# Patient Record
Sex: Female | Born: 1940
Health system: Southern US, Community
[De-identification: ages and names within clinical notes are randomized; demographics above are authoritative.]

## PROBLEM LIST (undated history)

## (undated) ENCOUNTER — Emergency Department (HOSPITAL_COMMUNITY): Disposition: A | Discharge status: Other Acute Inpatient Hospital | Payer: Medicare Other

## (undated) ENCOUNTER — Emergency Department (HOSPITAL_COMMUNITY): Discharge status: Against Medical Advice | Payer: Medicare Other

## (undated) DIAGNOSIS — E119 Type 2 diabetes mellitus without complications: Secondary | ICD-10-CM

## (undated) DIAGNOSIS — E871 Hypo-osmolality and hyponatremia: Secondary | ICD-10-CM

## (undated) DIAGNOSIS — I350 Nonrheumatic aortic (valve) stenosis: Secondary | ICD-10-CM

## (undated) DIAGNOSIS — E785 Hyperlipidemia, unspecified: Secondary | ICD-10-CM

## (undated) DIAGNOSIS — H269 Unspecified cataract: Secondary | ICD-10-CM

## (undated) DIAGNOSIS — D649 Anemia, unspecified: Secondary | ICD-10-CM

## (undated) DIAGNOSIS — I10 Essential (primary) hypertension: Secondary | ICD-10-CM

## (undated) DIAGNOSIS — I2510 Atherosclerotic heart disease of native coronary artery without angina pectoris: Secondary | ICD-10-CM

## (undated) DIAGNOSIS — R531 Weakness: Secondary | ICD-10-CM

## (undated) DIAGNOSIS — Z8679 Personal history of other diseases of the circulatory system: Secondary | ICD-10-CM

## (undated) DIAGNOSIS — H9190 Unspecified hearing loss, unspecified ear: Secondary | ICD-10-CM

## (undated) DIAGNOSIS — M1990 Unspecified osteoarthritis, unspecified site: Secondary | ICD-10-CM

## (undated) HISTORY — DX: Atherosclerotic heart disease of native coronary artery without angina pectoris: I25.10

## (undated) HISTORY — PX: KYPHOPLASTY: SHX5884

## (undated) HISTORY — DX: Personal history of other diseases of the circulatory system: Z86.79

---

## 1998-11-25 ENCOUNTER — Encounter: Payer: Self-pay | Admitting: Emergency Medicine

## 1998-11-25 ENCOUNTER — Emergency Department (HOSPITAL_COMMUNITY): Admission: EM | Admit: 1998-11-25 | Discharge: 1998-11-25 | Payer: Self-pay | Admitting: Emergency Medicine

## 1998-11-26 ENCOUNTER — Encounter: Payer: Self-pay | Admitting: Emergency Medicine

## 1998-11-30 ENCOUNTER — Ambulatory Visit (HOSPITAL_COMMUNITY): Admission: RE | Admit: 1998-11-30 | Discharge: 1998-11-30 | Payer: Self-pay | Admitting: *Deleted

## 1998-11-30 ENCOUNTER — Encounter: Payer: Self-pay | Admitting: *Deleted

## 2002-05-05 ENCOUNTER — Encounter: Admission: RE | Admit: 2002-05-05 | Discharge: 2002-05-05 | Payer: Self-pay | Admitting: Internal Medicine

## 2002-05-05 ENCOUNTER — Encounter: Payer: Self-pay | Admitting: Internal Medicine

## 2002-05-13 ENCOUNTER — Encounter: Payer: Self-pay | Admitting: *Deleted

## 2002-05-13 ENCOUNTER — Encounter: Admission: RE | Admit: 2002-05-13 | Discharge: 2002-05-13 | Payer: Self-pay | Admitting: *Deleted

## 2002-09-09 ENCOUNTER — Encounter: Payer: Self-pay | Admitting: Emergency Medicine

## 2002-09-09 ENCOUNTER — Emergency Department (HOSPITAL_COMMUNITY): Admission: EM | Admit: 2002-09-09 | Discharge: 2002-09-10 | Payer: Self-pay | Admitting: Emergency Medicine

## 2006-02-03 ENCOUNTER — Other Ambulatory Visit: Admission: RE | Admit: 2006-02-03 | Discharge: 2006-02-03 | Payer: Self-pay | Admitting: Internal Medicine

## 2006-02-11 ENCOUNTER — Encounter: Admission: RE | Admit: 2006-02-11 | Discharge: 2006-02-11 | Payer: Self-pay | Admitting: Internal Medicine

## 2007-04-30 ENCOUNTER — Encounter: Admission: RE | Admit: 2007-04-30 | Discharge: 2007-04-30 | Payer: Self-pay | Admitting: Internal Medicine

## 2008-05-17 ENCOUNTER — Encounter: Admission: RE | Admit: 2008-05-17 | Discharge: 2008-05-17 | Payer: Self-pay | Admitting: Internal Medicine

## 2009-05-31 ENCOUNTER — Encounter: Admission: RE | Admit: 2009-05-31 | Discharge: 2009-05-31 | Payer: Self-pay | Admitting: Internal Medicine

## 2012-10-19 ENCOUNTER — Other Ambulatory Visit: Payer: Self-pay | Admitting: Internal Medicine

## 2012-10-19 DIAGNOSIS — Z1231 Encounter for screening mammogram for malignant neoplasm of breast: Secondary | ICD-10-CM

## 2012-11-30 ENCOUNTER — Ambulatory Visit
Admission: RE | Admit: 2012-11-30 | Discharge: 2012-11-30 | Disposition: A | Discharge status: Home / Self Care | Payer: Medicare Other | Source: Ambulatory Visit | Attending: Internal Medicine | Admitting: Internal Medicine

## 2012-11-30 ENCOUNTER — Other Ambulatory Visit: Payer: Self-pay | Admitting: Internal Medicine

## 2012-11-30 DIAGNOSIS — Z1231 Encounter for screening mammogram for malignant neoplasm of breast: Secondary | ICD-10-CM

## 2013-05-14 ENCOUNTER — Other Ambulatory Visit: Payer: Self-pay | Admitting: Internal Medicine

## 2013-05-14 DIAGNOSIS — M545 Low back pain: Secondary | ICD-10-CM

## 2013-05-18 ENCOUNTER — Ambulatory Visit
Admission: RE | Admit: 2013-05-18 | Discharge: 2013-05-18 | Disposition: A | Discharge status: Home / Self Care | Payer: Medicare Other | Source: Ambulatory Visit | Attending: Internal Medicine | Admitting: Internal Medicine

## 2013-05-18 DIAGNOSIS — M545 Low back pain, unspecified: Secondary | ICD-10-CM

## 2013-06-04 ENCOUNTER — Other Ambulatory Visit (HOSPITAL_COMMUNITY): Payer: Self-pay | Admitting: Internal Medicine

## 2013-06-04 DIAGNOSIS — IMO0002 Reserved for concepts with insufficient information to code with codable children: Secondary | ICD-10-CM

## 2013-06-04 DIAGNOSIS — M549 Dorsalgia, unspecified: Secondary | ICD-10-CM

## 2013-06-09 ENCOUNTER — Ambulatory Visit (HOSPITAL_COMMUNITY)
Admission: RE | Admit: 2013-06-09 | Discharge: 2013-06-09 | Disposition: A | Discharge status: Home / Self Care | Payer: Medicare Other | Source: Ambulatory Visit | Attending: Internal Medicine | Admitting: Internal Medicine

## 2013-06-09 DIAGNOSIS — M549 Dorsalgia, unspecified: Secondary | ICD-10-CM

## 2013-06-09 DIAGNOSIS — IMO0002 Reserved for concepts with insufficient information to code with codable children: Secondary | ICD-10-CM

## 2013-07-07 ENCOUNTER — Other Ambulatory Visit (HOSPITAL_COMMUNITY): Payer: Self-pay | Admitting: Interventional Radiology

## 2013-07-07 DIAGNOSIS — M549 Dorsalgia, unspecified: Secondary | ICD-10-CM

## 2013-07-07 DIAGNOSIS — IMO0002 Reserved for concepts with insufficient information to code with codable children: Secondary | ICD-10-CM

## 2013-07-08 ENCOUNTER — Other Ambulatory Visit: Payer: Self-pay | Admitting: Radiology

## 2013-07-09 ENCOUNTER — Ambulatory Visit (HOSPITAL_COMMUNITY)
Admission: RE | Admit: 2013-07-09 | Discharge: 2013-07-09 | Disposition: A | Discharge status: Home / Self Care | Payer: Medicare Other | Source: Ambulatory Visit | Attending: Interventional Radiology | Admitting: Interventional Radiology

## 2013-07-09 ENCOUNTER — Encounter (HOSPITAL_COMMUNITY): Payer: Self-pay

## 2013-07-09 DIAGNOSIS — I10 Essential (primary) hypertension: Secondary | ICD-10-CM | POA: Insufficient documentation

## 2013-07-09 DIAGNOSIS — M810 Age-related osteoporosis without current pathological fracture: Secondary | ICD-10-CM | POA: Insufficient documentation

## 2013-07-09 DIAGNOSIS — H269 Unspecified cataract: Secondary | ICD-10-CM | POA: Insufficient documentation

## 2013-07-09 DIAGNOSIS — IMO0002 Reserved for concepts with insufficient information to code with codable children: Secondary | ICD-10-CM

## 2013-07-09 DIAGNOSIS — E119 Type 2 diabetes mellitus without complications: Secondary | ICD-10-CM | POA: Insufficient documentation

## 2013-07-09 DIAGNOSIS — M549 Dorsalgia, unspecified: Secondary | ICD-10-CM

## 2013-07-09 DIAGNOSIS — M129 Arthropathy, unspecified: Secondary | ICD-10-CM | POA: Insufficient documentation

## 2013-07-09 DIAGNOSIS — E785 Hyperlipidemia, unspecified: Secondary | ICD-10-CM | POA: Insufficient documentation

## 2013-07-09 DIAGNOSIS — M8448XA Pathological fracture, other site, initial encounter for fracture: Secondary | ICD-10-CM | POA: Insufficient documentation

## 2013-07-09 HISTORY — DX: Unspecified cataract: H26.9

## 2013-07-09 HISTORY — DX: Hyperlipidemia, unspecified: E78.5

## 2013-07-09 HISTORY — DX: Unspecified osteoarthritis, unspecified site: M19.90

## 2013-07-09 LAB — BASIC METABOLIC PANEL
BUN: 7 mg/dL (ref 6–23)
Calcium: 9.4 mg/dL (ref 8.4–10.5)
Creatinine, Ser: 0.51 mg/dL (ref 0.50–1.10)
GFR calc Af Amer: >90 mL/min (ref >90)
GFR calc non Af Amer: >90 mL/min (ref >90)

## 2013-07-09 LAB — PROTIME-INR
INR: 1.01 (ref 0.00–1.49)
Prothrombin Time: 13.1 seconds (ref 11.6–15.2)

## 2013-07-09 LAB — CBC
MCH: 27 pg (ref 26.0–34.0)
MCHC: 35.9 g/dL (ref 30.0–36.0)
RDW: 13.3 % (ref 11.5–15.5)

## 2013-07-09 MED ORDER — MIDAZOLAM HCL 2 MG/2ML IJ SOLN
INTRAMUSCULAR | Status: AC | PRN
  Administered 2013-07-09: 1 mg via INTRAVENOUS
  Administered 2013-07-09: 0.5 mg via INTRAVENOUS

## 2013-07-09 MED ORDER — SODIUM CHLORIDE 0.9 % IV SOLN: INTRAVENOUS | Status: AC

## 2013-07-09 MED ORDER — IOHEXOL 300 MG/ML  SOLN
50.0000 mL | Freq: Once | INTRAMUSCULAR | Status: AC | PRN
  Administered 2013-07-09: 1 mL

## 2013-07-09 MED ORDER — FENTANYL CITRATE 0.05 MG/ML IJ SOLN
INTRAMUSCULAR | Status: AC | PRN
  Administered 2013-07-09: 25 ug via INTRAVENOUS
  Administered 2013-07-09: 12.5 ug via INTRAVENOUS

## 2013-07-09 MED ORDER — SODIUM CHLORIDE 0.9 % IV SOLN
Freq: Once | INTRAVENOUS | Status: AC
  Administered 2013-07-09: 08:00:00 via INTRAVENOUS

## 2013-07-09 MED ORDER — DARBEPOETIN ALFA-POLYSORBATE 40 MCG/0.4ML IJ SOLN
INTRAMUSCULAR | Status: AC
  Filled 2013-07-09: qty 0.4

## 2013-07-09 MED ORDER — CEFAZOLIN SODIUM-DEXTROSE 2-3 GM-% IV SOLR
2.0000 g | Freq: Once | INTRAVENOUS | Status: AC
  Administered 2013-07-09: 2 g via INTRAVENOUS
  Filled 2013-07-09: qty 50

## 2013-07-09 NOTE — H&P (Signed)
Amy Hood is an 72 y.o. female.   Chief Complaint: back pain x 2 mos Denies injury MRI reveals new fracture Lumbar 1 Consulted with Dr Corliss Skains last week Scheduled now for L1 kyphoplasty HPI: DM; HTN; HLD; osteoporosis; cataracts  Past Medical History  Diagnosis Date  . Hypertension   . Diabetes mellitus without complication   . Arthritis   . Hyperlipidemia   . Cataracts, bilateral     No past surgical history on file.  No family history on file. Social History:  has no tobacco, alcohol, and drug history on file.  Allergies: No Known Allergies   (Not in a hospital admission)  Results for orders placed during the hospital encounter of 07/09/13 (from the past 48 hour(s))  GLUCOSE, CAPILLARY     Status: Abnormal   Collection Time    07/09/13  7:14 AM      Result Value Range   Glucose-Capillary 204 (*) 70 - 99 mg/dL   Comment 1 Notify RN     Comment 2 Documented in Chart    APTT     Status: None   Collection Time    07/09/13  7:38 AM      Result Value Range   aPTT 29  24 - 37 seconds  CBC     Status: Abnormal   Collection Time    07/09/13  7:38 AM      Result Value Range   WBC 9.0  4.0 - 10.5 K/uL   RBC 4.93  3.87 - 5.11 MIL/uL   Hemoglobin 13.3  12.0 - 15.0 g/dL   HCT 81.1  91.4 - 78.2 %   MCV 75.1 (*) 78.0 - 100.0 fL   MCH 27.0  26.0 - 34.0 pg   MCHC 35.9  30.0 - 36.0 g/dL   RDW 95.6  21.3 - 08.6 %   Platelets 280  150 - 400 K/uL  PROTIME-INR     Status: None   Collection Time    07/09/13  7:38 AM      Result Value Range   Prothrombin Time 13.1  11.6 - 15.2 seconds   INR 1.01  0.00 - 1.49   No results found.  Review of Systems  Constitutional: Negative for fever.  Respiratory: Negative for shortness of breath.   Cardiovascular: Negative for chest pain.  Gastrointestinal: Negative for nausea, vomiting and abdominal pain.  Musculoskeletal: Positive for back pain.  Neurological: Positive for weakness. Negative for headaches.    Blood pressure  177/84, pulse 82, temperature 97.7 F (36.5 C), temperature source Oral, resp. rate 20, SpO2 95.00%. Physical Exam  Constitutional: She is oriented to person, place, and time. She appears well-developed and well-nourished.  Cardiovascular: Normal rate, regular rhythm and normal heart sounds.   No murmur heard. Respiratory: Effort normal and breath sounds normal. She has no wheezes.  GI: Soft. Bowel sounds are normal. There is no tenderness.  Musculoskeletal: Normal range of motion.  Back pain  Neurological: She is alert and oriented to person, place, and time. Coordination normal.  Skin: Skin is warm and dry.  Psychiatric: She has a normal mood and affect. Her behavior is normal. Judgment and thought content normal.     Assessment/Plan Back pain x 2 mos MRI shows L1 fx Scheduled for L1 KP Pt and family aware of procedure benefits and risks and agreeable to proceed Consent signed and in chart  Holdan Stucke A 07/09/2013, 8:31 AM

## 2013-07-09 NOTE — Procedures (Signed)
S/P L1 KP 

## 2013-07-09 NOTE — ED Notes (Signed)
Patient denies pain and is resting comfortably, speaking with interpreter

## 2013-07-12 ENCOUNTER — Other Ambulatory Visit (HOSPITAL_COMMUNITY): Payer: Self-pay | Admitting: Interventional Radiology

## 2013-07-12 DIAGNOSIS — IMO0002 Reserved for concepts with insufficient information to code with codable children: Secondary | ICD-10-CM

## 2013-07-12 DIAGNOSIS — M549 Dorsalgia, unspecified: Secondary | ICD-10-CM

## 2013-07-23 ENCOUNTER — Ambulatory Visit (HOSPITAL_COMMUNITY)
Admission: RE | Admit: 2013-07-23 | Discharge: 2013-07-23 | Disposition: A | Discharge status: Home / Self Care | Payer: Medicare Other | Source: Ambulatory Visit | Attending: Interventional Radiology | Admitting: Interventional Radiology

## 2013-07-23 ENCOUNTER — Other Ambulatory Visit (HOSPITAL_COMMUNITY): Payer: Self-pay | Admitting: Interventional Radiology

## 2013-07-23 DIAGNOSIS — IMO0002 Reserved for concepts with insufficient information to code with codable children: Secondary | ICD-10-CM

## 2013-07-23 DIAGNOSIS — M549 Dorsalgia, unspecified: Secondary | ICD-10-CM

## 2013-07-23 DIAGNOSIS — M545 Low back pain, unspecified: Secondary | ICD-10-CM | POA: Insufficient documentation

## 2013-07-23 DIAGNOSIS — X58XXXA Exposure to other specified factors, initial encounter: Secondary | ICD-10-CM | POA: Insufficient documentation

## 2013-07-23 DIAGNOSIS — M5146 Schmorl's nodes, lumbar region: Secondary | ICD-10-CM | POA: Insufficient documentation

## 2013-07-23 DIAGNOSIS — M5126 Other intervertebral disc displacement, lumbar region: Secondary | ICD-10-CM | POA: Insufficient documentation

## 2013-07-23 DIAGNOSIS — S32009A Unspecified fracture of unspecified lumbar vertebra, initial encounter for closed fracture: Secondary | ICD-10-CM | POA: Insufficient documentation

## 2013-07-23 MED ORDER — OXYCODONE-ACETAMINOPHEN 5-325 MG PO TABS: 1.0000 | ORAL_TABLET | Freq: Once | ORAL | Status: DC

## 2013-07-23 MED ORDER — OXYCODONE-ACETAMINOPHEN 5-325 MG PO TABS
ORAL_TABLET | ORAL | Status: AC
  Administered 2013-07-23: 1
  Filled 2013-07-23: qty 1

## 2013-08-10 ENCOUNTER — Other Ambulatory Visit: Payer: Self-pay | Admitting: Radiology

## 2013-08-10 ENCOUNTER — Other Ambulatory Visit (HOSPITAL_COMMUNITY): Payer: Self-pay | Admitting: Interventional Radiology

## 2013-08-10 DIAGNOSIS — IMO0002 Reserved for concepts with insufficient information to code with codable children: Secondary | ICD-10-CM

## 2013-08-10 DIAGNOSIS — M549 Dorsalgia, unspecified: Secondary | ICD-10-CM

## 2013-08-13 ENCOUNTER — Ambulatory Visit (HOSPITAL_COMMUNITY)
Admission: RE | Admit: 2013-08-13 | Discharge: 2013-08-13 | Disposition: A | Discharge status: Home / Self Care | Payer: Medicare Other | Source: Ambulatory Visit | Attending: Interventional Radiology | Admitting: Interventional Radiology

## 2013-08-13 ENCOUNTER — Encounter (HOSPITAL_COMMUNITY): Payer: Self-pay

## 2013-08-13 DIAGNOSIS — I10 Essential (primary) hypertension: Secondary | ICD-10-CM | POA: Insufficient documentation

## 2013-08-13 DIAGNOSIS — E785 Hyperlipidemia, unspecified: Secondary | ICD-10-CM | POA: Insufficient documentation

## 2013-08-13 DIAGNOSIS — IMO0002 Reserved for concepts with insufficient information to code with codable children: Secondary | ICD-10-CM

## 2013-08-13 DIAGNOSIS — E119 Type 2 diabetes mellitus without complications: Secondary | ICD-10-CM | POA: Insufficient documentation

## 2013-08-13 DIAGNOSIS — M8448XA Pathological fracture, other site, initial encounter for fracture: Secondary | ICD-10-CM | POA: Insufficient documentation

## 2013-08-13 DIAGNOSIS — M549 Dorsalgia, unspecified: Secondary | ICD-10-CM

## 2013-08-13 DIAGNOSIS — M129 Arthropathy, unspecified: Secondary | ICD-10-CM | POA: Insufficient documentation

## 2013-08-13 LAB — CBC WITH DIFFERENTIAL/PLATELET
Basophils Absolute: 0 10*3/uL (ref 0.0–0.1)
Basophils Relative: 0 % (ref 0–1)
Hemoglobin: 11.8 g/dL — ABNORMAL LOW (ref 12.0–15.0)
MCHC: 34.5 g/dL (ref 30.0–36.0)
Neutro Abs: 3.7 10*3/uL (ref 1.7–7.7)
Neutrophils Relative %: 65 % (ref 43–77)
RDW: 14.2 % (ref 11.5–15.5)

## 2013-08-13 LAB — GLUCOSE, CAPILLARY: Glucose-Capillary: 93 mg/dL (ref 70–99)

## 2013-08-13 LAB — APTT: aPTT: 32 seconds (ref 24–37)

## 2013-08-13 LAB — PROTIME-INR: INR: 1 (ref 0.00–1.49)

## 2013-08-13 MED ORDER — FENTANYL CITRATE 0.05 MG/ML IJ SOLN
INTRAMUSCULAR | Status: AC
  Filled 2013-08-13: qty 2

## 2013-08-13 MED ORDER — HYDROMORPHONE HCL PF 1 MG/ML IJ SOLN
INTRAMUSCULAR | Status: DC | PRN
  Administered 2013-08-13: 0.5 mg

## 2013-08-13 MED ORDER — MIDAZOLAM HCL 2 MG/2ML IJ SOLN
INTRAMUSCULAR | Status: AC
  Filled 2013-08-13: qty 2

## 2013-08-13 MED ORDER — SODIUM CHLORIDE 0.9 % IV SOLN: INTRAVENOUS | Status: AC

## 2013-08-13 MED ORDER — CEFAZOLIN SODIUM-DEXTROSE 2-3 GM-% IV SOLR
2.0000 g | Freq: Once | INTRAVENOUS | Status: AC
  Administered 2013-08-13: 2 g via INTRAVENOUS

## 2013-08-13 MED ORDER — SODIUM CHLORIDE 0.9 % IV SOLN
Freq: Once | INTRAVENOUS | Status: AC
  Administered 2013-08-13: 11:00:00 via INTRAVENOUS

## 2013-08-13 MED ORDER — MIDAZOLAM HCL 2 MG/2ML IJ SOLN
INTRAMUSCULAR | Status: DC | PRN
  Administered 2013-08-13 (×3): 1 mg via INTRAVENOUS

## 2013-08-13 MED ORDER — CEFAZOLIN SODIUM-DEXTROSE 2-3 GM-% IV SOLR
INTRAVENOUS | Status: AC
  Filled 2013-08-13: qty 50

## 2013-08-13 MED ORDER — MIDAZOLAM HCL 2 MG/2ML IJ SOLN
INTRAMUSCULAR | Status: AC
  Filled 2013-08-13: qty 4

## 2013-08-13 MED ORDER — FENTANYL CITRATE 0.05 MG/ML IJ SOLN
INTRAMUSCULAR | Status: DC | PRN
  Administered 2013-08-13 (×2): 25 ug via INTRAVENOUS

## 2013-08-13 MED ORDER — TOBRAMYCIN SULFATE 1.2 G IJ SOLR
INTRAMUSCULAR | Status: AC
  Filled 2013-08-13: qty 1.2

## 2013-08-13 MED ORDER — HYDROMORPHONE HCL PF 1 MG/ML IJ SOLN
INTRAMUSCULAR | Status: AC
  Filled 2013-08-13: qty 2

## 2013-08-13 NOTE — Procedures (Signed)
S/P L2 KP for painful compression fracture.

## 2013-08-13 NOTE — H&P (Signed)
Amy Hood is an 72 y.o. female.   Chief Complaint: new back pain Pt has hx Lumbar 1 kyphoplasty 07/09/13 Pain was gone for over 1 week but then new onset. MRI performed 8/15 reveals new L2 fx Pt scheduled now for Lumbar #2 kyphoplasty  Also complains of dizziness She is taking Vicodin every 3 hrs for pain I explained she should be using only every 6 hrs- may be contributing to pain If dizziness continues she needs to see primary MD Husband has good understanding HPI: HTN; DM; HLD; arthritis  Past Medical History  Diagnosis Date  . Hypertension   . Diabetes mellitus without complication   . Arthritis   . Hyperlipidemia   . Cataracts, bilateral     History reviewed. No pertinent past surgical history.  No family history on file. Social History:  reports that she has never smoked. She does not have any smokeless tobacco history on file. Her alcohol and drug histories are not on file.  Allergies: No Known Allergies   (Not in a hospital admission)  No results found for this or any previous visit (from the past 48 hour(s)). No results found.  Review of Systems  Constitutional: Negative for fever and weight loss.  Respiratory: Negative for shortness of breath.   Cardiovascular: Negative for chest pain.  Gastrointestinal: Negative for nausea and vomiting.  Musculoskeletal: Positive for back pain.  Neurological: Positive for dizziness and weakness. Negative for headaches.    Blood pressure 175/64, pulse 81, temperature 98.2 F (36.8 C), temperature source Oral, resp. rate 18, weight 142 lb (64.411 kg), SpO2 100.00%. Physical Exam  Constitutional: She is oriented to person, place, and time. She appears well-nourished.  Cardiovascular: Normal rate, regular rhythm and normal heart sounds.   No murmur heard. Respiratory: Effort normal and breath sounds normal. She has no wheezes.  GI: Soft. Bowel sounds are normal. There is no tenderness.  Musculoskeletal: Normal range of  motion. She exhibits tenderness.  Low back pain  Neurological: She is alert and oriented to person, place, and time.  Speaks only [place] Used phone interpreter for consent  Skin: Skin is warm.  Psychiatric: She has a normal mood and affect. Her behavior is normal. Judgment and thought content normal.     Assessment/Plan New Low back pain x 3 weeks Hx L1 KP 07/09/13- successful- good pain relief New back pain 1 week later MRI on 8/15 revealed new L2 fx Scheduled now for L2 KP Pt and husband aware of procedure benefits and risks and agreeable to proceed Consent signed via interpreter over phone   Quoc Tome A 08/13/2013, 11:02 AM

## 2013-08-19 ENCOUNTER — Other Ambulatory Visit (HOSPITAL_COMMUNITY): Payer: Self-pay | Admitting: Interventional Radiology

## 2013-08-19 DIAGNOSIS — IMO0002 Reserved for concepts with insufficient information to code with codable children: Secondary | ICD-10-CM

## 2013-08-19 DIAGNOSIS — M549 Dorsalgia, unspecified: Secondary | ICD-10-CM

## 2013-08-27 ENCOUNTER — Ambulatory Visit (HOSPITAL_COMMUNITY)
Admission: RE | Admit: 2013-08-27 | Discharge: 2013-08-27 | Disposition: A | Discharge status: Home / Self Care | Payer: Medicare Other | Source: Ambulatory Visit | Attending: Interventional Radiology | Admitting: Interventional Radiology

## 2013-08-27 DIAGNOSIS — M549 Dorsalgia, unspecified: Secondary | ICD-10-CM

## 2013-08-27 DIAGNOSIS — IMO0002 Reserved for concepts with insufficient information to code with codable children: Secondary | ICD-10-CM

## 2013-09-18 ENCOUNTER — Encounter (HOSPITAL_COMMUNITY): Payer: Self-pay | Admitting: Emergency Medicine

## 2013-09-18 ENCOUNTER — Emergency Department (HOSPITAL_COMMUNITY): Payer: Medicare Other

## 2013-09-18 ENCOUNTER — Inpatient Hospital Stay (HOSPITAL_COMMUNITY)
Admission: EM | Admit: 2013-09-18 | Discharge: 2013-09-21 | DRG: 917 | Disposition: A | Discharge status: Home / Self Care | Payer: Medicare Other | Attending: Internal Medicine | Admitting: Internal Medicine

## 2013-09-18 DIAGNOSIS — IMO0002 Reserved for concepts with insufficient information to code with codable children: Secondary | ICD-10-CM | POA: Diagnosis present

## 2013-09-18 DIAGNOSIS — T68XXXA Hypothermia, initial encounter: Secondary | ICD-10-CM | POA: Diagnosis present

## 2013-09-18 DIAGNOSIS — S32050A Wedge compression fracture of fifth lumbar vertebra, initial encounter for closed fracture: Secondary | ICD-10-CM

## 2013-09-18 DIAGNOSIS — T50991A Poisoning by other drugs, medicaments and biological substances, accidental (unintentional), initial encounter: Principal | ICD-10-CM | POA: Diagnosis present

## 2013-09-18 DIAGNOSIS — G8929 Other chronic pain: Secondary | ICD-10-CM | POA: Diagnosis present

## 2013-09-18 DIAGNOSIS — G9341 Metabolic encephalopathy: Secondary | ICD-10-CM | POA: Diagnosis present

## 2013-09-18 DIAGNOSIS — E876 Hypokalemia: Secondary | ICD-10-CM

## 2013-09-18 DIAGNOSIS — R45851 Suicidal ideations: Secondary | ICD-10-CM

## 2013-09-18 DIAGNOSIS — X31XXXA Exposure to excessive natural cold, initial encounter: Secondary | ICD-10-CM | POA: Diagnosis present

## 2013-09-18 DIAGNOSIS — I959 Hypotension, unspecified: Secondary | ICD-10-CM | POA: Diagnosis present

## 2013-09-18 DIAGNOSIS — M8448XA Pathological fracture, other site, initial encounter for fracture: Secondary | ICD-10-CM | POA: Diagnosis present

## 2013-09-18 DIAGNOSIS — Z79899 Other long term (current) drug therapy: Secondary | ICD-10-CM

## 2013-09-18 DIAGNOSIS — M5416 Radiculopathy, lumbar region: Secondary | ICD-10-CM

## 2013-09-18 DIAGNOSIS — Z7982 Long term (current) use of aspirin: Secondary | ICD-10-CM

## 2013-09-18 DIAGNOSIS — E1169 Type 2 diabetes mellitus with other specified complication: Secondary | ICD-10-CM | POA: Diagnosis present

## 2013-09-18 DIAGNOSIS — I10 Essential (primary) hypertension: Secondary | ICD-10-CM | POA: Diagnosis present

## 2013-09-18 DIAGNOSIS — T39094A Poisoning by salicylates, undetermined, initial encounter: Secondary | ICD-10-CM | POA: Diagnosis present

## 2013-09-18 DIAGNOSIS — D649 Anemia, unspecified: Secondary | ICD-10-CM | POA: Diagnosis present

## 2013-09-18 DIAGNOSIS — M129 Arthropathy, unspecified: Secondary | ICD-10-CM | POA: Diagnosis present

## 2013-09-18 DIAGNOSIS — T394X2A Poisoning by antirheumatics, not elsewhere classified, intentional self-harm, initial encounter: Secondary | ICD-10-CM | POA: Diagnosis present

## 2013-09-18 DIAGNOSIS — E785 Hyperlipidemia, unspecified: Secondary | ICD-10-CM | POA: Diagnosis present

## 2013-09-18 DIAGNOSIS — G9340 Encephalopathy, unspecified: Secondary | ICD-10-CM | POA: Diagnosis present

## 2013-09-18 DIAGNOSIS — M810 Age-related osteoporosis without current pathological fracture: Secondary | ICD-10-CM | POA: Diagnosis present

## 2013-09-18 DIAGNOSIS — E162 Hypoglycemia, unspecified: Secondary | ICD-10-CM | POA: Diagnosis present

## 2013-09-18 DIAGNOSIS — T50992A Poisoning by other drugs, medicaments and biological substances, intentional self-harm, initial encounter: Secondary | ICD-10-CM | POA: Diagnosis present

## 2013-09-18 LAB — COMPREHENSIVE METABOLIC PANEL
ALT: 6 U/L (ref 0–35)
Albumin: 3.3 g/dL — ABNORMAL LOW (ref 3.5–5.2)
Alkaline Phosphatase: 76 U/L (ref 39–117)
BUN: 8 mg/dL (ref 6–23)
Calcium: 8.2 mg/dL — ABNORMAL LOW (ref 8.4–10.5)
Creatinine, Ser: 0.49 mg/dL — ABNORMAL LOW (ref 0.50–1.10)
GFR calc Af Amer: >90 mL/min (ref >90)
Glucose, Bld: 111 mg/dL — ABNORMAL HIGH (ref 70–99)
Potassium: 2.9 mEq/L — ABNORMAL LOW (ref 3.5–5.1)
Sodium: 137 mEq/L (ref 135–145)
Total Bilirubin: 0.2 mg/dL — ABNORMAL LOW (ref 0.3–1.2)
Total Protein: 7.8 g/dL (ref 6.0–8.3)

## 2013-09-18 LAB — URINALYSIS, ROUTINE W REFLEX MICROSCOPIC
Glucose, UA: 500 mg/dL — AB
Hgb urine dipstick: NEGATIVE
Ketones, ur: NEGATIVE mg/dL
Leukocytes, UA: NEGATIVE
Protein, ur: NEGATIVE mg/dL
pH: 6.5 (ref 5.0–8.0)

## 2013-09-18 LAB — POCT I-STAT 3, ART BLOOD GAS (G3+)
Acid-base deficit: 2 mmol/L (ref 0.0–2.0)
Bicarbonate: 22.6 mEq/L (ref 20.0–24.0)
O2 Saturation: 100 %
Patient temperature: 98.6
pH, Arterial: 7.397 (ref 7.350–7.450)

## 2013-09-18 LAB — CBC
Hemoglobin: 10.9 g/dL — ABNORMAL LOW (ref 12.0–15.0)
MCH: 26.6 pg (ref 26.0–34.0)
MCV: 76.3 fL — ABNORMAL LOW (ref 78.0–100.0)
RBC: 4.1 MIL/uL (ref 3.87–5.11)
WBC: 7.5 10*3/uL (ref 4.0–10.5)

## 2013-09-18 LAB — RAPID URINE DRUG SCREEN, HOSP PERFORMED
Barbiturates: NOT DETECTED
Cocaine: NOT DETECTED
Opiates: POSITIVE — AB
Tetrahydrocannabinol: NOT DETECTED

## 2013-09-18 LAB — URINE MICROSCOPIC-ADD ON

## 2013-09-18 LAB — GLUCOSE, CAPILLARY
Glucose-Capillary: 132 mg/dL — ABNORMAL HIGH (ref 70–99)
Glucose-Capillary: 27 mg/dL — CL (ref 70–99)
Glucose-Capillary: 82 mg/dL (ref 70–99)

## 2013-09-18 LAB — SALICYLATE LEVEL: Salicylate Lvl: 9 mg/dL (ref 2.8–20.0)

## 2013-09-18 MED ORDER — ETOMIDATE 2 MG/ML IV SOLN
INTRAVENOUS | Status: AC | PRN
  Administered 2013-09-18: 20 mg via INTRAVENOUS

## 2013-09-18 MED ORDER — FENTANYL CITRATE 0.05 MG/ML IJ SOLN
50.0000 ug | Freq: Once | INTRAMUSCULAR | Status: AC
  Administered 2013-09-18: 50 ug via INTRAVENOUS
  Filled 2013-09-18: qty 2

## 2013-09-18 MED ORDER — DEXTROSE 50 % IV SOLN
INTRAVENOUS | Status: AC
  Filled 2013-09-18: qty 50

## 2013-09-18 MED ORDER — MIDAZOLAM HCL 5 MG/ML IJ SOLN
2.0000 mg/h | INTRAMUSCULAR | Status: DC
  Administered 2013-09-18: 2 mg/h via INTRAVENOUS
  Filled 2013-09-18: qty 10

## 2013-09-18 MED ORDER — DEXTROSE 5 % IV SOLN
1.0000 g | Freq: Once | INTRAVENOUS | Status: AC
  Administered 2013-09-18: 1 g via INTRAVENOUS
  Filled 2013-09-18: qty 10

## 2013-09-18 MED ORDER — ROCURONIUM BROMIDE 50 MG/5ML IV SOLN
INTRAVENOUS | Status: AC | PRN
  Administered 2013-09-18: 100 mg via INTRAVENOUS

## 2013-09-18 MED ORDER — DEXTROSE 50 % IV SOLN
1.0000 | Freq: Once | INTRAVENOUS | Status: AC
  Administered 2013-09-18: 50 mL via INTRAVENOUS

## 2013-09-18 NOTE — ED Provider Notes (Signed)
CSN: [id number]     Arrival date & time 09/18/13  1915 History   First MD Initiated Contact with Patient 09/18/13 1919     Chief Complaint  Patient presents with  . Respiratory Distress   (Consider location/radiation/quality/duration/timing/severity/associated sxs/prior Treatment) HPI Comments: 72 y/o female with history of DM presenting with altered mental status. Per husband, she was in her normal state of health today. She recently had back surgery and had been in pain since her PCP decreased her narcotics. He had been checking on her in bed throughout the day. He checked on her and found her to be combative and stating that she wanted to die. He called EMS. She became increasingly combative and altered during transport. On arrival, she was found to be in respiratory distress and unable to protect her airway. She was intubated for airway protection by Dr. Radford Pax. Her husband is unsure if she took too much of her norco but states that the number of pills in the bottle is the same as it was Friday.   The history is provided by the spouse and the EMS personnel. No language interpreter was used.    Past Medical History  Diagnosis Date  . Hypertension   . Diabetes mellitus without complication   . Arthritis   . Hyperlipidemia   . Cataracts, bilateral    History reviewed. No pertinent past surgical history. No family history on file. History  Substance Use Topics  . Smoking status: Never Smoker   . Smokeless tobacco: Not on file  . Alcohol Use: Not on file   OB History   Grav Para Term Preterm Abortions TAB SAB Ect Mult Living                 Review of Systems  Unable to perform ROS   Allergies  Review of patient's allergies indicates no known allergies.  Home Medications   Current Outpatient Rx  Name  Route  Sig  Dispense  Refill  . ALENDRONATE SODIUM PO   Oral   Take by mouth.         Marland Kitchen aspirin EC 81 MG tablet   Oral   Take 81 mg by mouth daily.         .  Celecoxib (CELEBREX PO)   Oral   Take by mouth.         . Cholecalciferol (VITAMIN D PO)   Oral   Take by mouth.         . Cyclobenzaprine HCl (FLEXERIL PO)   Oral   Take by mouth.         Marland Kitchen GLIMEPIRIDE PO   Oral   Take by mouth.         Marland Kitchen ibuprofen (ADVIL) 200 MG tablet   Oral   Take 200 mg by mouth every 6 (six) hours as needed for pain.         Marland Kitchen lovastatin (MEVACOR) 40 MG tablet   Oral   Take 40 mg by mouth at bedtime.         Marland Kitchen OVER THE COUNTER MEDICATION      Calcium         . pioglitazone (ACTOS) 45 MG tablet   Oral   Take 45 mg by mouth daily.         . Tramadol-Acetaminophen (ULTRACET PO)   Oral   Take by mouth.          BP 173/75  Pulse 98  Temp(Src) 94.9 F (34.9  C) (Rectal)  Resp 20  SpO2 100% Physical Exam  Vitals reviewed. Constitutional: She appears well-developed and well-nourished.  HENT:  Head: Atraumatic.  Eyes:  Pupils 4 mm, sluggishly reactive, round  Cardiovascular: Normal rate, regular rhythm, normal heart sounds and intact distal pulses.   Pulmonary/Chest: Breath sounds normal. She has no wheezes. She has no rales.  Abdominal: Soft. Bowel sounds are normal. She exhibits no distension.  Neurological: She is unresponsive.  Skin: Skin is warm and dry.    ED Course  Procedures (including critical care time) Labs Review Labs Reviewed  CBC  COMPREHENSIVE METABOLIC PANEL  URINALYSIS, ROUTINE W REFLEX MICROSCOPIC  ACETAMINOPHEN LEVEL  SALICYLATE LEVEL  URINE RAPID DRUG SCREEN (HOSP PERFORMED)  TSH   Imaging Review Ct Head Wo Contrast (only If Suspected Head Trauma And/or Pt Is On Anticoagulant)  09/18/2013   CLINICAL DATA:  Respiratory distress.  EXAM: CT HEAD WITHOUT CONTRAST  TECHNIQUE: Contiguous axial images were obtained from the base of the skull through the vertex without intravenous contrast.  COMPARISON:  None.  FINDINGS: Bony calvarium appears intact. Mild bilateral ethmoid sinusitis is noted. Minimal  diffuse cortical atrophy is noted. Ventricular size is within normal limits. No mass effect or midline shift is noted. There is no evidence of mass lesion, hemorrhage or acute infarction.  IMPRESSION: Minimal diffuse cortical atrophy. Mild bilateral ethmoid sinusitis. No acute intracranial abnormality seen.   Electronically Signed   By: Roque Lias M.D.   On: 09/18/2013 21:18   Mr Lumbar Spine Wo Contrast  09/20/2013   CLINICAL DATA:  Low back pain.  EXAM: MRI LUMBAR SPINE WITHOUT CONTRAST  TECHNIQUE: Multiplanar, multisequence MR imaging was performed. No intravenous contrast was administered.  COMPARISON:  07/23/2013  FINDINGS: New marrow edema within the mildly compressed L5 vertebral body, with height loss approximately 25%. No significant bony retropulsion. No evidence of posterior element fracturing or subluxation. No significant neighboring disc edema or endplate erosion. There is heterogeneous signal intensity material in the ventral epidural space at the level of the L5, extending inferiorly to the level of the S1, likely combination of venous epidural engorgement and epidural hemorrhage.  No further compression of recently treated L2 compression fracture. Methylmethacrylate appears central to the vertebral body.  Unchanged appearance of remote L1 compression fracture, status post bone augmentation. Retropulsion is again noted, most notable central and left paracentral, with crowding of the left lateral recess and left foramen. Height loss at this level appear similar to prior, nearly 50%.  Normal conus signal and morphology. Ventral CSF is less well seen in the mid lumbar region, particularly at the level of L3 and L4, which may be related to flow disturbance or epidural venous engorgement.  No new degenerative changes. There are multiple small disc bulges and herniations, none causing advanced canal or foraminal stenosis. The most notable is again seen on the left at L5-S1, where a small disk  herniation contacts the inferior L5 nerve root within the foramen.  T2 hyperintense masses within the right kidney are stable from prior, measuring up to 2 cm in the interpolar region. These most likely represent cysts.  IMPRESSION: 1. Acute/unhealed L5 compression fracture, new from 07/23/2013. No significant height loss or bony retropulsion. 2. Treated L1 and L2 compression fractures. Bony retropulsion at L1 is stable from prior, causing left lateral recess and foraminal stenosis.   Electronically Signed   By: Tiburcio Pea M.D.   On: 09/20/2013 01:39   Dg Chest Portable 1 View  09/18/2013  CLINICAL DATA:  Intubated. Overdose.  EXAM: PORTABLE CHEST - 1 VIEW  COMPARISON:  None.  FINDINGS: Borderline enlarged cardiac silhouette. Endotracheal tube in satisfactory position. Mildly prominent interstitial markings. Minimal left basilar opacity. Diffuse osteopenia. Old, healed right rib fracture.  IMPRESSION: 1. Endotracheal tube in satisfactory position. 2. Mild left basilar atelectasis and possible aspiration pneumonitis. 3. Mild did chronic interstitial lung disease.   Electronically Signed   By: Gordan Payment M.D.   On: 09/18/2013 20:06    EKG Interpretation   None       MDM   1. Acute encephalopathy   2. Hypoglycemia   3. Hypokalemia   4. Hypotension    72 y/o female with history of DM, chronic back pain presenting with AMS. Husband called EMS after she became combative and confused. He was concerned that she overdosed on one of her medications as she was stating she wanted to die due to her pain not being controlled. He reports all of her medications are accounted for though. Mental status deteriorated during transportation. She arrived to E pod and was intubated for airway protection. BS then checked and found to be 27. D 50 given with improvement in BS and no further hypoglycemic episodes. CXR without acute cardiopulmonary disease. CT head unremarkable. Labs remarkable for hypokalemia and  bacteria in urine. UDS positive for opiates. She was very agitated in spite of sedation and able to clearly communicate. Thus she was extubated in the ED. Alert and oriented following intubation. Denies SI. Mental status likely secondary to hypoglycemia. Will admit to Medicine for further observation.   Labs and imaging reviewed in my medical decision making if ordered. Patient discussed with my attending, Dr. Blinda Leatherwood.    Abagail Kitchens, MD 09/20/13 [phone]

## 2013-09-18 NOTE — ED Notes (Signed)
Pt difficult stick. phlebotomy to get another tech to come and try.

## 2013-09-18 NOTE — ED Notes (Signed)
Husband speaking with MD. He is concerned that she took medication to end her life as she had said just previously before ems was called "I just want to die".

## 2013-09-18 NOTE — Procedures (Signed)
Extubation Procedure Note  Patient Details:   Name: Amy Hood DOB: 1941/06/22 MRN: [id number]   Airway Documentation:  Airway 7.5 mm (Active)  Secured at (cm) 23 cm 09/18/2013  7:26 PM  Measured From Lips 09/18/2013  7:26 PM  Secured Location Center 09/18/2013  7:26 PM  Secured By [employer] 09/18/2013  7:26 PM  Site Condition Dry 09/18/2013  7:26 PM    Evaluation  O2 sats: currently acceptable Complications: No apparent complications Patient did tolerate procedure well. Bilateral Breath Sounds: Rhonchi   Patient speaking in complete sentences, placed on 2L nasal cannula. RT will continue to monitor.  Derryl Harbor 09/18/2013, 11:44 PM

## 2013-09-18 NOTE — ED Notes (Signed)
Pt extubated. MD. resp tech, EMT and this RN at bedside. Pt immediately verbal. Speaking to husband stating she is cold. Pt placed on 2L [place] vs wnl.

## 2013-09-18 NOTE — ED Notes (Signed)
cxr complete

## 2013-09-18 NOTE — ED Notes (Signed)
Pt transported to CT with this RN and respiratory.

## 2013-09-18 NOTE — ED Notes (Signed)
Pt becoming more aware of intubation tube. Pt fighting to pull tube out. EMT at bedside to hold arms down. MD aware.

## 2013-09-18 NOTE — ED Provider Notes (Signed)
CSN: [id number]     Arrival date & time 09/18/13  1915 History   First MD Initiated Contact with Patient 09/18/13 1919      HPI  Patient presented in severe distress.  Patient appear to tended and not following commands.  Airway secured in order to protect from aspiration.     Review of Systems  Allergies    Home Medications    BP 173/75  Pulse 98  Resp 20  SpO2 100% Physical Exam  ED Course  INTUBATION Date/Time: 09/18/2013 7:20 PM Performed by: Nelia Shi Authorized by: Nelia Shi Consent: Verbal consent not obtained. The procedure was performed in an emergent situation. Indications: respiratory distress and airway protection Intubation method: video-assisted Patient status: paralyzed (RSI) Preoxygenation: nonrebreather mask and BVM Sedatives: etomidate Paralytic: rocuronium Laryngoscope size: Mac 4 Tube size: 7.5 mm Tube type: cuffed Number of attempts: 1 Cricoid pressure: no Cords visualized: yes Post-procedure assessment: chest rise and CO2 detector Breath sounds: equal Cuff inflated: yes ETT to lip: 23 cm Tube secured with: ETT holder Patient tolerance: Patient tolerated the procedure well with no immediate complications. Comments: Postintubation x-ray not interpreted at time of note.       MDM    Nelia Shi, MD 09/18/13 1945

## 2013-09-18 NOTE — ED Notes (Signed)
phlebotomy at bedside to draw labs.

## 2013-09-18 NOTE — ED Notes (Signed)
ems arrived on scene to pt with c/o of left hip pain. pcp had decreased her hydrocodone. Family reported she was a&o at baseline. Upon arrival to ED pt was in resp. Distress. Pt family member now report that he thinks pt overdosed on pain medication. Pt speaks [place]. Son speaks english. Pt intubated on arrival to ED.

## 2013-09-18 NOTE — ED Notes (Signed)
cbg 132 

## 2013-09-18 NOTE — ED Notes (Signed)
Pt cbg 27. Dr. Blinda Leatherwood notified.

## 2013-09-19 ENCOUNTER — Inpatient Hospital Stay (HOSPITAL_COMMUNITY): Payer: Medicare Other

## 2013-09-19 ENCOUNTER — Encounter (HOSPITAL_COMMUNITY): Payer: Self-pay | Admitting: Internal Medicine

## 2013-09-19 DIAGNOSIS — I959 Hypotension, unspecified: Secondary | ICD-10-CM

## 2013-09-19 DIAGNOSIS — E876 Hypokalemia: Secondary | ICD-10-CM

## 2013-09-19 DIAGNOSIS — G9340 Encephalopathy, unspecified: Secondary | ICD-10-CM | POA: Diagnosis present

## 2013-09-19 DIAGNOSIS — T68XXXA Hypothermia, initial encounter: Secondary | ICD-10-CM

## 2013-09-19 DIAGNOSIS — E162 Hypoglycemia, unspecified: Secondary | ICD-10-CM

## 2013-09-19 LAB — COMPREHENSIVE METABOLIC PANEL
ALT: 6 U/L (ref 0–35)
AST: 19 U/L (ref 0–37)
Albumin: 2.6 g/dL — ABNORMAL LOW (ref 3.5–5.2)
Calcium: 6.9 mg/dL — ABNORMAL LOW (ref 8.4–10.5)
GFR calc Af Amer: >90 mL/min (ref >90)
Sodium: 140 mEq/L (ref 135–145)
Total Protein: 5.9 g/dL — ABNORMAL LOW (ref 6.0–8.3)

## 2013-09-19 LAB — ABO/RH: ABO/RH(D): A POS

## 2013-09-19 LAB — TYPE AND SCREEN
ABO/RH(D): A POS
Antibody Screen: NEGATIVE

## 2013-09-19 LAB — CORTISOL: Cortisol, Plasma: 12 ug/dL

## 2013-09-19 LAB — CBC WITH DIFFERENTIAL/PLATELET
Basophils Relative: 0 % (ref 0–1)
HCT: 25.9 % — ABNORMAL LOW (ref 36.0–46.0)
Hemoglobin: 8.9 g/dL — ABNORMAL LOW (ref 12.0–15.0)
MCH: 26.3 pg (ref 26.0–34.0)
MCHC: 34.4 g/dL (ref 30.0–36.0)
Monocytes Absolute: 0.4 10*3/uL (ref 0.1–1.0)
Monocytes Relative: 5 % (ref 3–12)
Neutro Abs: 7.4 10*3/uL (ref 1.7–7.7)
Platelets: 268 10*3/uL (ref 150–400)

## 2013-09-19 LAB — CBC
Hemoglobin: 9 g/dL — ABNORMAL LOW (ref 12.0–15.0)
MCH: 26 pg (ref 26.0–34.0)
MCHC: 34.4 g/dL (ref 30.0–36.0)
MCV: 76.9 fL — ABNORMAL LOW (ref 78.0–100.0)
Platelets: 272 10*3/uL (ref 150–400)
Platelets: 255 10*3/uL (ref 150–400)
RBC: 3.46 MIL/uL — ABNORMAL LOW (ref 3.87–5.11)
RDW: 14.7 % (ref 11.5–15.5)
WBC: 8.6 10*3/uL (ref 4.0–10.5)
WBC: 7.4 10*3/uL (ref 4.0–10.5)

## 2013-09-19 LAB — GLUCOSE, CAPILLARY
Glucose-Capillary: 72 mg/dL (ref 70–99)
Glucose-Capillary: 77 mg/dL (ref 70–99)
Glucose-Capillary: 109 mg/dL — ABNORMAL HIGH (ref 70–99)
Glucose-Capillary: 119 mg/dL — ABNORMAL HIGH (ref 70–99)
Glucose-Capillary: 133 mg/dL — ABNORMAL HIGH (ref 70–99)

## 2013-09-19 LAB — MAGNESIUM: Magnesium: 1.7 mg/dL (ref 1.5–2.5)

## 2013-09-19 LAB — LACTIC ACID, PLASMA: Lactic Acid, Venous: 1.1 mmol/L (ref 0.5–2.2)

## 2013-09-19 LAB — OCCULT BLOOD X 1 CARD TO LAB, STOOL: Fecal Occult Bld: POSITIVE — AB

## 2013-09-19 LAB — MRSA PCR SCREENING: MRSA by PCR: NEGATIVE

## 2013-09-19 MED ORDER — PIPERACILLIN-TAZOBACTAM 3.375 G IVPB 30 MIN
3.3750 g | Freq: Once | INTRAVENOUS | Status: AC
  Administered 2013-09-19: 3.375 g via INTRAVENOUS
  Filled 2013-09-19: qty 50

## 2013-09-19 MED ORDER — SODIUM CHLORIDE 0.9 % IV BOLUS (SEPSIS)
1000.0000 mL | Freq: Once | INTRAVENOUS | Status: AC
  Administered 2013-09-19: 1000 mL via INTRAVENOUS

## 2013-09-19 MED ORDER — VANCOMYCIN HCL 500 MG IV SOLR
500.0000 mg | Freq: Once | INTRAVENOUS | Status: AC
  Administered 2013-09-19: 500 mg via INTRAVENOUS
  Filled 2013-09-19: qty 500

## 2013-09-19 MED ORDER — ACETAMINOPHEN 325 MG PO TABS: 650.0000 mg | ORAL_TABLET | Freq: Four times a day (QID) | ORAL | Status: DC | PRN

## 2013-09-19 MED ORDER — PANTOPRAZOLE SODIUM 40 MG PO TBEC
40.0000 mg | DELAYED_RELEASE_TABLET | Freq: Every day | ORAL | Status: DC
  Administered 2013-09-19 – 2013-09-20 (×2): 40 mg via ORAL
  Filled 2013-09-19 (×2): qty 1

## 2013-09-19 MED ORDER — POTASSIUM CHLORIDE 10 MEQ/100ML IV SOLN
10.0000 meq | INTRAVENOUS | Status: AC
  Administered 2013-09-19 (×5): 10 meq via INTRAVENOUS
  Filled 2013-09-19 (×4): qty 100

## 2013-09-19 MED ORDER — PIPERACILLIN-TAZOBACTAM 3.375 G IVPB
3.3750 g | Freq: Three times a day (TID) | INTRAVENOUS | Status: DC
  Administered 2013-09-19: 3.375 g via INTRAVENOUS
  Filled 2013-09-19 (×2): qty 50

## 2013-09-19 MED ORDER — SODIUM CHLORIDE 0.9 % IV SOLN
INTRAVENOUS | Status: AC
  Administered 2013-09-19 (×2): via INTRAVENOUS

## 2013-09-19 MED ORDER — ONDANSETRON HCL 4 MG PO TABS: 4.0000 mg | ORAL_TABLET | Freq: Four times a day (QID) | ORAL | Status: DC | PRN

## 2013-09-19 MED ORDER — HYDROCODONE-ACETAMINOPHEN 5-325 MG PO TABS
1.0000 | ORAL_TABLET | Freq: Four times a day (QID) | ORAL | Status: DC
  Administered 2013-09-19 – 2013-09-21 (×10): 1 via ORAL
  Filled 2013-09-19 (×10): qty 1

## 2013-09-19 MED ORDER — PANTOPRAZOLE SODIUM 40 MG IV SOLR
40.0000 mg | Freq: Two times a day (BID) | INTRAVENOUS | Status: DC
  Filled 2013-09-19 (×2): qty 40

## 2013-09-19 MED ORDER — VANCOMYCIN HCL 500 MG IV SOLR
500.0000 mg | Freq: Two times a day (BID) | INTRAVENOUS | Status: DC
  Administered 2013-09-19 (×2): 500 mg via INTRAVENOUS
  Filled 2013-09-19 (×3): qty 500

## 2013-09-19 MED ORDER — ACETAMINOPHEN 650 MG RE SUPP: 650.0000 mg | Freq: Four times a day (QID) | RECTAL | Status: DC | PRN

## 2013-09-19 MED ORDER — VANCOMYCIN HCL IN DEXTROSE 1-5 GM/200ML-% IV SOLN: 1000.0000 mg | Freq: Once | INTRAVENOUS | Status: DC

## 2013-09-19 MED ORDER — SODIUM CHLORIDE 0.9 % IJ SOLN
3.0000 mL | Freq: Two times a day (BID) | INTRAMUSCULAR | Status: DC
  Administered 2013-09-19 – 2013-09-21 (×6): 3 mL via INTRAVENOUS

## 2013-09-19 MED ORDER — ONDANSETRON HCL 4 MG/2ML IJ SOLN: 4.0000 mg | Freq: Four times a day (QID) | INTRAMUSCULAR | Status: DC | PRN

## 2013-09-19 NOTE — ED Notes (Signed)
Pt continues to fight vent. given 2mg  versed bolus. MD aware.

## 2013-09-19 NOTE — ED Notes (Addendum)
Pt fighting vent- Pt given 2mg  versed bolus

## 2013-09-19 NOTE — H&P (Addendum)
[facility] History and Physical  Amy Hood ZOX:[id number] DOB: [birth year] DOA: 09/18/2013  Referring physician: ER physician. PCP: Georgann Housekeeper, MD   Chief Complaint: Altered mental status.  History of pain from ER physician and patient's husband.  HPI: Amy Hood is a 72 y.o. female with known history of chronic low back pain status post kyphoplasty recently for a lumbar fracture, history of hypertension hyperlipidemia and diabetes mellitus type 2 was brought to the ER after patient was found to be acutely confused. In the ER initially patient was intubated to protect the airway. Later on as patient became more alert and awake and following commands patient was extubated. Patient in addition also was found to be hypothermic, hyperglycemia and hypokalemia on admission. Patient has been placed on bear hugger (warming blankets). Patient has been stable she was doing fine yesterday morning and both of them had breakfast together and by afternoon patient became very emotional and stated that she wanted to kill herself. Later she became altered in mental status and was brought to the ER. Once patient woke up patient states that she had taken more pills than what is required particularly 2 tablets of Fosamax, aspirin and advil. Patient's husband states that recently her pain medications were decreased. She has been having increasing low back pain probably prompting to think of suicide. Patient otherwise denies any chest pain or shortness of breath nausea vomiting abdominal pain diarrhea. Patient is still hypothermic. Hypoglycemia is improved. Patient also hypotensive. Patient has already received 2 L of normal saline bolus. Chest x-ray shows possibility of pneumonitis.  Review of Systems: As presented in the history of presenting illness, rest negative.  Past Medical History  Diagnosis Date  . Hypertension   . Diabetes mellitus without complication   . Arthritis   . Hyperlipidemia   .  Cataracts, bilateral    Past Surgical History  Procedure Laterality Date  . Kyphoplasty     Social History:  reports that she has never smoked. She does not have any smokeless tobacco history on file. She reports that she does not drink alcohol or use illicit drugs. Where does patient live  home. Can patient participate in ADLs?  Yes.  No Known Allergies  Family History:  Family History  Problem Relation Age of Onset  . Other Neg Hx       Prior to Admission medications   Medication Sig Start Date End Date Taking? Authorizing Provider  alendronate (FOSAMAX) 70 MG tablet Take 70 mg by mouth every 7 (seven) days. Take with a full glass of water on an empty stomach.   Yes Historical Provider, MD  cholecalciferol (VITAMIN D) 1000 UNITS tablet Take 1,000 Units by mouth daily.   Yes Historical Provider, MD  glimepiride (AMARYL) 4 MG tablet Take 4 mg by mouth daily before breakfast.   Yes Historical Provider, MD  HYDROcodone-acetaminophen (NORCO/VICODIN) 5-325 MG per tablet Take 1 tablet by mouth 2 (two) times daily as needed for pain.   Yes Historical Provider, MD  lovastatin (MEVACOR) 40 MG tablet Take 40 mg by mouth daily.   Yes Historical Provider, MD  Ethelda Chick (OYSTER CALCIUM) 500 MG TABS tablet Take 500 mg of elemental calcium by mouth 2 (two) times daily.   Yes Historical Provider, MD  valsartan (DIOVAN) 80 MG tablet Take 80 mg by mouth daily.   Yes Historical Provider, MD  aspirin EC 81 MG tablet Take 81 mg by mouth daily.    Historical Provider, MD  ibuprofen (ADVIL) 200 MG tablet  Take 200 mg by mouth daily.     Historical Provider, MD  pioglitazone (ACTOS) 45 MG tablet Take 45 mg by mouth daily.    Historical Provider, MD    Physical Exam: Filed Vitals:   09/19/13 0000 09/19/13 0015 09/19/13 0030 09/19/13 0045  BP: 80/47 83/42 96/44  90/48  Pulse: 59 61 66 61  Temp:      TempSrc:      Resp: 17 22 19 19   SpO2: 100% 100% 100% 100%     General:  Well-developed and  nourished.  Eyes:  Anicteric no pallor.  ENT:  No discharge from the ears eyes nose or mouth.  Neck:  No mass felt.  Cardiovascular:  S1-S2 heard.  Respiratory:  No rhonchi or crepitations.  Abdomen:  Soft nontender bowel sounds present.  Skin:  No rash.  Musculoskeletal:  No edema.  Psychiatric:  Presently patient has no suicidal thoughts.  Neurologic:  Alert awake oriented to time place and person. Moves all extremities.  Labs on Admission:  Basic Metabolic Panel:  Recent Labs Lab 09/18/13 1941  NA 137  K 2.9*  CL 104  CO2 17*  GLUCOSE 111*  BUN 8  CREATININE 0.49*  CALCIUM 8.2*   Liver Function Tests:  Recent Labs Lab 09/18/13 1941  AST 20  ALT 6  ALKPHOS 76  BILITOT 0.2*  PROT 7.8  ALBUMIN 3.3*   No results found for this basename: LIPASE, AMYLASE,  in the last 168 hours No results found for this basename: AMMONIA,  in the last 168 hours CBC: No results found for this basename: WBC, NEUTROABS, HGB, HCT, MCV, PLT,  in the last 168 hours Cardiac Enzymes: No results found for this basename: CKTOTAL, CKMB, CKMBINDEX, TROPONINI,  in the last 168 hours  BNP (last 3 results) No results found for this basename: PROBNP,  in the last 8760 hours CBG:  Recent Labs Lab 09/18/13 1952 09/18/13 2009 09/18/13 2114 09/18/13 2241  GLUCAP 27* 132* 82 132*    Radiological Exams on Admission: Ct Head Wo Contrast (only If Suspected Head Trauma And/or Pt Is On Anticoagulant)  09/18/2013   CLINICAL DATA:  Respiratory distress.  EXAM: CT HEAD WITHOUT CONTRAST  TECHNIQUE: Contiguous axial images were obtained from the base of the skull through the vertex without intravenous contrast.  COMPARISON:  None.  FINDINGS: Bony calvarium appears intact. Mild bilateral ethmoid sinusitis is noted. Minimal diffuse cortical atrophy is noted. Ventricular size is within normal limits. No mass effect or midline shift is noted. There is no evidence of mass lesion, hemorrhage or acute  infarction.  IMPRESSION: Minimal diffuse cortical atrophy. Mild bilateral ethmoid sinusitis. No acute intracranial abnormality seen.   Electronically Signed   By: Roque Lias M.D.   On: 09/18/2013 21:18   Dg Chest Portable 1 View  09/18/2013   CLINICAL DATA:  Intubated. Overdose.  EXAM: PORTABLE CHEST - 1 VIEW  COMPARISON:  None.  FINDINGS: Borderline enlarged cardiac silhouette. Endotracheal tube in satisfactory position. Mildly prominent interstitial markings. Minimal left basilar opacity. Diffuse osteopenia. Old, healed right rib fracture.  IMPRESSION: 1. Endotracheal tube in satisfactory position. 2. Mild left basilar atelectasis and possible aspiration pneumonitis. 3. Mild did chronic interstitial lung disease.   Electronically Signed   By: Gordan Payment M.D.   On: 09/18/2013 20:06     Assessment/Plan Principal Problem:   Acute encephalopathy Active Problems:   Hypothermia   Hypotension   Hypoglycemia   Suicidal behavior   Hypokalemia   1.  Acute encephalopathy - essentially resolved at this time. Probably secondary to medications causing hypoglycemia and also hypothermia. Patient states she has taken Fosamax and aspirin along with Advil. It is not sure if she had taken more of her antihypertensives and diabetic medications. Presently we will closely observe. 2. Hypoglycemia - patient is diabetic and as explained in #1 it is not sure if patient had taken more of her diabetic medications along with her other medications. Closely follow CBC every 2 hourly. 3. Hypothermia - probably precipitated by #2. Check blood cultures, TSH, cortisol levels. For now I am placing patient on empiric antibiotics. 4. Hypotension - patient is to receive third liter normal saline bolus. Check CBC. Check blood cultures. Check cortisol levels and lactic acid levels. Patient does not look septic. Not sure if patient had taken more than required antihypertensives. Patient also had received fentanyl in the ER. Closely  observe. Continue hydration. If hemoglobin is low then may have to consider GI bleed. 5. Suicidal behavior - May need psychiatric consult. Suicide precautions. Sitter. 6. Hyperlipidemia - continue statins when patient can take orally. 7. Chronic low back pain - will need titration of pain relief medications.  Addendum - patient's hemoglobin was found to be low around 8 and a repeat hemoglobin was shown to be around 9. Patient had a bowel movement earlier which was brown in color. At this time patient is having another bowel movement and a stool for occult blood has been ordered. A repeat CBC has been ordered for 12 noon.  Code Status:  Full code.  Family Communication:  Patient's husband at the bedside.  Disposition Plan:  Admit to inpatient under Dr. Christene Slates.   Shabreka Coulon N. [facility] Pager [phone].  If 7PM-7AM, please contact night-coverage [email or web address] Password [facility] 09/19/2013, 12:59 AM

## 2013-09-19 NOTE — Progress Notes (Signed)
ANTIBIOTIC CONSULT NOTE - INITIAL  Pharmacy Consult for Vancocin and Zosyn Indication: rule out pneumonia  No Known Allergies  Patient Measurements: Height: 5\' 5"  (165.1 cm) Weight: 145 lb 15.1 oz (66.2 kg) IBW/kg (Calculated) : 57  Vital Signs: Temp: 97.3 F (36.3 C) (10/12 0125) Temp src: Core (Comment) (10/12 0125) BP: 148/97 mmHg (10/12 0125) Pulse Rate: 67 (10/12 0140) Intake/Output from previous day: 10/11 0701 - 10/12 0700 In: 3000 [I.V.:3000] Out: 825 [Urine:825] Intake/Output from this shift: Total I/O In: 3000 [I.V.:3000] Out: 825 [Urine:825]  Labs:  Recent Labs  09/18/13 1941  CREATININE 0.49*   Estimated Creatinine Clearance: 56.4 ml/min (by C-G formula based on Cr of 0.49).   Microbiology: No results found for this or any previous visit (from the past 720 hour(s)).  Medical History: Past Medical History  Diagnosis Date  . Hypertension   . Diabetes mellitus without complication   . Arthritis   . Hyperlipidemia   . Cataracts, bilateral     Medications:  Prescriptions prior to admission  Medication Sig Dispense Refill  . alendronate (FOSAMAX) 70 MG tablet Take 70 mg by mouth every 7 (seven) days. Take with a full glass of water on an empty stomach.      . cholecalciferol (VITAMIN D) 1000 UNITS tablet Take 1,000 Units by mouth daily.      Marland Kitchen glimepiride (AMARYL) 4 MG tablet Take 4 mg by mouth daily before breakfast.      . HYDROcodone-acetaminophen (NORCO/VICODIN) 5-325 MG per tablet Take 1 tablet by mouth 2 (two) times daily as needed for pain.      Marland Kitchen lovastatin (MEVACOR) 40 MG tablet Take 40 mg by mouth daily.      Ethelda Chick (OYSTER CALCIUM) 500 MG TABS tablet Take 500 mg of elemental calcium by mouth 2 (two) times daily.      . valsartan (DIOVAN) 80 MG tablet Take 80 mg by mouth daily.      Marland Kitchen aspirin EC 81 MG tablet Take 81 mg by mouth daily.      Marland Kitchen ibuprofen (ADVIL) 200 MG tablet Take 200 mg by mouth daily.       . pioglitazone (ACTOS) 45  MG tablet Take 45 mg by mouth daily.       Scheduled:  . dextrose      . piperacillin-tazobactam  3.375 g Intravenous Once  . piperacillin-tazobactam (ZOSYN)  IV  3.375 g Intravenous Q8H  . potassium chloride  10 mEq Intravenous Q1 Hr x 5  . sodium chloride  3 mL Intravenous Q12H  . vancomycin  500 mg Intravenous Once  . vancomycin  500 mg Intravenous Q12H  . [DISCONTINUED] vancomycin  1,000 mg Intravenous Once   Infusions:  . sodium chloride 150 mL/hr at 09/19/13 0148    Assessment: 71yo female c/o hip pain then in respiratory distress in AMS by the time she reached ED, family reports pt may have overdosed on pain med, CXR concerning for atelectasis vs aspiration pneumonitis, to begin IV ABX.  Goal of Therapy:  Vancomycin trough level 15-20 mcg/ml  Plan:  Will begin vancomycin 500mg  IV Q12H and Zosyn 3.375g IV Q8H and monitor CBC, Cx, levels prn.  Vernard Gambles, PharmD, BCPS  09/19/2013,1:49 AM

## 2013-09-19 NOTE — ED Notes (Signed)
Versed that was not administered to pt was wasted in sink with Zack, Rn as witness.

## 2013-09-19 NOTE — ED Notes (Signed)
Pt protecting airway. Airway remains patent. Pt speaking in full sentences. Family remains at bedside.

## 2013-09-19 NOTE — ED Notes (Addendum)
Pt. Has received 2000cc NS in ED. Third fluid bolus started due to hypotension- per Dr. Kirtland Bouchard.

## 2013-09-19 NOTE — Progress Notes (Signed)
Utilization Review Completed.Amy Hood T10/11/2013  

## 2013-09-19 NOTE — Progress Notes (Addendum)
Subjective: Amy Hood is alert and conversive this morning.  Appears comfortable but she does complain of chronic low back pain with radiation down both lower extremities left greater than right.  Yesterday was a tough day for her and that she became very emotionally distraught secondary to her chronic low back pain after having a relatively normal morning, she became agitated and to more of her usual pills at all including to Fosamax aspirin and Advil.  Recently her Vicodin have been reduced from 4 times a day to 2 times a day. Uncharacteristically, she contemplated suicide yesterday because of inadequate control of her low back pain.  She was brought to the [facility] emergency room and found to be hypothermic and hypotensive and hypoglycemic.  She has responded well to 2-3 liters of normal saline and is currently alert.  She does complain of low back pain that radiates to she does not want to herself as long as she can have adequate relief of pain in her low back.  She is currently calm and thankful for medical assistance. Do not know her baseline blood pressures but she is currently quite alert with blood pressure systolic of 88 and pulse is 70 and regular and O2 saturations are excellent and respiratory rate is 22 and nonlabored. Fortunately, her husband is present to interpret, and he thinks that her rational behavior yesterday may have been secondary to withdrawal from her chronic pain medication and her chronic pain.  She denies suicidal intentions today but she desperately wants her pain controlled.  Last MRI of the lumbar spine was a couple of months ago and we will repeat it today to see if there has been a change to suggest that surgery would be helpful. Yesterday at time she felt anesthesia from the waist down over the thighs but this has improved. Urinalysis on admission revealed positive nitrite and a few bacteria but no significant pyuria.  Stool is heme positive and hemoglobin is moderately  low  Objective: Weight change:   Intake/Output Summary (Last 24 hours) at 09/19/13 0916 Last data filed at 09/19/13 0831  Gross per 24 hour  Intake   5580 ml  Output   1451 ml  Net   4129 ml   Filed Vitals:   09/19/13 0630 09/19/13 0700 09/19/13 0800 09/19/13 0817  BP: 75/36 77/30 75/27  80/36  Pulse: 64 66 73 78  Temp:      TempSrc:      Resp: 19 19 16 17   Height:      Weight:      SpO2: 100% 100% 100% 100%    General Appearance: Alert, cooperative, no distress, appears stated age Lungs: Clear to auscultation bilaterally, respirations unlabored Heart: Regular rate and rhythm, S1 and S2 normal, no murmur, rub or gallop Abdomen: Soft, non-tender, bowel sounds active all four quadrants, no masses, no organomegaly Extremities: Extremities normal, atraumatic, no cyanosis or edema Neuro: Alert and nonfocal exam, speech is normal Moves lower extremities fully in all muscle groups - sensation is present in the lower extremities to light touch  Lab Results: Results for orders placed during the hospital encounter of 09/18/13 (from the past 48 hour(s))  COMPREHENSIVE METABOLIC PANEL     Status: Abnormal   Collection Time    09/18/13  7:41 PM      Result Value Range   Sodium 137  135 - 145 mEq/L   Potassium 2.9 (*) 3.5 - 5.1 mEq/L   Chloride 104  96 - 112 mEq/L  CO2 17 (*) 19 - 32 mEq/L   Glucose, Bld 111 (*) 70 - 99 mg/dL   BUN 8  6 - 23 mg/dL   Creatinine, Ser 1.61 (*) 0.50 - 1.10 mg/dL   Calcium 8.2 (*) 8.4 - 10.5 mg/dL   Total Protein 7.8  6.0 - 8.3 g/dL   Albumin 3.3 (*) 3.5 - 5.2 g/dL   AST 20  0 - 37 U/L   ALT 6  0 - 35 U/L   Alkaline Phosphatase 76  39 - 117 U/L   Total Bilirubin 0.2 (*) 0.3 - 1.2 mg/dL   GFR calc non Af Amer >90  >90 mL/min   GFR calc Af Amer >90  >90 mL/min   Comment: (NOTE)     The eGFR has been calculated using the CKD EPI equation.     This calculation has not been validated in all clinical situations.     eGFR's persistently <90 mL/min  signify possible Chronic Kidney     Disease.  ACETAMINOPHEN LEVEL     Status: None   Collection Time    09/18/13  7:41 PM      Result Value Range   Acetaminophen (Tylenol), Serum <15.0  10 - 30 ug/mL   Comment:            THERAPEUTIC CONCENTRATIONS VARY     SIGNIFICANTLY. A RANGE OF 10-30     ug/mL MAY BE AN EFFECTIVE     CONCENTRATION FOR MANY PATIENTS.     HOWEVER, SOME ARE BEST TREATED     AT CONCENTRATIONS OUTSIDE THIS     RANGE.     ACETAMINOPHEN CONCENTRATIONS     >150 ug/mL AT 4 HOURS AFTER     INGESTION AND >50 ug/mL AT 12     HOURS AFTER INGESTION ARE     OFTEN ASSOCIATED WITH TOXIC     REACTIONS.  SALICYLATE LEVEL     Status: None   Collection Time    09/18/13  7:41 PM      Result Value Range   Salicylate Lvl 9.0  2.8 - 20.0 mg/dL  GLUCOSE, CAPILLARY     Status: Abnormal   Collection Time    09/18/13  7:52 PM      Result Value Range   Glucose-Capillary 27 (*) 70 - 99 mg/dL  URINALYSIS, ROUTINE W REFLEX MICROSCOPIC     Status: Abnormal   Collection Time    09/18/13  8:09 PM      Result Value Range   Color, Urine YELLOW  YELLOW   APPearance CLEAR  CLEAR   Specific Gravity, Urine 1.012  1.005 - 1.030   pH 6.5  5.0 - 8.0   Glucose, UA 500 (*) NEGATIVE mg/dL   Hgb urine dipstick NEGATIVE  NEGATIVE   Bilirubin Urine NEGATIVE  NEGATIVE   Ketones, ur NEGATIVE  NEGATIVE mg/dL   Protein, ur NEGATIVE  NEGATIVE mg/dL   Urobilinogen, UA 0.2  0.0 - 1.0 mg/dL   Nitrite POSITIVE (*) NEGATIVE   Leukocytes, UA NEGATIVE  NEGATIVE  URINE RAPID DRUG SCREEN (HOSP PERFORMED)     Status: Abnormal   Collection Time    09/18/13  8:09 PM      Result Value Range   Opiates POSITIVE (*) NONE DETECTED   Cocaine NONE DETECTED  NONE DETECTED   Benzodiazepines NONE DETECTED  NONE DETECTED   Amphetamines NONE DETECTED  NONE DETECTED   Tetrahydrocannabinol NONE DETECTED  NONE DETECTED   Barbiturates NONE DETECTED  NONE DETECTED   Comment:            DRUG SCREEN FOR MEDICAL PURPOSES      ONLY.  IF CONFIRMATION IS NEEDED     FOR ANY PURPOSE, NOTIFY LAB     WITHIN 5 DAYS.                LOWEST DETECTABLE LIMITS     FOR URINE DRUG SCREEN     Drug Class       Cutoff (ng/mL)     Amphetamine      1000     Barbiturate      200     Benzodiazepine   200     Tricyclics       300     Opiates          300     Cocaine          300     THC              50  GLUCOSE, CAPILLARY     Status: Abnormal   Collection Time    09/18/13  8:09 PM      Result Value Range   Glucose-Capillary 132 (*) 70 - 99 mg/dL  URINE MICROSCOPIC-ADD ON     Status: Abnormal   Collection Time    09/18/13  8:09 PM      Result Value Range   Squamous Epithelial / LPF FEW (*) RARE   WBC, UA 0-2  <3 WBC/hpf   Bacteria, UA MANY (*) RARE  POCT I-STAT 3, BLOOD GAS (G3+)     Status: Abnormal   Collection Time    09/18/13  9:05 PM      Result Value Range   pH, Arterial 7.397  7.350 - 7.450   pCO2 arterial 36.8  35.0 - 45.0 mmHg   pO2, Arterial 360.0 (*) 80.0 - 100.0 mmHg   Bicarbonate 22.6  20.0 - 24.0 mEq/L   TCO2 24  0 - 100 mmol/L   O2 Saturation 100.0     Acid-base deficit 2.0  0.0 - 2.0 mmol/L   Patient temperature 98.6 F     Collection site RADIAL, ALLEN'S TEST ACCEPTABLE     Drawn by Operator     Sample type ARTERIAL    GLUCOSE, CAPILLARY     Status: None   Collection Time    09/18/13  9:14 PM      Result Value Range   Glucose-Capillary 82  70 - 99 mg/dL  POCT I-STAT TROPONIN I     Status: None   Collection Time    09/18/13  9:18 PM      Result Value Range   Troponin i, poc 0.07  0.00 - 0.08 ng/mL   Comment 3            Comment: Due to the release kinetics of cTnI,     a negative result within the first hours     of the onset of symptoms does not rule out     myocardial infarction with certainty.     If myocardial infarction is still suspected,     repeat the test at appropriate intervals.  GLUCOSE, CAPILLARY     Status: Abnormal   Collection Time    09/18/13 10:41 PM      Result  Value Range   Glucose-Capillary 132 (*) 70 - 99 mg/dL  LACTIC ACID, PLASMA     Status: None   Collection  Time    09/19/13 12:24 AM      Result Value Range   Lactic Acid, Venous 1.1  0.5 - 2.2 mmol/L  PROTIME-INR     Status: None   Collection Time    09/19/13 12:24 AM      Result Value Range   Prothrombin Time 12.6  11.6 - 15.2 seconds   INR 0.96  0.00 - 1.49  MAGNESIUM     Status: None   Collection Time    09/19/13 12:24 AM      Result Value Range   Magnesium 1.7  1.5 - 2.5 mg/dL  MRSA PCR SCREENING     Status: None   Collection Time    09/19/13  1:27 AM      Result Value Range   MRSA by PCR NEGATIVE  NEGATIVE   Comment:            The GeneXpert MRSA Assay (FDA     approved for NASAL specimens     only), is one component of a     comprehensive MRSA colonization     surveillance program. It is not     intended to diagnose MRSA     infection nor to guide or     monitor treatment for     MRSA infections.  GLUCOSE, CAPILLARY     Status: None   Collection Time    09/19/13  2:04 AM      Result Value Range   Glucose-Capillary 97  70 - 99 mg/dL  COMPREHENSIVE METABOLIC PANEL     Status: Abnormal   Collection Time    09/19/13  3:50 AM      Result Value Range   Sodium 140  135 - 145 mEq/L   Potassium 3.6  3.5 - 5.1 mEq/L   Chloride 109  96 - 112 mEq/L   CO2 21  19 - 32 mEq/L   Glucose, Bld 80  70 - 99 mg/dL   BUN 6  6 - 23 mg/dL   Creatinine, Ser 1.19 (*) 0.50 - 1.10 mg/dL   Calcium 6.9 (*) 8.4 - 10.5 mg/dL   Total Protein 5.9 (*) 6.0 - 8.3 g/dL   Albumin 2.6 (*) 3.5 - 5.2 g/dL   AST 19  0 - 37 U/L   ALT 6  0 - 35 U/L   Alkaline Phosphatase 58  39 - 117 U/L   Total Bilirubin 0.2 (*) 0.3 - 1.2 mg/dL   GFR calc non Af Amer >90  >90 mL/min   GFR calc Af Amer >90  >90 mL/min   Comment: (NOTE)     The eGFR has been calculated using the CKD EPI equation.     This calculation has not been validated in all clinical situations.     eGFR's persistently <90 mL/min signify  possible Chronic Kidney     Disease.  CBC WITH DIFFERENTIAL     Status: Abnormal   Collection Time    09/19/13  3:50 AM      Result Value Range   WBC 9.3  4.0 - 10.5 K/uL   RBC 3.39 (*) 3.87 - 5.11 MIL/uL   Hemoglobin 8.9 (*) 12.0 - 15.0 g/dL   Comment: DELTA CHECK NOTED     REPEATED TO VERIFY   HCT 25.9 (*) 36.0 - 46.0 %   MCV 76.4 (*) 78.0 - 100.0 fL   MCH 26.3  26.0 - 34.0 pg   MCHC 34.4  30.0 - 36.0 g/dL  RDW 14.6  11.5 - 15.5 %   Platelets 268  150 - 400 K/uL   Neutrophils Relative % 80 (*) 43 - 77 %   Neutro Abs 7.4  1.7 - 7.7 K/uL   Lymphocytes Relative 15  12 - 46 %   Lymphs Abs 1.4  0.7 - 4.0 K/uL   Monocytes Relative 5  3 - 12 %   Monocytes Absolute 0.4  0.1 - 1.0 K/uL   Eosinophils Relative 1  0 - 5 %   Eosinophils Absolute 0.1  0.0 - 0.7 K/uL   Basophils Relative 0  0 - 1 %   Basophils Absolute 0.0  0.0 - 0.1 K/uL  CK     Status: None   Collection Time    09/19/13  3:50 AM      Result Value Range   Total CK 166  7 - 177 U/L  GLUCOSE, CAPILLARY     Status: None   Collection Time    09/19/13  5:00 AM      Result Value Range   Glucose-Capillary 72  70 - 99 mg/dL  TYPE AND SCREEN     Status: None   Collection Time    09/19/13  5:40 AM      Result Value Range   ABO/RH(D) A POS     Antibody Screen NEG     Sample Expiration 09/22/2013    CBC     Status: Abnormal   Collection Time    09/19/13  5:40 AM      Result Value Range   WBC 8.6  4.0 - 10.5 K/uL   RBC 3.46 (*) 3.87 - 5.11 MIL/uL   Hemoglobin 9.0 (*) 12.0 - 15.0 g/dL   HCT 16.1 (*) 09.6 - 04.5 %   MCV 76.9 (*) 78.0 - 100.0 fL   MCH 26.0  26.0 - 34.0 pg   MCHC 33.8  30.0 - 36.0 g/dL   RDW 40.9  81.1 - 91.4 %   Platelets 272  150 - 400 K/uL  ABO/RH     Status: None   Collection Time    09/19/13  5:40 AM      Result Value Range   ABO/RH(D) A POS    GLUCOSE, CAPILLARY     Status: None   Collection Time    09/19/13  5:55 AM      Result Value Range   Glucose-Capillary 77  70 - 99 mg/dL  OCCULT  BLOOD X 1 CARD TO LAB, STOOL     Status: Abnormal   Collection Time    09/19/13  6:46 AM      Result Value Range   Fecal Occult Bld POSITIVE (*) NEGATIVE    Studies/Results: Ct Head Wo Contrast (only If Suspected Head Trauma And/or Pt Is On Anticoagulant)  09/18/2013   CLINICAL DATA:  Respiratory distress.  EXAM: CT HEAD WITHOUT CONTRAST  TECHNIQUE: Contiguous axial images were obtained from the base of the skull through the vertex without intravenous contrast.  COMPARISON:  None.  FINDINGS: Bony calvarium appears intact. Mild bilateral ethmoid sinusitis is noted. Minimal diffuse cortical atrophy is noted. Ventricular size is within normal limits. No mass effect or midline shift is noted. There is no evidence of mass lesion, hemorrhage or acute infarction.  IMPRESSION: Minimal diffuse cortical atrophy. Mild bilateral ethmoid sinusitis. No acute intracranial abnormality seen.   Electronically Signed   By: Roque Lias M.D.   On: 09/18/2013 21:18   Dg Chest Portable 1 View  09/18/2013   CLINICAL DATA:  Intubated. Overdose.  EXAM: PORTABLE CHEST - 1 VIEW  COMPARISON:  None.  FINDINGS: Borderline enlarged cardiac silhouette. Endotracheal tube in satisfactory position. Mildly prominent interstitial markings. Minimal left basilar opacity. Diffuse osteopenia. Old, healed right rib fracture.  IMPRESSION: 1. Endotracheal tube in satisfactory position. 2. Mild left basilar atelectasis and possible aspiration pneumonitis. 3. Mild did chronic interstitial lung disease.   Electronically Signed   By: Gordan Payment M.D.   On: 09/18/2013 20:06   Medications: Scheduled Meds: . HYDROcodone-acetaminophen  1 tablet Oral QID  . pantoprazole  40 mg Oral Daily  . sodium chloride  3 mL Intravenous Q12H  . vancomycin  500 mg Intravenous Q12H   Continuous Infusions: . sodium chloride 150 mL/hr at 09/19/13 0148   PRN Meds:.acetaminophen, acetaminophen, ondansetron (ZOFRAN) IV, ondansetron  Assessment/Plan: Principal  Problem:   Acute encephalopathy - resolved - may have been secondary to hypoglycemia and hypotension from dehydration and from pain medication withdrawal  Active Problems:   Hypothermia - resolved   Hypotension - resolving   Hypoglycemia - resolved   Suicidal behavior - resolved, likely secondary to pain medication withdrawal - sitter can be discontinued   Hypokalemia - resolved   Bacteria and positive nitrite - no pyuria.  Doubt significant UTI   Lumbar radiculopathy - repeat MRI of the lumbar spine   Diabetes mellitus - discontinue every 2 blood sugar checks and check a.c. and at bedtime and start diabetic diet and resume diabetic medications as   blood sugar starts to rise   Hyperlipidemia - on statins as outpatient   Osteoporosis - resume Fosamax as an outpatient   Heme positive stool - may have been secondary to Advil use.  Follow hemoglobin Q. 12   LOS: 1 day   Pearla Dubonnet, MD 09/19/2013, 9:16 AM

## 2013-09-19 NOTE — ED Notes (Signed)
 of fentanyl WIS with Sharyn Lull, RN as witness.

## 2013-09-20 ENCOUNTER — Encounter (HOSPITAL_COMMUNITY): Payer: Self-pay | Admitting: Radiology

## 2013-09-20 DIAGNOSIS — M5416 Radiculopathy, lumbar region: Secondary | ICD-10-CM

## 2013-09-20 DIAGNOSIS — S32050A Wedge compression fracture of fifth lumbar vertebra, initial encounter for closed fracture: Secondary | ICD-10-CM

## 2013-09-20 DIAGNOSIS — D649 Anemia, unspecified: Secondary | ICD-10-CM

## 2013-09-20 LAB — CBC WITH DIFFERENTIAL/PLATELET
Basophils Absolute: 0 10*3/uL (ref 0.0–0.1)
Eosinophils Relative: 3 % (ref 0–5)
Lymphocytes Relative: 29 % (ref 12–46)
Lymphs Abs: 2.4 10*3/uL (ref 0.7–4.0)
Monocytes Absolute: 0.4 10*3/uL (ref 0.1–1.0)
Neutro Abs: 5.3 10*3/uL (ref 1.7–7.7)
Neutrophils Relative %: 63 % (ref 43–77)
Platelets: 279 10*3/uL (ref 150–400)
RBC: 3.25 MIL/uL — ABNORMAL LOW (ref 3.87–5.11)
RDW: 14.9 % (ref 11.5–15.5)
WBC: 8.4 10*3/uL (ref 4.0–10.5)

## 2013-09-20 LAB — URINALYSIS, ROUTINE W REFLEX MICROSCOPIC
Glucose, UA: NEGATIVE mg/dL
Hgb urine dipstick: NEGATIVE
Ketones, ur: 15 mg/dL — AB
Leukocytes, UA: NEGATIVE
Protein, ur: NEGATIVE mg/dL
Urobilinogen, UA: 0.2 mg/dL (ref 0.0–1.0)

## 2013-09-20 LAB — GLUCOSE, CAPILLARY
Glucose-Capillary: 85 mg/dL (ref 70–99)
Glucose-Capillary: 199 mg/dL — ABNORMAL HIGH (ref 70–99)

## 2013-09-20 MED ORDER — PANTOPRAZOLE SODIUM 40 MG PO TBEC: 40.0000 mg | DELAYED_RELEASE_TABLET | Freq: Every day | ORAL | Status: DC

## 2013-09-20 MED ORDER — CEFAZOLIN SODIUM-DEXTROSE 2-3 GM-% IV SOLR
2.0000 g | INTRAVENOUS | Status: AC
  Administered 2013-09-20: 2 g via INTRAVENOUS
  Filled 2013-09-20 (×2): qty 50

## 2013-09-20 MED ORDER — POLYSACCHARIDE IRON COMPLEX 150 MG PO CAPS
150.0000 mg | ORAL_CAPSULE | Freq: Every day | ORAL | Status: DC
  Administered 2013-09-20: 150 mg via ORAL
  Filled 2013-09-20 (×3): qty 1

## 2013-09-20 MED ORDER — DOCUSATE SODIUM 100 MG PO CAPS
100.0000 mg | ORAL_CAPSULE | Freq: Two times a day (BID) | ORAL | Status: DC
  Administered 2013-09-20 (×2): 100 mg via ORAL
  Filled 2013-09-20 (×4): qty 1

## 2013-09-20 NOTE — H&P (Signed)
Amy Hood is an 72 y.o. female.   Chief Complaint: admitted for new back pain MRI reveals acute Lumbar 5 fracture Has hx of L1 kyphoplasty 07/09/2013 L2 KP 08/13/2013 Now scheduled for L5 Kyphoplasty HPI: HTN; DM; HLD; previous L1 and L2 KP  Past Medical History  Diagnosis Date  . Hypertension   . Diabetes mellitus without complication   . Arthritis   . Hyperlipidemia   . Cataracts, bilateral     Past Surgical History  Procedure Laterality Date  . Kyphoplasty      Family History  Problem Relation Age of Onset  . Other Neg Hx    Social History:  reports that she has never smoked. She does not have any smokeless tobacco history on file. She reports that she does not drink alcohol or use illicit drugs.  Allergies: No Known Allergies  Medications Prior to Admission  Medication Sig Dispense Refill  . alendronate (FOSAMAX) 70 MG tablet Take 70 mg by mouth every 7 (seven) days. Take with a full glass of water on an empty stomach.      . cholecalciferol (VITAMIN D) 1000 UNITS tablet Take 1,000 Units by mouth daily.      Marland Kitchen glimepiride (AMARYL) 4 MG tablet Take 4 mg by mouth daily before breakfast.      . HYDROcodone-acetaminophen (NORCO/VICODIN) 5-325 MG per tablet Take 1 tablet by mouth 2 (two) times daily as needed for pain.      Marland Kitchen lovastatin (MEVACOR) 40 MG tablet Take 40 mg by mouth daily.      Ethelda Chick (OYSTER CALCIUM) 500 MG TABS tablet Take 500 mg of elemental calcium by mouth 2 (two) times daily.      . valsartan (DIOVAN) 80 MG tablet Take 80 mg by mouth daily.      Marland Kitchen aspirin EC 81 MG tablet Take 81 mg by mouth daily.      Marland Kitchen ibuprofen (ADVIL) 200 MG tablet Take 200 mg by mouth daily.       . pioglitazone (ACTOS) 45 MG tablet Take 45 mg by mouth daily.        Results for orders placed during the hospital encounter of 09/18/13 (from the past 48 hour(s))  COMPREHENSIVE METABOLIC PANEL     Status: Abnormal   Collection Time    09/18/13  7:41 PM      Result Value Range    Sodium 137  135 - 145 mEq/L   Potassium 2.9 (*) 3.5 - 5.1 mEq/L   Chloride 104  96 - 112 mEq/L   CO2 17 (*) 19 - 32 mEq/L   Glucose, Bld 111 (*) 70 - 99 mg/dL   BUN 8  6 - 23 mg/dL   Creatinine, Ser 1.61 (*) 0.50 - 1.10 mg/dL   Calcium 8.2 (*) 8.4 - 10.5 mg/dL   Total Protein 7.8  6.0 - 8.3 g/dL   Albumin 3.3 (*) 3.5 - 5.2 g/dL   AST 20  0 - 37 U/L   ALT 6  0 - 35 U/L   Alkaline Phosphatase 76  39 - 117 U/L   Total Bilirubin 0.2 (*) 0.3 - 1.2 mg/dL   GFR calc non Af Amer >90  >90 mL/min   GFR calc Af Amer >90  >90 mL/min   Comment: (NOTE)     The eGFR has been calculated using the CKD EPI equation.     This calculation has not been validated in all clinical situations.     eGFR's persistently <90  mL/min signify possible Chronic Kidney     Disease.  ACETAMINOPHEN LEVEL     Status: None   Collection Time    09/18/13  7:41 PM      Result Value Range   Acetaminophen (Tylenol), Serum <15.0  10 - 30 ug/mL   Comment:            THERAPEUTIC CONCENTRATIONS VARY     SIGNIFICANTLY. A RANGE OF 10-30     ug/mL MAY BE AN EFFECTIVE     CONCENTRATION FOR MANY PATIENTS.     HOWEVER, SOME ARE BEST TREATED     AT CONCENTRATIONS OUTSIDE THIS     RANGE.     ACETAMINOPHEN CONCENTRATIONS     >150 ug/mL AT 4 HOURS AFTER     INGESTION AND >50 ug/mL AT 12     HOURS AFTER INGESTION ARE     OFTEN ASSOCIATED WITH TOXIC     REACTIONS.  SALICYLATE LEVEL     Status: None   Collection Time    09/18/13  7:41 PM      Result Value Range   Salicylate Lvl 9.0  2.8 - 20.0 mg/dL  TSH     Status: None   Collection Time    09/18/13  7:41 PM      Result Value Range   TSH 0.477  0.350 - 4.500 uIU/mL   Comment: Performed at [facility]  GLUCOSE, CAPILLARY     Status: Abnormal   Collection Time    09/18/13  7:52 PM      Result Value Range   Glucose-Capillary 27 (*) 70 - 99 mg/dL  URINALYSIS, ROUTINE W REFLEX MICROSCOPIC     Status: Abnormal   Collection Time    09/18/13  8:09 PM      Result  Value Range   Color, Urine YELLOW  YELLOW   APPearance CLEAR  CLEAR   Specific Gravity, Urine 1.012  1.005 - 1.030   pH 6.5  5.0 - 8.0   Glucose, UA 500 (*) NEGATIVE mg/dL   Hgb urine dipstick NEGATIVE  NEGATIVE   Bilirubin Urine NEGATIVE  NEGATIVE   Ketones, ur NEGATIVE  NEGATIVE mg/dL   Protein, ur NEGATIVE  NEGATIVE mg/dL   Urobilinogen, UA 0.2  0.0 - 1.0 mg/dL   Nitrite POSITIVE (*) NEGATIVE   Leukocytes, UA NEGATIVE  NEGATIVE  URINE RAPID DRUG SCREEN (HOSP PERFORMED)     Status: Abnormal   Collection Time    09/18/13  8:09 PM      Result Value Range   Opiates POSITIVE (*) NONE DETECTED   Cocaine NONE DETECTED  NONE DETECTED   Benzodiazepines NONE DETECTED  NONE DETECTED   Amphetamines NONE DETECTED  NONE DETECTED   Tetrahydrocannabinol NONE DETECTED  NONE DETECTED   Barbiturates NONE DETECTED  NONE DETECTED   Comment:            DRUG SCREEN FOR MEDICAL PURPOSES     ONLY.  IF CONFIRMATION IS NEEDED     FOR ANY PURPOSE, NOTIFY LAB     WITHIN 5 DAYS.                LOWEST DETECTABLE LIMITS     FOR URINE DRUG SCREEN     Drug Class       Cutoff (ng/mL)     Amphetamine      1000     Barbiturate      200     Benzodiazepine   200  Tricyclics       300     Opiates          300     Cocaine          300     THC              50  GLUCOSE, CAPILLARY     Status: Abnormal   Collection Time    09/18/13  8:09 PM      Result Value Range   Glucose-Capillary 132 (*) 70 - 99 mg/dL  URINE MICROSCOPIC-ADD ON     Status: Abnormal   Collection Time    09/18/13  8:09 PM      Result Value Range   Squamous Epithelial / LPF FEW (*) RARE   WBC, UA 0-2  <3 WBC/hpf   Bacteria, UA MANY (*) RARE  POCT I-STAT 3, BLOOD GAS (G3+)     Status: Abnormal   Collection Time    09/18/13  9:05 PM      Result Value Range   pH, Arterial 7.397  7.350 - 7.450   pCO2 arterial 36.8  35.0 - 45.0 mmHg   pO2, Arterial 360.0 (*) 80.0 - 100.0 mmHg   Bicarbonate 22.6  20.0 - 24.0 mEq/L   TCO2 24  0 - 100  mmol/L   O2 Saturation 100.0     Acid-base deficit 2.0  0.0 - 2.0 mmol/L   Patient temperature 98.6 F     Collection site RADIAL, ALLEN'S TEST ACCEPTABLE     Drawn by Operator     Sample type ARTERIAL    GLUCOSE, CAPILLARY     Status: None   Collection Time    09/18/13  9:14 PM      Result Value Range   Glucose-Capillary 82  70 - 99 mg/dL  POCT I-STAT TROPONIN I     Status: None   Collection Time    09/18/13  9:18 PM      Result Value Range   Troponin i, poc 0.07  0.00 - 0.08 ng/mL   Comment 3            Comment: Due to the release kinetics of cTnI,     a negative result within the first hours     of the onset of symptoms does not rule out     myocardial infarction with certainty.     If myocardial infarction is still suspected,     repeat the test at appropriate intervals.  GLUCOSE, CAPILLARY     Status: Abnormal   Collection Time    09/18/13 10:41 PM      Result Value Range   Glucose-Capillary 132 (*) 70 - 99 mg/dL  LACTIC ACID, PLASMA     Status: None   Collection Time    09/19/13 12:24 AM      Result Value Range   Lactic Acid, Venous 1.1  0.5 - 2.2 mmol/L  PROTIME-INR     Status: None   Collection Time    09/19/13 12:24 AM      Result Value Range   Prothrombin Time 12.6  11.6 - 15.2 seconds   INR 0.96  0.00 - 1.49  MAGNESIUM     Status: None   Collection Time    09/19/13 12:24 AM      Result Value Range   Magnesium 1.7  1.5 - 2.5 mg/dL  CORTISOL     Status: None   Collection Time    09/19/13 12:31  AM      Result Value Range   Cortisol, Plasma 12.0     Comment: (NOTE)     AM:  4.3 - 22.4 ug/dL     PM:  3.1 - 16.1 ug/dL     Performed at [facility]  CULTURE, BLOOD (ROUTINE X 2)     Status: None   Collection Time    09/19/13 12:35 AM      Result Value Range   Specimen Description BLOOD RIGHT ARM     Special Requests BOTTLES DRAWN AEROBIC ONLY 5CC     Culture  Setup Time       Value: 09/19/2013 17:34     Performed at [facility]    Culture       Value:        BLOOD CULTURE RECEIVED NO GROWTH TO DATE CULTURE WILL BE HELD FOR 5 DAYS BEFORE ISSUING A FINAL NEGATIVE REPORT     Performed at [facility]   Report Status PENDING    CULTURE, BLOOD (ROUTINE X 2)     Status: None   Collection Time    09/19/13 12:42 AM      Result Value Range   Specimen Description BLOOD LEFT ARM     Special Requests BOTTLES DRAWN AEROBIC ONLY 8CC     Culture  Setup Time       Value: 09/19/2013 17:34     Performed at [facility]   Culture       Value:        BLOOD CULTURE RECEIVED NO GROWTH TO DATE CULTURE WILL BE HELD FOR 5 DAYS BEFORE ISSUING A FINAL NEGATIVE REPORT     Performed at [facility]   Report Status PENDING    MRSA PCR SCREENING     Status: None   Collection Time    09/19/13  1:27 AM      Result Value Range   MRSA by PCR NEGATIVE  NEGATIVE   Comment:            The GeneXpert MRSA Assay (FDA     approved for NASAL specimens     only), is one component of a     comprehensive MRSA colonization     surveillance program. It is not     intended to diagnose MRSA     infection nor to guide or     monitor treatment for     MRSA infections.  GLUCOSE, CAPILLARY     Status: None   Collection Time    09/19/13  2:04 AM      Result Value Range   Glucose-Capillary 97  70 - 99 mg/dL  COMPREHENSIVE METABOLIC PANEL     Status: Abnormal   Collection Time    09/19/13  3:50 AM      Result Value Range   Sodium 140  135 - 145 mEq/L   Potassium 3.6  3.5 - 5.1 mEq/L   Chloride 109  96 - 112 mEq/L   CO2 21  19 - 32 mEq/L   Glucose, Bld 80  70 - 99 mg/dL   BUN 6  6 - 23 mg/dL   Creatinine, Ser 0.96 (*) 0.50 - 1.10 mg/dL   Calcium 6.9 (*) 8.4 - 10.5 mg/dL   Total Protein 5.9 (*) 6.0 - 8.3 g/dL   Albumin 2.6 (*) 3.5 - 5.2 g/dL   AST 19  0 - 37 U/L   ALT 6  0 - 35  U/L   Alkaline Phosphatase 58  39 - 117 U/L   Total Bilirubin 0.2 (*) 0.3 - 1.2 mg/dL   GFR calc non Af Amer >90  >90 mL/min   GFR calc Af Amer  >90  >90 mL/min   Comment: (NOTE)     The eGFR has been calculated using the CKD EPI equation.     This calculation has not been validated in all clinical situations.     eGFR's persistently <90 mL/min signify possible Chronic Kidney     Disease.  CBC WITH DIFFERENTIAL     Status: Abnormal   Collection Time    09/19/13  3:50 AM      Result Value Range   WBC 9.3  4.0 - 10.5 K/uL   RBC 3.39 (*) 3.87 - 5.11 MIL/uL   Hemoglobin 8.9 (*) 12.0 - 15.0 g/dL   Comment: DELTA CHECK NOTED     REPEATED TO VERIFY   HCT 25.9 (*) 36.0 - 46.0 %   MCV 76.4 (*) 78.0 - 100.0 fL   MCH 26.3  26.0 - 34.0 pg   MCHC 34.4  30.0 - 36.0 g/dL   RDW 46.9  62.9 - 52.8 %   Platelets 268  150 - 400 K/uL   Neutrophils Relative % 80 (*) 43 - 77 %   Neutro Abs 7.4  1.7 - 7.7 K/uL   Lymphocytes Relative 15  12 - 46 %   Lymphs Abs 1.4  0.7 - 4.0 K/uL   Monocytes Relative 5  3 - 12 %   Monocytes Absolute 0.4  0.1 - 1.0 K/uL   Eosinophils Relative 1  0 - 5 %   Eosinophils Absolute 0.1  0.0 - 0.7 K/uL   Basophils Relative 0  0 - 1 %   Basophils Absolute 0.0  0.0 - 0.1 K/uL  CK     Status: None   Collection Time    09/19/13  3:50 AM      Result Value Range   Total CK 166  7 - 177 U/L  GLUCOSE, CAPILLARY     Status: None   Collection Time    09/19/13  5:00 AM      Result Value Range   Glucose-Capillary 72  70 - 99 mg/dL  TYPE AND SCREEN     Status: None   Collection Time    09/19/13  5:40 AM      Result Value Range   ABO/RH(D) A POS     Antibody Screen NEG     Sample Expiration 09/22/2013    CBC     Status: Abnormal   Collection Time    09/19/13  5:40 AM      Result Value Range   WBC 8.6  4.0 - 10.5 K/uL   RBC 3.46 (*) 3.87 - 5.11 MIL/uL   Hemoglobin 9.0 (*) 12.0 - 15.0 g/dL   HCT 41.3 (*) 24.4 - 01.0 %   MCV 76.9 (*) 78.0 - 100.0 fL   MCH 26.0  26.0 - 34.0 pg   MCHC 33.8  30.0 - 36.0 g/dL   RDW 27.2  53.6 - 64.4 %   Platelets 272  150 - 400 K/uL  ABO/RH     Status: None   Collection Time     09/19/13  5:40 AM      Result Value Range   ABO/RH(D) A POS    GLUCOSE, CAPILLARY     Status: None   Collection Time    09/19/13  5:55 AM      Result Value Range   Glucose-Capillary 77  70 - 99 mg/dL  OCCULT BLOOD X 1 CARD TO LAB, STOOL     Status: Abnormal   Collection Time    09/19/13  6:46 AM      Result Value Range   Fecal Occult Bld POSITIVE (*) NEGATIVE  GLUCOSE, CAPILLARY     Status: None   Collection Time    09/19/13  7:57 AM      Result Value Range   Glucose-Capillary 85  70 - 99 mg/dL  GLUCOSE, CAPILLARY     Status: Abnormal   Collection Time    09/19/13 12:17 PM      Result Value Range   Glucose-Capillary 109 (*) 70 - 99 mg/dL  CBC     Status: Abnormal   Collection Time    09/19/13  2:30 PM      Result Value Range   WBC 7.4  4.0 - 10.5 K/uL   RBC 3.26 (*) 3.87 - 5.11 MIL/uL   Hemoglobin 8.6 (*) 12.0 - 15.0 g/dL   HCT 45.4 (*) 09.8 - 11.9 %   MCV 76.7 (*) 78.0 - 100.0 fL   MCH 26.4  26.0 - 34.0 pg   MCHC 34.4  30.0 - 36.0 g/dL   RDW 14.7  82.9 - 56.2 %   Platelets 255  150 - 400 K/uL  GLUCOSE, CAPILLARY     Status: Abnormal   Collection Time    09/19/13  5:03 PM      Result Value Range   Glucose-Capillary 119 (*) 70 - 99 mg/dL  GLUCOSE, CAPILLARY     Status: Abnormal   Collection Time    09/19/13  9:57 PM      Result Value Range   Glucose-Capillary 133 (*) 70 - 99 mg/dL  CBC WITH DIFFERENTIAL     Status: Abnormal   Collection Time    09/20/13  3:50 AM      Result Value Range   WBC 8.4  4.0 - 10.5 K/uL   RBC 3.25 (*) 3.87 - 5.11 MIL/uL   Hemoglobin 8.5 (*) 12.0 - 15.0 g/dL   HCT 13.0 (*) 86.5 - 78.4 %   MCV 77.5 (*) 78.0 - 100.0 fL   MCH 26.2  26.0 - 34.0 pg   MCHC 33.7  30.0 - 36.0 g/dL   RDW 69.6  29.5 - 28.4 %   Platelets 279  150 - 400 K/uL   Neutrophils Relative % 63  43 - 77 %   Neutro Abs 5.3  1.7 - 7.7 K/uL   Lymphocytes Relative 29  12 - 46 %   Lymphs Abs 2.4  0.7 - 4.0 K/uL   Monocytes Relative 5  3 - 12 %   Monocytes Absolute 0.4   0.1 - 1.0 K/uL   Eosinophils Relative 3  0 - 5 %   Eosinophils Absolute 0.3  0.0 - 0.7 K/uL   Basophils Relative 0  0 - 1 %   Basophils Absolute 0.0  0.0 - 0.1 K/uL  GLUCOSE, CAPILLARY     Status: Abnormal   Collection Time    09/20/13  7:09 AM      Result Value Range   Glucose-Capillary 109 (*) 70 - 99 mg/dL  URINALYSIS, ROUTINE W REFLEX MICROSCOPIC     Status: Abnormal   Collection Time    09/20/13  8:22 AM      Result Value Range   Color, Urine  YELLOW  YELLOW   APPearance CLEAR  CLEAR   Specific Gravity, Urine 1.013  1.005 - 1.030   pH 5.5  5.0 - 8.0   Glucose, UA NEGATIVE  NEGATIVE mg/dL   Hgb urine dipstick NEGATIVE  NEGATIVE   Bilirubin Urine NEGATIVE  NEGATIVE   Ketones, ur 15 (*) NEGATIVE mg/dL   Protein, ur NEGATIVE  NEGATIVE mg/dL   Urobilinogen, UA 0.2  0.0 - 1.0 mg/dL   Nitrite NEGATIVE  NEGATIVE   Leukocytes, UA NEGATIVE  NEGATIVE   Comment: MICROSCOPIC NOT DONE ON URINES WITH NEGATIVE PROTEIN, BLOOD, LEUKOCYTES, NITRITE, OR GLUCOSE <1000 mg/dL.   Ct Head Wo Contrast (only If Suspected Head Trauma And/or Pt Is On Anticoagulant)  09/18/2013   CLINICAL DATA:  Respiratory distress.  EXAM: CT HEAD WITHOUT CONTRAST  TECHNIQUE: Contiguous axial images were obtained from the base of the skull through the vertex without intravenous contrast.  COMPARISON:  None.  FINDINGS: Bony calvarium appears intact. Mild bilateral ethmoid sinusitis is noted. Minimal diffuse cortical atrophy is noted. Ventricular size is within normal limits. No mass effect or midline shift is noted. There is no evidence of mass lesion, hemorrhage or acute infarction.  IMPRESSION: Minimal diffuse cortical atrophy. Mild bilateral ethmoid sinusitis. No acute intracranial abnormality seen.   Electronically Signed   By: Roque Lias M.D.   On: 09/18/2013 21:18   Mr Lumbar Spine Wo Contrast  09/20/2013   CLINICAL DATA:  Low back pain.  EXAM: MRI LUMBAR SPINE WITHOUT CONTRAST  TECHNIQUE: Multiplanar, multisequence  MR imaging was performed. No intravenous contrast was administered.  COMPARISON:  07/23/2013  FINDINGS: New marrow edema within the mildly compressed L5 vertebral body, with height loss approximately 25%. No significant bony retropulsion. No evidence of posterior element fracturing or subluxation. No significant neighboring disc edema or endplate erosion. There is heterogeneous signal intensity material in the ventral epidural space at the level of the L5, extending inferiorly to the level of the S1, likely combination of venous epidural engorgement and epidural hemorrhage.  No further compression of recently treated L2 compression fracture. Methylmethacrylate appears central to the vertebral body.  Unchanged appearance of remote L1 compression fracture, status post bone augmentation. Retropulsion is again noted, most notable central and left paracentral, with crowding of the left lateral recess and left foramen. Height loss at this level appear similar to prior, nearly 50%.  Normal conus signal and morphology. Ventral CSF is less well seen in the mid lumbar region, particularly at the level of L3 and L4, which may be related to flow disturbance or epidural venous engorgement.  No new degenerative changes. There are multiple small disc bulges and herniations, none causing advanced canal or foraminal stenosis. The most notable is again seen on the left at L5-S1, where a small disk herniation contacts the inferior L5 nerve root within the foramen.  T2 hyperintense masses within the right kidney are stable from prior, measuring up to 2 cm in the interpolar region. These most likely represent cysts.  IMPRESSION: 1. Acute/unhealed L5 compression fracture, new from 07/23/2013. No significant height loss or bony retropulsion. 2. Treated L1 and L2 compression fractures. Bony retropulsion at L1 is stable from prior, causing left lateral recess and foraminal stenosis.   Electronically Signed   By: Tiburcio Pea M.D.   On:  09/20/2013 01:39   Dg Chest Portable 1 View  09/18/2013   CLINICAL DATA:  Intubated. Overdose.  EXAM: PORTABLE CHEST - 1 VIEW  COMPARISON:  None.  FINDINGS:  Borderline enlarged cardiac silhouette. Endotracheal tube in satisfactory position. Mildly prominent interstitial markings. Minimal left basilar opacity. Diffuse osteopenia. Old, healed right rib fracture.  IMPRESSION: 1. Endotracheal tube in satisfactory position. 2. Mild left basilar atelectasis and possible aspiration pneumonitis. 3. Mild did chronic interstitial lung disease.   Electronically Signed   By: Gordan Payment M.D.   On: 09/18/2013 20:06    Review of Systems  Constitutional: Negative for fever and weight loss.  Respiratory: Negative for shortness of breath.   Cardiovascular: Negative for chest pain.  Gastrointestinal: Negative for nausea and vomiting.  Musculoskeletal: Positive for back pain.  Neurological: Positive for weakness. Negative for headaches.    Blood pressure 130/53, pulse 71, temperature 98.5 F (36.9 C), temperature source Oral, resp. rate 28, height 5\' 5"  (1.651 m), weight 154 lb 15.7 oz (70.3 kg), SpO2 95.00%. Physical Exam  Constitutional: She is oriented to person, place, and time. She appears well-nourished.  Cardiovascular: Normal rate, regular rhythm and normal heart sounds.   No murmur heard. Respiratory: Effort normal and breath sounds normal. She has no wheezes.  GI: Soft. Bowel sounds are normal.  Musculoskeletal: Normal range of motion.  Painful low back  Neurological: She is alert and oriented to person, place, and time.  Skin: Skin is warm and dry.  Psychiatric: She has a normal mood and affect. Her behavior is normal. Judgment and thought content normal.  Pt speaks very little [place]- husband is with her He speaks [place] well- signed consent and interpreted for pt     Assessment/Plan New L5 fracture- painful Previous L1 and L2 KP Scheduled now for L5 KP Films have been reviewed by Dr  Corliss Skains Pt has eaten breakfast Insurance certification pending Pt and husband aware we will plan fpr procedure this afternoon May need to postpone til 10/14-- we will let them know asap. RN aware.  Arlon Bleier A 09/20/2013, 9:34 AM

## 2013-09-20 NOTE — Evaluation (Addendum)
Physical Therapy Evaluation Patient Details Name: Amy Hood MRN: [id number] DOB: 1941-04-30 Today's Date: 09/20/2013 Time: 4742-5956 PT Time Calculation (min): 33 min  PT Assessment / Plan / Recommendation History of Present Illness  Amy Hood is a 72 y.o. female with known history of chronic low back pain status post kyphoplasty recently for a lumbar fracture, history of hypertension hyperlipidemia and diabetes mellitus type 2 was brought to the ER after patient was found to be acutely confused. In the ER initially patient was intubated to protect the airway. Later on as patient became more alert and awake and following commands patient was extubated. Patient in addition also was found to be hypothermic, hyperglycemia and hypokalemia on admission.  Clinical Impression  Pt pleasant but with severely limited activity tolerance due to pain. Pt able to pivot to [facility] and perform pericare without assistance but moves quickly with transfers despite cueing for precautions. Pt demonstrates below deficits and will benefit from acute therapy as well as HHPT to maximize function, safety, precautions and decrease pain. Spouse and dgtr present and translating instructions throughout with handout provided. Dr.Husain present on arrival and agreeable to PT eval prior to kyphoplasty.    PT Assessment  Patient needs continued PT services    Follow Up Recommendations  Home health PT;Supervision for mobility/OOB    Does the patient have the potential to tolerate intense rehabilitation      Barriers to Discharge        Equipment Recommendations  Rolling walker with 5" wheels    Recommendations for Other Services OT consult   Frequency Min 3X/week    Precautions / Restrictions Precautions Precautions: Fall;Back Precaution Booklet Issued: Yes (comment)   Pertinent Vitals/Pain Pain 5/10 HR 81 BP 127/46      Mobility  Bed Mobility Bed Mobility: Rolling Right;Right Sidelying to Sit;Sit to  Sidelying Right Rolling Right: 4: Min guard Right Sidelying to Sit: 4: Min guard;HOB flat Sit to Sidelying Right: 4: Min guard;HOB flat Details for Bed Mobility Assistance: verbal, visual and tactile cueing throughout to maintain precautions as pt tends to move quickly and does not wait for translation of instructions from family. Sit to supine x 2 Transfers Transfers: Sit to Stand;Stand to Sit;Stand Pivot Transfers Sit to Stand: 4: Min guard;From bed;From chair/3-in-1 Stand to Sit: 4: Min guard;To bed;To chair/3-in-1 Stand Pivot Transfers: 4: Min assist Details for Transfer Assistance: cueing for hand placement, posture ,safety and sequence Ambulation/Gait Ambulation/Gait Assistance: 4: Min assist Ambulation Distance (Feet): 8 Feet Assistive device: Rolling walker Ambulation/Gait Assistance Details: cueing for posture and position in Rw. pt maintaining flexion despite multimodal cueing Gait Pattern: Shuffle;Trunk flexed Gait velocity: decreased Stairs: No    Exercises     PT Diagnosis: Difficulty walking;Acute pain  PT Problem List: Decreased activity tolerance;Decreased mobility;Decreased knowledge of precautions;Pain;Decreased knowledge of use of DME PT Treatment Interventions: Gait training;DME instruction;Stair training;Functional mobility training;Therapeutic activities;Therapeutic exercise;Patient/family education     PT Goals(Current goals can be found in the care plan section) Acute Rehab PT Goals Patient Stated Goal: walk without pain PT Goal Formulation: With patient/family Time For Goal Achievement: 10/04/13 Potential to Achieve Goals: Good  Visit Information  Last PT Received On: 09/20/13 Assistance Needed: +1 History of Present Illness: Amy Hood is a 72 y.o. female with known history of chronic low back pain status post kyphoplasty recently for a lumbar fracture, history of hypertension hyperlipidemia and diabetes mellitus type 2 was brought to the ER after  patient was found to be acutely confused. In  the ER initially patient was intubated to protect the airway. Later on as patient became more alert and awake and following commands patient was extubated. Patient in addition also was found to be hypothermic, hyperglycemia and hypokalemia on admission.       Prior Functioning  Home Living Family/patient expects to be discharged to:: Private residence Living Arrangements: Spouse/significant other Available Help at Discharge: Family;Available 24 hours/day Type of Home: House Home Access: Stairs to enter [employer] of Steps: 1 Home Layout: One level Home Equipment: Walker - standard;Bedside commode Prior Function Level of Independence: Independent Comments: limited activity tolerance since L2kyphoplasty and only ambulates in the home Communication Communication: No difficulties    Cognition  Cognition Arousal/Alertness: Awake/alert Behavior During Therapy: WFL for tasks assessed/performed Overall Cognitive Status: Difficult to assess Memory: Decreased recall of precautions Difficult to assess due to: Non-English speaking    Extremity/Trunk Assessment Upper Extremity Assessment Upper Extremity Assessment: Overall WFL for tasks assessed Lower Extremity Assessment Lower Extremity Assessment: Overall WFL for tasks assessed Cervical / Trunk Assessment Cervical / Trunk Assessment: Kyphotic   Balance    End of Session PT - End of Session Activity Tolerance: Patient limited by pain Patient left: in bed;with call bell/phone within reach;with family/visitor present Nurse Communication: Mobility status  GP     Amy Hood 09/20/2013, 12:57 PM  Delaney Meigs, PT [phone]

## 2013-09-20 NOTE — Progress Notes (Signed)
Insurance authorization pending for procedure. Can eat today Make NPO p MN  On schedule for tomorrow as long as Ins Amy Hood comes through

## 2013-09-20 NOTE — Progress Notes (Signed)
Nutrition Brief Note  Patient identified on the Malnutrition Screening Tool (MST) Report  Wt Readings from Last 15 Encounters:  09/20/13 154 lb 15.7 oz (70.3 kg)  08/13/13 142 lb (64.411 kg)    Body mass index is 25.79 kg/(m^2). Patient meets criteria for overweight based on current BMI.   Current diet order is Carb Mod Medium, patient is consuming approximately 100% of meals at this time. Labs and medications reviewed.   RD discussed diet and intake with husband at bedside.  Husband states that he prepares meals and at home and ensures his wife eats adequately.  He states she typically eats well and aside from a few pounds of wt loss (unable to state number) with recent surgery, pt's wt has been stable. No nutrition interventions warranted at this time. If nutrition issues arise, please consult RD.   Loyce Dys, MS RD LDN Clinical Inpatient Dietitian Pager: [phone] Weekend/After hours pager: [phone]

## 2013-09-20 NOTE — Progress Notes (Signed)
Subjective: C/o of pain in legs and buttock New L5 comp fx on MRI MS at baseline  Objective: Vital signs in last 24 hours: Temp:  [98.5 F (36.9 C)-99.3 F (37.4 C)] 98.5 F (36.9 C) (10/13 0700) Pulse Rate:  [64-78] 71 (10/12 1605) Resp:  [15-23] 23 (10/13 0000) BP: (75-114)/(27-52) 107/52 mmHg (10/13 0000) SpO2:  [94 %-100 %] 95 % (10/13 0700) Weight:  [70.3 kg (154 lb 15.7 oz)] 70.3 kg (154 lb 15.7 oz) (10/13 0414) Weight change: 4.1 kg (9 lb 0.6 oz)    Intake/Output from previous day: 10/12 0701 - 10/13 0700 In: 1990 [P.O.:240; I.V.:1500; IV Piggyback:250] Out: 726 [Urine:725; Stool:1] Intake/Output this shift:    General appearance: alert and cooperative Resp: clear to auscultation bilaterally Cardio: regular rate and rhythm GI: soft, non-tender; bowel sounds normal; no masses,  no organomegaly Extremities: extremities normal, atraumatic, no cyanosis or edema Neurologic: Grossly normal  Lab Results:  Recent Labs  09/19/13 1430 09/20/13 0350  WBC 7.4 8.4  HGB 8.6* 8.5*  HCT 25.0* 25.2*  PLT 255 279   BMET  Recent Labs  09/18/13 1941 09/19/13 0350  NA 137 140  K 2.9* 3.6  CL 104 109  CO2 17* 21  GLUCOSE 111* 80  BUN 8 6  CREATININE 0.49* 0.47*  CALCIUM 8.2* 6.9*    Studies/Results: Ct Head Wo Contrast (only If Suspected Head Trauma And/or Pt Is On Anticoagulant)  09/18/2013   CLINICAL DATA:  Respiratory distress.  EXAM: CT HEAD WITHOUT CONTRAST  TECHNIQUE: Contiguous axial images were obtained from the base of the skull through the vertex without intravenous contrast.  COMPARISON:  None.  FINDINGS: Bony calvarium appears intact. Mild bilateral ethmoid sinusitis is noted. Minimal diffuse cortical atrophy is noted. Ventricular size is within normal limits. No mass effect or midline shift is noted. There is no evidence of mass lesion, hemorrhage or acute infarction.  IMPRESSION: Minimal diffuse cortical atrophy. Mild bilateral ethmoid sinusitis. No acute  intracranial abnormality seen.   Electronically Signed   By: Roque Lias M.D.   On: 09/18/2013 21:18   Mr Lumbar Spine Wo Contrast  09/20/2013   CLINICAL DATA:  Low back pain.  EXAM: MRI LUMBAR SPINE WITHOUT CONTRAST  TECHNIQUE: Multiplanar, multisequence MR imaging was performed. No intravenous contrast was administered.  COMPARISON:  07/23/2013  FINDINGS: New marrow edema within the mildly compressed L5 vertebral body, with height loss approximately 25%. No significant bony retropulsion. No evidence of posterior element fracturing or subluxation. No significant neighboring disc edema or endplate erosion. There is heterogeneous signal intensity material in the ventral epidural space at the level of the L5, extending inferiorly to the level of the S1, likely combination of venous epidural engorgement and epidural hemorrhage.  No further compression of recently treated L2 compression fracture. Methylmethacrylate appears central to the vertebral body.  Unchanged appearance of remote L1 compression fracture, status post bone augmentation. Retropulsion is again noted, most notable central and left paracentral, with crowding of the left lateral recess and left foramen. Height loss at this level appear similar to prior, nearly 50%.  Normal conus signal and morphology. Ventral CSF is less well seen in the mid lumbar region, particularly at the level of L3 and L4, which may be related to flow disturbance or epidural venous engorgement.  No new degenerative changes. There are multiple small disc bulges and herniations, none causing advanced canal or foraminal stenosis. The most notable is again seen on the left at L5-S1, where a small  disk herniation contacts the inferior L5 nerve root within the foramen.  T2 hyperintense masses within the right kidney are stable from prior, measuring up to 2 cm in the interpolar region. These most likely represent cysts.  IMPRESSION: 1. Acute/unhealed L5 compression fracture, new from  07/23/2013. No significant height loss or bony retropulsion. 2. Treated L1 and L2 compression fractures. Bony retropulsion at L1 is stable from prior, causing left lateral recess and foraminal stenosis.   Electronically Signed   By: Tiburcio Pea M.D.   On: 09/20/2013 01:39   Dg Chest Portable 1 View  09/18/2013   CLINICAL DATA:  Intubated. Overdose.  EXAM: PORTABLE CHEST - 1 VIEW  COMPARISON:  None.  FINDINGS: Borderline enlarged cardiac silhouette. Endotracheal tube in satisfactory position. Mildly prominent interstitial markings. Minimal left basilar opacity. Diffuse osteopenia. Old, healed right rib fracture.  IMPRESSION: 1. Endotracheal tube in satisfactory position. 2. Mild left basilar atelectasis and possible aspiration pneumonitis. 3. Mild did chronic interstitial lung disease.   Electronically Signed   By: Gordan Payment M.D.   On: 09/18/2013 20:06    Medications: I have reviewed the patient's current medications.  Assessment/Plan: Acute metabolic encephalopathy- with hypothermia.hypotension hypoglycemia-  Resolved Acute on chronic back pain/ lumbar radiculopathy/ recent L2 comp fx with kyphoplasty- now with new L5 compression Fx-  eval for Kypoplasty with IR- pain control  Repeat Urine/ culture  Anemia- with use of NSAID-  Stop NSAID- start on Iron/ PPI DM- BS ok HTN- BP low- off Diovan PT therapy    LOS: 2 days   Cem Kosman 09/20/2013, 7:48 AM

## 2013-09-21 LAB — GLUCOSE, CAPILLARY: Glucose-Capillary: 130 mg/dL — ABNORMAL HIGH (ref 70–99)

## 2013-09-21 LAB — URINE CULTURE: Colony Count: NO GROWTH

## 2013-09-21 MED ORDER — POLYSACCHARIDE IRON COMPLEX 150 MG PO CAPS: 150.0000 mg | ORAL_CAPSULE | Freq: Every day | ORAL | Status: AC

## 2013-09-21 MED ORDER — HYDROCODONE-ACETAMINOPHEN 5-325 MG PO TABS: 1.0000 | ORAL_TABLET | Freq: Four times a day (QID) | ORAL | Status: DC | PRN

## 2013-09-21 NOTE — Discharge Summary (Signed)
Physician Discharge Summary  Patient ID: Amy Hood MRN: [id number] DOB/AGE: 1941/03/10 72 y.o.  Admit date: 09/18/2013 Discharge date: 09/21/2013  Admission Diagnoses:  Discharge Diagnoses:  Principal Problem:   Acute encephalopathy-Metabolic; resolve Active Problems:   Hypothermia   Hypotension   Hypoglycemia   Suicidal behavior-Secondary to pain Medicatio withdrawal   Hypokalemia    Acute Compression fracture of L5 lumbar vertebra   Acute lumbar radiculopathy   Anemia History of Hypertension History of diabetes   Discharged Condition: good  Hospital Course: 72 years old veitnemese female with history of  Diabetes, hypertensin, recent compression fracture with kyphoplasty; increasing back pain; also found to ae acute mental status nges after taking medications for her pain- she tried t take her regular medications- along with other pain medications; because Of the pain, Told her husband that she does not want to live Problem #1: acute metabolic Encephalopathy-hypotension;Hyperthermia;: because of the Medication overdose- CT scan negative of the head; shee was admitted to the p down IV fluids given;patient's Condition within  With her Mental status to the baseline. Blood pressureImproved blood sugars improved Lower back pain: previous history of compress fracture: MRI showed new compression fracture in L5 area Radiology is Consultant kyphoplasty- Because of the insurance Approval she has wait  least next fewys to get the [employer] approves it. Radiology will contct the patient for kyphoplasty Pain control with Hydrocodone Physical Therapy consulted- continue with walker Continue with diabnd blood pressure medications Anemia  Hemoglobin: 8.5; guaiac positive stool patient was taking Advil and aspirin; discontinue t Hemoglobin remained stable; no active bleeding- Start on iron Supplement  Followup Outpatient Hemoglobin  Consults: Interventtional  radiology Significant Diagnostic Studies: labs: Hemoglobin 8.5 blood chemistries blood counts normal and radiology: CXR: normal and MRI: acute L5  lumbar compsion  ffracture  Treatments: IV hydration and analgesia: Vicodin  Discharge Exam: Blood pressure 119/56, pulse 71, temperature 98.3 F (36.8 C), temperature source Oral, resp. rate 22, height 5\' 5"  (1.651 m), weight 69.7 kg (153 lb 10.6 oz), SpO2 92.00%. General appearance: alert Resp: clear to auscultation bilaterally Cardio: regular rate and rhythm Neurologic: Grossly normal  Disposition: ED Dismiss - Diverted Elsewhere  Discharge Orders   Future Orders Complete By Expires   Diet - low sodium heart healthy  As directed    Increase activity slowly  As directed        Medication List    STOP taking these medications       ADVIL 200 MG tablet  Generic drug:  ibuprofen     aspirin EC 81 MG tablet      TAKE these medications       alendronate 70 MG tablet  Commonly known as:  FOSAMAX  Take 70 mg by mouth every 7 (seven) days. Take with a full glass of water on an empty stomach.     cholecalciferol 1000 UNITS tablet  Commonly known as:  VITAMIN D  Take 1,000 Units by mouth daily.     glimepiride 4 MG tablet  Commonly known as:  AMARYL  Take 4 mg by mouth daily before breakfast.     HYDROcodone-acetaminophen 5-325 MG per tablet  Commonly known as:  NORCO/VICODIN  Take 1 tablet by mouth every 6 (six) hours as needed for pain.     iron polysaccharides 150 MG capsule  Commonly known as:  NIFEREX  Take 1 capsule (150 mg total) by mouth daily.     lovastatin 40 MG tablet  Commonly known as:  MEVACOR  Take 40 mg by mouth daily.     oyster calcium 500 MG Tabs tablet  Take 500 mg of elemental calcium by mouth 2 (two) times daily.     pioglitazone 45 MG tablet  Commonly known as:  ACTOS  Take 45 mg by mouth daily.     valsartan 80 MG tablet  Commonly known as:  DIOVAN  Take 80 mg by mouth daily.            Follow-up Information   Follow up with Georgann Housekeeper, MD In 2 weeks.   Specialty:  Internal Medicine   Contact information:   [address], [address] [place] [address] [phone]      Discharge planning time total 45 minutes Signed: Georgann Housekeeper 09/21/2013, 12:36 PM

## 2013-09-21 NOTE — Progress Notes (Signed)
Subjective: Pain some better BS- ok Awaiting kypoplasty  Objective: Vital signs in last 24 hours: Temp:  [98.3 F (36.8 C)-99.2 F (37.3 C)] 98.5 F (36.9 C) (10/14 0400) Resp:  [20-28] 20 (10/14 0400) BP: (120-134)/(44-66) 120/66 mmHg (10/14 0400) SpO2:  [92 %-95 %] 92 % (10/14 0400) Weight:  [69.7 kg (153 lb 10.6 oz)] 69.7 kg (153 lb 10.6 oz) (10/14 0500) Weight change: -0.6 kg (-1 lb 5.2 oz)    Intake/Output from previous day: 10/13 0701 - 10/14 0700 In: 173 [P.O.:120; I.V.:3; IV Piggyback:50] Out: 310 [Urine:310] Intake/Output this shift:    General appearance: alert Resp: clear to auscultation bilaterally Cardio: regular rate and rhythm GI: soft, non-tender; bowel sounds normal; no masses,  no organomegaly  Lab Results:  Recent Labs  09/19/13 1430 09/20/13 0350  WBC 7.4 8.4  HGB 8.6* 8.5*  HCT 25.0* 25.2*  PLT 255 279   BMET  Recent Labs  09/18/13 1941 09/19/13 0350  NA 137 140  K 2.9* 3.6  CL 104 109  CO2 17* 21  GLUCOSE 111* 80  BUN 8 6  CREATININE 0.49* 0.47*  CALCIUM 8.2* 6.9*    Studies/Results: Mr Lumbar Spine Wo Contrast  09/20/2013   CLINICAL DATA:  Low back pain.  EXAM: MRI LUMBAR SPINE WITHOUT CONTRAST  TECHNIQUE: Multiplanar, multisequence MR imaging was performed. No intravenous contrast was administered.  COMPARISON:  07/23/2013  FINDINGS: New marrow edema within the mildly compressed L5 vertebral body, with height loss approximately 25%. No significant bony retropulsion. No evidence of posterior element fracturing or subluxation. No significant neighboring disc edema or endplate erosion. There is heterogeneous signal intensity material in the ventral epidural space at the level of the L5, extending inferiorly to the level of the S1, likely combination of venous epidural engorgement and epidural hemorrhage.  No further compression of recently treated L2 compression fracture. Methylmethacrylate appears central to the vertebral body.   Unchanged appearance of remote L1 compression fracture, status post bone augmentation. Retropulsion is again noted, most notable central and left paracentral, with crowding of the left lateral recess and left foramen. Height loss at this level appear similar to prior, nearly 50%.  Normal conus signal and morphology. Ventral CSF is less well seen in the mid lumbar region, particularly at the level of L3 and L4, which may be related to flow disturbance or epidural venous engorgement.  No new degenerative changes. There are multiple small disc bulges and herniations, none causing advanced canal or foraminal stenosis. The most notable is again seen on the left at L5-S1, where a small disk herniation contacts the inferior L5 nerve root within the foramen.  T2 hyperintense masses within the right kidney are stable from prior, measuring up to 2 cm in the interpolar region. These most likely represent cysts.  IMPRESSION: 1. Acute/unhealed L5 compression fracture, new from 07/23/2013. No significant height loss or bony retropulsion. 2. Treated L1 and L2 compression fractures. Bony retropulsion at L1 is stable from prior, causing left lateral recess and foraminal stenosis.   Electronically Signed   By: Tiburcio Pea M.D.   On: 09/20/2013 01:39    Medications: I have reviewed the patient's current medications.  Assessment/Plan: Acute metabolic encephalopathy- with hypothermia.hypotension hypoglycemia- Resolved  Acute on chronic back pain/ lumbar radiculopathy/ recent L2 comp fx with kyphoplasty- now with new L5 compression Fx- Kypoplasty with IR- today Repeat Urine/ culture- negative use of NSAID- Stop NSAID- start on Iron/ PPI  DM- BS ok  HTN- BP better PT  therapy  Ok for d/c after kypoplasty F/u in 1 week at office   LOS: 3 days   Amy Hood 09/21/2013, 7:48 AM

## 2013-09-21 NOTE — Progress Notes (Signed)
PT Cancellation Note  Patient Details Name: Amy Hood MRN: [id number] DOB: 01-01-1941   Cancelled Treatment:    Reason Eval/Treat Not Completed: Other (comment) (await kyphoplasty prior to further mobility due to pt pain level)   Delorse Lek 09/21/2013, 7:23 AM Delaney Meigs, PT [phone]

## 2013-09-21 NOTE — Progress Notes (Signed)
09/21/2013 11:59 AM  Discharge instructions given to husband with prescriptions.  IV's removed.  Amy Hood, Linnell Fulling

## 2013-09-21 NOTE — Progress Notes (Signed)
Inpatient Diabetes Program Recommendations  AACE/ADA: New Consensus Statement on Inpatient Glycemic Control (2013)  Target Ranges:  Prepandial:   less than 140 mg/dL      Peak postprandial:   less than 180 mg/dL (1-2 hours)      Critically ill patients:  140 - 180 mg/dL   Reason for Assessment: One CBG of 199 mg/dl  Inpatient Diabetes Program Recommendations HgbA1C: No Hgb A1C known.  Request Hgb A1C.  Note: Thank you.  Nilaya Bouie S. Elsie Lincoln, RN, CNS, CDE Inpatient Diabetes Program, team pager [phone]

## 2013-09-22 NOTE — ED Provider Notes (Signed)
I saw and evaluated the patient, reviewed the resident's note and I agree with the findings and plan.  Assumed care patient from [facility] after intubation. EMS brought the patient to the ER with acutely decompensating mental status. There was concern for overdose, patient has been making statements about wanting to diet prior to this episode. Evaluation did reveal profound hypoglycemia with accompanying hypothermia. She was very agitated after intubation and appeared to have progressively improving mental status after repletion of her glucose, extubation in the ER was performed and the patient continued to do well. She did require hospitalization for treatment of profound hypoglycemia, and UTI, possible overdose.  Gilda Crease, MD 09/22/13 [phone]

## 2013-09-24 ENCOUNTER — Other Ambulatory Visit: Payer: Self-pay | Admitting: Radiology

## 2013-09-24 ENCOUNTER — Other Ambulatory Visit (HOSPITAL_COMMUNITY): Payer: Self-pay | Admitting: Interventional Radiology

## 2013-09-24 DIAGNOSIS — IMO0002 Reserved for concepts with insufficient information to code with codable children: Secondary | ICD-10-CM

## 2013-09-24 DIAGNOSIS — M549 Dorsalgia, unspecified: Secondary | ICD-10-CM

## 2013-09-25 LAB — CULTURE, BLOOD (ROUTINE X 2): Culture: NO GROWTH

## 2013-09-27 ENCOUNTER — Other Ambulatory Visit (HOSPITAL_COMMUNITY): Payer: Self-pay | Admitting: Interventional Radiology

## 2013-09-27 ENCOUNTER — Ambulatory Visit (HOSPITAL_COMMUNITY)
Admission: RE | Admit: 2013-09-27 | Discharge: 2013-09-27 | Disposition: A | Discharge status: Home / Self Care | Payer: Medicare Other | Source: Ambulatory Visit | Attending: Interventional Radiology | Admitting: Interventional Radiology

## 2013-09-27 DIAGNOSIS — M549 Dorsalgia, unspecified: Secondary | ICD-10-CM

## 2013-09-27 DIAGNOSIS — IMO0002 Reserved for concepts with insufficient information to code with codable children: Secondary | ICD-10-CM

## 2013-09-27 DIAGNOSIS — E785 Hyperlipidemia, unspecified: Secondary | ICD-10-CM | POA: Insufficient documentation

## 2013-09-27 DIAGNOSIS — M8448XA Pathological fracture, other site, initial encounter for fracture: Secondary | ICD-10-CM | POA: Insufficient documentation

## 2013-09-27 DIAGNOSIS — E119 Type 2 diabetes mellitus without complications: Secondary | ICD-10-CM | POA: Insufficient documentation

## 2013-09-27 DIAGNOSIS — I10 Essential (primary) hypertension: Secondary | ICD-10-CM | POA: Insufficient documentation

## 2013-09-27 DIAGNOSIS — M810 Age-related osteoporosis without current pathological fracture: Secondary | ICD-10-CM | POA: Insufficient documentation

## 2013-09-27 LAB — CBC
HCT: 30 % — ABNORMAL LOW (ref 36.0–46.0)
MCH: 25.9 pg — ABNORMAL LOW (ref 26.0–34.0)
MCHC: 33.3 g/dL (ref 30.0–36.0)
MCV: 77.7 fL — ABNORMAL LOW (ref 78.0–100.0)
RDW: 15.4 % (ref 11.5–15.5)

## 2013-09-27 LAB — BASIC METABOLIC PANEL
BUN: 4 mg/dL — ABNORMAL LOW (ref 6–23)
CO2: 24 mEq/L (ref 19–32)
Chloride: 102 mEq/L (ref 96–112)
Creatinine, Ser: 0.37 mg/dL — ABNORMAL LOW (ref 0.50–1.10)
Glucose, Bld: 175 mg/dL — ABNORMAL HIGH (ref 70–99)
Potassium: 3.8 mEq/L (ref 3.5–5.1)
Sodium: 139 mEq/L (ref 135–145)

## 2013-09-27 MED ORDER — FENTANYL CITRATE 0.05 MG/ML IJ SOLN
INTRAMUSCULAR | Status: AC
  Filled 2013-09-27: qty 4

## 2013-09-27 MED ORDER — HYDROMORPHONE HCL PF 1 MG/ML IJ SOLN
INTRAMUSCULAR | Status: AC | PRN
  Administered 2013-09-27: 1 mg

## 2013-09-27 MED ORDER — SODIUM CHLORIDE 0.9 % IV SOLN: INTRAVENOUS | Status: AC

## 2013-09-27 MED ORDER — CEFAZOLIN SODIUM-DEXTROSE 2-3 GM-% IV SOLR
INTRAVENOUS | Status: AC
  Filled 2013-09-27: qty 50

## 2013-09-27 MED ORDER — CEFAZOLIN SODIUM-DEXTROSE 2-3 GM-% IV SOLR
2.0000 g | Freq: Once | INTRAVENOUS | Status: AC
  Administered 2013-09-27: 2 g via INTRAVENOUS

## 2013-09-27 MED ORDER — MIDAZOLAM HCL 2 MG/2ML IJ SOLN
INTRAMUSCULAR | Status: AC
  Filled 2013-09-27: qty 6

## 2013-09-27 MED ORDER — MIDAZOLAM HCL 2 MG/2ML IJ SOLN
INTRAMUSCULAR | Status: AC | PRN
  Administered 2013-09-27: 1 mg via INTRAVENOUS

## 2013-09-27 MED ORDER — GELATIN ABSORBABLE 12-7 MM EX MISC
CUTANEOUS | Status: AC
  Filled 2013-09-27: qty 1

## 2013-09-27 MED ORDER — SODIUM CHLORIDE 0.9 % IV SOLN: Freq: Once | INTRAVENOUS | Status: DC

## 2013-09-27 MED ORDER — IOHEXOL 300 MG/ML  SOLN
50.0000 mL | Freq: Once | INTRAMUSCULAR | Status: AC | PRN
  Administered 2013-09-27: 3 mL via INTRAVENOUS

## 2013-09-27 MED ORDER — TOBRAMYCIN SULFATE 1.2 G IJ SOLR
INTRAMUSCULAR | Status: AC
  Filled 2013-09-27: qty 1.2

## 2013-09-27 MED ORDER — FENTANYL CITRATE 0.05 MG/ML IJ SOLN
INTRAMUSCULAR | Status: AC | PRN
  Administered 2013-09-27: 25 ug via INTRAVENOUS

## 2013-09-27 MED ORDER — HYDROMORPHONE HCL PF 1 MG/ML IJ SOLN
INTRAMUSCULAR | Status: AC
  Filled 2013-09-27: qty 3

## 2013-09-27 NOTE — Progress Notes (Signed)
Discharge instruction given  Per MD order with the interperter.   Pt out to car via wheelchair.

## 2013-09-27 NOTE — Procedures (Signed)
S/P L5 veretebroplasty.

## 2013-09-27 NOTE — H&P (Signed)
Amy Hood is an 72 y.o. female.   Chief Complaint: low back pain HPI: Patient with history of back pain and prior kyphoplasties at L1 and L2 levels presents today for L5 kyphoplasty secondary to symptomatic acute L5 compression fracture noted on recent imaging.  Past Medical History  Diagnosis Date  . Hypertension   . Diabetes mellitus without complication   . Arthritis   . Hyperlipidemia   . Cataracts, bilateral     Past Surgical History  Procedure Laterality Date  . Kyphoplasty      Family History  Problem Relation Age of Onset  . Other Neg Hx    Social History:  reports that she has never smoked. She does not have any smokeless tobacco history on file. She reports that she does not drink alcohol or use illicit drugs.  Allergies: No Known Allergies  Current outpatient prescriptions:alendronate (FOSAMAX) 70 MG tablet, Take 70 mg by mouth every 7 (seven) days. Take with a full glass of water on an empty stomach., Disp: , Rfl: ;  cholecalciferol (VITAMIN D) 1000 UNITS tablet, Take 1,000 Units by mouth daily., Disp: , Rfl: ;  glimepiride (AMARYL) 4 MG tablet, Take 4 mg by mouth daily before breakfast., Disp: , Rfl:  HYDROcodone-acetaminophen (NORCO/VICODIN) 5-325 MG per tablet, Take 1 tablet by mouth every 6 (six) hours as needed for pain., Disp: 60 tablet, Rfl: 0;  iron polysaccharides (NIFEREX) 150 MG capsule, Take 1 capsule (150 mg total) by mouth daily., Disp: 30 capsule, Rfl: 3;  lovastatin (MEVACOR) 40 MG tablet, Take 40 mg by mouth daily., Disp: , Rfl:  Oyster Shell (OYSTER CALCIUM) 500 MG TABS tablet, Take 500 mg of elemental calcium by mouth 2 (two) times daily., Disp: , Rfl: ;  pioglitazone (ACTOS) 45 MG tablet, Take 45 mg by mouth daily., Disp: , Rfl: ;  valsartan (DIOVAN) 80 MG tablet, Take 80 mg by mouth daily., Disp: , Rfl:  Current facility-administered medications:0.9 %  sodium chloride infusion, , Intravenous, Once, Koreen D Morgan, PA-C;  ceFAZolin (ANCEF) IVPB 2 g/50  mL premix, 2 g, Intravenous, Once, Berneta Levins, PA-C   Results for orders placed during the hospital encounter of 09/18/13  CULTURE, BLOOD (ROUTINE X 2)      Result Value Range   Specimen Description BLOOD RIGHT ARM     Special Requests BOTTLES DRAWN AEROBIC ONLY 5CC     Culture  Setup Time       Value: 09/19/2013 17:34     Performed at [facility]   Culture       Value: NO GROWTH 5 DAYS     Performed at [facility]   Report Status 09/25/2013 FINAL    CULTURE, BLOOD (ROUTINE X 2)      Result Value Range   Specimen Description BLOOD LEFT ARM     Special Requests BOTTLES DRAWN AEROBIC ONLY 8CC     Culture  Setup Time       Value: 09/19/2013 17:34     Performed at [facility]   Culture       Value: NO GROWTH 5 DAYS     Performed at [facility]   Report Status 09/25/2013 FINAL    MRSA PCR SCREENING      Result Value Range   MRSA by PCR NEGATIVE  NEGATIVE  URINE CULTURE      Result Value Range   Specimen Description URINE, CLEAN CATCH     Special Requests NONE  Culture  Setup Time       Value: 09/20/2013 13:24     Performed at [facility] Count       Value: NO GROWTH     Performed at [facility]   Culture       Value: NO GROWTH     Performed at [facility]   Report Status 09/21/2013 FINAL    COMPREHENSIVE METABOLIC PANEL      Result Value Range   Sodium 137  135 - 145 mEq/L   Potassium 2.9 (*) 3.5 - 5.1 mEq/L   Chloride 104  96 - 112 mEq/L   CO2 17 (*) 19 - 32 mEq/L   Glucose, Bld 111 (*) 70 - 99 mg/dL   BUN 8  6 - 23 mg/dL   Creatinine, Ser 1.61 (*) 0.50 - 1.10 mg/dL   Calcium 8.2 (*) 8.4 - 10.5 mg/dL   Total Protein 7.8  6.0 - 8.3 g/dL   Albumin 3.3 (*) 3.5 - 5.2 g/dL   AST 20  0 - 37 U/L   ALT 6  0 - 35 U/L   Alkaline Phosphatase 76  39 - 117 U/L   Total Bilirubin 0.2 (*) 0.3 - 1.2 mg/dL   GFR calc non Af Amer >90  >90 mL/min   GFR calc Af Amer >90  >90 mL/min  URINALYSIS, ROUTINE  W REFLEX MICROSCOPIC      Result Value Range   Color, Urine YELLOW  YELLOW   APPearance CLEAR  CLEAR   Specific Gravity, Urine 1.012  1.005 - 1.030   pH 6.5  5.0 - 8.0   Glucose, UA 500 (*) NEGATIVE mg/dL   Hgb urine dipstick NEGATIVE  NEGATIVE   Bilirubin Urine NEGATIVE  NEGATIVE   Ketones, ur NEGATIVE  NEGATIVE mg/dL   Protein, ur NEGATIVE  NEGATIVE mg/dL   Urobilinogen, UA 0.2  0.0 - 1.0 mg/dL   Nitrite POSITIVE (*) NEGATIVE   Leukocytes, UA NEGATIVE  NEGATIVE  ACETAMINOPHEN LEVEL      Result Value Range   Acetaminophen (Tylenol), Serum <15.0  10 - 30 ug/mL  SALICYLATE LEVEL      Result Value Range   Salicylate Lvl 9.0  2.8 - 20.0 mg/dL  URINE RAPID DRUG SCREEN (HOSP PERFORMED)      Result Value Range   Opiates POSITIVE (*) NONE DETECTED   Cocaine NONE DETECTED  NONE DETECTED   Benzodiazepines NONE DETECTED  NONE DETECTED   Amphetamines NONE DETECTED  NONE DETECTED   Tetrahydrocannabinol NONE DETECTED  NONE DETECTED   Barbiturates NONE DETECTED  NONE DETECTED  TSH      Result Value Range   TSH 0.477  0.350 - 4.500 uIU/mL  GLUCOSE, CAPILLARY      Result Value Range   Glucose-Capillary 27 (*) 70 - 99 mg/dL  GLUCOSE, CAPILLARY      Result Value Range   Glucose-Capillary 132 (*) 70 - 99 mg/dL  URINE MICROSCOPIC-ADD ON      Result Value Range   Squamous Epithelial / LPF FEW (*) RARE   WBC, UA 0-2  <3 WBC/hpf   Bacteria, UA MANY (*) RARE  GLUCOSE, CAPILLARY      Result Value Range   Glucose-Capillary 82  70 - 99 mg/dL  GLUCOSE, CAPILLARY      Result Value Range   Glucose-Capillary 132 (*) 70 - 99 mg/dL  LACTIC ACID, PLASMA      Result Value Range   Lactic  Acid, Venous 1.1  0.5 - 2.2 mmol/L  PROTIME-INR      Result Value Range   Prothrombin Time 12.6  11.6 - 15.2 seconds   INR 0.96  0.00 - 1.49  MAGNESIUM      Result Value Range   Magnesium 1.7  1.5 - 2.5 mg/dL  CORTISOL      Result Value Range   Cortisol, Plasma 12.0    COMPREHENSIVE METABOLIC PANEL       Result Value Range   Sodium 140  135 - 145 mEq/L   Potassium 3.6  3.5 - 5.1 mEq/L   Chloride 109  96 - 112 mEq/L   CO2 21  19 - 32 mEq/L   Glucose, Bld 80  70 - 99 mg/dL   BUN 6  6 - 23 mg/dL   Creatinine, Ser 1.61 (*) 0.50 - 1.10 mg/dL   Calcium 6.9 (*) 8.4 - 10.5 mg/dL   Total Protein 5.9 (*) 6.0 - 8.3 g/dL   Albumin 2.6 (*) 3.5 - 5.2 g/dL   AST 19  0 - 37 U/L   ALT 6  0 - 35 U/L   Alkaline Phosphatase 58  39 - 117 U/L   Total Bilirubin 0.2 (*) 0.3 - 1.2 mg/dL   GFR calc non Af Amer >90  >90 mL/min   GFR calc Af Amer >90  >90 mL/min  CBC WITH DIFFERENTIAL      Result Value Range   WBC 9.3  4.0 - 10.5 K/uL   RBC 3.39 (*) 3.87 - 5.11 MIL/uL   Hemoglobin 8.9 (*) 12.0 - 15.0 g/dL   HCT 09.6 (*) 04.5 - 40.9 %   MCV 76.4 (*) 78.0 - 100.0 fL   MCH 26.3  26.0 - 34.0 pg   MCHC 34.4  30.0 - 36.0 g/dL   RDW 81.1  91.4 - 78.2 %   Platelets 268  150 - 400 K/uL   Neutrophils Relative % 80 (*) 43 - 77 %   Neutro Abs 7.4  1.7 - 7.7 K/uL   Lymphocytes Relative 15  12 - 46 %   Lymphs Abs 1.4  0.7 - 4.0 K/uL   Monocytes Relative 5  3 - 12 %   Monocytes Absolute 0.4  0.1 - 1.0 K/uL   Eosinophils Relative 1  0 - 5 %   Eosinophils Absolute 0.1  0.0 - 0.7 K/uL   Basophils Relative 0  0 - 1 %   Basophils Absolute 0.0  0.0 - 0.1 K/uL  CK      Result Value Range   Total CK 166  7 - 177 U/L  GLUCOSE, CAPILLARY      Result Value Range   Glucose-Capillary 97  70 - 99 mg/dL  CBC      Result Value Range   WBC 8.6  4.0 - 10.5 K/uL   RBC 3.46 (*) 3.87 - 5.11 MIL/uL   Hemoglobin 9.0 (*) 12.0 - 15.0 g/dL   HCT 95.6 (*) 21.3 - 08.6 %   MCV 76.9 (*) 78.0 - 100.0 fL   MCH 26.0  26.0 - 34.0 pg   MCHC 33.8  30.0 - 36.0 g/dL   RDW 57.8  46.9 - 62.9 %   Platelets 272  150 - 400 K/uL  OCCULT BLOOD X 1 CARD TO LAB, STOOL      Result Value Range   Fecal Occult Bld POSITIVE (*) NEGATIVE  GLUCOSE, CAPILLARY      Result Value Range  Glucose-Capillary 72  70 - 99 mg/dL  GLUCOSE, CAPILLARY      Result  Value Range   Glucose-Capillary 77  70 - 99 mg/dL  CBC      Result Value Range   WBC 7.4  4.0 - 10.5 K/uL   RBC 3.26 (*) 3.87 - 5.11 MIL/uL   Hemoglobin 8.6 (*) 12.0 - 15.0 g/dL   HCT 30.8 (*) 65.7 - 84.6 %   MCV 76.7 (*) 78.0 - 100.0 fL   MCH 26.4  26.0 - 34.0 pg   MCHC 34.4  30.0 - 36.0 g/dL   RDW 96.2  95.2 - 84.1 %   Platelets 255  150 - 400 K/uL  GLUCOSE, CAPILLARY      Result Value Range   Glucose-Capillary 109 (*) 70 - 99 mg/dL  CBC WITH DIFFERENTIAL      Result Value Range   WBC 8.4  4.0 - 10.5 K/uL   RBC 3.25 (*) 3.87 - 5.11 MIL/uL   Hemoglobin 8.5 (*) 12.0 - 15.0 g/dL   HCT 32.4 (*) 40.1 - 02.7 %   MCV 77.5 (*) 78.0 - 100.0 fL   MCH 26.2  26.0 - 34.0 pg   MCHC 33.7  30.0 - 36.0 g/dL   RDW 25.3  66.4 - 40.3 %   Platelets 279  150 - 400 K/uL   Neutrophils Relative % 63  43 - 77 %   Neutro Abs 5.3  1.7 - 7.7 K/uL   Lymphocytes Relative 29  12 - 46 %   Lymphs Abs 2.4  0.7 - 4.0 K/uL   Monocytes Relative 5  3 - 12 %   Monocytes Absolute 0.4  0.1 - 1.0 K/uL   Eosinophils Relative 3  0 - 5 %   Eosinophils Absolute 0.3  0.0 - 0.7 K/uL   Basophils Relative 0  0 - 1 %   Basophils Absolute 0.0  0.0 - 0.1 K/uL  GLUCOSE, CAPILLARY      Result Value Range   Glucose-Capillary 119 (*) 70 - 99 mg/dL  GLUCOSE, CAPILLARY      Result Value Range   Glucose-Capillary 133 (*) 70 - 99 mg/dL  GLUCOSE, CAPILLARY      Result Value Range   Glucose-Capillary 85  70 - 99 mg/dL  URINALYSIS, ROUTINE W REFLEX MICROSCOPIC      Result Value Range   Color, Urine YELLOW  YELLOW   APPearance CLEAR  CLEAR   Specific Gravity, Urine 1.013  1.005 - 1.030   pH 5.5  5.0 - 8.0   Glucose, UA NEGATIVE  NEGATIVE mg/dL   Hgb urine dipstick NEGATIVE  NEGATIVE   Bilirubin Urine NEGATIVE  NEGATIVE   Ketones, ur 15 (*) NEGATIVE mg/dL   Protein, ur NEGATIVE  NEGATIVE mg/dL   Urobilinogen, UA 0.2  0.0 - 1.0 mg/dL   Nitrite NEGATIVE  NEGATIVE   Leukocytes, UA NEGATIVE  NEGATIVE  GLUCOSE, CAPILLARY       Result Value Range   Glucose-Capillary 109 (*) 70 - 99 mg/dL  CBC      Result Value Range   WBC 7.5  4.0 - 10.5 K/uL   RBC 4.10  3.87 - 5.11 MIL/uL   Hemoglobin 10.9 (*) 12.0 - 15.0 g/dL   HCT 47.4 (*) 25.9 - 56.3 %   MCV 76.3 (*) 78.0 - 100.0 fL   MCH 26.6  26.0 - 34.0 pg   MCHC 34.8  30.0 - 36.0 g/dL   RDW 87.5  64.3 -  15.5 %   Platelets 312  150 - 400 K/uL  GLUCOSE, CAPILLARY      Result Value Range   Glucose-Capillary 199 (*) 70 - 99 mg/dL  GLUCOSE, CAPILLARY      Result Value Range   Glucose-Capillary 121 (*) 70 - 99 mg/dL  GLUCOSE, CAPILLARY      Result Value Range   Glucose-Capillary 151 (*) 70 - 99 mg/dL  GLUCOSE, CAPILLARY      Result Value Range   Glucose-Capillary 130 (*) 70 - 99 mg/dL  POCT I-STAT 3, BLOOD GAS (G3+)      Result Value Range   pH, Arterial 7.397  7.350 - 7.450   pCO2 arterial 36.8  35.0 - 45.0 mmHg   pO2, Arterial 360.0 (*) 80.0 - 100.0 mmHg   Bicarbonate 22.6  20.0 - 24.0 mEq/L   TCO2 24  0 - 100 mmol/L   O2 Saturation 100.0     Acid-base deficit 2.0  0.0 - 2.0 mmol/L   Patient temperature 98.6 F     Collection site RADIAL, ALLEN'S TEST ACCEPTABLE     Drawn by Operator     Sample type ARTERIAL    POCT I-STAT TROPONIN I      Result Value Range   Troponin i, poc 0.07  0.00 - 0.08 ng/mL   Comment 3           TYPE AND SCREEN      Result Value Range   ABO/RH(D) A POS     Antibody Screen NEG     Sample Expiration 09/22/2013    ABO/RH      Result Value Range   ABO/RH(D) A POS    09/27/13 labs pending  Review of Systems  Constitutional: Negative for fever and chills.  HENT: Positive for hearing loss.   Respiratory: Negative for cough and shortness of breath.   Cardiovascular: Negative for chest pain.  Gastrointestinal: Negative for nausea, vomiting and abdominal pain.  Musculoskeletal: Positive for back pain.  Neurological: Negative for headaches.  Endo/Heme/Allergies: Does not bruise/bleed easily.    Blood pressure 169/59, pulse 77,  temperature 98.2 F (36.8 C), temperature source Oral, resp. rate 18, SpO2 98.00%. Physical Exam  Constitutional: She is oriented to person, place, and time. She appears well-developed and well-nourished.  Cardiovascular: Normal rate and regular rhythm.   Respiratory: Effort normal and breath sounds normal.  GI: Soft. Bowel sounds are normal. There is no tenderness.  Musculoskeletal: Normal range of motion.  Trace bilateral pretibial edema; mild to mod paravertebral tenderness at L5 region  Neurological: She is alert and oriented to person, place, and time.     Assessment/Plan Pt with hx of low back pain and previous kyphoplasties at L1, L2 levels; now with acute symptomatic L5 compression fracture. Plan is for L5 kyphoplasty with possible biopsy today. Details/risks of procedure d/w pt/husband via interpreter with their understanding and consent.  Tami Barren,D KEVIN 09/27/2013, 10:08 AM

## 2013-09-30 ENCOUNTER — Other Ambulatory Visit (HOSPITAL_COMMUNITY): Payer: Self-pay | Admitting: Interventional Radiology

## 2013-09-30 DIAGNOSIS — IMO0002 Reserved for concepts with insufficient information to code with codable children: Secondary | ICD-10-CM

## 2013-09-30 DIAGNOSIS — M549 Dorsalgia, unspecified: Secondary | ICD-10-CM

## 2013-10-12 ENCOUNTER — Ambulatory Visit (HOSPITAL_COMMUNITY)
Admission: RE | Admit: 2013-10-12 | Discharge: 2013-10-12 | Disposition: A | Discharge status: Home / Self Care | Payer: Medicare Other | Source: Ambulatory Visit | Attending: Interventional Radiology | Admitting: Interventional Radiology

## 2013-10-12 DIAGNOSIS — M549 Dorsalgia, unspecified: Secondary | ICD-10-CM

## 2013-10-12 DIAGNOSIS — IMO0002 Reserved for concepts with insufficient information to code with codable children: Secondary | ICD-10-CM

## 2014-12-12 ENCOUNTER — Other Ambulatory Visit: Payer: Self-pay | Admitting: Internal Medicine

## 2014-12-12 DIAGNOSIS — Z1231 Encounter for screening mammogram for malignant neoplasm of breast: Secondary | ICD-10-CM

## 2015-03-14 ENCOUNTER — Ambulatory Visit
Admission: RE | Admit: 2015-03-14 | Discharge: 2015-03-14 | Disposition: A | Discharge status: Home / Self Care | Payer: Medicare Other | Source: Ambulatory Visit | Attending: Internal Medicine | Admitting: Internal Medicine

## 2015-03-14 DIAGNOSIS — Z1231 Encounter for screening mammogram for malignant neoplasm of breast: Secondary | ICD-10-CM

## 2015-06-19 ENCOUNTER — Emergency Department (HOSPITAL_COMMUNITY)
Admission: EM | Admit: 2015-06-19 | Discharge: 2015-06-19 | Disposition: A | Discharge status: Home / Self Care | Payer: Medicare Other | Attending: Emergency Medicine | Admitting: Emergency Medicine

## 2015-06-19 ENCOUNTER — Encounter (HOSPITAL_COMMUNITY): Payer: Self-pay | Admitting: *Deleted

## 2015-06-19 DIAGNOSIS — I10 Essential (primary) hypertension: Secondary | ICD-10-CM | POA: Diagnosis not present

## 2015-06-19 DIAGNOSIS — M1990 Unspecified osteoarthritis, unspecified site: Secondary | ICD-10-CM | POA: Insufficient documentation

## 2015-06-19 DIAGNOSIS — E785 Hyperlipidemia, unspecified: Secondary | ICD-10-CM | POA: Diagnosis not present

## 2015-06-19 DIAGNOSIS — M5416 Radiculopathy, lumbar region: Secondary | ICD-10-CM | POA: Diagnosis not present

## 2015-06-19 DIAGNOSIS — H269 Unspecified cataract: Secondary | ICD-10-CM | POA: Diagnosis not present

## 2015-06-19 DIAGNOSIS — M533 Sacrococcygeal disorders, not elsewhere classified: Secondary | ICD-10-CM | POA: Diagnosis present

## 2015-06-19 DIAGNOSIS — Z79899 Other long term (current) drug therapy: Secondary | ICD-10-CM | POA: Insufficient documentation

## 2015-06-19 DIAGNOSIS — E119 Type 2 diabetes mellitus without complications: Secondary | ICD-10-CM | POA: Insufficient documentation

## 2015-06-19 DIAGNOSIS — M5430 Sciatica, unspecified side: Secondary | ICD-10-CM | POA: Insufficient documentation

## 2015-06-19 MED ORDER — HYDROCODONE-ACETAMINOPHEN 5-325 MG PO TABS: 1.0000 | ORAL_TABLET | Freq: Four times a day (QID) | ORAL | Status: DC | PRN

## 2015-06-19 MED ORDER — PREDNISONE 10 MG PO TABS
20.0000 mg | ORAL_TABLET | Freq: Every day | ORAL | Status: AC
Start: 2015-06-19 — End: 2015-06-24

## 2015-06-19 NOTE — ED Notes (Signed)
Interpreter phone used for discharge

## 2015-06-19 NOTE — ED Provider Notes (Signed)
CSN: [id number]     Arrival date & time 06/19/15  4098 History   First MD Initiated Contact with Patient 06/19/15 [phone]     Chief Complaint  Patient presents with  . Back Pain   (Consider location/radiation/quality/duration/timing/severity/associated sxs/prior Treatment) Patient is a 74 y.o. female presenting with back pain. A language interpreter was used (telephone and bedside [place] interpreter).  Back Pain Location:  Sacro-iliac joint Quality:  Aching Radiates to:  Does not radiate Pain severity:  Moderate Onset quality:  Gradual Timing:  Intermittent Progression:  Waxing and waning Chronicity:  Recurrent Relieved by:  Nothing Worsened by:  Ambulation Ineffective treatments:  None tried Associated symptoms: no abdominal pain, no bladder incontinence, no bowel incontinence, no chest pain, no dysuria, no fever, no headaches, no numbness, no paresthesias and no weakness   Risk factors: no hx of cancer, not obese, no recent surgery and no vascular disease     Past Medical History  Diagnosis Date  . Hypertension   . Diabetes mellitus without complication   . Arthritis   . Hyperlipidemia   . Cataracts, bilateral    Past Surgical History  Procedure Laterality Date  . Kyphoplasty     Family History  Problem Relation Age of Onset  . Other Neg Hx    History  Substance Use Topics  . Smoking status: Never Smoker   . Smokeless tobacco: Not on file  . Alcohol Use: No   OB History    No data available     Review of Systems  Constitutional: Negative for fever, chills, diaphoresis and fatigue.  Respiratory: Negative for cough, chest tightness, shortness of breath and wheezing.   Cardiovascular: Negative for chest pain.  Gastrointestinal: Negative for nausea, vomiting, abdominal pain and bowel incontinence.  Genitourinary: Negative for bladder incontinence and dysuria.  Musculoskeletal: Positive for back pain and gait problem. Negative for myalgias.  Neurological:  Negative for facial asymmetry, weakness, light-headedness, numbness, headaches and paresthesias.  Psychiatric/Behavioral: Negative for confusion and decreased concentration.  All other systems reviewed and are negative.   Allergies  Review of patient's allergies indicates no known allergies.  Home Medications   Prior to Admission medications   Medication Sig Start Date End Date Taking? Authorizing Provider  alendronate (FOSAMAX) 70 MG tablet Take 70 mg by mouth every 7 (seven) days. Take with a full glass of water on an empty stomach.    Historical Provider, MD  cholecalciferol (VITAMIN D) 1000 UNITS tablet Take 1,000 Units by mouth daily.    Historical Provider, MD  glimepiride (AMARYL) 4 MG tablet Take 4 mg by mouth daily before breakfast.    Historical Provider, MD  HYDROcodone-acetaminophen (NORCO/VICODIN) 5-325 MG per tablet Take 1 tablet by mouth every 6 (six) hours as needed for pain. 09/21/13   Georgann Housekeeper, MD  iron polysaccharides (NIFEREX) 150 MG capsule Take 1 capsule (150 mg total) by mouth daily. 09/21/13   Georgann Housekeeper, MD  lovastatin (MEVACOR) 40 MG tablet Take 40 mg by mouth daily.    Historical Provider, MD  Ethelda Chick (OYSTER CALCIUM) 500 MG TABS tablet Take 500 mg of elemental calcium by mouth 2 (two) times daily.    Historical Provider, MD  pioglitazone (ACTOS) 45 MG tablet Take 45 mg by mouth daily.    Historical Provider, MD  valsartan (DIOVAN) 80 MG tablet Take 80 mg by mouth daily.    Historical Provider, MD   BP 195/68 mmHg  Pulse 74  Temp(Src) 98.1 F (36.7 C) (Oral)  Resp  18  Wt 140 lb (63.504 kg)  SpO2 97% Filed Vitals:   06/19/15 0829 06/19/15 0830 06/19/15 0900 06/19/15 0930  BP:  188/68 187/65 186/57  Pulse:  72 75 83  Temp:      TempSrc:      Resp:      Weight: 140 lb (63.504 kg)     SpO2:  96% 93% 98%    Physical Exam  Constitutional: She is oriented to person, place, and time. She appears well-developed and well-nourished. No distress.   HENT:  Head: Normocephalic and atraumatic.  Nose: Nose normal.  Mouth/Throat: Oropharynx is clear and moist. No oropharyngeal exudate.  Eyes: EOM are normal. Pupils are equal, round, and reactive to light.  Neck: Normal range of motion. Neck supple.  Cardiovascular: Normal rate, regular rhythm, normal heart sounds and intact distal pulses.   No murmur heard. Pulmonary/Chest: Effort normal and breath sounds normal. No respiratory distress. She has no wheezes. She exhibits no tenderness.  Abdominal: Soft. There is no tenderness. There is no rebound and no guarding.  Genitourinary:  No saddle anesthesia  Musculoskeletal: Normal range of motion. She exhibits no tenderness.  Minimal tenderness to palpation of bilateral SI joints.  No midline C/T/L spine tenderness.  Negative straight leg raise test bilaterally.      Able to ambulate steadily and bear weight on bilateral lower extremities.   Lymphadenopathy:    She has no cervical adenopathy.  Neurological: She is alert and oriented to person, place, and time. No cranial nerve deficit. Coordination normal.  Skin: Skin is warm and dry. She is not diaphoretic.  Psychiatric: She has a normal mood and affect. Her behavior is normal. Judgment and thought content normal.  Nursing note and vitals reviewed.    ED Course  Procedures (including critical care time) Labs Review Labs Reviewed - No data to display  Imaging Review No results found.   EKG Interpretation None      MDM   Final diagnoses:  Lumbar back pain with radiculopathy affecting left lower extremity  Sciatica, unspecified laterality   Pt is a 74 yo [place] F with hx of HTN, HLD, L1-2 and L5 kyphoplasties, and depression who presents for bilateral lateral lower back pain that has been intermittent for the past few weeks and worsening for the past few days.  Complains of intermittent lower back/posterior hip pain that radiates down her posterior left lower extremity.  Worse  with ambulation but able to walk.  Denies numbness, tingling, weakness, or change in bathroom habits (no incontinence or retention).  Has not tried anything for the pain. No recent falls.  No fevers.    Bilateral mild pain to palpation of SI joints.  Negative straight leg raise test.  No saddle anesthesia. Full lower extremity strength and sensation.   Likely sciatica pain.  Given Rx for 5 days of steroid burst and short course of Norco.  Advised on typical time course and possibility of prolonged sx.  Encouraged return if she has prolonged pain, difficulty ambulating, fevers, weakness, or change in bowel/bladder habits.  All questions were answered with the help of a [place] telephone interpreter.  Discharged in stable condition.    Patient was seen with ED Attending, Dr. Littie DeedsGentry, who was present for the entire telephone interview.   Lenell AntuJamie Adelia Baptista, MD      Lenell AntuJamie Marshell Rieger, MD 06/19/15 1015  Mirian MoMatthew Gentry, MD 06/19/15 1017

## 2015-06-19 NOTE — ED Notes (Signed)
Family reports bilateral lower back/hip pain.  Lower legs noted to have swelling as well. Will use interpreter phone and secretary has called for an in person translator per family request.

## 2015-07-31 ENCOUNTER — Ambulatory Visit: Payer: Medicare Other | Admitting: Skilled Nursing Facility1

## 2015-08-01 ENCOUNTER — Encounter: Payer: Self-pay | Admitting: *Deleted

## 2015-08-01 ENCOUNTER — Encounter: Payer: Medicare Other | Attending: Internal Medicine | Admitting: *Deleted

## 2015-08-01 DIAGNOSIS — Z713 Dietary counseling and surveillance: Secondary | ICD-10-CM | POA: Insufficient documentation

## 2015-08-01 DIAGNOSIS — E119 Type 2 diabetes mellitus without complications: Secondary | ICD-10-CM | POA: Diagnosis not present

## 2015-08-01 NOTE — Progress Notes (Signed)
Diabetes Self-Management Education  Visit Type: First/Initial  Appt. Start Time: 1100 Appt. End Time: 1200  08/01/2015  [name] Ill, identified by name and date of birth, is a 74 y.o. female with a diagnosis of Diabetes: Type 2.   ASSESSMENT       Diabetes Self-Management Education - 08/01/15 1105    Visit Information   Visit Type First/Initial   Initial Visit   Diabetes Type Type 2   Are you currently following a meal plan? No   Are you taking your medications as prescribed? Yes   Health Coping   How would you rate your overall health? Good   Psychosocial Assessment   Patient Belief/Attitude about Diabetes Other (comment)  Uneducated on diabetes management   Self-care barriers Hard of hearing   Self-management support None   Other persons present Interpreter   Special Needs Other (comment)  ESL   Preferred Learning Style --  doesn't understand the question   Learning Readiness --  doesn't understand   How often do you need to have someone help you when you read instructions, pamphlets, or other written materials from your doctor or pharmacy? 5 - Always   What is the last grade level you completed in school? 8/9th grade   Complications   Last HgB A1C per patient/outside source 5.8 %   How often do you check your blood sugar? 3-4 times / week   Fasting Blood glucose range (mg/dL) 16-109   Postprandial Blood glucose range (mg/dL) --  doesn't check postprandially   Number of hypoglycemic episodes per month 0   Number of hyperglycemic episodes per week 0   Have you had a dilated eye exam in the past 12 months? Yes   Have you had a dental exam in the past 12 months? No  full set dentures   Are you checking your feet? No   Dietary Intake   Breakfast kimchee noodles   Lunch rice, fish, vegetable   Dinner starchy powder (potato) mixed with water and splenda   Beverage(s) water   Exercise   Exercise Type Other (comment)  non ambulatory   Patient Education   Previous Diabetes Education No   Nutrition management  Role of diet in the treatment of diabetes and the relationship between the three main macronutrients and blood glucose level   Acute complications Taught treatment of hypoglycemia - the 15 rule.   Individualized Goals (developed by patient)   Nutrition Follow meal plan discussed   Monitoring  Other (comment)  dispose of lancets properly   Outcomes   Expected Outcomes Other (comment)  was not able to demonstrate understanding   Future DMSE PRN   Program Status Completed      Individualized Plan for Diabetes Self-Management Training:   Learning Objective:  Patient will have a greater understanding of diabetes self-management. Patient education plan is to attend individual and/or group sessions per assessed needs and concerns.   Plan:   Dispose of used lancets properly: strong plastic container or take to medical provider office for disposal.  Do not throw in trash Follow MyPlate recommendations: protein and vegetables with all starches  Expected Outcomes:  Other (comment) (was not able to demonstrate understanding)  Education material provided: My Plate, DSME materials in [place]  If problems or questions, patient to contact team via:  Phone  Future DSME appointment: PRN

## 2016-12-28 ENCOUNTER — Inpatient Hospital Stay (HOSPITAL_COMMUNITY)
Admission: EM | Admit: 2016-12-28 | Discharge: 2017-01-02 | DRG: 246 | Disposition: A | Discharge status: Home / Self Care | Payer: Medicare Other | Attending: Family Medicine | Admitting: Family Medicine

## 2016-12-28 ENCOUNTER — Encounter (HOSPITAL_COMMUNITY): Payer: Self-pay

## 2016-12-28 ENCOUNTER — Emergency Department (HOSPITAL_COMMUNITY): Payer: Medicare Other

## 2016-12-28 DIAGNOSIS — M7551 Bursitis of right shoulder: Secondary | ICD-10-CM | POA: Diagnosis present

## 2016-12-28 DIAGNOSIS — Z955 Presence of coronary angioplasty implant and graft: Secondary | ICD-10-CM | POA: Diagnosis not present

## 2016-12-28 DIAGNOSIS — H269 Unspecified cataract: Secondary | ICD-10-CM | POA: Diagnosis present

## 2016-12-28 DIAGNOSIS — E871 Hypo-osmolality and hyponatremia: Secondary | ICD-10-CM | POA: Diagnosis present

## 2016-12-28 DIAGNOSIS — Z7983 Long term (current) use of bisphosphonates: Secondary | ICD-10-CM

## 2016-12-28 DIAGNOSIS — E785 Hyperlipidemia, unspecified: Secondary | ICD-10-CM | POA: Diagnosis present

## 2016-12-28 DIAGNOSIS — D72829 Elevated white blood cell count, unspecified: Secondary | ICD-10-CM

## 2016-12-28 DIAGNOSIS — Z789 Other specified health status: Secondary | ICD-10-CM | POA: Diagnosis present

## 2016-12-28 DIAGNOSIS — M7531 Calcific tendinitis of right shoulder: Secondary | ICD-10-CM | POA: Diagnosis present

## 2016-12-28 DIAGNOSIS — E860 Dehydration: Secondary | ICD-10-CM | POA: Diagnosis present

## 2016-12-28 DIAGNOSIS — J181 Lobar pneumonia, unspecified organism: Secondary | ICD-10-CM | POA: Diagnosis present

## 2016-12-28 DIAGNOSIS — I2510 Atherosclerotic heart disease of native coronary artery without angina pectoris: Secondary | ICD-10-CM | POA: Diagnosis present

## 2016-12-28 DIAGNOSIS — L8990 Pressure ulcer of unspecified site, unspecified stage: Secondary | ICD-10-CM | POA: Insufficient documentation

## 2016-12-28 DIAGNOSIS — H9190 Unspecified hearing loss, unspecified ear: Secondary | ICD-10-CM

## 2016-12-28 DIAGNOSIS — I10 Essential (primary) hypertension: Secondary | ICD-10-CM | POA: Diagnosis present

## 2016-12-28 DIAGNOSIS — N3000 Acute cystitis without hematuria: Secondary | ICD-10-CM

## 2016-12-28 DIAGNOSIS — D696 Thrombocytopenia, unspecified: Secondary | ICD-10-CM | POA: Diagnosis present

## 2016-12-28 DIAGNOSIS — M19011 Primary osteoarthritis, right shoulder: Secondary | ICD-10-CM | POA: Diagnosis present

## 2016-12-28 DIAGNOSIS — M1990 Unspecified osteoarthritis, unspecified site: Secondary | ICD-10-CM | POA: Diagnosis not present

## 2016-12-28 DIAGNOSIS — I214 Non-ST elevation (NSTEMI) myocardial infarction: Secondary | ICD-10-CM | POA: Diagnosis present

## 2016-12-28 DIAGNOSIS — I350 Nonrheumatic aortic (valve) stenosis: Secondary | ICD-10-CM

## 2016-12-28 DIAGNOSIS — E7800 Pure hypercholesterolemia, unspecified: Secondary | ICD-10-CM | POA: Diagnosis not present

## 2016-12-28 DIAGNOSIS — E1151 Type 2 diabetes mellitus with diabetic peripheral angiopathy without gangrene: Secondary | ICD-10-CM | POA: Diagnosis present

## 2016-12-28 DIAGNOSIS — Z79899 Other long term (current) drug therapy: Secondary | ICD-10-CM | POA: Diagnosis not present

## 2016-12-28 DIAGNOSIS — B9620 Unspecified Escherichia coli [E. coli] as the cause of diseases classified elsewhere: Secondary | ICD-10-CM | POA: Diagnosis present

## 2016-12-28 DIAGNOSIS — E559 Vitamin D deficiency, unspecified: Secondary | ICD-10-CM | POA: Diagnosis present

## 2016-12-28 DIAGNOSIS — N390 Urinary tract infection, site not specified: Secondary | ICD-10-CM | POA: Diagnosis present

## 2016-12-28 DIAGNOSIS — I480 Paroxysmal atrial fibrillation: Secondary | ICD-10-CM | POA: Diagnosis present

## 2016-12-28 DIAGNOSIS — R9431 Abnormal electrocardiogram [ECG] [EKG]: Secondary | ICD-10-CM | POA: Diagnosis present

## 2016-12-28 DIAGNOSIS — Z833 Family history of diabetes mellitus: Secondary | ICD-10-CM

## 2016-12-28 DIAGNOSIS — R7881 Bacteremia: Secondary | ICD-10-CM | POA: Diagnosis present

## 2016-12-28 DIAGNOSIS — J189 Pneumonia, unspecified organism: Secondary | ICD-10-CM | POA: Diagnosis present

## 2016-12-28 DIAGNOSIS — I4891 Unspecified atrial fibrillation: Secondary | ICD-10-CM | POA: Diagnosis not present

## 2016-12-28 DIAGNOSIS — E1165 Type 2 diabetes mellitus with hyperglycemia: Secondary | ICD-10-CM | POA: Diagnosis present

## 2016-12-28 DIAGNOSIS — E782 Mixed hyperlipidemia: Secondary | ICD-10-CM | POA: Diagnosis not present

## 2016-12-28 DIAGNOSIS — Z7984 Long term (current) use of oral hypoglycemic drugs: Secondary | ICD-10-CM

## 2016-12-28 DIAGNOSIS — R509 Fever, unspecified: Secondary | ICD-10-CM | POA: Diagnosis present

## 2016-12-28 DIAGNOSIS — M25519 Pain in unspecified shoulder: Secondary | ICD-10-CM

## 2016-12-28 DIAGNOSIS — R7989 Other specified abnormal findings of blood chemistry: Secondary | ICD-10-CM | POA: Diagnosis present

## 2016-12-28 DIAGNOSIS — R748 Abnormal levels of other serum enzymes: Secondary | ICD-10-CM | POA: Diagnosis not present

## 2016-12-28 DIAGNOSIS — I119 Hypertensive heart disease without heart failure: Secondary | ICD-10-CM | POA: Diagnosis not present

## 2016-12-28 DIAGNOSIS — R778 Other specified abnormalities of plasma proteins: Secondary | ICD-10-CM | POA: Diagnosis present

## 2016-12-28 DIAGNOSIS — R531 Weakness: Secondary | ICD-10-CM

## 2016-12-28 DIAGNOSIS — D649 Anemia, unspecified: Secondary | ICD-10-CM | POA: Diagnosis present

## 2016-12-28 DIAGNOSIS — Z7982 Long term (current) use of aspirin: Secondary | ICD-10-CM

## 2016-12-28 HISTORY — DX: Atherosclerotic heart disease of native coronary artery without angina pectoris: I25.10

## 2016-12-28 HISTORY — DX: Nonrheumatic aortic (valve) stenosis: I35.0

## 2016-12-28 HISTORY — DX: Type 2 diabetes mellitus without complications: E11.9

## 2016-12-28 HISTORY — DX: Essential (primary) hypertension: I10

## 2016-12-28 LAB — BLOOD GAS, ARTERIAL
ACID-BASE DEFICIT: 2.1 mmol/L — AB (ref 0.0–2.0)
Bicarbonate: 21 mmol/L (ref 20.0–28.0)
DRAWN BY: 331471
O2 SAT: 95.4 %
PATIENT TEMPERATURE: 98.6
pCO2 arterial: 31.4 mmHg — ABNORMAL LOW (ref 32.0–48.0)
pH, Arterial: 7.441 (ref 7.350–7.450)
pO2, Arterial: 77 mmHg — ABNORMAL LOW (ref 83.0–108.0)

## 2016-12-28 LAB — CBC WITH DIFFERENTIAL/PLATELET
BASOS ABS: 0 10*3/uL (ref 0.0–0.1)
BASOS PCT: 0 %
EOS PCT: 0 %
Eosinophils Absolute: 0 10*3/uL (ref 0.0–0.7)
HCT: 47.6 % — ABNORMAL HIGH (ref 36.0–46.0)
Hemoglobin: 16.9 g/dL — ABNORMAL HIGH (ref 12.0–15.0)
LYMPHS PCT: 3 %
Lymphs Abs: 0.4 10*3/uL — ABNORMAL LOW (ref 0.7–4.0)
MCH: 27.6 pg (ref 26.0–34.0)
MCHC: 35.5 g/dL (ref 30.0–36.0)
MCV: 77.7 fL — ABNORMAL LOW (ref 78.0–100.0)
Monocytes Absolute: 0.5 10*3/uL (ref 0.1–1.0)
Monocytes Relative: 5 %
NEUTROS ABS: 10.4 10*3/uL — AB (ref 1.7–7.7)
Neutrophils Relative %: 92 %
PLATELETS: 128 10*3/uL — AB (ref 150–400)
RBC: 6.13 MIL/uL — AB (ref 3.87–5.11)
RDW: 13.5 % (ref 11.5–15.5)
WBC: 11.3 10*3/uL — AB (ref 4.0–10.5)

## 2016-12-28 LAB — I-STAT TROPONIN, ED
Troponin i, poc: 5.01 ng/mL (ref 0.00–0.08)
Troponin i, poc: 4.16 ng/mL (ref 0.00–0.08)

## 2016-12-28 LAB — COMPREHENSIVE METABOLIC PANEL
ALK PHOS: 57 U/L (ref 38–126)
ALT: 35 U/L (ref 14–54)
ANION GAP: 9 (ref 5–15)
AST: 58 U/L — ABNORMAL HIGH (ref 15–41)
Albumin: 3.6 g/dL (ref 3.5–5.0)
BILIRUBIN TOTAL: 0.5 mg/dL (ref 0.3–1.2)
BUN: 26 mg/dL — AB (ref 6–20)
CALCIUM: 7.9 mg/dL — AB (ref 8.9–10.3)
CO2: 22 mmol/L (ref 22–32)
Chloride: 99 mmol/L — ABNORMAL LOW (ref 101–111)
Creatinine, Ser: 0.95 mg/dL (ref 0.44–1.00)
GFR calc Af Amer: >60 mL/min (ref >60)
GFR, EST NON AFRICAN AMERICAN: 56 mL/min — AB (ref >60)
Glucose, Bld: 168 mg/dL — ABNORMAL HIGH (ref 65–99)
POTASSIUM: 4.1 mmol/L (ref 3.5–5.1)
Sodium: 130 mmol/L — ABNORMAL LOW (ref 135–145)
TOTAL PROTEIN: 7.1 g/dL (ref 6.5–8.1)

## 2016-12-28 LAB — LIPID PANEL
CHOL/HDL RATIO: 2 ratio
Cholesterol: 94 mg/dL (ref 0–200)
HDL: 47 mg/dL (ref >40)
LDL CALC: 36 mg/dL (ref 0–99)
Triglycerides: 54 mg/dL (ref <150)
VLDL: 11 mg/dL (ref 0–40)

## 2016-12-28 LAB — URINALYSIS, ROUTINE W REFLEX MICROSCOPIC
BILIRUBIN URINE: NEGATIVE
GLUCOSE, UA: NEGATIVE mg/dL
Ketones, ur: 5 mg/dL — AB
NITRITE: POSITIVE — AB
PH: 6 (ref 5.0–8.0)
Protein, ur: 30 mg/dL — AB
SPECIFIC GRAVITY, URINE: 1.009 (ref 1.005–1.030)

## 2016-12-28 LAB — GLUCOSE, CAPILLARY: Glucose-Capillary: 205 mg/dL — ABNORMAL HIGH (ref 65–99)

## 2016-12-28 LAB — TROPONIN I
TROPONIN I: 4.87 ng/mL — AB (ref <0.03)
TROPONIN I: 4.72 ng/mL — AB (ref <0.03)

## 2016-12-28 LAB — PROTIME-INR
INR: 1.12
Prothrombin Time: 14.4 seconds (ref 11.4–15.2)

## 2016-12-28 LAB — CBG MONITORING, ED: Glucose-Capillary: 164 mg/dL — ABNORMAL HIGH (ref 65–99)

## 2016-12-28 LAB — MRSA PCR SCREENING: MRSA by PCR: NEGATIVE

## 2016-12-28 LAB — TSH: TSH: 0.497 u[IU]/mL (ref 0.350–4.500)

## 2016-12-28 MED ORDER — SODIUM CHLORIDE 0.9 % IV SOLN
INTRAVENOUS | Status: DC
  Administered 2016-12-28: 19:00:00 via INTRAVENOUS

## 2016-12-28 MED ORDER — DEXTROSE 5 % IV SOLN
1.0000 g | INTRAVENOUS | Status: DC
  Administered 2016-12-29 – 2016-12-30 (×2): 1 g via INTRAVENOUS
  Filled 2016-12-28 (×2): qty 10

## 2016-12-28 MED ORDER — ASPIRIN EC 81 MG PO TBEC
81.0000 mg | DELAYED_RELEASE_TABLET | Freq: Every day | ORAL | Status: DC
  Administered 2016-12-29 – 2017-01-02 (×4): 81 mg via ORAL
  Filled 2016-12-28 (×5): qty 1

## 2016-12-28 MED ORDER — ACETAMINOPHEN 500 MG PO TABS
1000.0000 mg | ORAL_TABLET | Freq: Once | ORAL | Status: AC
  Administered 2016-12-28: 1000 mg via ORAL
  Filled 2016-12-28: qty 2

## 2016-12-28 MED ORDER — INSULIN ASPART 100 UNIT/ML ~~LOC~~ SOLN
3.0000 [IU] | Freq: Three times a day (TID) | SUBCUTANEOUS | Status: DC
  Administered 2016-12-30 – 2017-01-02 (×6): 3 [IU] via SUBCUTANEOUS

## 2016-12-28 MED ORDER — INSULIN ASPART 100 UNIT/ML ~~LOC~~ SOLN
0.0000 [IU] | Freq: Three times a day (TID) | SUBCUTANEOUS | Status: DC
  Administered 2016-12-29 (×2): 5 [IU] via SUBCUTANEOUS
  Administered 2016-12-30 (×2): 3 [IU] via SUBCUTANEOUS
  Administered 2016-12-31: 2 [IU] via SUBCUTANEOUS
  Administered 2017-01-01: 3 [IU] via SUBCUTANEOUS
  Administered 2017-01-01: 07:00:00 2 [IU] via SUBCUTANEOUS
  Administered 2017-01-02 (×2): 3 [IU] via SUBCUTANEOUS

## 2016-12-28 MED ORDER — SODIUM CHLORIDE 0.9 % IV BOLUS (SEPSIS)
1000.0000 mL | Freq: Once | INTRAVENOUS | Status: AC
  Administered 2016-12-28: 1000 mL via INTRAVENOUS

## 2016-12-28 MED ORDER — HEPARIN (PORCINE) IN NACL 100-0.45 UNIT/ML-% IJ SOLN
800.0000 [IU]/h | INTRAMUSCULAR | Status: DC
  Administered 2016-12-28: 700 [IU]/h via INTRAVENOUS
  Filled 2016-12-28: qty 250

## 2016-12-28 MED ORDER — HEPARIN BOLUS VIA INFUSION
3500.0000 [IU] | Freq: Once | INTRAVENOUS | Status: AC
  Administered 2016-12-28: 3500 [IU] via INTRAVENOUS
  Filled 2016-12-28: qty 3500

## 2016-12-28 MED ORDER — NITROGLYCERIN 0.4 MG SL SUBL
0.4000 mg | SUBLINGUAL_TABLET | SUBLINGUAL | Status: DC | PRN
Start: 2016-12-28 — End: 2017-01-02

## 2016-12-28 MED ORDER — DEXTROSE 5 % IV SOLN
2.0000 g | Freq: Once | INTRAVENOUS | Status: AC
  Administered 2016-12-28: 2 g via INTRAVENOUS
  Filled 2016-12-28: qty 2

## 2016-12-28 MED ORDER — MORPHINE SULFATE (PF) 2 MG/ML IV SOLN
1.0000 mg | INTRAVENOUS | Status: DC | PRN
Start: 2016-12-28 — End: 2017-01-02

## 2016-12-28 MED ORDER — DEXTROSE 5 % IV SOLN
500.0000 mg | INTRAVENOUS | Status: DC
  Administered 2016-12-29: 500 mg via INTRAVENOUS
  Filled 2016-12-28: qty 500

## 2016-12-28 MED ORDER — DEXTROSE 5 % IV SOLN
500.0000 mg | Freq: Once | INTRAVENOUS | Status: AC
  Administered 2016-12-28: 500 mg via INTRAVENOUS
  Filled 2016-12-28: qty 500

## 2016-12-28 MED ORDER — ASPIRIN 81 MG PO CHEW
324.0000 mg | CHEWABLE_TABLET | Freq: Once | ORAL | Status: AC
  Administered 2016-12-28: 324 mg via ORAL
  Filled 2016-12-28: qty 4

## 2016-12-28 MED ORDER — SODIUM CHLORIDE 0.9 % IV BOLUS (SEPSIS)
500.0000 mL | Freq: Once | INTRAVENOUS | Status: AC
  Administered 2016-12-28: 500 mL via INTRAVENOUS

## 2016-12-28 NOTE — Progress Notes (Signed)
ANTICOAGULATION CONSULT NOTE - Initial Consult  Pharmacy Consult for heparin Indication: chest pain/ACS  No Known Allergies  Patient Measurements: Height: 5' (152.4 cm) Weight: 145 lb (65.8 kg) IBW/kg (Calculated) : 45.5 Heparin Dosing Weight: 60 kg  Vital Signs: Temp: 101 F (38.3 C) (01/20 1614) Temp Source: Rectal (01/20 1614) BP: 132/65 (01/20 1700) Pulse Rate: 83 (01/20 1615)  Labs:  Recent Labs  12/28/16 1510  HGB 16.9*  HCT 47.6*  PLT 128*  LABPROT 14.4  INR 1.12  CREATININE 0.95    Estimated Creatinine Clearance: 42 mL/min (by C-G formula based on SCr of 0.95 mg/dL).   Medical History: Past Medical History:  Diagnosis Date  . Arthritis   . Cataracts, bilateral   . Diabetes mellitus without complication (HCC)   . Hyperlipidemia   . Hypertension     Assessment: Patient is a  76 y.o F presented to the ED on 12/28/16 with c/o decreased appetite and fluid intake.  Troponin was found to be elevated at 4.16.  To start heparin for r/o ACS.  Goal of Therapy:  Heparin level 0.3-0.7 units/ml Monitor platelets by anticoagulation protocol: Yes   Plan:  - Heparin 3500 units IV bolus x1, then drip at 700 units/hr - check 8 hr heparin level - monitor for s/s bleeding  Kristelle Cavallaro P 12/28/2016,5:17 PM

## 2016-12-28 NOTE — ED Notes (Signed)
She has been in, and continues to be in nsr. She has no c/o pain, and has been resting quite comfortably all afternoon.

## 2016-12-28 NOTE — ED Provider Notes (Signed)
[facility]-EMERGENCY DEPT Provider Note   CSN: [id number] Arrival date & time: 12/28/16  1443     History   Chief Complaint No chief complaint on file.   HPI Amy Hood is a 76 y.o. female.  HPI History attempted via vietnamese interpreter is as been documented in the past the patient. Unable to understand The interpreter and the interpreter states that she is unable to communicate with patient. History is subsequently provided by patient's family who takes care of her at home. She has history of diabetes, hypertension hyperlipidemia. States that over the past 2-3 days she has been staying in bed, too weak to ambulate or do daily activities. She has had decreased appetite and decreased by mouth intake. They states that she has been complaining of feeling chills and feeling cold. She has not had known fever. Denies any cough, abdominal pain, nausea or vomiting, or diarrhea. Family has noticed that her breathing more labored at home. Maybe has noted some mild dysuria. No known sick contacts.   Past Medical History:  Diagnosis Date  . Arthritis   . Cataracts, bilateral   . Diabetes mellitus without complication (HCC)   . Hyperlipidemia   . Hypertension     Patient Active Problem List   Diagnosis Date Noted  . NSTEMI (non-ST elevated myocardial infarction) (HCC) 12/28/2016  . CAP (community acquired pneumonia) 12/28/2016  . Fever 12/28/2016  . Leukocytosis 12/28/2016  . Hyponatremia 12/28/2016  . UTI (urinary tract infection) 12/28/2016  . Elevated troponin 12/28/2016  . CAD (coronary artery disease) 12/28/2016  . Abnormal ECG 12/28/2016  . Generalized weakness 12/28/2016  . Non-English speaking patient 12/28/2016  . Thrombocytopenia (HCC) 12/28/2016  . Atrial fibrillation with slow ventricular response (HCC) 12/28/2016  . Hypertension   . Arthritis   . Compression fracture of L5 lumbar vertebra (HCC) 09/20/2013  . Acute lumbar radiculopathy 09/20/2013  . Acute encephalopathy  09/19/2013  . Suicidal behavior 09/19/2013  . Hyperlipidemia   . Cataracts, bilateral     Past Surgical History:  Procedure Laterality Date  . KYPHOPLASTY      OB History    No data available       Home Medications    Prior to Admission medications   Medication Sig Start Date End Date Taking? Authorizing Provider  alendronate (FOSAMAX) 70 MG tablet Take 70 mg by mouth every 7 (seven) days. Take with a full glass of water on an empty stomach.    Historical Provider, MD  cholecalciferol (VITAMIN D) 1000 UNITS tablet Take 1,000 Units by mouth daily.    Historical Provider, MD  glimepiride (AMARYL) 4 MG tablet Take 4 mg by mouth daily before breakfast.    Historical Provider, MD  HYDROcodone-acetaminophen (NORCO/VICODIN) 5-325 MG per tablet Take 1 tablet by mouth every 6 (six) hours as needed. 06/19/15   Lenell AntuJamie Wright, MD  iron polysaccharides (NIFEREX) 150 MG capsule Take 1 capsule (150 mg total) by mouth daily. 09/21/13   Georgann HousekeeperKarrar Husain, MD  lovastatin (MEVACOR) 40 MG tablet Take 40 mg by mouth daily.    Historical Provider, MD  Ethelda Chickyster Shell (OYSTER CALCIUM) 500 MG TABS tablet Take 500 mg of elemental calcium by mouth 2 (two) times daily.    Historical Provider, MD  pioglitazone (ACTOS) 45 MG tablet Take 45 mg by mouth daily.    Historical Provider, MD  valsartan (DIOVAN) 80 MG tablet Take 80 mg by mouth daily.    Historical Provider, MD    Family History Family History  Problem  Relation Age of Onset  . Other Neg Hx     Social History Social History  Substance Use Topics  . Smoking status: Never Smoker  . Smokeless tobacco: Not on file  . Alcohol use No     Allergies   Patient has no known allergies.   Review of Systems Review of Systems 10/14 systems reviewed and are negative other than those stated in the HPI    Physical Exam Updated Vital Signs BP 132/65   Pulse 83   Temp 101 F (38.3 C) (Rectal)   Resp 24   Ht 5' (1.524 m)   Wt 145 lb (65.8 kg)   SpO2  91%   BMI 28.32 kg/m   Physical Exam Physical Exam  Nursing note and vitals reviewed. Constitutional: Well developed, well nourished, non-toxic, and in no acute distress Head: Normocephalic and atraumatic.  Mouth/Throat: Oropharynx is clear and dry.  Neck: Normal range of motion. Neck supple.  Cardiovascular: Normal rate and regular rhythm.  no edema Pulmonary/Chest: Effort normal and breath sounds normal.  Abdominal: Soft. There is no tenderness. There is no rebound and no guarding.  Musculoskeletal: No deformities  Neurological: Alert, no facial droop, moves all extremities symmetrically Skin: Skin is warm and dry.  Psychiatric: Cooperative   ED Treatments / Results  Labs (all labs ordered are listed, but only abnormal results are displayed) Labs Reviewed  COMPREHENSIVE METABOLIC PANEL - Abnormal; Notable for the following:       Result Value   Sodium 130 (*)    Chloride 99 (*)    Glucose, Bld 168 (*)    BUN 26 (*)    Calcium 7.9 (*)    AST 58 (*)    GFR calc non Af Amer 56 (*)    All other components within normal limits  CBC WITH DIFFERENTIAL/PLATELET - Abnormal; Notable for the following:    WBC 11.3 (*)    RBC 6.13 (*)    Hemoglobin 16.9 (*)    HCT 47.6 (*)    MCV 77.7 (*)    Platelets 128 (*)    Neutro Abs 10.4 (*)    Lymphs Abs 0.4 (*)    All other components within normal limits  URINALYSIS, ROUTINE W REFLEX MICROSCOPIC - Abnormal; Notable for the following:    Hgb urine dipstick MODERATE (*)    Ketones, ur 5 (*)    Protein, ur 30 (*)    Nitrite POSITIVE (*)    Leukocytes, UA LARGE (*)    Bacteria, UA RARE (*)    Squamous Epithelial / LPF 0-5 (*)    All other components within normal limits  CBG MONITORING, ED - Abnormal; Notable for the following:    Glucose-Capillary 164 (*)    All other components within normal limits  I-STAT TROPOININ, ED - Abnormal; Notable for the following:    Troponin i, poc 5.01 (*)    All other components within normal  limits  I-STAT TROPOININ, ED - Abnormal; Notable for the following:    Troponin i, poc 4.16 (*)    All other components within normal limits  CULTURE, BLOOD (ROUTINE X 2)  CULTURE, BLOOD (ROUTINE X 2)  URINE CULTURE  PROTIME-INR  TROPONIN I  INFLUENZA PANEL BY PCR (TYPE A & B)  LIPID PANEL  HEMOGLOBIN A1C  VITAMIN D 25 HYDROXY (VIT D DEFICIENCY, FRACTURES)  TSH  HEPARIN LEVEL (UNFRACTIONATED)  CBC  I-STAT CG4 LACTIC ACID, ED  I-STAT CG4 LACTIC ACID, ED    EKG  EKG Interpretation  Date/Time:  Saturday December 28 2016 16:05:21 EST Ventricular Rate:  78 PR Interval:    QRS Duration: 98 QT Interval:  382 QTC Calculation: 436 R Axis:   55 Text Interpretation:  Atrial fibrillation Low voltage, extremity and precordial leads Nonspecific T abnormalities, anterior leads Baseline wander in lead(s) V6 no prior EKG  Confirmed by Srihaan Mastrangelo MD, Zafirah Vanzee [phone]) on 12/28/2016 4:51:26 PM       Radiology Dg Chest 2 View  Result Date: 12/28/2016 CLINICAL DATA:  Fever and cough today.  Lethargy. EXAM: CHEST  2 VIEW COMPARISON:  09/18/2013 FINDINGS: Mild cardiomegaly. Aortic atherosclerosis. Patchy density both lung bases consistent with mild bronchopneumonia. No dense consolidation or collapse. Old vertebral augmentation. No acute bone finding. IMPRESSION: Cardiomegaly.  Aortic atherosclerosis. Patchy density both lung bases consistent with mild pneumonia. No dense consolidation or lobar collapse. Electronically Signed   By: Paulina Fusi M.D.   On: 12/28/2016 15:32    Procedures Procedures (including critical care time) CRITICAL CARE Performed by: Lavera Guise   Total critical care time: 40 minutes  Critical care time was exclusive of separately billable procedures and treating other patients.  Critical care was necessary to treat or prevent imminent or life-threatening deterioration.  Critical care was time spent personally by me on the following activities: development of treatment plan with  patient and/or surrogate as well as nursing, discussions with consultants, evaluation of patient's response to treatment, examination of patient, obtaining history from patient or surrogate, ordering and performing treatments and interventions, ordering and review of laboratory studies, ordering and review of radiographic studies, pulse oximetry and re-evaluation of patient's condition.  Medications Ordered in ED Medications  azithromycin (ZITHROMAX) 500 mg in dextrose 5 % 250 mL IVPB (500 mg Intravenous New Bag/Given 12/28/16 1712)  heparin bolus via infusion 3,500 Units (not administered)  heparin ADULT infusion 100 units/mL (25000 units/237mL sodium chloride 0.45%) (not administered)  sodium chloride 0.9 % bolus 1,000 mL (0 mLs Intravenous Stopped 12/28/16 1620)  acetaminophen (TYLENOL) tablet 1,000 mg (1,000 mg Oral Given 12/28/16 1623)  cefTRIAXone (ROCEPHIN) 2 g in dextrose 5 % 50 mL IVPB (0 g Intravenous Stopped 12/28/16 1710)  aspirin chewable tablet 324 mg (324 mg Oral Given 12/28/16 1711)     Initial Impression / Assessment and Plan / ED Course  I have reviewed the triage vital signs and the nursing notes.  Pertinent labs & imaging results that were available during my care of the patient were reviewed by me and considered in my medical decision making (see chart for details).     Presenting with generalized weakness for the past 3 days. Does have fever of 101 Fahrenheit. No tachycardia, hypotension, or respiratory distress. Infectious workup does suggest lobar pneumonia as well as UTI as source of fever. Covered with ceftriaxone and azithromycin. Normal lactic acid, no significant leukocytosis.  She does incidentally have a troponin elevation of 4.16. No prior EKGs in our system, but EKG here today shows anterior T-wave changes. Presentation concerning for an STEMI. She denies having any chest pain during any time, the family has again noted some labored breathing intermittently. I  discussed with Dr. Anne Fu from cardiology. Given significant level of troponin elevation does think that she may require intervention once her infections have been treated. An full dose aspirin and started on a heparin drip in the interim.  Discussed with Dr. Laural Benes from hospitalist service who will admit for ongoing management.  Final Clinical Impressions(s) / ED Diagnoses  Final diagnoses:  Essential hypertension  Arthritis  Acute cystitis without hematuria  NSTEMI (non-ST elevated myocardial infarction) (HCC)  Lobar pneumonia (HCC)    New Prescriptions New Prescriptions   No medications on file     Lavera Guise, MD 12/28/16 1742

## 2016-12-28 NOTE — Progress Notes (Signed)
Pt BP 96/50, received orders for a 500cc fluid bolus. BP increased to 103/60. Will continue to monitor.

## 2016-12-28 NOTE — ED Triage Notes (Signed)
Her family phoned EMS--they report that pt. Has been listless for about three days now; also she has been diaphoretic at times and her appetite and fluid intake has been decreased.  She arrives in no distress; and she is a bit diaphoretic.  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

## 2016-12-28 NOTE — H&P (Signed)
History and Physical  Coletta MemosGiau T Soley BMW:[id number] DOB: 10-Jul-1941 DOA: 12/28/2016  Referring physician: Verdie MosherLiu PCP: Georgann HousekeeperHUSAIN,KARRAR, MD   Chief Complaint: difficulty breathing  Historians: Family (Pt is non-English speaking and interpreter unable to communicate with patient)   HPI: Coletta MemosGiau T Hunsucker is a 76 y.o. vietnamese speaking female with complex PMH including long-standing diabetes mellitus with microvascular complications, dyslipidemia, and hypertension who was brought to ED by family who was concerned about an acute decline in patient's condition. Family reports that over past 2-3 days she has been staying in bed, too weak to ambulate or do daily activities. She has had decreased appetite and decreased by mouth intake. They states that she has been complaining of feeling chills and feeling cold.  She has not complained of chest pain but has had some difficulty and labored breathing in past few days per family.  She has not had known fever. Denies any cough, abdominal pain, nausea or vomiting, or diarrhea. Family has noticed that her breathing more labored at home. Maybe has noted some mild dysuria. No known sick contacts.   ED Course: Pt was found to be febrile with pneumonia seen on chest xray and infected urine was seen.  In addition, patient noted to have elevated troponin I.  She was started on IV heparin infusion and IV antibiotics and admission was requested.   Review of Systems: Unable to obtain.  Past Medical History:  Diagnosis Date  . Arthritis   . Cataracts, bilateral   . Diabetes mellitus without complication (HCC)   . Hyperlipidemia   . Hypertension    Past Surgical History:  Procedure Laterality Date  . KYPHOPLASTY     Social History:  reports that she has never smoked. She does not have any smokeless tobacco history on file. She reports that she does not drink alcohol or use drugs.   No Known Allergies  Family History  Problem Relation Age of Onset  . Other Neg  Hx       Prior to Admission medications   Medication Sig Start Date End Date Taking? Authorizing Provider  alendronate (FOSAMAX) 70 MG tablet Take 70 mg by mouth every 7 (seven) days. Take with a full glass of water on an empty stomach.    Historical Provider, MD  cholecalciferol (VITAMIN D) 1000 UNITS tablet Take 1,000 Units by mouth daily.    Historical Provider, MD  glimepiride (AMARYL) 4 MG tablet Take 4 mg by mouth daily before breakfast.    Historical Provider, MD  HYDROcodone-acetaminophen (NORCO/VICODIN) 5-325 MG per tablet Take 1 tablet by mouth every 6 (six) hours as needed. 06/19/15   Lenell AntuJamie Wright, MD  iron polysaccharides (NIFEREX) 150 MG capsule Take 1 capsule (150 mg total) by mouth daily. 09/21/13   Georgann HousekeeperKarrar Husain, MD  lovastatin (MEVACOR) 40 MG tablet Take 40 mg by mouth daily.    Historical Provider, MD  Ethelda Chickyster Shell (OYSTER CALCIUM) 500 MG TABS tablet Take 500 mg of elemental calcium by mouth 2 (two) times daily.    Historical Provider, MD  pioglitazone (ACTOS) 45 MG tablet Take 45 mg by mouth daily.    Historical Provider, MD  valsartan (DIOVAN) 80 MG tablet Take 80 mg by mouth daily.    Historical Provider, MD   Physical Exam: Vitals:   12/28/16 1630 12/28/16 1645 12/28/16 1700 12/28/16 1714  BP: 129/64  132/65   Pulse:      Resp: 24 (!) 27 24   Temp:      TempSrc:  SpO2:      Weight:    65.8 kg (145 lb)  Height:    5' (1.524 m)     General exam: Pt is Hard of hearing.  Moderately built and nourished patient, lying comfortably supine on the gurney in no obvious distress.  Head, eyes and ENT: Nontraumatic and normocephalic. Pupils equally reacting to light and accommodation. Oral mucosa dry.  Neck: Supple. No JVD, carotid bruit or thyromegaly.  Lymphatics: No lymphadenopathy.  Respiratory system: diminished BS RLL.  Cardiovascular system: S1 and S2 heard.  No JVD, murmurs, gallops, clicks or pedal edema.  Gastrointestinal system: Abdomen is nondistended,  soft and nontender. Normal bowel sounds heard. No organomegaly or masses appreciated.  Central nervous system: Alert and oriented. No focal neurological deficits.  Extremities: Symmetric 5 x 5 power. Peripheral pulses symmetrically felt.   Skin: No rashes or acute findings.  Musculoskeletal system: Negative exam.  Psychiatry: Pleasant and cooperative.  Labs on Admission:  Basic Metabolic Panel:  Recent Labs Lab 12/28/16 1510  NA 130*  K 4.1  CL 99*  CO2 22  GLUCOSE 168*  BUN 26*  CREATININE 0.95  CALCIUM 7.9*   Liver Function Tests:  Recent Labs Lab 12/28/16 1510  AST 58*  ALT 35  ALKPHOS 57  BILITOT 0.5  PROT 7.1  ALBUMIN 3.6   No results for input(s): LIPASE, AMYLASE in the last 168 hours. No results for input(s): AMMONIA in the last 168 hours. CBC:  Recent Labs Lab 12/28/16 1510  WBC 11.3*  NEUTROABS 10.4*  HGB 16.9*  HCT 47.6*  MCV 77.7*  PLT 128*   Cardiac Enzymes: No results for input(s): CKTOTAL, CKMB, CKMBINDEX, TROPONINI in the last 168 hours.  BNP (last 3 results) No results for input(s): PROBNP in the last 8760 hours. CBG:  Recent Labs Lab 12/28/16 1551  GLUCAP 164*    Radiological Exams on Admission: Dg Chest 2 View  Result Date: 12/28/2016 CLINICAL DATA:  Fever and cough today.  Lethargy. EXAM: CHEST  2 VIEW COMPARISON:  09/18/2013 FINDINGS: Mild cardiomegaly. Aortic atherosclerosis. Patchy density both lung bases consistent with mild bronchopneumonia. No dense consolidation or collapse. Old vertebral augmentation. No acute bone finding. IMPRESSION: Cardiomegaly.  Aortic atherosclerosis. Patchy density both lung bases consistent with mild pneumonia. No dense consolidation or lobar collapse. Electronically Signed   By: Paulina Fusi M.D.   On: 12/28/2016 15:32    EKG: Independently reviewed. Atrial fibrillation with slow ventricular response   Assessment/Plan Principal Problem:   NSTEMI (non-ST elevated myocardial infarction)  (HCC) Active Problems:   CAP (community acquired pneumonia)   Non-English speaking patient   Atrial fibrillation with slow ventricular response (HCC)   Hyperlipidemia   Cataracts, bilateral   Hypertension   Arthritis   Fever   Leukocytosis   Hyponatremia   UTI (urinary tract infection)   Elevated troponin   CAD (coronary artery disease)   Abnormal ECG   Generalized weakness   Thrombocytopenia (HCC)   1. NSTEMI - Pt likely has had an MI in the last 1-2 days. Admit to SDU for IV heparin infusion. Cardiology has been consulted. Check lipids. Follow telemetry. ASA, Morphine, NTG, Oxygen ordered.   2. Atrial Fibrillation with slow ventricular response - seen on EKG, monitor on telemetry, IV heparin infusion.  Further mgmt per cardiology service. 3. Diabetes mellitus type 2, insulin requiring with complications- Follow and adjust, home meds not known at this time, treating with subcut insulin sliding scale in hospital. Checking A1c.  4.  Hyponatremia - secondary to hyperglycemia and dehydration, treating with IV normal saline, repeat labs in AM.  5. UTI - Treating with IV ceftriaxone, follow urine culture and sensitivities.  6. CAP - Treating with IV ceftriaxone and azithromycin, follow cultures, oxygen support. Check swallow evaluation. Repeat CXR in AM.  7. Essential Hypertension - following.  Adjust therapies as needed.  8. Thrombocytopenia - will need to follow closely while on heparin infusion.  9. Generalized weakness - multifactorial causes noted above, PT eval ordered.  Fall precautions.  10. Hard of Hearing and Non-English speaking - Communication will be difficult with patient but will make every effort.  Likely will have to try written communication if the patient can read and write which family not able to tell me at this time.  Tried to use interpreter through phone but patient unable to hear and interpreter unable to get through to patient.      DVT Prophylaxis: IV heparin  infusion Code Status: full  Family Communication: bedside  Disposition Plan: TBD   Time spent: 65 mins  Standley Dakins, MD [facility] Pager [phone]  If 7PM-7AM, please contact night-coverage [email or web address] Password [facility] 12/28/2016, 5:41 PM

## 2016-12-29 ENCOUNTER — Encounter (HOSPITAL_COMMUNITY): Payer: Self-pay | Admitting: Cardiology

## 2016-12-29 ENCOUNTER — Inpatient Hospital Stay (HOSPITAL_COMMUNITY): Payer: Medicare Other

## 2016-12-29 DIAGNOSIS — E782 Mixed hyperlipidemia: Secondary | ICD-10-CM

## 2016-12-29 DIAGNOSIS — E785 Hyperlipidemia, unspecified: Secondary | ICD-10-CM

## 2016-12-29 DIAGNOSIS — E118 Type 2 diabetes mellitus with unspecified complications: Secondary | ICD-10-CM

## 2016-12-29 DIAGNOSIS — I10 Essential (primary) hypertension: Secondary | ICD-10-CM

## 2016-12-29 DIAGNOSIS — J189 Pneumonia, unspecified organism: Secondary | ICD-10-CM

## 2016-12-29 DIAGNOSIS — N3000 Acute cystitis without hematuria: Secondary | ICD-10-CM

## 2016-12-29 DIAGNOSIS — I214 Non-ST elevation (NSTEMI) myocardial infarction: Principal | ICD-10-CM

## 2016-12-29 DIAGNOSIS — R7989 Other specified abnormal findings of blood chemistry: Secondary | ICD-10-CM

## 2016-12-29 LAB — COMPREHENSIVE METABOLIC PANEL WITH GFR
ALT: 34 U/L (ref 14–54)
AST: 54 U/L — ABNORMAL HIGH (ref 15–41)
Albumin: 3.1 g/dL — ABNORMAL LOW (ref 3.5–5.0)
Alkaline Phosphatase: 46 U/L (ref 38–126)
Anion gap: 6 (ref 5–15)
BUN: 22 mg/dL — ABNORMAL HIGH (ref 6–20)
CO2: 24 mmol/L (ref 22–32)
Calcium: 7.7 mg/dL — ABNORMAL LOW (ref 8.9–10.3)
Chloride: 106 mmol/L (ref 101–111)
Creatinine, Ser: 0.73 mg/dL (ref 0.44–1.00)
GFR calc Af Amer: >60 mL/min
GFR calc non Af Amer: >60 mL/min
Glucose, Bld: 86 mg/dL (ref 65–99)
Potassium: 4.4 mmol/L (ref 3.5–5.1)
Sodium: 136 mmol/L (ref 135–145)
Total Bilirubin: 0.5 mg/dL (ref 0.3–1.2)
Total Protein: 6.9 g/dL (ref 6.5–8.1)

## 2016-12-29 LAB — CBC
HEMATOCRIT: 27.3 % — AB (ref 36.0–46.0)
Hemoglobin: 9.3 g/dL — ABNORMAL LOW (ref 12.0–15.0)
MCH: 26.6 pg (ref 26.0–34.0)
MCHC: 34.1 g/dL (ref 30.0–36.0)
MCV: 78 fL (ref 78.0–100.0)
PLATELETS: 208 10*3/uL (ref 150–400)
RBC: 3.5 MIL/uL — ABNORMAL LOW (ref 3.87–5.11)
RDW: 13.5 % (ref 11.5–15.5)
WBC: 12.5 10*3/uL — ABNORMAL HIGH (ref 4.0–10.5)

## 2016-12-29 LAB — INFLUENZA PANEL BY PCR (TYPE A & B)
INFLAPCR: NEGATIVE
Influenza B By PCR: NEGATIVE

## 2016-12-29 LAB — GLUCOSE, CAPILLARY
GLUCOSE-CAPILLARY: 222 mg/dL — AB (ref 65–99)
GLUCOSE-CAPILLARY: 82 mg/dL (ref 65–99)
Glucose-Capillary: 221 mg/dL — ABNORMAL HIGH (ref 65–99)
Glucose-Capillary: 187 mg/dL — ABNORMAL HIGH (ref 65–99)

## 2016-12-29 LAB — HEPARIN LEVEL (UNFRACTIONATED)
HEPARIN UNFRACTIONATED: 0.2 [IU]/mL — AB (ref 0.30–0.70)
HEPARIN UNFRACTIONATED: 0.18 [IU]/mL — AB (ref 0.30–0.70)
Heparin Unfractionated: 0.25 IU/mL — ABNORMAL LOW (ref 0.30–0.70)

## 2016-12-29 LAB — ECHOCARDIOGRAM COMPLETE
Height: 60 in
WEIGHTICAEL: 2924.18 [oz_av]

## 2016-12-29 LAB — TROPONIN I
Troponin I: 2.61 ng/mL (ref <0.03)
Troponin I: 1.9 ng/mL

## 2016-12-29 LAB — STREP PNEUMONIAE URINARY ANTIGEN: Strep Pneumo Urinary Antigen: NEGATIVE

## 2016-12-29 MED ORDER — HEPARIN (PORCINE) IN NACL 100-0.45 UNIT/ML-% IJ SOLN
1000.0000 [IU]/h | INTRAMUSCULAR | Status: DC
  Administered 2016-12-29: 900 [IU]/h via INTRAVENOUS
  Administered 2016-12-31: 1000 [IU]/h via INTRAVENOUS
  Filled 2016-12-29 (×5): qty 250

## 2016-12-29 MED ORDER — PRAVASTATIN SODIUM 40 MG PO TABS
40.0000 mg | ORAL_TABLET | Freq: Every day | ORAL | Status: DC
  Administered 2016-12-29 – 2016-12-30 (×2): 40 mg via ORAL
  Filled 2016-12-29 (×2): qty 2

## 2016-12-29 MED ORDER — ALBUTEROL SULFATE (2.5 MG/3ML) 0.083% IN NEBU
2.5000 mg | INHALATION_SOLUTION | Freq: Once | RESPIRATORY_TRACT | Status: AC
  Administered 2016-12-29: 2.5 mg via RESPIRATORY_TRACT
  Filled 2016-12-29: qty 3

## 2016-12-29 MED ORDER — FUROSEMIDE 10 MG/ML IJ SOLN
40.0000 mg | Freq: Once | INTRAMUSCULAR | Status: AC
  Administered 2016-12-29: 40 mg via INTRAVENOUS
  Filled 2016-12-29: qty 4

## 2016-12-29 MED ORDER — ALBUTEROL SULFATE (2.5 MG/3ML) 0.083% IN NEBU: 2.5000 mg | INHALATION_SOLUTION | RESPIRATORY_TRACT | Status: DC | PRN

## 2016-12-29 NOTE — Progress Notes (Signed)
ANTICOAGULATION CONSULT NOTE   Pharmacy Consult for heparin Indication: chest pain/ACS  No Known Allergies  Patient Measurements: Height: 5' (152.4 cm) Weight: 182 lb 12.2 oz (82.9 kg) IBW/kg (Calculated) : 45.5 Heparin Dosing Weight: 60 kg  Vital Signs: Temp: 99.2 F (37.3 C) (01/21 0800) Temp Source: Oral (01/21 0800) BP: 160/53 (01/21 1200) Pulse Rate: 85 (01/21 1200)  Labs:  Recent Labs  12/28/16 1510  12/28/16 1903 12/29/16 0052 12/29/16 0142 12/29/16 0713 12/29/16 1140  HGB 16.9*  --   --   --  9.3*  --   --   HCT 47.6*  --   --   --  27.3*  --   --   PLT 128*  --   --   --  208  --   --   LABPROT 14.4  --   --   --   --   --   --   INR 1.12  --   --   --   --   --   --   HEPARINUNFRC  --   --   --   --  0.20*  --  0.18*  CREATININE 0.95  --   --   --  0.73  --   --   TROPONINI  --   < > 4.72* 2.61*  --  1.90*  --   < > = values in this interval not displayed.  Estimated Creatinine Clearance: 56.2 mL/min (by C-G formula based on SCr of 0.73 mg/dL).   Medical History: Past Medical History:  Diagnosis Date  . Arthritis   . Cataracts, bilateral   . Essential hypertension   . Hyperlipidemia   . Type 2 diabetes mellitus [facility](HCC)     Assessment: Patient is a  76 y.o F presented to the ED on 12/28/16 with c/o decreased appetite and fluid intake.  Troponin was found to be elevated at 4.16.  To start heparin for r/o ACS.  Today. 12/29/2016  Repeat heparin level subtherapeutic, level decreased despite increasing rate to 800 units/hr this morning  Discussed with RN, IV site issues with alarm beeping frequently this morning so heparin not infusing continuously with many interruptions.  RN to look for new site if remains issue.   CBC: Hgb elevated at admission yday now decreased, pltc low at admission now WNL  No reported bleeding issues  Goal of Therapy:  Heparin level 0.3-0.7 units/ml Monitor platelets by anticoagulation protocol: Yes   Plan:   Increase  heparin rate to 900 units/hr, less aggressive increase as infusion issues playing role in lower than expected heparin level  Heparin level in 8h  Daily heparin level and CBC  Juliette Alcideustin Zeigler, PharmD, BCPS.   Pager: [phone] 12/29/2016 1:17 PM

## 2016-12-29 NOTE — Progress Notes (Signed)
ANTICOAGULATION CONSULT NOTE - Follow Up Consult  Pharmacy Consult for Heparin Indication: chest pain/ACS  No Known Allergies  Patient Measurements: Height: 5' (152.4 cm) Weight: 182 lb 12.2 oz (82.9 kg) IBW/kg (Calculated) : 45.5 Heparin Dosing Weight:   Vital Signs: Temp: 99.1 F (37.3 C) (01/21 0015) Temp Source: Axillary (01/21 0015) BP: 120/70 (01/21 0200) Pulse Rate: 86 (01/21 0200)  Labs:  Recent Labs  12/28/16 1510 12/28/16 1709 12/28/16 1903 12/29/16 0052 12/29/16 0142  HGB 16.9*  --   --   --  9.3*  HCT 47.6*  --   --   --  27.3*  PLT 128*  --   --   --  208  LABPROT 14.4  --   --   --   --   INR 1.12  --   --   --   --   HEPARINUNFRC  --   --   --   --  0.20*  CREATININE 0.95  --   --   --  0.73  TROPONINI  --  4.87* 4.72* 2.61*  --     Estimated Creatinine Clearance: 56.2 mL/min (by C-G formula based on SCr of 0.73 mg/dL).   Medications:  Infusions:  . sodium chloride 60 mL/hr at 12/29/16 0200  . heparin 700 Units/hr (12/29/16 0200)    Assessment: Patient with heparin level low.  No heparin issues per RN.  Goal of Therapy:  Heparin level 0.3-0.7 units/ml Monitor platelets by anticoagulation protocol: Yes   Plan:  Increase heparin to 800 units/hr Recheck level at [address]1200  Branon Sabine Jr, [place] 12/29/2016,3:26 AM

## 2016-12-29 NOTE — Progress Notes (Signed)
ANTICOAGULATION CONSULT NOTE - Follow Up Consult  Pharmacy Consult for Heparin Indication: chest pain/ACS  No Known Allergies  Patient Measurements: Height: 5' (152.4 cm) Weight: 182 lb 12.2 oz (82.9 kg) IBW/kg (Calculated) : 45.5 Heparin Dosing Weight:   Vital Signs: Temp: 99.6 F (37.6 C) (01/21 2309) Temp Source: Oral (01/21 2309) BP: 141/43 (01/21 2000) Pulse Rate: 89 (01/21 2000)  Labs:  Recent Labs  12/28/16 1510  12/28/16 1903 12/29/16 0052 12/29/16 0142 12/29/16 0713 12/29/16 1140 12/29/16 2206  HGB 16.9*  --   --   --  9.3*  --   --   --   HCT 47.6*  --   --   --  27.3*  --   --   --   PLT 128*  --   --   --  208  --   --   --   LABPROT 14.4  --   --   --   --   --   --   --   INR 1.12  --   --   --   --   --   --   --   HEPARINUNFRC  --   --   --   --  0.20*  --  0.18* 0.25*  CREATININE 0.95  --   --   --  0.73  --   --   --   TROPONINI  --   < > 4.72* 2.61*  --  1.90*  --   --   < > = values in this interval not displayed.  Estimated Creatinine Clearance: 56.2 mL/min (by C-G formula based on SCr of 0.73 mg/dL).   Medications:  Infusions:  . heparin 900 Units/hr (12/29/16 2000)    Assessment: Patient with low heparin level.  No heparin issues per RN.  Goal of Therapy:  Heparin level 0.3-0.7 units/ml Monitor platelets by anticoagulation protocol: Yes   Plan:  Increase heparin to 1000 units/hr Recheck heparin level at 0800  Aleene DavidsonGrimsley Jr, Jahnyla Parrillo Crowford 12/29/2016,11:24 PM

## 2016-12-29 NOTE — Progress Notes (Signed)
PROGRESS NOTE    Amy MemosGiau T Diguglielmo  XBJ:[id number]:[id number]  DOB: 1941-08-31  DOA: 12/28/2016 PCP: Georgann HousekeeperHUSAIN,KARRAR, MD Outpatient Specialists:   Hospital course: Amy Hood is a 76 y.o. vietnamese speaking female with complex PMH including long-standing diabetes mellitus with microvascular complications, dyslipidemia, and hypertension who was brought to ED by family who was concerned about an acute decline in patient's condition. Family reports that over past 2-3 days she has been staying in bed, too weak to ambulate or do daily activities. She has had decreased appetite and decreased by mouth intake. They states that she has been complaining of feeling chills and feeling cold. Pt was found to be febrile with pneumonia seen on chest xray and infected urine was seen.  In addition, patient noted to have elevated troponin I.  She was started on IV heparin infusion and IV antibiotics and admission was requested.   Assessment & Plan:   1. NSTEMI - Pt likely has had an MI in the last 1-2 days. Admit to SDU for IV heparin infusion. Cardiology has been consulted. Check lipids. Follow telemetry. ASA, Morphine, NTG, Oxygen ordered.   2. Atrial Fibrillation with slow ventricular response - seen on EKG, repeat EKG this morning. monitor on telemetry, IV heparin infusion.  Further mgmt per cardiology service. 3. Diabetes mellitus type 2, insulin requiring with complications- Follow and adjust, home meds not known at this time, treating with subcut insulin sliding scale in hospital. Checking A1c.  4. Hyponatremia - secondary to hyperglycemia and dehydration, treating with IV normal saline, repeat labs in AM.  5. UTI - Treating with IV ceftriaxone, follow urine culture and sensitivities.  6. CAP - Treating with IV ceftriaxone and azithromycin, follow cultures, oxygen support. Check swallow evaluation. Repeat CXR in AM.  7. Essential Hypertension - following.  Adjust therapies as needed.  8. Thrombocytopenia - will need  to follow closely while on heparin infusion.  9. Generalized weakness - multifactorial causes noted above, PT eval ordered.  Fall precautions.  10. Hard of Hearing and Non-English speaking - Communication will be difficult with patient but will make every effort.  Likely will have to try written communication if the patient can read and write which family not able to tell me at this time.  Tried to use interpreter through phone but patient unable to hear and interpreter unable to get through to patient.      DVT Prophylaxis: IV heparin infusion Code Status: full  Family Communication: bedside  Disposition Plan: TBD   Subjective: Pt appears more comfortable today.  No complaints of chest pain or SOB.   Objective: Vitals:   12/29/16 0300 12/29/16 0400 12/29/16 0502 12/29/16 0600  BP: (!) 144/61 (!) 137/39 (!) 110/28 (!) 137/54  Pulse: 90 90 86 83  Resp: 19 (!) 32 18 (!) 23  Temp:  98.8 F (37.1 C)    TempSrc:  Oral    SpO2: 95% 92% 94% 96%  Weight:      Height:        Intake/Output Summary (Last 24 hours) at 12/29/16 0748 Last data filed at 12/29/16 0600  Gross per 24 hour  Intake           2256.1 ml  Output                0 ml  Net           2256.1 ml   Filed Weights   12/28/16 1714 12/28/16 2000  Weight: 65.8 kg (145 lb)  82.9 kg (182 lb 12.2 oz)    Exam:  General exam: Pt is Hard of hearing.  Moderately built and nourished patient, lying comfortably supine on the gurney in no obvious distress. Respiratory system: Clear. No increased work of breathing. Cardiovascular system: S1 & S2 heard, RRR.  Gastrointestinal system: Abdomen is nondistended, soft and nontender. Normal bowel sounds heard. Central nervous system: Alert and oriented. No focal neurological deficits. Extremities: no cyanosis.  Data Reviewed: Basic Metabolic Panel:  Recent Labs Lab 12/28/16 1510 12/29/16 0142  NA 130* 136  K 4.1 4.4  CL 99* 106  CO2 22 24  GLUCOSE 168* 86  BUN 26* 22*    CREATININE 0.95 0.73  CALCIUM 7.9* 7.7*   Liver Function Tests:  Recent Labs Lab 12/28/16 1510 12/29/16 0142  AST 58* 54*  ALT 35 34  ALKPHOS 57 46  BILITOT 0.5 0.5  PROT 7.1 6.9  ALBUMIN 3.6 3.1*   No results for input(s): LIPASE, AMYLASE in the last 168 hours. No results for input(s): AMMONIA in the last 168 hours. CBC:  Recent Labs Lab 12/28/16 1510 12/29/16 0142  WBC 11.3* 12.5*  NEUTROABS 10.4*  --   HGB 16.9* 9.3*  HCT 47.6* 27.3*  MCV 77.7* 78.0  PLT 128* 208   Cardiac Enzymes:  Recent Labs Lab 12/28/16 1709 12/28/16 1903 12/29/16 0052  TROPONINI 4.87* 4.72* 2.61*   CBG (last 3)   Recent Labs  12/28/16 1551 12/28/16 2140  GLUCAP 164* 205*   Recent Results (from the past 240 hour(s))  Culture, blood (Routine x 2)     Status: None (Preliminary result)   Collection Time: 12/28/16  3:10 PM  Result Value Ref Range Status   Specimen Description BLOOD LEFT ARM  Final   Special Requests BOTTLES DRAWN AEROBIC AND ANAEROBIC  Final   Culture PENDING  Incomplete   Report Status PENDING  Incomplete  Culture, blood (Routine x 2)     Status: None (Preliminary result)   Collection Time: 12/28/16  4:15 PM  Result Value Ref Range Status   Specimen Description BLOOD BLOOD RIGHT FOREARM  Final   Special Requests IN PEDIATRIC BOTTLE 1ml  Final   Culture PENDING  Incomplete   Report Status PENDING  Incomplete  MRSA PCR Screening     Status: None   Collection Time: 12/28/16  6:48 PM  Result Value Ref Range Status   MRSA by PCR NEGATIVE NEGATIVE Final    Comment:        The GeneXpert MRSA Assay (FDA approved for NASAL specimens only), is one component of a comprehensive MRSA colonization surveillance program. It is not intended to diagnose MRSA infection nor to guide or monitor treatment for MRSA infections.      Studies: Dg Chest 2 View  Result Date: 12/28/2016 CLINICAL DATA:  Fever and cough today.  Lethargy. EXAM: CHEST  2 VIEW COMPARISON:   09/18/2013 FINDINGS: Mild cardiomegaly. Aortic atherosclerosis. Patchy density both lung bases consistent with mild bronchopneumonia. No dense consolidation or collapse. Old vertebral augmentation. No acute bone finding. IMPRESSION: Cardiomegaly.  Aortic atherosclerosis. Patchy density both lung bases consistent with mild pneumonia. No dense consolidation or lobar collapse. Electronically Signed   By: Paulina Fusi M.D.   On: 12/28/2016 15:32   Dg Chest Port 1 View  Result Date: 12/29/2016 CLINICAL DATA:  Pneumonia. EXAM: PORTABLE CHEST 1 VIEW COMPARISON:  Frontal and lateral views yesterday. FINDINGS: Lower lung volumes from prior exam with bronchovascular crowding. Cardiomegaly with tortuous atherosclerotic  thoracic aorta. Increasing bibasilar and left perihilar streaky opacities. Limited assessment of pulmonary vasculature given technique, question mild pulmonary edema. No pleural fluid. No pneumothorax. Remote right rib fracture. IMPRESSION: Lower lung volumes from prior exam with bronchovascular crowding. Question mild pulmonary edema versus technique. Increasing bibasilar and left perihilar streaky opacities, atelectasis versus pneumonia. Electronically Signed   By: Rubye Oaks M.D.   On: 12/29/2016 05:38     Scheduled Meds: . aspirin EC  81 mg Oral Daily  . azithromycin  500 mg Intravenous Q24H  . cefTRIAXone (ROCEPHIN)  IV  1 g Intravenous Q24H  . insulin aspart  0-15 Units Subcutaneous TID WC  . insulin aspart  3 Units Subcutaneous TID WC  . pravastatin  40 mg Oral q1800   Continuous Infusions: . sodium chloride 60 mL/hr at 12/29/16 0600  . heparin 800 Units/hr (12/29/16 0600)    Principal Problem:   NSTEMI (non-ST elevated myocardial infarction) (HCC) Active Problems:   CAP (community acquired pneumonia)   Non-English speaking patient   Atrial fibrillation with slow ventricular response (HCC)   Hyperlipidemia   Cataracts, bilateral   Hypertension   Arthritis   Fever    Leukocytosis   Hyponatremia   UTI (urinary tract infection)   Elevated troponin   CAD (coronary artery disease)   Abnormal ECG   Generalized weakness   Thrombocytopenia (HCC)   Hard of hearing  Critical Care Time spent: 38 mins  Standley Dakins, MD, FAAFP [facility] Pager [phone]  If 7PM-7AM, please contact night-coverage [email or web address] Password [email or web address] 12/29/2016, 7:48 AM    LOS: 1 day

## 2016-12-29 NOTE — Consult Note (Signed)
Requesting provider: Dr. Standley Dakins Consulting cardiologist: Dr. Jonelle Sidle  Reason for consultation: NSTEMI  Clinical Summary Amy Hood is a 76 y.o.female [place] patient currently admitted to the hospital with community-acquired pneumonia and UTI. She was brought to the ER by family reporting generalized malaise and weakness over the last few days, shortness of breath although no coughing, limited appetite. She has had no chest pain or palpitations. As part of her workup cardiac markers were obtained with initial point-of-care troponin I found to be 5.01. ECG shows nonspecific changes overall as described below. Troponin I levels have trended down most recently to 2.61. We were consulted to assist with her management.  Cardiac risk factors include diabetes mellitus, hypertension, and hyperlipidemia. At home she has been on medical therapy including aspirin, Diovan, Mevacor, and hypoglycemics.  She has been placed on antibiotics by the primary team, also heparin infusion and aspirin. Overnight blood pressures were somewhat low while she was sleeping, she did get a fluid bolus. She is hemodynamically stable this morning and chest pain-free.  Influenza screen was negative.  No Known Allergies  Medications Scheduled Medications: . aspirin EC  81 mg Oral Daily  . azithromycin  500 mg Intravenous Q24H  . cefTRIAXone (ROCEPHIN)  IV  1 g Intravenous Q24H  . insulin aspart  0-15 Units Subcutaneous TID WC  . insulin aspart  3 Units Subcutaneous TID WC    Infusions: . sodium chloride 60 mL/hr at 12/29/16 0600  . heparin 800 Units/hr (12/29/16 0600)    PRN Medications: morphine injection, nitroGLYCERIN   Past Medical History:  Diagnosis Date  . Arthritis   . Cataracts, bilateral   . Essential hypertension   . Hyperlipidemia   . Type 2 diabetes mellitus (HCC)     Past Surgical History:  Procedure Laterality Date  . KYPHOPLASTY      Family History  Problem  Relation Age of Onset  . Diabetes Mother     Social History Amy Hood reports that she has never smoked. She has never used smokeless tobacco. Amy Hood reports that she does not drink alcohol.  Review of Systems Complete review of systems negative except as otherwise outlined in the clinical summary and also the following. No exertional chest pain.  Physical Examination Blood pressure (!) 137/54, pulse 83, temperature 98.8 F (37.1 C), temperature source Oral, resp. rate (!) 23, height 5' (1.524 m), weight 182 lb 12.2 oz (82.9 kg), SpO2 96 %.  Intake/Output Summary (Last 24 hours) at 12/29/16 0728 Last data filed at 12/29/16 0600  Gross per 24 hour  Intake           2256.1 ml  Output                0 ml  Net           2256.1 ml   Telemetry: Sinus rhythm. Lead artifact noted.  Gen.: Patient in no distress. HEENT: Conjunctiva and lids normal, oropharynx clear. Neck: Supple, no elevated JVP or carotid bruits, no thyromegaly. Lungs: Scattered rhonchi, nonlabored breathing at rest. Cardiac: Regular rate and rhythm, no S3, soft systolic murmur, no pericardial rub. Abdomen: Soft, nontender, bowel sounds present, no guarding or rebound. Extremities: No pitting edema, distal pulses 2+. Skin: Warm and dry. Musculoskeletal: No kyphosis. Neuropsychiatric: Alert and oriented x3, affect grossly appropriate.  Lab Results  Basic Metabolic Panel:  Recent Labs Lab 12/28/16 1510 12/29/16 0142  NA 130* 136  K 4.1 4.4  CL 99* 106  CO2 22 24  GLUCOSE 168* 86  BUN 26* 22*  CREATININE 0.95 0.73  CALCIUM 7.9* 7.7*    Liver Function Tests:  Recent Labs Lab 12/28/16 1510 12/29/16 0142  AST 58* 54*  ALT 35 34  ALKPHOS 57 46  BILITOT 0.5 0.5  PROT 7.1 6.9  ALBUMIN 3.6 3.1*    CBC:  Recent Labs Lab 12/28/16 1510 12/29/16 0142  WBC 11.3* 12.5*  NEUTROABS 10.4*  --   HGB 16.9* 9.3*  HCT 47.6* 27.3*  MCV 77.7* 78.0  PLT 128* 208    Cardiac Enzymes:  Recent  Labs Lab 12/28/16 1709 12/28/16 1903 12/29/16 0052  TROPONINI 4.87* 4.72* 2.61*    Lipid Panel:    Component Value Date/Time   CHOL 94 12/28/2016 1510   TRIG 54 12/28/2016 1510   HDL 47 12/28/2016 1510   CHOLHDL 2.0 12/28/2016 1510   VLDL 11 12/28/2016 1510   LDLCALC 36 12/28/2016 1510    ECG I personally reviewed the tracing from 12/28/2016 which shows sinus rhythm with PAC, low voltage, decreased R wave progression, and nonspecific T-wave changes.  Imaging  Chest x-ray 12/29/2016: FINDINGS: Lower lung volumes from prior exam with bronchovascular crowding. Cardiomegaly with tortuous atherosclerotic thoracic aorta. Increasing bibasilar and left perihilar streaky opacities. Limited assessment of pulmonary vasculature given technique, question mild pulmonary edema. No pleural fluid. No pneumothorax. Remote right rib fracture.  IMPRESSION: Lower lung volumes from prior exam with bronchovascular crowding. Question mild pulmonary edema versus technique.  Increasing bibasilar and left perihilar streaky opacities, atelectasis versus pneumonia.  Impression  1. Enzymatic evidence of NSTEMI with initial troponin I 5.0 and subsequent downward trend. She does not report any obvious chest pain however, and presents with clinical picture more consistent with pneumonia and UTI. She does have increased cardiac risk factor profile at baseline on reasonable medical therapy as an outpatient. ECG is nonspecific.  2. Community acquired pneumonia, blood cultures negative so far. She is on azithromycin and ceftriaxone per primary team. Influenza screen negative.  3. UTI with culture pending.  4. Essential hypertension on Diovan as an outpatient. Blood pressures overnight somewhat low when she was sleeping, stable this morning. At the present time she is on no antihypertensives.  5. Hyperlipidemia ipidemia on Mevacor as an outpatient. LDL 36.  6. Type 2 diabetes mellitus, on Actos and  Amaryl as an outpatient.  Recommendations  Discussed with patient and family member (who assisted with translation this morning). Continue aspirin and heparin infusion. Resume statin therapy which she was taking as an outpatient. Depending on stability of blood pressure, would try and add low-dose beta blocker next. Echocardiogram will be obtained to assess cardiac structure and function. Would continue to treat for pneumonia and UTI. When she has improved, further discussion can be had regarding diagnostic cardiac catheterization to clarify coronary anatomy and assessment for revascularization options. I did discuss this option with the patient's family member today.  Jonelle SidleSamuel G. Tomasina Keasling, M.D., F.A.C.C.

## 2016-12-29 NOTE — Progress Notes (Signed)
  Echocardiogram 2D Echocardiogram has been performed.  Nolon RodBrown, Tony 12/29/2016, 11:07 AM

## 2016-12-29 NOTE — Evaluation (Signed)
Clinical/Bedside Swallow Evaluation Patient Details  Name: Amy Hood MRN: [id number] Date of Birth: 06-24-1941  Today's Date: 12/29/2016 Time: SLP Start Time (ACUTE ONLY): 1413 SLP Stop Time (ACUTE ONLY): 1426 SLP Time Calculation (min) (ACUTE ONLY): 13 min  Past Medical History:  Past Medical History:  Diagnosis Date  . Arthritis   . Cataracts, bilateral   . Essential hypertension   . Hyperlipidemia   . Type 2 diabetes mellitus (HCC)    Past Surgical History:  Past Surgical History:  Procedure Laterality Date  . KYPHOPLASTY     HPI:  76 y.o.vietnamese speaking femalewith complex PMH including long-standing diabetes mellitus with microvascular complications, dyslipidemia, and hypertension who was brought to ED by family who was concerned about an acute decline in patient's condition.  Dx NSTEMI, afib, DM2, CAP (swallow eval ordered).    Assessment / Plan / Recommendation Clinical Impression  Pt presents with normal oropharyngeal swallow marked by adequate mastication, brisk swallow response, no overt s/s of aspiration.  Pt declined regular solids, but there were no focal deficits nor concerns suggesting modified diet is warranted.  Continue regular solids, thin liquids.  Pt able to follow commands, gestural communication sufficiently to participate in swallow eval despite language barrier.  No SLP needs identified - will sign off.     Aspiration Risk  No limitations    Diet Recommendation   regular solids, thin liquids  Medication Administration: Whole meds with liquid    Other  Recommendations Oral Care Recommendations: Oral care BID   Follow up Recommendations        Frequency and Duration            Prognosis        Swallow Study   General Date of Onset: 12/28/16 HPI: 76 y.o.vietnamese speaking femalewith complex PMH including long-standing diabetes mellitus with microvascular complications, dyslipidemia, and hypertension who was brought to ED by family  who was concerned about an acute decline in patient's condition.  Dx NSTEMI, afib, DM2, CAP (swallow eval ordered).  Type of Study: Bedside Swallow Evaluation Previous Swallow Assessment: no Diet Prior to this Study: Regular;Thin liquids Temperature Spikes Noted: Yes Respiratory Status: Room air History of Recent Intubation: No Behavior/Cognition: Alert;Cooperative Oral Cavity Assessment: Within Functional Limits Oral Care Completed by SLP: No Oral Cavity - Dentition: Edentulous Vision: Functional for self-feeding Self-Feeding Abilities: Able to feed self Patient Positioning: Upright in bed Baseline Vocal Quality: Normal Volitional Cough: Strong Volitional Swallow: Able to elicit    Oral/Motor/Sensory Function Overall Oral Motor/Sensory Function: Within functional limits   Ice Chips Ice chips: Not tested   Thin Liquid Thin Liquid: Within functional limits    Nectar Thick Nectar Thick Liquid: Not tested   Honey Thick Honey Thick Liquid: Not tested   Puree Puree: Within functional limits   Solid   GO   Solid: Not tested (pt declined)        Blenda Mountsouture, Vinetta Brach Laurice 12/29/2016,2:30 PM

## 2016-12-29 NOTE — Progress Notes (Signed)
Patient showing signs of increased WOB with expiratory wheezing. MD paged. Will continue to follow up. Patient currently has an o2 saturation of 96 on room air.

## 2016-12-29 NOTE — Progress Notes (Signed)
PT Cancellation Note  Patient Details Name: Amy Hood MRN: [id number] DOB: 18-Apr-1941   Cancelled Treatment:    Reason Eval/Treat Not Completed: Fatigue/lethargy limiting ability to participate Granddaughter interpreted, patient does not want to get up at this time. Check back tomorrow,   Rada HayHill, Amy Hood 12/29/2016, 4:13 PM Blanchard KelchKaren Mattea Hood PT [phone]

## 2016-12-30 ENCOUNTER — Encounter (HOSPITAL_COMMUNITY): Payer: Self-pay

## 2016-12-30 DIAGNOSIS — I480 Paroxysmal atrial fibrillation: Secondary | ICD-10-CM

## 2016-12-30 DIAGNOSIS — I4891 Unspecified atrial fibrillation: Secondary | ICD-10-CM

## 2016-12-30 DIAGNOSIS — I350 Nonrheumatic aortic (valve) stenosis: Secondary | ICD-10-CM

## 2016-12-30 DIAGNOSIS — M1990 Unspecified osteoarthritis, unspecified site: Secondary | ICD-10-CM

## 2016-12-30 DIAGNOSIS — R748 Abnormal levels of other serum enzymes: Secondary | ICD-10-CM

## 2016-12-30 DIAGNOSIS — I2510 Atherosclerotic heart disease of native coronary artery without angina pectoris: Secondary | ICD-10-CM

## 2016-12-30 DIAGNOSIS — E7800 Pure hypercholesterolemia, unspecified: Secondary | ICD-10-CM

## 2016-12-30 HISTORY — DX: Nonrheumatic aortic (valve) stenosis: I35.0

## 2016-12-30 LAB — COMPREHENSIVE METABOLIC PANEL
ALT: 34 U/L (ref 14–54)
AST: 40 U/L (ref 15–41)
Albumin: 3.2 g/dL — ABNORMAL LOW (ref 3.5–5.0)
Alkaline Phosphatase: 48 U/L (ref 38–126)
Anion gap: 8 (ref 5–15)
BUN: 10 mg/dL (ref 6–20)
CHLORIDE: 102 mmol/L (ref 101–111)
CO2: 22 mmol/L (ref 22–32)
CREATININE: 0.58 mg/dL (ref 0.44–1.00)
Calcium: 7.5 mg/dL — ABNORMAL LOW (ref 8.9–10.3)
GFR calc Af Amer: >60 mL/min (ref >60)
GFR calc non Af Amer: >60 mL/min (ref >60)
Glucose, Bld: 153 mg/dL — ABNORMAL HIGH (ref 65–99)
Potassium: 3.4 mmol/L — ABNORMAL LOW (ref 3.5–5.1)
Sodium: 132 mmol/L — ABNORMAL LOW (ref 135–145)
Total Bilirubin: 0.3 mg/dL (ref 0.3–1.2)
Total Protein: 6.8 g/dL (ref 6.5–8.1)

## 2016-12-30 LAB — HIV ANTIBODY (ROUTINE TESTING W REFLEX): HIV Screen 4th Generation wRfx: NONREACTIVE

## 2016-12-30 LAB — GLUCOSE, CAPILLARY
GLUCOSE-CAPILLARY: 172 mg/dL — AB (ref 65–99)
GLUCOSE-CAPILLARY: 191 mg/dL — AB (ref 65–99)
Glucose-Capillary: 170 mg/dL — ABNORMAL HIGH (ref 65–99)

## 2016-12-30 LAB — HEPARIN LEVEL (UNFRACTIONATED)
HEPARIN UNFRACTIONATED: 0.43 [IU]/mL (ref 0.30–0.70)
Heparin Unfractionated: 0.38 IU/mL (ref 0.30–0.70)

## 2016-12-30 LAB — URINE CULTURE

## 2016-12-30 LAB — CBC
HCT: 27.7 % — ABNORMAL LOW (ref 36.0–46.0)
HEMOGLOBIN: 9.3 g/dL — AB (ref 12.0–15.0)
MCH: 26.1 pg (ref 26.0–34.0)
MCHC: 33.6 g/dL (ref 30.0–36.0)
MCV: 77.6 fL — AB (ref 78.0–100.0)
PLATELETS: 221 10*3/uL (ref 150–400)
RBC: 3.57 MIL/uL — AB (ref 3.87–5.11)
RDW: 13.4 % (ref 11.5–15.5)
WBC: 8.2 10*3/uL (ref 4.0–10.5)

## 2016-12-30 LAB — HEMOGLOBIN A1C
Hgb A1c MFr Bld: 6.3 % — ABNORMAL HIGH (ref 4.8–5.6)
MEAN PLASMA GLUCOSE: 134 mg/dL

## 2016-12-30 LAB — C DIFFICILE QUICK SCREEN W PCR REFLEX
C Diff antigen: NEGATIVE
C Diff interpretation: NOT DETECTED
C Diff toxin: NEGATIVE

## 2016-12-30 LAB — VITAMIN D 25 HYDROXY (VIT D DEFICIENCY, FRACTURES): VIT D 25 HYDROXY: 24.8 ng/mL — AB (ref 30.0–100.0)

## 2016-12-30 LAB — CG4 I-STAT (LACTIC ACID): Lactic Acid, Venous: 0.82 mmol/L (ref 0.5–1.9)

## 2016-12-30 MED ORDER — AZITHROMYCIN 500 MG PO TABS
500.0000 mg | ORAL_TABLET | ORAL | Status: DC
  Administered 2016-12-30 – 2017-01-01 (×2): 500 mg via ORAL
  Filled 2016-12-30 (×2): qty 1
  Filled 2016-12-30: qty 2

## 2016-12-30 MED ORDER — ACETAMINOPHEN 325 MG PO TABS
650.0000 mg | ORAL_TABLET | Freq: Four times a day (QID) | ORAL | Status: DC
  Administered 2016-12-30 – 2016-12-31 (×5): 650 mg via ORAL
  Filled 2016-12-30 (×6): qty 2

## 2016-12-30 MED ORDER — POTASSIUM CHLORIDE CRYS ER 20 MEQ PO TBCR
40.0000 meq | EXTENDED_RELEASE_TABLET | Freq: Once | ORAL | Status: AC
  Administered 2016-12-30: 40 meq via ORAL
  Filled 2016-12-30: qty 2

## 2016-12-30 MED ORDER — HYDROCODONE-ACETAMINOPHEN 5-325 MG PO TABS
1.0000 | ORAL_TABLET | Freq: Four times a day (QID) | ORAL | Status: DC | PRN
  Administered 2016-12-30 – 2016-12-31 (×2): 1 via ORAL
  Filled 2016-12-30 (×2): qty 1

## 2016-12-30 MED ORDER — SODIUM CHLORIDE 0.9 % IV SOLN
1.0000 g | Freq: Once | INTRAVENOUS | Status: AC
  Administered 2016-12-30: 1 g via INTRAVENOUS
  Filled 2016-12-30: qty 10

## 2016-12-30 NOTE — Progress Notes (Signed)
PHARMACIST - PHYSICIAN COMMUNICATION CONCERNING: Antibiotic IV to Oral Route Change Policy  RECOMMENDATION: This patient is receiving azithromycin by the intravenous route.  Based on criteria approved by the Pharmacy and Therapeutics Committee, the antibiotic(s) is/are being converted to the equivalent oral dose form(s).   DESCRIPTION: These criteria include:  Patient being treated for a respiratory tract infection, urinary tract infection, cellulitis or clostridium difficile associated diarrhea if on metronidazole  The patient is not neutropenic and does not exhibit a GI malabsorption state  The patient is eating (either orally or via tube) and/or has been taking other orally administered medications for a least 24 hours  The patient is improving clinically and has a Tmax < 100.5  If you have questions about this conversion, please contact the Pharmacy Department  []   [phone]( [phone] )  [facility] []   [phone]( [phone]  [facility] []   [phone]( [phone] )  [facility] []   [phone]  [facility] [x]   [phone]( [phone] )  [facility]   Clance BollAmanda Kandis Henry, PharmD, [place] Pager: [phone] 12/30/2016 9:16 AM

## 2016-12-30 NOTE — Progress Notes (Signed)
ANTICOAGULATION CONSULT NOTE   Pharmacy Consult for heparin Indication: chest pain/ACS  No Known Allergies  Patient Measurements: Height: 5' (152.4 cm) Weight: 182 lb 12.2 oz (82.9 kg) IBW/kg (Calculated) : 45.5 Heparin Dosing Weight: 60 kg  Vital Signs: Temp: 99.8 F (37.7 C) (01/22 0800) Temp Source: Oral (01/22 0800) BP: 136/39 (01/22 0800) Pulse Rate: 75 (01/22 0800)  Labs:  Recent Labs  12/28/16 1510  12/28/16 1903 12/29/16 0052  12/29/16 0142 12/29/16 0713 12/29/16 1140 12/29/16 2206 12/30/16 0328 12/30/16 0758  HGB 16.9*  --   --   --   --  9.3*  --   --   --  9.3*  --   HCT 47.6*  --   --   --   --  27.3*  --   --   --  27.7*  --   PLT 128*  --   --   --   --  208  --   --   --  221  --   LABPROT 14.4  --   --   --   --   --   --   --   --   --   --   INR 1.12  --   --   --   --   --   --   --   --   --   --   HEPARINUNFRC  --   --   --   --   < > 0.20*  --  0.18* 0.25*  --  0.38  CREATININE 0.95  --   --   --   --  0.73  --   --   --  0.58  --   TROPONINI  --   < > 4.72* 2.61*  --   --  1.90*  --   --   --   --   < > = values in this interval not displayed.  Estimated Creatinine Clearance: 56.2 mL/min (by C-G formula based on SCr of 0.58 mg/dL).   Medical History: Past Medical History:  Diagnosis Date  . Arthritis   . Cataracts, bilateral   . Essential hypertension   . Hyperlipidemia   . Type 2 diabetes mellitus [facility](HCC)     Assessment: Patient is a  76 y.o F presented to the ED on 12/28/16 with c/o decreased appetite and fluid intake. Troponin was found to be elevated at 4.16.  Heparin infusion for NSTEMI.  Today, 12/30/2016:  Heparin level 0.38, therapeutic with infusion at 1000 units/hr  CBC: Hgb low but stable from yesterday, platelets WNL/stable  No reported bleeding issues  On ASA. Cards following and will discuss cardiac cath when patient improves.  Goal of Therapy:  Heparin level 0.3-0.7 units/ml Monitor platelets by anticoagulation  protocol: Yes   Plan:   Continue heparin infusion at 1000 units/hr  Repeat heparin level in 8h  Daily heparin level and CBC  Clance BollAmanda Yaminah Clayborn, PharmD, BCPS Pager: [phone] 12/30/2016 9:07 AM

## 2016-12-30 NOTE — Progress Notes (Signed)
Progress Note  Patient Name: Amy Hood Date of Encounter: 12/30/2016  Primary Cardiologist: Dr. Diona Browner (new)  Subjective   Complains of headache and R arm pain that have been constant since admission.  Denies chest pain or shortness of breath.   Inpatient Medications    Scheduled Meds: . acetaminophen  650 mg Oral Q6H  . aspirin EC  81 mg Oral Daily  . azithromycin  500 mg Oral Q24H  . cefTRIAXone (ROCEPHIN)  IV  1 g Intravenous Q24H  . insulin aspart  0-15 Units Subcutaneous TID WC  . insulin aspart  3 Units Subcutaneous TID WC  . pravastatin  40 mg Oral q1800   Continuous Infusions: . heparin 1,000 Units/hr (12/30/16 1200)   PRN Meds: albuterol, morphine injection, nitroGLYCERIN   Vital Signs    Vitals:   12/30/16 1100 12/30/16 1113 12/30/16 1200 12/30/16 1300  BP:  (!) 129/43 (!) 120/95   Pulse: 65 75 72 69  Resp: 18 (!) 23 20 20   Temp:   98.5 F (36.9 C)   TempSrc:   Oral   SpO2: 92% 97% 95% 97%  Weight:      Height:        Intake/Output Summary (Last 24 hours) at 12/30/16 1535 Last data filed at 12/30/16 1200  Gross per 24 hour  Intake           1512.5 ml  Output                4 ml  Net           1508.5 ml   Filed Weights   12/28/16 1714 12/28/16 2000  Weight: 65.8 kg (145 lb) 82.9 kg (182 lb 12.2 oz)    Telemetry    Sinus rhythm and atrial fibrillation - Personally Reviewed  ECG    12/30/16: Sinus rhythm.  Rate 76 bpm.  Prior inferior infarct.  Anterolateral TWI.   - Personally Reviewed  Physical Exam   GEN: No acute distress.  Neck: No JVD Cardiac: Irregularly irregular.  No murmurs, rubs, or gallops.  Respiratory: Scattered rhonchi bilaterally  GI: Soft, nontender, non-distended  MS: No edema; No deformity. Neuro:  AAOx3. Psych: Normal affect  Labs    Chemistry Recent Labs Lab 12/28/16 1510 12/29/16 0142 12/30/16 0328  NA 130* 136 132*  K 4.1 4.4 3.4*  CL 99* 106 102  CO2 22 24 22   GLUCOSE 168* 86 153*  BUN 26*  22* 10  CREATININE 0.95 0.73 0.58  CALCIUM 7.9* 7.7* 7.5*  PROT 7.1 6.9 6.8  ALBUMIN 3.6 3.1* 3.2*  AST 58* 54* 40  ALT 35 34 34  ALKPHOS 57 46 48  BILITOT 0.5 0.5 0.3  GFRNONAA 56* >60 >60  GFRAA >60 >60 >60  ANIONGAP 9 6 8      Hematology Recent Labs Lab 12/28/16 1510 12/29/16 0142 12/30/16 0328  WBC 11.3* 12.5* 8.2  RBC 6.13* 3.50* 3.57*  HGB 16.9* 9.3* 9.3*  HCT 47.6* 27.3* 27.7*  MCV 77.7* 78.0 77.6*  MCH 27.6 26.6 26.1  MCHC 35.5 34.1 33.6  RDW 13.5 13.5 13.4  PLT 128* 208 221    Cardiac Enzymes Recent Labs Lab 12/28/16 1709 12/28/16 1903 12/29/16 0052 12/29/16 0713  TROPONINI 4.87* 4.72* 2.61* 1.90*    Recent Labs Lab 12/28/16 1610 12/28/16 1625  TROPIPOC 5.01* 4.16*     BNPNo results for input(s): BNP, PROBNP in the last 168 hours.   DDimer No results for input(s): DDIMER in  the last 168 hours.   Radiology    Dg Chest Port 1 View  Result Date: 12/29/2016 CLINICAL DATA:  Pneumonia EXAM: PORTABLE CHEST 1 VIEW COMPARISON:  12/29/2016 FINDINGS: Stable enlarged to cardiac silhouette. Interval improvement in central venous congestion. No pneumothorax. IMPRESSION: Cardiomegaly with improved central venous congestion. Electronically Signed   By: Genevive BiStewart  Edmunds M.D.   On: 12/29/2016 18:59   Dg Chest Port 1 View  Result Date: 12/29/2016 CLINICAL DATA:  Pneumonia. EXAM: PORTABLE CHEST 1 VIEW COMPARISON:  Frontal and lateral views yesterday. FINDINGS: Lower lung volumes from prior exam with bronchovascular crowding. Cardiomegaly with tortuous atherosclerotic thoracic aorta. Increasing bibasilar and left perihilar streaky opacities. Limited assessment of pulmonary vasculature given technique, question mild pulmonary edema. No pleural fluid. No pneumothorax. Remote right rib fracture. IMPRESSION: Lower lung volumes from prior exam with bronchovascular crowding. Question mild pulmonary edema versus technique. Increasing bibasilar and left perihilar streaky  opacities, atelectasis versus pneumonia. Electronically Signed   By: Rubye OaksMelanie  Ehinger M.D.   On: 12/29/2016 05:38    Cardiac Studies   Echo 12/29/16: Study Conclusions  - Left ventricle: The cavity size was normal. Wall thickness was   increased in a pattern of mild LVH. Systolic function was normal.   The estimated ejection fraction was in the range of 55% to 60%.   There is hypokinesis of the apicalinferior and apical myocardium.   Doppler parameters are consistent with abnormal left ventricular   relaxation (grade 1 diastolic dysfunction). - Aortic valve: Mildly calcified annulus. Trileaflet; mildly   calcified leaflets. Mean gradient (S): 13 mm Hg. Peak gradient   (S): 21 mm Hg. VTI ratio of LVOT to aortic valve: 0.56. - Mitral valve: Calcified annulus. There was trivial regurgitation. - Right ventricle: The cavity size was mildly dilated. Systolic   function was reduced. - Right atrium: Central venous pressure (est): 15 mm Hg. - Tricuspid valve: There was trivial regurgitation. - Pulmonary arteries: PA peak pressure: 35 mm Hg (S). - Pericardium, extracardiac: There was no pericardial effusion.  Impressions:  - Mild LVH with LVEF 55-60%. There is hypokinesis of the apical   inferior and apical myocardium. Grade 1 diastolic dysfunction.   Calcified mitral annulus with trivial mitral regurgitation.   Moderately sclerotic aortic valve without obvious stenosis.   Limited views of the right ventricle suggest at least mild   dilatation and significantly reduced contraction. Trivial   tricuspid regurgitation with PASP 35 mmHg and estimated elevated   CVP.  Patient Profile     76 y.o. female with mild aortic stenosis, hypertension, hyperlipidemia, here with NSTEMI in the setting on community-acquired pneumonia and UTI.  Assessment & Plan    # NSTEMI: Troponin elevated to 5.  She denies chest pain but does have R arm pain. EKG shows inferior Q waves and anterolateral T wave  inversions, both of which are new since her last EKG in 2014.  Her respiratory status has improved. We will [facility] with Dr. Eldridge DaceVaranasi.  Continue aspirin and heparin.    # Paroxysmal atrial fibrillation: [name] has been in and out of atrial fibrillation.  Rates in atrial fibrillation are well-controlled. We will plan to start oral anticoagulation after cath.  Continue heparin for now.  This patients CHA2DS2-VASc Score and unadjusted Ischemic Stroke Rate (% per year) is equal to 7.2 % stroke rate/year from a score of 5  Above score calculated as 1 point each if present [CHF, HTN, DM, Vascular=MI/PAD/Aortic Plaque, Age  if 65-74, or Female] Above score calculated as 2 points each if present [Age > 75, or Stroke/TIA/TE]  # Hypertension: BP has been labile.  We will likely resume her home valsartan tomorrow.    # Hyperlipidemia:  Amy Hood is on lovastatin as an outpatient.  Pravastatin was substituted on admission.  We will check fasting lipids.  If she has significant CAD on cath, we will likely switch this to atorvastatin or rosuvastatin.   # CAP: # UTI: Antibiotics per IM   Signed, Chilton Si, MD  12/30/2016, 3:35 PM

## 2016-12-30 NOTE — Progress Notes (Signed)
Patient is incontinent of bowel and bladder, family said this is a new development for her since this hospitalization.  5 incontinence episodes was reported during the day shift, she has had a total of three on this shift. Stool is greenish brown and loose. Patient placed on enteric precaution for now, awaiting further orders from physician.

## 2016-12-30 NOTE — Progress Notes (Signed)
Pharmacy - Brief Note (heparin level follow-up)  Pharmacy Consult for heparin Indication: chest pain/ACS  Heparin level = 0.43 on heparin 1000 units/hr  Plan:  Heparin level therapeutic x 2 on current rate, continue heparin at 1000 units/hr  Daily heparin level and CBC  For cath tomorrow  Juliette Alcideustin Granvel [place]oudfoot, PharmD, BCPS.   Pager: [phone] 12/30/2016 5:15 PM

## 2016-12-30 NOTE — Evaluation (Signed)
Occupational Therapy Evaluation Patient Details Name: Amy Hood MRN: [id number] DOB: 07-Jun-1941 Today's Date: 12/30/2016    History of Present Illness 76 y.o. vietnamese speaking female with complex PMH including long-standing diabetes mellitus with microvascular complications, dyslipidemia, and hypertension who was brought to ED by family who was concerned about an acute decline in patient's condition.  Dx NSTEMI, afib, DM2, CAP    Clinical Impression   This 76 yo female admitted with above presents to acute OT with deficits below (see OT problem list) thus affecting her PLOF of being independent with basic ADLs. Of safety concern at the moment is her incontinence of diarrhea. She will continue to benefit from acute OT without need for follow up.    Follow Up Recommendations  No OT follow up;Supervision/Assistance - 24 hour    Equipment Recommendations  None recommended by OT       Precautions / Restrictions Precautions Precautions: Fall Precaution Comments: loose stools Restrictions Weight Bearing Restrictions: No      Mobility Bed Mobility Overal bed mobility: Needs Assistance Bed Mobility: Supine to Sit     Supine to sit: Min assist        Transfers Overall transfer level: Needs assistance Equipment used: 1 person hand held assist Transfers: Sit to/from [employer] Sit to Stand: Min assist Stand pivot transfers: Min assist            Balance Overall balance assessment: Needs assistance Sitting-balance support: Feet supported;No upper extremity supported Sitting balance-Leahy Scale: Good     Standing balance support: Single extremity supported;During functional activity Standing balance-Leahy Scale: Poor                              ADL Overall ADL's : Needs assistance/impaired Eating/Feeding: Independent;Sitting   Grooming: Set up;Supervision/safety;Standing   Upper Body Bathing: Supervision/ safety;Set  up;Sitting   Lower Body Bathing: Minimal assistance;Sit to/from stand   Upper Body Dressing : Set up;Supervision/safety;Sitting   Lower Body Dressing: Minimal assistance;Sit to/from stand   Toilet Transfer: Minimal assistance;Stand-pivot;BSC   Toileting- Clothing Manipulation and Hygiene: Moderate assistance (min A sit<>stand)                         Pertinent Vitals/Pain Pain Assessment: Faces Faces Pain Scale: Hurts a little bit Pain Location: right shoulder Pain Descriptors / Indicators: Aching;Sore Pain Intervention(s): Monitored during session;Repositioned     Hand Dominance Right   Extremity/Trunk Assessment Upper Extremity Assessment Upper Extremity Assessment: RUE deficits/detail RUE Deficits / Details: C/o right shoulder pain but can move it WNL           Communication Communication Communication: Prefers language other than English (vietnamese)   Cognition Arousal/Alertness: Awake/alert Behavior During Therapy: WFL for tasks assessed/performed                                  Home Living Family/patient expects to be discharged to:: Private residence Living Arrangements: Spouse/significant other;Children;Other relatives Available Help at Discharge: Family;Available 24 hours/day Type of Home: House Home Access: Stairs to enter [employer] of Steps: 5 Entrance Stairs-Rails: Right Home Layout: One level     Bathroom Shower/Tub: Chief Strategy Officer: Standard     Home Equipment: Walker - 2 wheels          Prior Functioning/Environment Level of Independence: Independent  OT Problem List: Decreased strength;Impaired balance (sitting and/or standing);Obesity;Pain   OT Treatment/Interventions: Self-care/ADL training;Therapeutic activities;Patient/family education;DME and/or AE instruction;Balance training    OT Goals(Current goals can be found in the care plan section) Acute Rehab  OT Goals OT Goal Formulation: With patient Time For Goal Achievement: 01/06/17 Potential to Achieve Goals: Good  OT Frequency: Min 2X/week           Co-evaluation PT/OT/SLP Co-Evaluation/Treatment: Yes Reason for Co-Treatment: To address functional/ADL transfers   OT goals addressed during session: ADL's and self-care;Strengthening/ROM      End of Session Nurse Communication: Mobility status  Activity Tolerance: Patient tolerated treatment well Patient left: in chair;with call bell/phone within reach;with chair alarm set;with family/visitor present   Time: [id number] OT Time Calculation (min): 27 min Charges:  OT General Charges $OT Visit: 1 Procedure OT Evaluation $OT Eval Moderate Complexity: 1 Procedure  Evette GeorgesLeonard, Malisha Mabey Eva [phone] 12/30/2016, 12:01 PM

## 2016-12-30 NOTE — Evaluation (Signed)
Physical Therapy Evaluation Patient Details Name: Amy Hood MRN: [id number] DOB: 06-06-1941 Today's Date: 12/30/2016   History of Present Illness  76 y.o. vietnamese speaking female with complex PMH including long-standing diabetes mellitus with microvascular complications, dyslipidemia, and hypertension who was brought to ED by family who was concerned about an acute decline in patient's condition.  Dx NSTEMI, afib, DM2, CAP   Clinical Impression  Pt admitted with above diagnosis. Pt currently with functional limitations due to the deficits listed below (see PT Problem List).  Pt will benefit from skilled PT to increase their independence and safety with mobility to allow discharge to the venue listed below.  Pt assisted OOB to recliner however mobility limited today by incontinence of loose stools.  Granddaughter in room and assisted with interpreting.  Pt will likely return to mobility baseline upon d/c.     Follow Up Recommendations No PT follow up    Equipment Recommendations  None recommended by PT    Recommendations for Other Services       Precautions / Restrictions Precautions Precautions: Fall Precaution Comments: loose stools Restrictions Weight Bearing Restrictions: No      Mobility  Bed Mobility Overal bed mobility: Needs Assistance Bed Mobility: Supine to Sit     Supine to sit: Min assist     General bed mobility comments: slight assist for trunk upright  Transfers Overall transfer level: Needs assistance Equipment used: 1 person hand held assist Transfers: Sit to/from [employer] Sit to Stand: Min assist Stand pivot transfers: Min assist       General transfer comment: assist to rise and steady, bed to [facility] with loose stools getting over to [facility], BSC removed and pulled recliner under pt  Ambulation/Gait             General Gait Details: deferred today due to incontinence of loose stools  Stairs            Wheelchair  Mobility    Modified Rankin (Stroke Patients Only)       Balance Overall balance assessment: Needs assistance Sitting-balance support: Feet supported;No upper extremity supported Sitting balance-Leahy Scale: Good     Standing balance support: Single extremity supported;During functional activity Standing balance-Leahy Scale: Poor                               Pertinent Vitals/Pain Pain Assessment: Faces Faces Pain Scale: Hurts a little bit Pain Location: right shoulder Pain Descriptors / Indicators: Aching;Sore Pain Intervention(s): Repositioned;Monitored during session  Vitals WNL upon session    Home Living Family/patient expects to be discharged to:: Private residence Living Arrangements: Spouse/significant other;Children;Other relatives Available Help at Discharge: Family;Available 24 hours/day Type of Home: House Home Access: Stairs to enter Entrance Stairs-Rails: Right Entrance Stairs-Number of Steps: 5 Home Layout: One level Home Equipment: Walker - 2 wheels      Prior Function Level of Independence: Independent               Hand Dominance   Dominant Hand: Right    Extremity/Trunk Assessment   Upper Extremity Assessment Upper Extremity Assessment: RUE deficits/detail RUE Deficits / Details: C/o right shoulder pain but can move it WNL    Lower Extremity Assessment Lower Extremity Assessment: Overall WFL for tasks assessed       Communication   Communication: Prefers language other than English ([place]  Cognition Arousal/Alertness: Awake/alert Behavior During Therapy: WFL for tasks assessed/performed  General Comments      Exercises     Assessment/Plan    PT Assessment Patient needs continued PT services  PT Problem List Decreased activity tolerance;Decreased mobility          PT Treatment Interventions DME instruction;Gait training;Functional mobility training;Therapeutic  exercise;Therapeutic activities;Patient/family education    PT Goals (Current goals can be found in the Care Plan section)  Acute Rehab PT Goals PT Goal Formulation: With patient/family Time For Goal Achievement: 01/06/17 Potential to Achieve Goals: Good    Frequency Min 2X/week   Barriers to discharge        Co-evaluation PT/OT/SLP Co-Evaluation/Treatment: Yes Reason for Co-Treatment: To address functional/ADL transfers PT goals addressed during session: Mobility/safety with mobility OT goals addressed during session: ADL's and self-care;Strengthening/ROM       End of Session   Activity Tolerance: Patient tolerated treatment well Patient left: in chair;with call bell/phone within reach;with chair alarm set Nurse Communication: Mobility status         Time: [id number] PT Time Calculation (min) (ACUTE ONLY): 24 min   Charges:   PT Evaluation $PT Eval Moderate Complexity: 1 Procedure     PT G Codes:        Mercades Bajaj,KATHrine E 12/30/2016, 12:35 PM Zenovia JarredKati Mesiah Manzo, PT, DPT 12/30/2016 Pager: [phone]

## 2016-12-30 NOTE — Progress Notes (Signed)
PROGRESS NOTE    Amy Hood  VHQ:[id number]  DOB: [birth year]  DOA: 12/28/2016 PCP: Georgann Housekeeper, MD Outpatient Specialists:   Hospital course: Amy Hood is a 76 y.o. vietnamese speaking female with complex PMH including long-standing diabetes mellitus with microvascular complications, dyslipidemia, and hypertension who was brought to ED by family who was concerned about an acute decline in patient's condition. Family reports that over past 2-3 days she has been staying in bed, too weak to ambulate or do daily activities. She has had decreased appetite and decreased by mouth intake. They states that she has been complaining of feeling chills and feeling cold. Amy Hood was found to be febrile with pneumonia seen on chest xray and infected urine was seen.  In addition, patient noted to have elevated troponin I.  She was started on IV heparin infusion and IV antibiotics and admission was requested.   Assessment & Plan:   1. NSTEMI - Admitted to SDU for IV heparin infusion. Cardiology has been consulted. Lipids ok. Follow telemetry. ASA, Morphine, NTG, Oxygen ordered.   2. Atrial Fibrillation with slow ventricular response - seen on EKG, repeat EKG this morning. monitor on telemetry, IV heparin infusion.  Further mgmt per cardiology service. 3. Diabetes mellitus type 2, insulin requiring with complications- Follow and adjust, home meds not known at this time, treating with subcut insulin sliding scale in hospital. A1c pending.  4. Hyponatremia - secondary to hyperglycemia and dehydration, much improved.  5. UTI - Treating with IV ceftriaxone, follow urine culture and sensitivities.  6. CAP - Treating with IV ceftriaxone and azithromycin, follow cultures, oxygen support. Check swallow evaluation. Nebs ordered for wheezing. CXR looks better.  7. Essential Hypertension - following.  Adjust therapies as needed.  8. Thrombocytopenia - platelets have been normal last 2 days.  9. Generalized weakness  - multifactorial causes noted above, Amy Hood eval ordered but patient refused, try again today.  Fall precautions.  10. Loose stools - checking stool for c diff infection.  11. Right shoulder pain - likely osteoarthritis - will schedule tylenol.  12. Hard of Hearing and Non-English speaking - Communication will be difficult with patient but will make every effort.  Likely will have to try written communication if the patient can read and write which family not able to tell me at this time.  Tried to use interpreter through phone but patient unable to hear and interpreter unable to get through to patient.      DVT Prophylaxis: IV heparin infusion Code Status: full  Family Communication: bedside  Disposition Plan: TBD   Subjective: Amy Hood  Having loose stools and has been incontinent.  c diff pending   Objective: Vitals:   12/30/16 0000 12/30/16 0200 12/30/16 0306 12/30/16 0447  BP: (!) 149/61 (!) 148/52  (!) 146/43  Pulse: 78 77  78  Resp: (!) 25 (!) 25  (!) 25  Temp:   99.7 F (37.6 C)   TempSrc:   Axillary   SpO2: 93% 94%  95%  Weight:      Height:        Intake/Output Summary (Last 24 hours) at 12/30/16 0723 Last data filed at 12/30/16 0100  Gross per 24 hour  Intake          1347.85 ml  Output                6 ml  Net          1341.85 ml   [employer]  12/28/16 1714 12/28/16 2000  Weight: 65.8 kg (145 lb) 82.9 kg (182 lb 12.2 oz)    Exam:  General exam: Amy Hood is Hard of hearing.  Moderately built and nourished patient, lying comfortably supine on the gurney in no obvious distress. Respiratory system: Clear. No increased work of breathing. Cardiovascular system: S1 & S2 heard, RRR.  Gastrointestinal system: Abdomen is nondistended, soft and nontender. Normal bowel sounds heard. Central nervous system: Alert and oriented. No focal neurological deficits. Extremities: no cyanosis.  Data Reviewed: Basic Metabolic Panel:  Recent Labs Lab 12/28/16 1510 12/29/16 0142  12/30/16 0328  NA 130* 136 132*  K 4.1 4.4 3.4*  CL 99* 106 102  CO2 22 24 22   GLUCOSE 168* 86 153*  BUN 26* 22* 10  CREATININE 0.95 0.73 0.58  CALCIUM 7.9* 7.7* 7.5*   Liver Function Tests:  Recent Labs Lab 12/28/16 1510 12/29/16 0142 12/30/16 0328  AST 58* 54* 40  ALT 35 34 34  ALKPHOS 57 46 48  BILITOT 0.5 0.5 0.3  PROT 7.1 6.9 6.8  ALBUMIN 3.6 3.1* 3.2*   No results for input(s): LIPASE, AMYLASE in the last 168 hours. No results for input(s): AMMONIA in the last 168 hours. CBC:  Recent Labs Lab 12/28/16 1510 12/29/16 0142 12/30/16 0328  WBC 11.3* 12.5* 8.2  NEUTROABS 10.4*  --   --   HGB 16.9* 9.3* 9.3*  HCT 47.6* 27.3* 27.7*  MCV 77.7* 78.0 77.6*  PLT 128* 208 221   Cardiac Enzymes:  Recent Labs Lab 12/28/16 1709 12/28/16 1903 12/29/16 0052 12/29/16 0713  TROPONINI 4.87* 4.72* 2.61* 1.90*   CBG (last 3)   Recent Labs  12/29/16 1222 12/29/16 1621 12/29/16 2128  GLUCAP 82 221* 187*   Recent Results (from the past 240 hour(s))  Culture, blood (Routine x 2)     Status: None (Preliminary result)   Collection Time: 12/28/16  3:10 PM  Result Value Ref Range Status   Specimen Description BLOOD LEFT ARM  Final   Special Requests BOTTLES DRAWN AEROBIC AND ANAEROBIC  Final   Culture PENDING  Incomplete   Report Status PENDING  Incomplete  Urine culture     Status: Abnormal (Preliminary result)   Collection Time: 12/28/16  4:07 PM  Result Value Ref Range Status   Specimen Description URINE, RANDOM  Final   Special Requests NONE  Final   Culture (A)  Final    >=100,000 COLONIES/mL ESCHERICHIA COLI SUSCEPTIBILITIES TO FOLLOW Performed at [facility], [address], [place], [place] [address]    Report Status PENDING  Incomplete  Culture, blood (Routine x 2)     Status: None (Preliminary result)   Collection Time: 12/28/16  4:15 PM  Result Value Ref Range Status   Specimen Description BLOOD BLOOD RIGHT FOREARM  Final   Special  Requests IN PEDIATRIC BOTTLE 1ml  Final   Culture PENDING  Incomplete   Report Status PENDING  Incomplete  MRSA PCR Screening     Status: None   Collection Time: 12/28/16  6:48 PM  Result Value Ref Range Status   MRSA by PCR NEGATIVE NEGATIVE Final    Comment:        The GeneXpert MRSA Assay (FDA approved for NASAL specimens only), is one component of a comprehensive MRSA colonization surveillance program. It is not intended to diagnose MRSA infection nor to guide or monitor treatment for MRSA infections.      Studies: Dg Chest 2 View  Result  Date: 12/28/2016 CLINICAL DATA:  Fever and cough today.  Lethargy. EXAM: CHEST  2 VIEW COMPARISON:  09/18/2013 FINDINGS: Mild cardiomegaly. Aortic atherosclerosis. Patchy density both lung bases consistent with mild bronchopneumonia. No dense consolidation or collapse. Old vertebral augmentation. No acute bone finding. IMPRESSION: Cardiomegaly.  Aortic atherosclerosis. Patchy density both lung bases consistent with mild pneumonia. No dense consolidation or lobar collapse. Electronically Signed   By: Paulina FusiMark  Shogry M.D.   On: 12/28/2016 15:32   Dg Chest Port 1 View  Result Date: 12/29/2016 CLINICAL DATA:  Pneumonia EXAM: PORTABLE CHEST 1 VIEW COMPARISON:  12/29/2016 FINDINGS: Stable enlarged to cardiac silhouette. Interval improvement in central venous congestion. No pneumothorax. IMPRESSION: Cardiomegaly with improved central venous congestion. Electronically Signed   By: Genevive BiStewart  Edmunds M.D.   On: 12/29/2016 18:59   Dg Chest Port 1 View  Result Date: 12/29/2016 CLINICAL DATA:  Pneumonia. EXAM: PORTABLE CHEST 1 VIEW COMPARISON:  Frontal and lateral views yesterday. FINDINGS: Lower lung volumes from prior exam with bronchovascular crowding. Cardiomegaly with tortuous atherosclerotic thoracic aorta. Increasing bibasilar and left perihilar streaky opacities. Limited assessment of pulmonary vasculature given technique, question mild pulmonary edema. No  pleural fluid. No pneumothorax. Remote right rib fracture. IMPRESSION: Lower lung volumes from prior exam with bronchovascular crowding. Question mild pulmonary edema versus technique. Increasing bibasilar and left perihilar streaky opacities, atelectasis versus pneumonia. Electronically Signed   By: Rubye OaksMelanie  Ehinger M.D.   On: 12/29/2016 05:38     Scheduled Meds: . aspirin EC  81 mg Oral Daily  . azithromycin  500 mg Intravenous Q24H  . calcium gluconate  1 g Intravenous Once  . cefTRIAXone (ROCEPHIN)  IV  1 g Intravenous Q24H  . insulin aspart  0-15 Units Subcutaneous TID WC  . insulin aspart  3 Units Subcutaneous TID WC  . potassium chloride  40 mEq Oral Once  . pravastatin  40 mg Oral q1800   Continuous Infusions: . heparin 1,000 Units/hr (12/29/16 2320)    Principal Problem:   NSTEMI (non-ST elevated myocardial infarction) (HCC) Active Problems:   CAP (community acquired pneumonia)   Non-English speaking patient   Atrial fibrillation with slow ventricular response (HCC)   Hyperlipidemia   Cataracts, bilateral   Hypertension   Arthritis   Fever   Leukocytosis   Hyponatremia   UTI (urinary tract infection)   Elevated troponin   CAD (coronary artery disease)   Abnormal ECG   Generalized weakness   Thrombocytopenia (HCC)   Hard of hearing   Acute cystitis without hematuria  Critical Care Time spent: 32 mins  Standley Dakinslanford Taylie Helder, MD, FAAFP [facility] Pager [phone]  If 7PM-7AM, please contact night-coverage [email or web address] Password [email or web address] 12/30/2016, 7:23 AM    LOS: 2 days

## 2016-12-31 ENCOUNTER — Ambulatory Visit (HOSPITAL_COMMUNITY): Admit: 2016-12-31 | Payer: Medicare Other | Admitting: Interventional Cardiology

## 2016-12-31 ENCOUNTER — Encounter (HOSPITAL_COMMUNITY)
Admission: EM | Disposition: A | Discharge status: Home / Self Care | Payer: Self-pay | Source: Home / Self Care | Attending: Family Medicine

## 2016-12-31 DIAGNOSIS — L8990 Pressure ulcer of unspecified site, unspecified stage: Secondary | ICD-10-CM | POA: Insufficient documentation

## 2016-12-31 DIAGNOSIS — R7881 Bacteremia: Secondary | ICD-10-CM

## 2016-12-31 HISTORY — PX: CARDIAC CATHETERIZATION: SHX172

## 2016-12-31 LAB — COMPREHENSIVE METABOLIC PANEL
ALBUMIN: 3 g/dL — AB (ref 3.5–5.0)
ALT: 29 U/L (ref 14–54)
AST: 29 U/L (ref 15–41)
Alkaline Phosphatase: 40 U/L (ref 38–126)
Anion gap: 8 (ref 5–15)
BUN: 9 mg/dL (ref 6–20)
CHLORIDE: 105 mmol/L (ref 101–111)
CO2: 21 mmol/L — AB (ref 22–32)
CREATININE: 0.56 mg/dL (ref 0.44–1.00)
Calcium: 7.9 mg/dL — ABNORMAL LOW (ref 8.9–10.3)
GFR calc Af Amer: >60 mL/min (ref >60)
GFR calc non Af Amer: >60 mL/min (ref >60)
Glucose, Bld: 145 mg/dL — ABNORMAL HIGH (ref 65–99)
POTASSIUM: 4 mmol/L (ref 3.5–5.1)
SODIUM: 134 mmol/L — AB (ref 135–145)
Total Bilirubin: 0.2 mg/dL — ABNORMAL LOW (ref 0.3–1.2)
Total Protein: 6.4 g/dL — ABNORMAL LOW (ref 6.5–8.1)

## 2016-12-31 LAB — HEPARIN LEVEL (UNFRACTIONATED): HEPARIN UNFRACTIONATED: 0.3 [IU]/mL (ref 0.30–0.70)

## 2016-12-31 LAB — BLOOD CULTURE ID PANEL (REFLEXED)
Acinetobacter baumannii: NOT DETECTED
CANDIDA TROPICALIS: NOT DETECTED
Candida albicans: NOT DETECTED
Candida glabrata: NOT DETECTED
Candida krusei: NOT DETECTED
Candida parapsilosis: NOT DETECTED
Carbapenem resistance: NOT DETECTED
ENTEROBACTER CLOACAE COMPLEX: NOT DETECTED
ESCHERICHIA COLI: DETECTED — AB
Enterobacteriaceae species: DETECTED — AB
Enterococcus species: NOT DETECTED
HAEMOPHILUS INFLUENZAE: NOT DETECTED
Klebsiella oxytoca: NOT DETECTED
Klebsiella pneumoniae: NOT DETECTED
LISTERIA MONOCYTOGENES: NOT DETECTED
NEISSERIA MENINGITIDIS: NOT DETECTED
PROTEUS SPECIES: NOT DETECTED
Pseudomonas aeruginosa: NOT DETECTED
SERRATIA MARCESCENS: NOT DETECTED
STAPHYLOCOCCUS SPECIES: NOT DETECTED
STREPTOCOCCUS AGALACTIAE: NOT DETECTED
STREPTOCOCCUS PNEUMONIAE: NOT DETECTED
Staphylococcus aureus (BCID): NOT DETECTED
Streptococcus pyogenes: NOT DETECTED
Streptococcus species: NOT DETECTED

## 2016-12-31 LAB — CBC
HCT: 26.7 % — ABNORMAL LOW (ref 36.0–46.0)
Hemoglobin: 9.2 g/dL — ABNORMAL LOW (ref 12.0–15.0)
MCH: 26.5 pg (ref 26.0–34.0)
MCHC: 34.5 g/dL (ref 30.0–36.0)
MCV: 76.9 fL — AB (ref 78.0–100.0)
PLATELETS: 252 10*3/uL (ref 150–400)
RBC: 3.47 MIL/uL — AB (ref 3.87–5.11)
RDW: 13.5 % (ref 11.5–15.5)
WBC: 6.1 10*3/uL (ref 4.0–10.5)

## 2016-12-31 LAB — LIPID PANEL
CHOL/HDL RATIO: 2.8 ratio
Cholesterol: 116 mg/dL (ref 0–200)
HDL: 41 mg/dL (ref >40)
LDL Cholesterol: 52 mg/dL (ref 0–99)
Triglycerides: 117 mg/dL (ref <150)
VLDL: 23 mg/dL (ref 0–40)

## 2016-12-31 LAB — GLUCOSE, CAPILLARY
GLUCOSE-CAPILLARY: 135 mg/dL — AB (ref 65–99)
GLUCOSE-CAPILLARY: 172 mg/dL — AB (ref 65–99)
Glucose-Capillary: 108 mg/dL — ABNORMAL HIGH (ref 65–99)
Glucose-Capillary: 151 mg/dL — ABNORMAL HIGH (ref 65–99)
Glucose-Capillary: 141 mg/dL — ABNORMAL HIGH (ref 65–99)

## 2016-12-31 LAB — PROTIME-INR
INR: 1.04
PROTHROMBIN TIME: 13.6 s (ref 11.4–15.2)

## 2016-12-31 LAB — POCT ACTIVATED CLOTTING TIME: Activated Clotting Time: 323 seconds

## 2016-12-31 SURGERY — LEFT HEART CATH AND CORONARY ANGIOGRAPHY

## 2016-12-31 MED ORDER — LABETALOL HCL 5 MG/ML IV SOLN: 10.0000 mg | INTRAVENOUS | Status: AC | PRN

## 2016-12-31 MED ORDER — SODIUM CHLORIDE 0.9 % IV SOLN: INTRAVENOUS | Status: AC

## 2016-12-31 MED ORDER — ONDANSETRON HCL 4 MG/2ML IJ SOLN: 4.0000 mg | Freq: Four times a day (QID) | INTRAMUSCULAR | Status: DC | PRN

## 2016-12-31 MED ORDER — SODIUM CHLORIDE 0.9% FLUSH
3.0000 mL | Freq: Two times a day (BID) | INTRAVENOUS | Status: DC
  Administered 2017-01-01 – 2017-01-02 (×2): 3 mL via INTRAVENOUS

## 2016-12-31 MED ORDER — IRBESARTAN 75 MG PO TABS
75.0000 mg | ORAL_TABLET | Freq: Every day | ORAL | Status: DC
  Administered 2017-01-01 – 2017-01-02 (×2): 75 mg via ORAL
  Filled 2016-12-31 (×3): qty 1

## 2016-12-31 MED ORDER — SODIUM CHLORIDE 0.9 % WEIGHT BASED INFUSION
3.0000 mL/kg/h | INTRAVENOUS | Status: DC
  Administered 2016-12-31: 3 mL/kg/h via INTRAVENOUS

## 2016-12-31 MED ORDER — LIDOCAINE HCL (PF) 1 % IJ SOLN
INTRAMUSCULAR | Status: DC | PRN
  Administered 2016-12-31: 2 mL via INTRADERMAL

## 2016-12-31 MED ORDER — ANGIOPLASTY BOOK
Freq: Once | Status: AC
  Administered 2016-12-31: 22:00:00
  Filled 2016-12-31: qty 1

## 2016-12-31 MED ORDER — SODIUM CHLORIDE 0.9 % WEIGHT BASED INFUSION: 3.0000 mL/kg/h | INTRAVENOUS | Status: DC

## 2016-12-31 MED ORDER — IOPAMIDOL (ISOVUE-370) INJECTION 76%
INTRAVENOUS | Status: AC
  Filled 2016-12-31: qty 100

## 2016-12-31 MED ORDER — SODIUM CHLORIDE 0.9 % WEIGHT BASED INFUSION
1.0000 mL/kg/h | INTRAVENOUS | Status: DC
  Administered 2016-12-31: 1 mL/kg/h via INTRAVENOUS

## 2016-12-31 MED ORDER — MIDAZOLAM HCL 2 MG/2ML IJ SOLN
INTRAMUSCULAR | Status: AC
  Filled 2016-12-31: qty 2

## 2016-12-31 MED ORDER — TICAGRELOR 90 MG PO TABS
ORAL_TABLET | ORAL | Status: DC | PRN
  Administered 2016-12-31: 180 mg via ORAL

## 2016-12-31 MED ORDER — HYDRALAZINE HCL 20 MG/ML IJ SOLN: 5.0000 mg | INTRAMUSCULAR | Status: AC | PRN

## 2016-12-31 MED ORDER — TICAGRELOR 90 MG PO TABS
ORAL_TABLET | ORAL | Status: AC
  Filled 2016-12-31: qty 2

## 2016-12-31 MED ORDER — HEPARIN SODIUM (PORCINE) 1000 UNIT/ML IJ SOLN
INTRAMUSCULAR | Status: AC
  Filled 2016-12-31: qty 1

## 2016-12-31 MED ORDER — DEXTROSE 5 % IV SOLN
2.0000 g | INTRAVENOUS | Status: DC
  Administered 2016-12-31 – 2017-01-02 (×3): 2 g via INTRAVENOUS
  Filled 2016-12-31 (×4): qty 2

## 2016-12-31 MED ORDER — MIDAZOLAM HCL 2 MG/2ML IJ SOLN
INTRAMUSCULAR | Status: DC | PRN
  Administered 2016-12-31: 2 mg via INTRAVENOUS

## 2016-12-31 MED ORDER — ASPIRIN 81 MG PO CHEW
81.0000 mg | CHEWABLE_TABLET | ORAL | Status: AC
  Administered 2016-12-31: 81 mg via ORAL
  Filled 2016-12-31: qty 1

## 2016-12-31 MED ORDER — SODIUM CHLORIDE 0.9 % IV SOLN: 250.0000 mL | INTRAVENOUS | Status: DC | PRN

## 2016-12-31 MED ORDER — TIROFIBAN HCL IN NACL 5-0.9 MG/100ML-% IV SOLN
INTRAVENOUS | Status: AC
  Filled 2016-12-31: qty 100

## 2016-12-31 MED ORDER — SODIUM CHLORIDE 0.9 % WEIGHT BASED INFUSION: 1.0000 mL/kg/h | INTRAVENOUS | Status: DC

## 2016-12-31 MED ORDER — HEPARIN SODIUM (PORCINE) 1000 UNIT/ML IJ SOLN
INTRAMUSCULAR | Status: DC | PRN
  Administered 2016-12-31: 2000 [IU] via INTRAVENOUS
  Administered 2016-12-31: 5000 [IU] via INTRAVENOUS
  Administered 2016-12-31: 4000 [IU] via INTRAVENOUS

## 2016-12-31 MED ORDER — TIROFIBAN (AGGRASTAT) BOLUS VIA INFUSION
INTRAVENOUS | Status: DC | PRN
  Administered 2016-12-31: 2050 ug via INTRAVENOUS

## 2016-12-31 MED ORDER — ASPIRIN 81 MG PO CHEW
81.0000 mg | CHEWABLE_TABLET | ORAL | Status: DC
Start: 2017-01-01 — End: 2016-12-31

## 2016-12-31 MED ORDER — LIDOCAINE HCL (PF) 1 % IJ SOLN
INTRAMUSCULAR | Status: AC
  Filled 2016-12-31: qty 30

## 2016-12-31 MED ORDER — FENTANYL CITRATE (PF) 100 MCG/2ML IJ SOLN
INTRAMUSCULAR | Status: AC
  Filled 2016-12-31: qty 2

## 2016-12-31 MED ORDER — HEART ATTACK BOUNCING BOOK
Freq: Once | Status: AC
  Administered 2016-12-31: 22:00:00
  Filled 2016-12-31: qty 1

## 2016-12-31 MED ORDER — SODIUM CHLORIDE 0.9 % IV SOLN
250.0000 mL | INTRAVENOUS | Status: DC | PRN
Start: 2016-12-31 — End: 2016-12-31

## 2016-12-31 MED ORDER — HEPARIN (PORCINE) IN NACL 2-0.9 UNIT/ML-% IJ SOLN
INTRAMUSCULAR | Status: DC | PRN
  Administered 2016-12-31: 1000 mL

## 2016-12-31 MED ORDER — TIROFIBAN HCL IN NACL 5-0.9 MG/100ML-% IV SOLN
INTRAVENOUS | Status: DC | PRN
  Administered 2016-12-31: 0.075 ug/kg/min via INTRAVENOUS

## 2016-12-31 MED ORDER — ACETAMINOPHEN 325 MG PO TABS: 650.0000 mg | ORAL_TABLET | ORAL | Status: DC | PRN

## 2016-12-31 MED ORDER — HEPARIN (PORCINE) IN NACL 2-0.9 UNIT/ML-% IJ SOLN
INTRAMUSCULAR | Status: AC
  Filled 2016-12-31: qty 1000

## 2016-12-31 MED ORDER — TIROFIBAN HCL IN NACL 5-0.9 MG/100ML-% IV SOLN
0.1500 ug/kg/min | INTRAVENOUS | Status: AC
  Filled 2016-12-31: qty 100

## 2016-12-31 MED ORDER — TICAGRELOR 90 MG PO TABS
90.0000 mg | ORAL_TABLET | Freq: Two times a day (BID) | ORAL | Status: DC
  Administered 2017-01-01 – 2017-01-02 (×3): 90 mg via ORAL
  Filled 2016-12-31 (×3): qty 1

## 2016-12-31 MED ORDER — ASPIRIN 81 MG PO CHEW: 81.0000 mg | CHEWABLE_TABLET | Freq: Every day | ORAL | Status: DC

## 2016-12-31 MED ORDER — VERAPAMIL HCL 2.5 MG/ML IV SOLN
INTRAVENOUS | Status: AC
  Filled 2016-12-31: qty 2

## 2016-12-31 MED ORDER — SODIUM CHLORIDE 0.9% FLUSH: 3.0000 mL | Freq: Two times a day (BID) | INTRAVENOUS | Status: DC

## 2016-12-31 MED ORDER — FENTANYL CITRATE (PF) 100 MCG/2ML IJ SOLN
INTRAMUSCULAR | Status: DC | PRN
  Administered 2016-12-31: 25 ug via INTRAVENOUS

## 2016-12-31 MED ORDER — IOPAMIDOL (ISOVUE-370) INJECTION 76%
INTRAVENOUS | Status: DC | PRN
  Administered 2016-12-31: 145 mL via INTRA_ARTERIAL

## 2016-12-31 MED ORDER — SODIUM CHLORIDE 0.9% FLUSH: 3.0000 mL | INTRAVENOUS | Status: DC | PRN

## 2016-12-31 SURGICAL SUPPLY — 19 items
BALLN MOZEC 2.50X20 (BALLOONS) ×2
BALLN ~~LOC~~ EMERGE MR 3.25X15 (BALLOONS) ×3
BALLOON MOZEC 2.50X20 (BALLOONS) IMPLANT
BALLOON ~~LOC~~ EMERGE MR 3.25X15 (BALLOONS) IMPLANT
CATH 5FR JL3.5 JR4 ANG PIG MP (CATHETERS) ×2 IMPLANT
DEVICE RAD COMP TR BAND LRG (VASCULAR PRODUCTS) ×2 IMPLANT
GLIDESHEATH SLEND SS 6F .021 (SHEATH) ×2 IMPLANT
GUIDE CATH RUNWAY 6FR CLS3 (CATHETERS) ×2 IMPLANT
GUIDEWIRE INQWIRE 1.5J.035X260 (WIRE) IMPLANT
INQWIRE 1.5J .035X260CM (WIRE) ×3
KIT ENCORE 26 ADVANTAGE (KITS) ×2 IMPLANT
KIT HEART LEFT (KITS) ×3 IMPLANT
PACK CARDIAC CATHETERIZATION (CUSTOM PROCEDURE TRAY) ×3 IMPLANT
STENT SYNERGY DES 3.5X28 (Permanent Stent) ×2 IMPLANT
STENT SYNERGY DES 3X24 (Permanent Stent) ×2 IMPLANT
TRANSDUCER W/STOPCOCK (MISCELLANEOUS) ×3 IMPLANT
TUBING CIL FLEX 10 FLL-RA (TUBING) ×3 IMPLANT
VALVE GUARDIAN II ~~LOC~~ HEMO (MISCELLANEOUS) ×2 IMPLANT
WIRE ASAHI PROWATER 180CM (WIRE) ×2 IMPLANT

## 2016-12-31 NOTE — Interval H&P Note (Signed)
Cath Lab Visit (complete for each Cath Lab visit)  Clinical Evaluation Leading to the Procedure:   ACS: Yes.    Non-ACS:    Anginal Classification: CCS IV  Anti-ischemic medical therapy: Minimal Therapy (1 class of medications)  Non-Invasive Test Results: No non-invasive testing performed  Prior CABG: No previous CABG      History and Physical Interval Note:  12/31/2016 3:29 PM  Amy Hood  has presented today for surgery, with the diagnosis of n stemi  The various methods of treatment have been discussed with the patient and family. After consideration of risks, benefits and other options for treatment, the patient has consented to  Procedure(s): Left Heart Cath and Coronary Angiography (N/A) as a surgical intervention .  The patient's history has been reviewed, patient examined, no change in status, stable for surgery.  I have reviewed the patient's chart and labs.  Questions were answered to the patient's satisfaction.     Lance MussJayadeep Caral Whan

## 2016-12-31 NOTE — H&P (View-Only) (Signed)
 Progress Note  Patient Name: Amy Hood Date of Encounter: 12/31/2016  Primary Cardiologist: Dr. McDowell  Subjective   Daughter at the bedside. For cath later today. No chest pain overnight.   Inpatient Medications    Scheduled Meds: . acetaminophen  650 mg Oral Q6H  . aspirin  81 mg Oral Pre-Cath  . aspirin EC  81 mg Oral Daily  . azithromycin  500 mg Oral Q24H  . cefTRIAXone (ROCEPHIN)  IV  2 g Intravenous Q24H  . insulin aspart  0-15 Units Subcutaneous TID WC  . insulin aspart  3 Units Subcutaneous TID WC  . pravastatin  40 mg Oral q1800  . sodium chloride flush  3 mL Intravenous Q12H   Continuous Infusions: . sodium chloride    . heparin 1,000 Units/hr (12/31/16 0600)   PRN Meds: sodium chloride, albuterol, HYDROcodone-acetaminophen, morphine injection, nitroGLYCERIN, sodium chloride flush   Vital Signs    Vitals:   12/31/16 0423 12/31/16 0434 12/31/16 0500 12/31/16 0600  BP:    (!) 155/52  Pulse:   (!) 55 67  Resp:   (!) 21 (!) 22  Temp: 97.7 F (36.5 C)     TempSrc: Oral     SpO2:   96% 96%  Weight:  180 lb 12.4 oz (82 kg)    Height:        Intake/Output Summary (Last 24 hours) at 12/31/16 0738 Last data filed at 12/31/16 0600  Gross per 24 hour  Intake              780 ml  Output                0 ml  Net              780 ml   Filed Weights   12/28/16 1714 12/28/16 2000 12/31/16 0434  Weight: 145 lb (65.8 kg) 182 lb 12.2 oz (82.9 kg) 180 lb 12.4 oz (82 kg)    Telemetry    NSR, HR in low-50's to 60's.  - Personally Reviewed  ECG    NSR, HR 76, with anterolateral TWI - Personally Reviewed  Physical Exam   GEN: Elderly female appearing in no acute distress.  Neck: No JVD Cardiac: Regular rhythm, bradycardiac rate, no murmurs, rubs, or gallops.  Respiratory: Respirations regular and unlabored. Clear to auscultation bilaterally. GI: Soft, nontender, non-distended  MS: No edema; No deformity. Neuro:  AAOx3. Psych: Normal affect  Labs     Chemistry Recent Labs Lab 12/29/16 0142 12/30/16 0328 12/31/16 0323  NA 136 132* 134*  K 4.4 3.4* 4.0  CL 106 102 105  CO2 24 22 21*  GLUCOSE 86 153* 145*  BUN 22* 10 9  CREATININE 0.73 0.58 0.56  CALCIUM 7.7* 7.5* 7.9*  PROT 6.9 6.8 6.4*  ALBUMIN 3.1* 3.2* 3.0*  AST 54* 40 29  ALT 34 34 29  ALKPHOS 46 48 40  BILITOT 0.5 0.3 0.2*  GFRNONAA >60 >60 >60  GFRAA >60 >60 >60  ANIONGAP 6 8 8     Hematology Recent Labs Lab 12/29/16 0142 12/30/16 0328 12/31/16 0323  WBC 12.5* 8.2 6.1  RBC 3.50* 3.57* 3.47*  HGB 9.3* 9.3* 9.2*  HCT 27.3* 27.7* 26.7*  MCV 78.0 77.6* 76.9*  MCH 26.6 26.1 26.5  MCHC 34.1 33.6 34.5  RDW 13.5 13.4 13.5  PLT 208 221 252    Cardiac Enzymes Recent Labs Lab 12/28/16 1709 12/28/16 1903 12/29/16 0052 12/29/16 0713  TROPONINI 4.87*   4.72* 2.61* 1.90*    Recent Labs Lab 12/28/16 1610 12/28/16 1625  TROPIPOC 5.01* 4.16*     BNPNo results for input(s): BNP, PROBNP in the last 168 hours.   DDimer No results for input(s): DDIMER in the last 168 hours.   Radiology    Dg Chest Port 1 View  Result Date: 12/29/2016 CLINICAL DATA:  Pneumonia EXAM: PORTABLE CHEST 1 VIEW COMPARISON:  12/29/2016 FINDINGS: Stable enlarged to cardiac silhouette. Interval improvement in central venous congestion. No pneumothorax. IMPRESSION: Cardiomegaly with improved central venous congestion. Electronically Signed   By: Stewart  Edmunds M.D.   On: 12/29/2016 18:59    Cardiac Studies   Echocardiogram: 12/29/2016 Study Conclusions  - Left ventricle: The cavity size was normal. Wall thickness was   increased in a pattern of mild LVH. Systolic function was normal.   The estimated ejection fraction was in the range of 55% to 60%.   There is hypokinesis of the apicalinferior and apical myocardium.   Doppler parameters are consistent with abnormal left ventricular   relaxation (grade 1 diastolic dysfunction). - Aortic valve: Mildly calcified annulus.  Trileaflet; mildly   calcified leaflets. Mean gradient (S): 13 mm Hg. Peak gradient   (S): 21 mm Hg. VTI ratio of LVOT to aortic valve: 0.56. - Mitral valve: Calcified annulus. There was trivial regurgitation. - Right ventricle: The cavity size was mildly dilated. Systolic   function was reduced. - Right atrium: Central venous pressure (est): 15 mm Hg. - Tricuspid valve: There was trivial regurgitation. - Pulmonary arteries: PA peak pressure: 35 mm Hg (S). - Pericardium, extracardiac: There was no pericardial effusion.  Impressions:  - Mild LVH with LVEF 55-60%. There is hypokinesis of the apical   inferior and apical myocardium. Grade 1 diastolic dysfunction.   Calcified mitral annulus with trivial mitral regurgitation.   Moderately sclerotic aortic valve without obvious stenosis.   Limited views of the right ventricle suggest at least mild   dilatation and significantly reduced contraction. Trivial   tricuspid regurgitation with PASP 35 mmHg and estimated elevated   CVP.  Patient Profile     75 y.o. female w/ PMH of mild aortic stenosis, hypertension, hyperlipidemia, here with NSTEMI in the setting on community-acquired pneumonia and UTI.  Assessment & Plan    1. NSTEMI - Troponin peaked at 4.87.  She denies chest pain but does have R arm pain. EKG shows inferior Q waves and anterolateral T wave inversions, both of which are new since her last EKG in 2014.  - has remained on Heparin. Continue ASA and statin. Not on BB secondary to bradycardia.  - for cardiac catheterization later today with Dr. Varanasi.    2. Paroxysmal atrial fibrillation - Rates in atrial fibrillation are well-controlled. - This patients CHA2DS2-VASc Score and unadjusted Ischemic Stroke Rate (% per year) is equal to 7.2 % stroke rate/year from a score of 5 (HTN, DM, Female, Age (2)). Currently on Heparin. Plan to start NOAC following cardiac catheterization.   3. Hypertension - BP has been 119/39 -  156/95 in the past 24 hours. - plan to resume home Valsartan 80mg daily following cath.    4. Hyperlipidemia - on Lovastatin as an outpatient.  Pravastatin was substituted on admission.  - FLP results are pending. If she has significant CAD on cath, we will likely switch this to atorvastatin or rosuvastatin.    5. CAP/ UTI - per admitting team  Signed, Donatella Walski M Phallon Haydu, PA  12/31/2016, 7:38 AM    

## 2016-12-31 NOTE — Progress Notes (Signed)
Patient communicated through interpretor on admission to unit. Assessment completed and plan of care reviewed with patient. Granddaughter or daughter will be with patient to interpret tonight. Questions answered and patient verbalized understanding per interpretor. Interpretor stayed with patient until granddaughter returned to be with patient.

## 2016-12-31 NOTE — Progress Notes (Signed)
PROGRESS NOTE    Amy Hood  AVW:[id number]  DOB: February 22, 1941  DOA: 12/28/2016 PCP: Georgann Housekeeper, MD Outpatient Specialists:   Hospital course: Amy Hood is a 76 y.o. vietnamese speaking female with complex PMH including long-standing diabetes mellitus with microvascular complications, dyslipidemia, and hypertension who was brought to ED by family who was concerned about an acute decline in patient's condition. Family reports that over past 2-3 days she has been staying in bed, too weak to ambulate or do daily activities. She has had decreased appetite and decreased by mouth intake. They states that she has been complaining of feeling chills and feeling cold. Pt was found to be febrile with pneumonia seen on chest xray and infected urine was seen.  In addition, patient noted to have elevated troponin I.  She was started on IV heparin infusion and IV antibiotics and admission was requested.   Assessment & Plan:   1. NSTEMI - Admitted to SDU for IV heparin infusion. Cardiology has been consulted. Lipids ok. Follow telemetry. ASA, Morphine, NTG, Oxygen ordered. Cardiology planning for cath at [facility] [place] today.  2. Paroxysmal Atrial Fibrillation with slow ventricular response - seen on EKG, repeat EKG this morning. monitor on telemetry, IV heparin infusion.  Further mgmt per cardiology service. 3. Diabetes mellitus type 2, insulin requiring with complications- Follow and adjust, home meds not known at this time, treating with subcut insulin sliding scale in hospital. A1c 6.3.  4. Hyponatremia - secondary to hyperglycemia and dehydration, much improved.  5. E coli UTI - Treating with IV ceftriaxone 2 gm, follow urine culture and sensitivities. 6. E coli bacteremia - 1/2 blood culture positive, started on high dose ceftriaxone IV.  Repeat cultures ordered 1/23.   7. CAP - Treating with IV ceftriaxone and azithromycin day 3/5, follow cultures, oxygen support.  Nebs ordered for wheezing. CXR  looks better.  8. Essential Hypertension - following.  Adjust therapies as needed.  9. Thrombocytopenia - platelets have normalized, likely it was reactive.  10. Generalized weakness - multifactorial causes noted above, PT eval: no PT follow up recommended.   11. Loose stools -  c diff testing negative.   12. Right shoulder pain - likely osteoarthritis - will schedule tylenol.  13. Hard of Hearing and Non-English speaking - Communication will be difficult with patient but will make every effort.  Likely will have to try written communication if the patient can read and write which family not able to tell me at this time.  Tried to use interpreter through phone but patient unable to hear and interpreter unable to get through to patient.      DVT Prophylaxis: IV heparin infusion Code Status: full  Family Communication: bedside  Disposition Plan: TBD   Subjective: Pt had complained of right shoulder pain  Objective: Vitals:   12/31/16 0423 12/31/16 0434 12/31/16 0500 12/31/16 0600  BP:    (!) 155/52  Pulse:   (!) 55 67  Resp:   (!) 21 (!) 22  Temp: 97.7 F (36.5 C)     TempSrc: Oral     SpO2:   96% 96%  Weight:  82 kg (180 lb 12.4 oz)    Height:        Intake/Output Summary (Last 24 hours) at 12/31/16 8295 Last data filed at 12/31/16 0600  Gross per 24 hour  Intake              780 ml  Output  0 ml  Net              780 ml   Filed Weights   12/28/16 1714 12/28/16 2000 12/31/16 0434  Weight: 65.8 kg (145 lb) 82.9 kg (182 lb 12.2 oz) 82 kg (180 lb 12.4 oz)    Exam:  General exam: Pt is Hard of hearing.  Moderately built and nourished patient, lying comfortably supine on the gurney in no obvious distress. Respiratory system: Clear. No increased work of breathing. Cardiovascular system: S1 & S2 heard, RRR.  Gastrointestinal system: Abdomen is nondistended, soft and nontender. Normal bowel sounds heard. Central nervous system: Alert and oriented. No focal  neurological deficits. Extremities: osteoarthritic changes diffuse, full ROM upper and lower exts. no cyanosis.  Data Reviewed: Basic Metabolic Panel:  Recent Labs Lab 12/28/16 1510 12/29/16 0142 12/30/16 0328 12/31/16 0323  NA 130* 136 132* 134*  K 4.1 4.4 3.4* 4.0  CL 99* 106 102 105  CO2 22 24 22  21*  GLUCOSE 168* 86 153* 145*  BUN 26* 22* 10 9  CREATININE 0.95 0.73 0.58 0.56  CALCIUM 7.9* 7.7* 7.5* 7.9*   Liver Function Tests:  Recent Labs Lab 12/28/16 1510 12/29/16 0142 12/30/16 0328 12/31/16 0323  AST 58* 54* 40 29  ALT 35 34 34 29  ALKPHOS 57 46 48 40  BILITOT 0.5 0.5 0.3 0.2*  PROT 7.1 6.9 6.8 6.4*  ALBUMIN 3.6 3.1* 3.2* 3.0*   No results for input(s): LIPASE, AMYLASE in the last 168 hours. No results for input(s): AMMONIA in the last 168 hours. CBC:  Recent Labs Lab 12/28/16 1510 12/29/16 0142 12/30/16 0328 12/31/16 0323  WBC 11.3* 12.5* 8.2 6.1  NEUTROABS 10.4*  --   --   --   HGB 16.9* 9.3* 9.3* 9.2*  HCT 47.6* 27.3* 27.7* 26.7*  MCV 77.7* 78.0 77.6* 76.9*  PLT 128* 208 221 252   Cardiac Enzymes:  Recent Labs Lab 12/28/16 1709 12/28/16 1903 12/29/16 0052 12/29/16 0713  TROPONINI 4.87* 4.72* 2.61* 1.90*   CBG (last 3)   Recent Labs  12/30/16 1247 12/30/16 2112 12/31/16 0420  GLUCAP 170* 191* 135*   Recent Results (from the past 240 hour(s))  Culture, blood (Routine x 2)     Status: None (Preliminary result)   Collection Time: 12/28/16  3:10 PM  Result Value Ref Range Status   Specimen Description BLOOD LEFT ARM  Final   Special Requests BOTTLES DRAWN AEROBIC AND ANAEROBIC 5ML  Final   Culture  Setup Time   Final    GRAM NEGATIVE RODS AEROBIC BOTTLE ONLY Organism ID to follow CRITICAL RESULT CALLED TO, READ BACK BY AND VERIFIED WITH: JULIAN GRIMSLEY,PHARMD @0153  12/31/16 MKELLY,MLT    Culture   Final    NO GROWTH 1 DAY Performed at [facility], [address] [place], [place] [address]    Report Status PENDING   Incomplete  Blood Culture ID Panel (Reflexed)     Status: Abnormal   Collection Time: 12/28/16  3:10 PM  Result Value Ref Range Status   Enterococcus species NOT DETECTED NOT DETECTED Final   Listeria monocytogenes NOT DETECTED NOT DETECTED Final   Staphylococcus species NOT DETECTED NOT DETECTED Final   Staphylococcus aureus NOT DETECTED NOT DETECTED Final   Streptococcus species NOT DETECTED NOT DETECTED Final   Streptococcus agalactiae NOT DETECTED NOT DETECTED Final   Streptococcus pneumoniae NOT DETECTED NOT DETECTED Final   Streptococcus pyogenes NOT DETECTED NOT DETECTED Final   Acinetobacter  baumannii NOT DETECTED NOT DETECTED Final   Enterobacteriaceae species DETECTED (A) NOT DETECTED Final    Comment: CRITICAL RESULT CALLED TO, READ BACK BY AND VERIFIED WITH: JULIAN GRIMSLEY,PHARMD @0153  12/31/16 MKELLY,MLT    Enterobacter cloacae complex NOT DETECTED NOT DETECTED Final   Escherichia coli DETECTED (A) NOT DETECTED Final    Comment: CRITICAL RESULT CALLED TO, READ BACK BY AND VERIFIED WITH: JULIAN GRIMSLEY,PHARMD @0153  12/31/16 MKELLY,MLT    Klebsiella oxytoca NOT DETECTED NOT DETECTED Final   Klebsiella pneumoniae NOT DETECTED NOT DETECTED Final   Proteus species NOT DETECTED NOT DETECTED Final   Serratia marcescens NOT DETECTED NOT DETECTED Final   Carbapenem resistance NOT DETECTED NOT DETECTED Final   Haemophilus influenzae NOT DETECTED NOT DETECTED Final   Neisseria meningitidis NOT DETECTED NOT DETECTED Final   Pseudomonas aeruginosa NOT DETECTED NOT DETECTED Final   Candida albicans NOT DETECTED NOT DETECTED Final   Candida glabrata NOT DETECTED NOT DETECTED Final   Candida krusei NOT DETECTED NOT DETECTED Final   Candida parapsilosis NOT DETECTED NOT DETECTED Final   Candida tropicalis NOT DETECTED NOT DETECTED Final  Urine culture     Status: Abnormal   Collection Time: 12/28/16  4:07 PM  Result Value Ref Range Status   Specimen Description URINE, RANDOM   Final   Special Requests NONE  Final   Culture >=100,000 COLONIES/mL ESCHERICHIA COLI (A)  Final   Report Status 12/30/2016 FINAL  Final   Organism ID, Bacteria ESCHERICHIA COLI (A)  Final      Susceptibility   Escherichia coli - MIC*    AMPICILLIN >=32 RESISTANT Resistant     CEFAZOLIN 16 SENSITIVE Sensitive     CEFTRIAXONE <=1 SENSITIVE Sensitive     CIPROFLOXACIN <=0.25 SENSITIVE Sensitive     GENTAMICIN >=16 RESISTANT Resistant     IMIPENEM <=0.25 SENSITIVE Sensitive     NITROFURANTOIN <=16 SENSITIVE Sensitive     TRIMETH/SULFA >=320 RESISTANT Resistant     AMPICILLIN/SULBACTAM >=32 RESISTANT Resistant     PIP/TAZO 8 SENSITIVE Sensitive     Extended ESBL NEGATIVE Sensitive     * >=100,000 COLONIES/mL ESCHERICHIA COLI  Culture, blood (Routine x 2)     Status: None (Preliminary result)   Collection Time: 12/28/16  4:15 PM  Result Value Ref Range Status   Specimen Description BLOOD BLOOD RIGHT FOREARM  Final   Special Requests IN PEDIATRIC BOTTLE 1ml  Final   Culture   Final    NO GROWTH 1 DAY Performed at [facility], [address] [place], [place] [address]    Report Status PENDING  Incomplete  MRSA PCR Screening     Status: None   Collection Time: 12/28/16  6:48 PM  Result Value Ref Range Status   MRSA by PCR NEGATIVE NEGATIVE Final    Comment:        The GeneXpert MRSA Assay (FDA approved for NASAL specimens only), is one component of a comprehensive MRSA colonization surveillance program. It is not intended to diagnose MRSA infection nor to guide or monitor treatment for MRSA infections.   C difficile quick scan w PCR reflex     Status: None   Collection Time: 12/30/16 11:43 AM  Result Value Ref Range Status   C Diff antigen NEGATIVE NEGATIVE Final   C Diff toxin NEGATIVE NEGATIVE Final   C Diff interpretation No C. difficile detected.  Final     Studies: Dg Chest Port 1 View  Result Date: 12/29/2016 CLINICAL DATA:  Pneumonia EXAM: PORTABLE CHEST  1 VIEW COMPARISON:  12/29/2016 FINDINGS: Stable enlarged to cardiac silhouette. Interval improvement in central venous congestion. No pneumothorax. IMPRESSION: Cardiomegaly with improved central venous congestion. Electronically Signed   By: Genevive Bi M.D.   On: 12/29/2016 18:59   Scheduled Meds: . acetaminophen  650 mg Oral Q6H  . aspirin  81 mg Oral Pre-Cath  . aspirin EC  81 mg Oral Daily  . azithromycin  500 mg Oral Q24H  . cefTRIAXone (ROCEPHIN)  IV  2 g Intravenous Q24H  . insulin aspart  0-15 Units Subcutaneous TID WC  . insulin aspart  3 Units Subcutaneous TID WC  . pravastatin  40 mg Oral q1800  . sodium chloride flush  3 mL Intravenous Q12H   Continuous Infusions: . sodium chloride    . heparin 1,000 Units/hr (12/31/16 0600)    Principal Problem:   NSTEMI (non-ST elevated myocardial infarction) (HCC) Active Problems:   CAP (community acquired pneumonia)   Non-English speaking patient   Atrial fibrillation with slow ventricular response (HCC)   Hyperlipidemia   Cataracts, bilateral   Hypertension   Arthritis   Fever   Leukocytosis   Hyponatremia   UTI (urinary tract infection)   Elevated troponin   CAD (coronary artery disease)   Abnormal ECG   Generalized weakness   Thrombocytopenia (HCC)   Hard of hearing   Acute cystitis without hematuria   Mild aortic stenosis  Critical Care Time spent: 31 mins  Standley Dakins, MD, FAAFP [facility] Pager [phone]  If 7PM-7AM, please contact night-coverage [email or web address] Password [email or web address] 12/31/2016, 7:22 AM    LOS: 3 days

## 2016-12-31 NOTE — Care Management Note (Signed)
Case Management Note  Patient Details  Name: Amy Hood MRN: [id number] Date of Birth: Mar 10, 1941  Subjective/Objective:    S/p coronary stent intervention, will be on plavix, per pt/ot eval no f/u needed, NCM will cont to follow for dc needs.                Action/Plan:   Expected Discharge Date:   (unknown)               Expected Discharge Plan:  Home/Self Care  In-House Referral:     Discharge planning Services  CM Consult  Post Acute Care Choice:    Choice offered to:     DME Arranged:    DME Agency:     HH Arranged:    HH Agency:     Status of Service:  In process, will continue to follow  If discussed at Long Length of Stay Meetings, dates discussed:    Additional Comments:  [name], Shaun Runyon Clinton, RN 12/31/2016, 7:16 PM

## 2016-12-31 NOTE — Progress Notes (Signed)
PHARMACY - PHYSICIAN COMMUNICATION CRITICAL VALUE ALERT - BLOOD CULTURE IDENTIFICATION (BCID)  Results for orders placed or performed during the hospital encounter of 12/28/16  Blood Culture ID Panel (Reflexed) (Collected: 12/28/2016  3:10 PM)  Result Value Ref Range   Enterococcus species NOT DETECTED NOT DETECTED   Listeria monocytogenes NOT DETECTED NOT DETECTED   Staphylococcus species NOT DETECTED NOT DETECTED   Staphylococcus aureus NOT DETECTED NOT DETECTED   Streptococcus species NOT DETECTED NOT DETECTED   Streptococcus agalactiae NOT DETECTED NOT DETECTED   Streptococcus pneumoniae NOT DETECTED NOT DETECTED   Streptococcus pyogenes NOT DETECTED NOT DETECTED   Acinetobacter baumannii NOT DETECTED NOT DETECTED   Enterobacteriaceae species DETECTED (A) NOT DETECTED   Enterobacter cloacae complex NOT DETECTED NOT DETECTED   Escherichia coli DETECTED (A) NOT DETECTED   Klebsiella oxytoca NOT DETECTED NOT DETECTED   Klebsiella pneumoniae NOT DETECTED NOT DETECTED   Proteus species NOT DETECTED NOT DETECTED   Serratia marcescens NOT DETECTED NOT DETECTED   Carbapenem resistance NOT DETECTED NOT DETECTED   Haemophilus influenzae NOT DETECTED NOT DETECTED   Neisseria meningitidis NOT DETECTED NOT DETECTED   Pseudomonas aeruginosa NOT DETECTED NOT DETECTED   Candida albicans NOT DETECTED NOT DETECTED   Candida glabrata NOT DETECTED NOT DETECTED   Candida krusei NOT DETECTED NOT DETECTED   Candida parapsilosis NOT DETECTED NOT DETECTED   Candida tropicalis NOT DETECTED NOT DETECTED    Name of physician (or Provider) Contacted: Leda GauzeKaren J Kirby-Graham, NP  Changes to prescribed antibiotics required: Change Ceftriaxone to 2gm iv q24hr  Aleene DavidsonGrimsley Jr, Greenlee Ancheta Crowford 12/31/2016  1:56 AM

## 2016-12-31 NOTE — Progress Notes (Signed)
Progress Note  Patient Name: Amy Hood Date of Encounter: 12/31/2016  Primary Cardiologist: Dr. Diona BrownerMcDowell  Subjective   Daughter at the bedside. For cath later today. No chest pain overnight.   Inpatient Medications    Scheduled Meds: . acetaminophen  650 mg Oral Q6H  . aspirin  81 mg Oral Pre-Cath  . aspirin EC  81 mg Oral Daily  . azithromycin  500 mg Oral Q24H  . cefTRIAXone (ROCEPHIN)  IV  2 g Intravenous Q24H  . insulin aspart  0-15 Units Subcutaneous TID WC  . insulin aspart  3 Units Subcutaneous TID WC  . pravastatin  40 mg Oral q1800  . sodium chloride flush  3 mL Intravenous Q12H   Continuous Infusions: . sodium chloride    . heparin 1,000 Units/hr (12/31/16 0600)   PRN Meds: sodium chloride, albuterol, HYDROcodone-acetaminophen, morphine injection, nitroGLYCERIN, sodium chloride flush   Vital Signs    Vitals:   12/31/16 0423 12/31/16 0434 12/31/16 0500 12/31/16 0600  BP:    (!) 155/52  Pulse:   (!) 55 67  Resp:   (!) 21 (!) 22  Temp: 97.7 F (36.5 C)     TempSrc: Oral     SpO2:   96% 96%  Weight:  180 lb 12.4 oz (82 kg)    Height:        Intake/Output Summary (Last 24 hours) at 12/31/16 0738 Last data filed at 12/31/16 0600  Gross per 24 hour  Intake              780 ml  Output                0 ml  Net              780 ml   Filed Weights   12/28/16 1714 12/28/16 2000 12/31/16 0434  Weight: 145 lb (65.8 kg) 182 lb 12.2 oz (82.9 kg) 180 lb 12.4 oz (82 kg)    Telemetry    NSR, HR in low-50's to 60's.  - Personally Reviewed  ECG    NSR, HR 76, with anterolateral TWI - Personally Reviewed  Physical Exam   GEN: Elderly female appearing in no acute distress.  Neck: No JVD Cardiac: Regular rhythm, bradycardiac rate, no murmurs, rubs, or gallops.  Respiratory: Respirations regular and unlabored. Clear to auscultation bilaterally. GI: Soft, nontender, non-distended  MS: No edema; No deformity. Neuro:  AAOx3. Psych: Normal affect  Labs     Chemistry Recent Labs Lab 12/29/16 0142 12/30/16 0328 12/31/16 0323  NA 136 132* 134*  K 4.4 3.4* 4.0  CL 106 102 105  CO2 24 22 21*  GLUCOSE 86 153* 145*  BUN 22* 10 9  CREATININE 0.73 0.58 0.56  CALCIUM 7.7* 7.5* 7.9*  PROT 6.9 6.8 6.4*  ALBUMIN 3.1* 3.2* 3.0*  AST 54* 40 29  ALT 34 34 29  ALKPHOS 46 48 40  BILITOT 0.5 0.3 0.2*  GFRNONAA >60 >60 >60  GFRAA >60 >60 >60  ANIONGAP 6 8 8      Hematology Recent Labs Lab 12/29/16 0142 12/30/16 0328 12/31/16 0323  WBC 12.5* 8.2 6.1  RBC 3.50* 3.57* 3.47*  HGB 9.3* 9.3* 9.2*  HCT 27.3* 27.7* 26.7*  MCV 78.0 77.6* 76.9*  MCH 26.6 26.1 26.5  MCHC 34.1 33.6 34.5  RDW 13.5 13.4 13.5  PLT 208 221 252    Cardiac Enzymes Recent Labs Lab 12/28/16 1709 12/28/16 1903 12/29/16 0052 12/29/16 0713  TROPONINI 4.87*  4.72* 2.61* 1.90*    Recent Labs Lab 12/28/16 1610 12/28/16 1625  TROPIPOC 5.01* 4.16*     BNPNo results for input(s): BNP, PROBNP in the last 168 hours.   DDimer No results for input(s): DDIMER in the last 168 hours.   Radiology    Dg Chest Port 1 View  Result Date: 12/29/2016 CLINICAL DATA:  Pneumonia EXAM: PORTABLE CHEST 1 VIEW COMPARISON:  12/29/2016 FINDINGS: Stable enlarged to cardiac silhouette. Interval improvement in central venous congestion. No pneumothorax. IMPRESSION: Cardiomegaly with improved central venous congestion. Electronically Signed   By: Genevive Bi M.D.   On: 12/29/2016 18:59    Cardiac Studies   Echocardiogram: 12/29/2016 Study Conclusions  - Left ventricle: The cavity size was normal. Wall thickness was   increased in a pattern of mild LVH. Systolic function was normal.   The estimated ejection fraction was in the range of 55% to 60%.   There is hypokinesis of the apicalinferior and apical myocardium.   Doppler parameters are consistent with abnormal left ventricular   relaxation (grade 1 diastolic dysfunction). - Aortic valve: Mildly calcified annulus.  Trileaflet; mildly   calcified leaflets. Mean gradient (S): 13 mm Hg. Peak gradient   (S): 21 mm Hg. VTI ratio of LVOT to aortic valve: 0.56. - Mitral valve: Calcified annulus. There was trivial regurgitation. - Right ventricle: The cavity size was mildly dilated. Systolic   function was reduced. - Right atrium: Central venous pressure (est): 15 mm Hg. - Tricuspid valve: There was trivial regurgitation. - Pulmonary arteries: PA peak pressure: 35 mm Hg (S). - Pericardium, extracardiac: There was no pericardial effusion.  Impressions:  - Mild LVH with LVEF 55-60%. There is hypokinesis of the apical   inferior and apical myocardium. Grade 1 diastolic dysfunction.   Calcified mitral annulus with trivial mitral regurgitation.   Moderately sclerotic aortic valve without obvious stenosis.   Limited views of the right ventricle suggest at least mild   dilatation and significantly reduced contraction. Trivial   tricuspid regurgitation with PASP 35 mmHg and estimated elevated   CVP.  Patient Profile     76 y.o. female w/ PMH of mild aortic stenosis, hypertension, hyperlipidemia, here with NSTEMI in the setting on community-acquired pneumonia and UTI.  Assessment & Plan    1. NSTEMI - Troponin peaked at 4.87.  She denies chest pain but does have R arm pain. EKG shows inferior Q waves and anterolateral T wave inversions, both of which are new since her last EKG in 2014.  - has remained on Heparin. Continue ASA and statin. Not on BB secondary to bradycardia.  - for cardiac catheterization later today with Dr. Eldridge Dace.    2. Paroxysmal atrial fibrillation - Rates in atrial fibrillation are well-controlled. - This patients CHA2DS2-VASc Score and unadjusted Ischemic Stroke Rate (% per year) is equal to 7.2 % stroke rate/year from a score of 5 (HTN, DM, Female, Age (2)). Currently on Heparin. Plan to start NOAC following cardiac catheterization.   3. Hypertension - BP has been 119/39 -  156/95 in the past 24 hours. - plan to resume home Valsartan 80mg  daily following cath.    4. Hyperlipidemia - on Lovastatin as an outpatient.  Pravastatin was substituted on admission.  - FLP results are pending. If she has significant CAD on cath, we will likely switch this to atorvastatin or rosuvastatin.    5. CAP/ UTI - per admitting team  Signed, Ellsworth Lennox, PA  12/31/2016, 7:38 AM

## 2016-12-31 NOTE — Progress Notes (Signed)
ANTICOAGULATION CONSULT NOTE   Pharmacy Consult for heparin Indication: chest pain/ACS  No Known Allergies  Patient Measurements: Height: 5' (152.4 cm) Weight: 180 lb 12.4 oz (82 kg) IBW/kg (Calculated) : 45.5 Heparin Dosing Weight: 60 kg  Vital Signs: Temp: 97.7 F (36.5 C) (01/23 0423) Temp Source: Oral (01/23 0423) BP: 155/52 (01/23 0600) Pulse Rate: 67 (01/23 0600)  Labs:  Recent Labs  12/28/16 1510  12/28/16 1903 12/29/16 0052 12/29/16 0142 12/29/16 0713  12/30/16 0328 12/30/16 0758 12/30/16 1603 12/31/16 0323  HGB 16.9*  --   --   --  9.3*  --   --  9.3*  --   --  9.2*  HCT 47.6*  --   --   --  27.3*  --   --  27.7*  --   --  26.7*  PLT 128*  --   --   --  208  --   --  221  --   --  252  LABPROT 14.4  --   --   --   --   --   --   --   --   --  13.6  INR 1.12  --   --   --   --   --   --   --   --   --  1.04  HEPARINUNFRC  --   --   --   --  0.20*  --   < >  --  0.38 0.43 0.30  CREATININE 0.95  --   --   --  0.73  --   --  0.58  --   --  0.56  TROPONINI  --   < > 4.72* 2.61*  --  1.90*  --   --   --   --   --   < > = values in this interval not displayed.  Estimated Creatinine Clearance: 55.9 mL/min (by C-G formula based on SCr of 0.56 mg/dL).   Medical History: Past Medical History:  Diagnosis Date  . Arthritis   . Cataracts, bilateral   . Essential hypertension   . Hyperlipidemia   . Mild aortic stenosis 12/30/2016  . Type 2 diabetes mellitus (HCC)     Assessment: [age] yoF presented to the ED on 12/28/16 with c/o decreased appetite and fluid intake. Troponin was found to be elevated at 4.16.  Heparin infusion for NSTEMI.  Patient also with paroxysmal atrial fibrillation with CHADS-VASc score of 5.  Today, 12/31/2016:  Heparin level 0.30, therapeutic with infusion at 1000 units/hr  CBC: Hgb low but stable, platelets WNL/stable  No reported bleeding issues  On ASA. Cards following and planning for cardiac cath today.  Goal of Therapy:  Heparin  level 0.3-0.7 units/ml Monitor platelets by anticoagulation protocol: Yes   Plan:   Continue heparin infusion at 1000 units/hr  Daily heparin level and CBC while on heparin infusion  Clance BollAmanda Litzy Dicker, PharmD, BCPS Pager: [phone] 12/31/2016 8:23 AM

## 2017-01-01 ENCOUNTER — Encounter (HOSPITAL_COMMUNITY): Payer: Self-pay | Admitting: Interventional Cardiology

## 2017-01-01 ENCOUNTER — Inpatient Hospital Stay (HOSPITAL_COMMUNITY): Payer: Medicare Other

## 2017-01-01 DIAGNOSIS — Z955 Presence of coronary angioplasty implant and graft: Secondary | ICD-10-CM

## 2017-01-01 DIAGNOSIS — I119 Hypertensive heart disease without heart failure: Secondary | ICD-10-CM

## 2017-01-01 LAB — BASIC METABOLIC PANEL
Anion gap: 9 (ref 5–15)
BUN: 6 mg/dL (ref 6–20)
CALCIUM: 7.9 mg/dL — AB (ref 8.9–10.3)
CO2: 20 mmol/L — AB (ref 22–32)
CREATININE: 0.58 mg/dL (ref 0.44–1.00)
Chloride: 107 mmol/L (ref 101–111)
GFR calc non Af Amer: >60 mL/min (ref >60)
GLUCOSE: 145 mg/dL — AB (ref 65–99)
Potassium: 3.6 mmol/L (ref 3.5–5.1)
Sodium: 136 mmol/L (ref 135–145)

## 2017-01-01 LAB — GLUCOSE, CAPILLARY
GLUCOSE-CAPILLARY: 140 mg/dL — AB (ref 65–99)
GLUCOSE-CAPILLARY: 118 mg/dL — AB (ref 65–99)
Glucose-Capillary: 108 mg/dL — ABNORMAL HIGH (ref 65–99)
Glucose-Capillary: 162 mg/dL — ABNORMAL HIGH (ref 65–99)
Glucose-Capillary: 145 mg/dL — ABNORMAL HIGH (ref 65–99)

## 2017-01-01 LAB — CBC
HEMATOCRIT: 26.6 % — AB (ref 36.0–46.0)
Hemoglobin: 8.8 g/dL — ABNORMAL LOW (ref 12.0–15.0)
MCH: 25.9 pg — ABNORMAL LOW (ref 26.0–34.0)
MCHC: 33.1 g/dL (ref 30.0–36.0)
MCV: 78.2 fL (ref 78.0–100.0)
Platelets: 277 10*3/uL (ref 150–400)
RBC: 3.4 MIL/uL — ABNORMAL LOW (ref 3.87–5.11)
RDW: 13.4 % (ref 11.5–15.5)
WBC: 6.8 10*3/uL (ref 4.0–10.5)

## 2017-01-01 LAB — URIC ACID: Uric Acid, Serum: 3.2 mg/dL (ref 2.3–6.6)

## 2017-01-01 LAB — POCT ACTIVATED CLOTTING TIME: Activated Clotting Time: 257 seconds

## 2017-01-01 MED ORDER — CARVEDILOL 3.125 MG PO TABS
3.1250 mg | ORAL_TABLET | Freq: Two times a day (BID) | ORAL | Status: DC
  Administered 2017-01-01 (×2): 3.125 mg via ORAL
  Filled 2017-01-01 (×2): qty 1

## 2017-01-01 MED ORDER — ROSUVASTATIN CALCIUM 20 MG PO TABS
20.0000 mg | ORAL_TABLET | Freq: Every day | ORAL | Status: DC
  Administered 2017-01-01 – 2017-01-02 (×2): 20 mg via ORAL
  Filled 2017-01-01 (×2): qty 1

## 2017-01-01 MED ORDER — HYDROCODONE-ACETAMINOPHEN 5-325 MG PO TABS
1.0000 | ORAL_TABLET | Freq: Four times a day (QID) | ORAL | Status: DC | PRN
  Administered 2017-01-01: 1 via ORAL
  Administered 2017-01-02: 2 via ORAL
  Filled 2017-01-01: qty 2
  Filled 2017-01-01: qty 1

## 2017-01-01 MED FILL — Verapamil HCl IV Soln 2.5 MG/ML: INTRAVENOUS | Qty: 2 | Status: AC

## 2017-01-01 NOTE — Care Management Note (Signed)
Case Management Note  Patient Details  Name: Amy Hood MRN: [id number] Date of Birth: May 28, 1941  Subjective/Objective: s/p coronary stent intervention, will be on brilinta now, NCM awaiting benefit check, per pt/ot eval no fu/u needed.  Patient speaks [place], granddaughter states that [facility] [address] they do not have brilinta in stock, [facility] [address] and [facility] do [facility] in [facility], she will need to [facility] [address] to get 30 day free .  Per granddaughter patient has a rolling walker at home.  NCM  Left 30 day savings card with patient.                    Action/Plan:   Expected Discharge Date:   (unknown)               Expected Discharge Plan:  Home/Self Care  In-House Referral:     Discharge planning Services  CM Consult  Post Acute Care Choice:    Choice offered to:     DME Arranged:    DME Agency:     HH Arranged:    HH Agency:     Status of Service:  Completed, signed off  If discussed at [employer] Length of Stay Meetings, dates discussed:    Additional Comments:  [name], [name], RN 01/01/2017, 10:33 AM

## 2017-01-01 NOTE — Progress Notes (Addendum)
[facility]  PROGRESS NOTE  Amy Hood ZOX:[id number] DOB: Aug 18, 1941 DOA: 12/28/2016 PCP: Georgann HousekeeperHUSAIN,KARRAR, MD   Brief HPI:   76 y.o.vietnamese speaking femalewith complex PMH including long-standing diabetes mellitus with microvascular complications, dyslipidemia, and hypertension who was brought to ED by family who was concerned about an acute decline in patient's condition. Family reports that overpast 2-3 days she has been staying in bed, too weak to ambulate or do daily activities. She has had decreased appetite and decreased by mouth intake. They states that she has been complaining of feeling chills and feeling cold. Pt was found to be febrile with pneumonia seen on chest xray and infected urine was seen. In addition, patient noted to have elevated troponin I. She was started on IV heparin infusion and IV antibiotics and admission was requested.     Subjective   Patient complains of right shoulder pain.   Assessment/Plan:     1. Non-STEMI-patient had a negative troponin, underwent cardiac catheterization for blue she should've LAD and left circumflex with 25% ostial lesion. She underwent PCI of the LAD and left circumflex arteries. Continue aspirin and Ticagrelor. 2. Right shoulder pain- patient complains of severe pain in right shoulder, which has been chronic. X-ray of right shoulder obtained today shows changes of arthritis, tiny calcification and discontinue him to head probable calcific tendinosis. We will obtain MRI right shoulder. 3. Community-acquired pneumonia- patient started on ceftriaxone and Zithromax. We'll continue with his antibiotics in the hospital. 4. Escherichia coli bacteremia - blood cultures 1 out of 2 bottles, grew Escherichia coli sensitive to ceftriaxone. We will continue with ceftriaxone. 5. UTI-urine culture growing Escherichia coli. Continue ceftriaxone 6.  Paroxysmal atrial fibrillation- [place] is controlled, not on anticoagulation as patient  is on aspirin and ticagrelor. 7. Diabetes mellitus-continue sliding scale insulin alone. Hemoglobin A1c is 6.3 8. Essential hypertension - continue irbesartan, carvedilol     DVT prophylaxis: SCD's  Code Status: Full code  Family Communication: No family at bedside  Disposition Plan: Home, after completing the workup for right shoulder pain   Consultants:  Cardiology  Procedures:  Cardiac catheterization    Antibiotics:   Anti-infectives    Start     Dose/Rate Route Frequency Ordered Stop   12/31/16 0600  cefTRIAXone (ROCEPHIN) 2 g in dextrose 5 % 50 mL IVPB     2 g 100 mL/hr over 30 Minutes Intravenous Every 24 hours 12/31/16 0408 01/05/17 0559   12/30/16 1700  azithromycin (ZITHROMAX) tablet 500 mg     500 mg Oral Every 24 hours 12/30/16 0918 01/02/17 1659   12/29/16 1700  azithromycin (ZITHROMAX) 500 mg in dextrose 5 % 250 mL IVPB  Status:  Discontinued     500 mg 250 mL/hr over 60 Minutes Intravenous Every 24 hours 12/28/16 1823 12/30/16 0918   12/29/16 1600  cefTRIAXone (ROCEPHIN) 1 g in dextrose 5 % 50 mL IVPB  Status:  Discontinued     1 g 100 mL/hr over 30 Minutes Intravenous Every 24 hours 12/28/16 1823 12/31/16 0408   12/28/16 1630  cefTRIAXone (ROCEPHIN) 2 g in dextrose 5 % 50 mL IVPB     2 g 100 mL/hr over 30 Minutes Intravenous  Once 12/28/16 1621 12/28/16 1710   12/28/16 1630  azithromycin (ZITHROMAX) 500 mg in dextrose 5 % 250 mL IVPB     500 mg 250 mL/hr over 60 Minutes Intravenous  Once 12/28/16 1621 12/28/16 1927       Objective   Vitals:   12/31/16  2030 01/01/17 0242 01/01/17 0800 01/01/17 1100  BP:  (!) 142/46 (!) 142/45 (!) 125/39  Pulse: 79 68 66 65  Resp: (!) 21 (!) 22 20 20   Temp:  98 F (36.7 C) 98.3 F (36.8 C) 98.4 F (36.9 C)  TempSrc:  Oral Oral Oral  SpO2: 96% 96% 97% 96%  Weight:  77.2 kg (170 lb 3.1 oz)    Height:        Intake/Output Summary (Last 24 hours) at 01/01/17 1639 Last data filed at 01/01/17 1610  Gross  per 24 hour  Intake              900 ml  Output                0 ml  Net              900 ml   Filed Weights   12/28/16 2000 12/31/16 0434 01/01/17 0242  Weight: 82.9 kg (182 lb 12.2 oz) 82 kg (180 lb 12.4 oz) 77.2 kg (170 lb 3.1 oz)     Physical Examination:  General exam: Appears calm and comfortable. Respiratory system: Clear to auscultation. Respiratory effort normal. Cardiovascular system:  RRR. No  murmurs, rubs, gallops. No pedal edema. GI system: Abdomen is nondistended, soft and nontender. No organomegaly.  Central nervous system. No focal neurological deficits. 5 x 5 power in all extremities. Skin: No rashes, lesions or ulcers. Psychiatry: Alert, oriented x 3.Judgement and insight appear normal. Affect normal. Musculoskeletal- right shoulder tender to palpation, no limitation of range of motion.    Data Reviewed: I have personally reviewed following labs and imaging studies  CBG:  Recent Labs Lab 12/31/16 1226 12/31/16 1416 12/31/16 2253 01/01/17 0635 01/01/17 1116  GLUCAP 141* 108* 172* 140* 162*    CBC:  Recent Labs Lab 12/28/16 1510 12/29/16 0142 12/30/16 0328 12/31/16 0323 01/01/17 0235  WBC 11.3* 12.5* 8.2 6.1 6.8  NEUTROABS 10.4*  --   --   --   --   HGB 16.9* 9.3* 9.3* 9.2* 8.8*  HCT 47.6* 27.3* 27.7* 26.7* 26.6*  MCV 77.7* 78.0 77.6* 76.9* 78.2  PLT 128* 208 221 252 277    Basic Metabolic Panel:  Recent Labs Lab 12/28/16 1510 12/29/16 0142 12/30/16 0328 12/31/16 0323 01/01/17 0235  NA 130* 136 132* 134* 136  K 4.1 4.4 3.4* 4.0 3.6  CL 99* 106 102 105 107  CO2 22 24 22  21* 20*  GLUCOSE 168* 86 153* 145* 145*  BUN 26* 22* 10 9 6   CREATININE 0.95 0.73 0.58 0.56 0.58  CALCIUM 7.9* 7.7* 7.5* 7.9* 7.9*    Recent Results (from the past 240 hour(s))  Culture, blood (Routine x 2)     Status: Abnormal (Preliminary result)   Collection Time: 12/28/16  3:10 PM  Result Value Ref Range Status   Specimen Description BLOOD LEFT ARM   Final   Special Requests BOTTLES DRAWN AEROBIC AND ANAEROBIC  Final   Culture  Setup Time   Final    GRAM NEGATIVE RODS AEROBIC BOTTLE ONLY CRITICAL RESULT CALLED TO, READ BACK BY AND VERIFIED WITH: JULIAN GRIMSLEY,PHARMD @0153  12/31/16 MKELLY,MLT    Culture (A)  Final    ESCHERICHIA COLI SUSCEPTIBILITIES TO FOLLOW Performed at [facility], [address] [place], [place] [address]    Report Status PENDING  Incomplete  Blood Culture ID Panel (Reflexed)     Status: Abnormal   Collection Time: 12/28/16  3:10 PM  Result Value Ref Range Status   Enterococcus species NOT DETECTED NOT DETECTED Final   Listeria monocytogenes NOT DETECTED NOT DETECTED Final   Staphylococcus species NOT DETECTED NOT DETECTED Final   Staphylococcus aureus NOT DETECTED NOT DETECTED Final   Streptococcus species NOT DETECTED NOT DETECTED Final   Streptococcus agalactiae NOT DETECTED NOT DETECTED Final   Streptococcus pneumoniae NOT DETECTED NOT DETECTED Final   Streptococcus pyogenes NOT DETECTED NOT DETECTED Final   Acinetobacter baumannii NOT DETECTED NOT DETECTED Final   Enterobacteriaceae species DETECTED (A) NOT DETECTED Final    Comment: CRITICAL RESULT CALLED TO, READ BACK BY AND VERIFIED WITH: JULIAN GRIMSLEY,PHARMD @0153  12/31/16 MKELLY,MLT    Enterobacter cloacae complex NOT DETECTED NOT DETECTED Final   Escherichia coli DETECTED (A) NOT DETECTED Final    Comment: CRITICAL RESULT CALLED TO, READ BACK BY AND VERIFIED WITH: JULIAN GRIMSLEY,PHARMD @0153  12/31/16 MKELLY,MLT    Klebsiella oxytoca NOT DETECTED NOT DETECTED Final   Klebsiella pneumoniae NOT DETECTED NOT DETECTED Final   Proteus species NOT DETECTED NOT DETECTED Final   Serratia marcescens NOT DETECTED NOT DETECTED Final   Carbapenem resistance NOT DETECTED NOT DETECTED Final   Haemophilus influenzae NOT DETECTED NOT DETECTED Final   Neisseria meningitidis NOT DETECTED NOT DETECTED Final   Pseudomonas aeruginosa NOT  DETECTED NOT DETECTED Final   Candida albicans NOT DETECTED NOT DETECTED Final   Candida glabrata NOT DETECTED NOT DETECTED Final   Candida krusei NOT DETECTED NOT DETECTED Final   Candida parapsilosis NOT DETECTED NOT DETECTED Final   Candida tropicalis NOT DETECTED NOT DETECTED Final  Urine culture     Status: Abnormal   Collection Time: 12/28/16  4:07 PM  Result Value Ref Range Status   Specimen Description URINE, RANDOM  Final   Special Requests NONE  Final   Culture >=100,000 COLONIES/mL ESCHERICHIA COLI (A)  Final   Report Status 12/30/2016 FINAL  Final   Organism ID, Bacteria ESCHERICHIA COLI (A)  Final      Susceptibility   Escherichia coli - MIC*    AMPICILLIN >=32 RESISTANT Resistant     CEFAZOLIN 16 SENSITIVE Sensitive     CEFTRIAXONE <=1 SENSITIVE Sensitive     CIPROFLOXACIN <=0.25 SENSITIVE Sensitive     GENTAMICIN >=16 RESISTANT Resistant     IMIPENEM <=0.25 SENSITIVE Sensitive     NITROFURANTOIN <=16 SENSITIVE Sensitive     TRIMETH/SULFA >=320 RESISTANT Resistant     AMPICILLIN/SULBACTAM >=32 RESISTANT Resistant     PIP/TAZO 8 SENSITIVE Sensitive     Extended ESBL NEGATIVE Sensitive     * >=100,000 COLONIES/mL ESCHERICHIA COLI  Culture, blood (Routine x 2)     Status: None (Preliminary result)   Collection Time: 12/28/16  4:15 PM  Result Value Ref Range Status   Specimen Description BLOOD BLOOD RIGHT FOREARM  Final   Special Requests IN PEDIATRIC BOTTLE 1ml  Final   Culture   Final    NO GROWTH 3 DAYS Performed at [facility] [facility], [address] [place], [place] [address]    Report Status PENDING  Incomplete  MRSA PCR Screening     Status: None   Collection Time: 12/28/16  6:48 PM  Result Value Ref Range Status   MRSA by PCR NEGATIVE NEGATIVE Final    Comment:        The GeneXpert MRSA Assay (FDA approved for NASAL specimens only), is one component of a comprehensive MRSA colonization surveillance program. It is not intended to diagnose  MRSA infection  nor to guide or monitor treatment for MRSA infections.   C difficile quick scan w PCR reflex     Status: None   Collection Time: 12/30/16 11:43 AM  Result Value Ref Range Status   C Diff antigen NEGATIVE NEGATIVE Final   C Diff toxin NEGATIVE NEGATIVE Final   C Diff interpretation No C. difficile detected.  Final     Liver Function Tests:  Recent Labs Lab 12/28/16 1510 12/29/16 0142 12/30/16 0328 12/31/16 0323  AST 58* 54* 40 29  ALT 35 34 34 29  ALKPHOS 57 46 48 40  BILITOT 0.5 0.5 0.3 0.2*  PROT 7.1 6.9 6.8 6.4*  ALBUMIN 3.6 3.1* 3.2* 3.0*   No results for input(s): LIPASE, AMYLASE in the last 168 hours. No results for input(s): AMMONIA in the last 168 hours.  Cardiac Enzymes:  Recent Labs Lab 12/28/16 1709 12/28/16 1903 12/29/16 0052 12/29/16 0713  TROPONINI 4.87* 4.72* 2.61* 1.90*   BNP (last 3 results) No results for input(s): BNP in the last 8760 hours.  ProBNP (last 3 results) No results for input(s): PROBNP in the last 8760 hours.    Studies: Dg Shoulder Right  Result Date: 01/01/2017 CLINICAL DATA:  Right shoulder generalized pain, no known injury EXAM: RIGHT SHOULDER - 2+ VIEW COMPARISON:  None. FINDINGS: Three views of the right shoulder submitted. No acute fracture or subluxation. Mild degenerative changes AC joint. Mild spurring of acromion. Glenohumeral joint is preserved. Tiny calcification adjacent of the humeral head probable calcific tendinosis. IMPRESSION: No acute fracture or subluxation. Mild degenerative changes AC joint. Mild spurring of acromion. Glenohumeral joint is preserved. Tiny calcification adjacent of the humeral head probable calcific tendinosis. Electronically Signed   By: Natasha Mead M.D.   On: 01/01/2017 10:11    Scheduled Meds: . aspirin EC  81 mg Oral Daily  . azithromycin  500 mg Oral Q24H  . carvedilol  3.125 mg Oral BID WC  . cefTRIAXone (ROCEPHIN)  IV  2 g Intravenous Q24H  . insulin aspart  0-15 Units  Subcutaneous TID WC  . insulin aspart  3 Units Subcutaneous TID WC  . irbesartan  75 mg Oral Daily  . rosuvastatin  20 mg Oral q1800  . sodium chloride flush  3 mL Intravenous Q12H  . ticagrelor  90 mg Oral BID      Time spent: 25 min  [facility] S   [facility] Pager [phone]. If 7PM-7AM, please contact night-coverage at [email or web address], Office  [phone]  password [email or web address] 01/01/2017, 4:39 PM  LOS: 4 days

## 2017-01-01 NOTE — Progress Notes (Signed)
S/W CRYSTAL @ [facility] # [id number]   BRILINTA  90 MG BID   COVER- YES  CO-PAY- $ 3.70  Q/L 2 PER DAY  TIER- 3 DRUG  PRIOR APPROVAL- NO  PHARMACY: [facility]   DEDUCTIBLE NOT MET / PATIENT HAS - L.I.S  MAIL-ORDER FOR 90 DAY SUPPLY  $ 3.70  PATIENT ALSO HAVE MEDICAID CO-PAY ALSO $ 3.70

## 2017-01-01 NOTE — Progress Notes (Signed)
CARDIAC REHAB PHASE I   PRE:  Rate/Rhythm: 65 SR    BP: sitting 100/69    SaO2: 95 RA  Pt has not been OOB, presumably per her request. She c/o right shoulder pain. Discussed MI, stent, Brilinta, with granddaughter who signed translation form and then translated for pt. Granddaughter sts her grandmother follows a "strict" diet given by her PCP for DM. I left diet sheets but they do not have any questions or concerns regarding diet. Pt with fairly flat affect. Reluctant to get up. I was going to walk with her after explaining the importance but then PT came. They can walk with her today and CR will walk with her tomorrow. Set up recliner for her to sit up. [phone]  Harriet Massonandi Kristan Teniya Filter CES, ACSM 01/01/2017 3:11 PM

## 2017-01-01 NOTE — Progress Notes (Signed)
OT Cancellation Note  Patient Details Name: Amy Hood MRN: [id number] DOB: [birth year]   Cancelled Treatment:    Reason Eval/Treat Not Completed: Other (comment) Pt with lunch arriving first attempt. Upon second attempt caridiac rehab present with hour long education. Ot to re attempt next most appropriate time  Felecia ShellingJones, Josiah Nieto B  Marshae Azam, Brynn   OTR/L Pager: [phone] Office: [phone] .  01/01/2017, 2:57 PM

## 2017-01-01 NOTE — Progress Notes (Signed)
Physical Therapy Treatment Patient Details Name: Amy Hood MRN: [id number] DOB: [birth year] Today's Date: 01/01/2017    History of Present Illness 76 y.o. vietnamese speaking female with complex PMH including long-standing diabetes mellitus with microvascular complications, dyslipidemia, and hypertension who was brought to ED by family who was concerned about an acute decline in patient's condition.  Dx NSTEMI, afib, DM2, CAP     PT Comments    Pt slower to encourage OOB, but participates well with mobility on she gets OOB.  Stiff usage of the RW, but generally steady and age appropriate of speed.  Follow Up Recommendations  No PT follow up     Equipment Recommendations  None recommended by PT    Recommendations for Other Services       Precautions / Restrictions Precautions Precautions: Fall    Mobility  Bed Mobility Overal bed mobility: Needs Assistance Bed Mobility: Supine to Sit;Sit to Supine     Supine to sit: Supervision Sit to supine: Supervision   General bed mobility comments: rail used, but otherwise no assist needed  Transfers Overall transfer level: Needs assistance Equipment used: Rolling walker (2 wheeled) Transfers: Sit to/from Stand Sit to Stand: Supervision         General transfer comment: no assist needed  Ambulation/Gait Ambulation/Gait assistance: Supervision Ambulation Distance (Feet): 220 Feet Assistive device: Rolling walker (2 wheeled) Gait Pattern/deviations: Step-to pattern   Gait velocity interpretation: at or above normal speed for age/gender General Gait Details: steady and age appropriate speeds.  Rigid use of the RW, but not sure if she uses this method at home.   Stairs            Wheelchair Mobility    Modified Rankin (Stroke Patients Only)       Balance     Sitting balance-Leahy Scale: Good       Standing balance-Leahy Scale: Poor                      Cognition Arousal/Alertness:  Awake/alert Behavior During Therapy: WFL for tasks assessed/performed Overall Cognitive Status: Within Functional Limits for tasks assessed                      Exercises      General Comments        Pertinent Vitals/Pain Pain Assessment: Faces Faces Pain Scale: No hurt Pain Intervention(s): Monitored during session    Home Living                      Prior Function            PT Goals (current goals can now be found in the care plan section) Acute Rehab PT Goals PT Goal Formulation: With patient/family Time For Goal Achievement: 01/06/17 Potential to Achieve Goals: Good Progress towards PT goals: Progressing toward goals    Frequency    Min 2X/week      PT Plan Current plan remains appropriate    Co-evaluation             End of Session   Activity Tolerance: Patient tolerated treatment well Patient left: in bed;with call bell/phone within reach;with family/visitor present     Time: [id number] PT Time Calculation (min) (ACUTE ONLY): 20 min  Charges:  $Gait Training: 8-22 mins                    G Codes:      Eliseo GumKenneth V  Jamey Demchak 01/01/2017, 3:57 PM 01/01/2017  [place] Bing, PT [phone]  (pager)

## 2017-01-01 NOTE — Progress Notes (Signed)
Progress Note  Patient Name: Amy Hood Date of Encounter: 01/01/2017  Primary Cardiologist: Dr. Diona BrownerMcDowell (new)  Subjective   Complains of R shoulder pain.  She reports that she can't sleep or eat due to the pain.  Denies chest pain or shortness of breath.   Inpatient Medications    Scheduled Meds: . acetaminophen  650 mg Oral Q6H  . aspirin EC  81 mg Oral Daily  . azithromycin  500 mg Oral Q24H  . cefTRIAXone (ROCEPHIN)  IV  2 g Intravenous Q24H  . insulin aspart  0-15 Units Subcutaneous TID WC  . insulin aspart  3 Units Subcutaneous TID WC  . irbesartan  75 mg Oral Daily  . pravastatin  40 mg Oral q1800  . sodium chloride flush  3 mL Intravenous Q12H  . ticagrelor  90 mg Oral BID   Continuous Infusions:  PRN Meds: sodium chloride, acetaminophen, albuterol, HYDROcodone-acetaminophen, morphine injection, nitroGLYCERIN, ondansetron (ZOFRAN) IV, sodium chloride flush   Vital Signs    Vitals:   12/31/16 1931 12/31/16 2000 12/31/16 2030 01/01/17 0242  BP: (!) 162/79   (!) 142/46  Pulse: 73 78 79 68  Resp: (!) 26 (!) 35 (!) 21 (!) 22  Temp: 98.7 F (37.1 C)   98 F (36.7 C)  TempSrc: Oral   Oral  SpO2: 97% 97% 96% 96%  Weight:    77.2 kg (170 lb 3.1 oz)  Height:        Intake/Output Summary (Last 24 hours) at 01/01/17 0812 Last data filed at 01/01/17 [id number]  Gross per 24 hour  Intake              900 ml  Output                0 ml  Net              900 ml   Filed Weights   12/28/16 2000 12/31/16 0434 01/01/17 0242  Weight: 82.9 kg (182 lb 12.2 oz) 82 kg (180 lb 12.4 oz) 77.2 kg (170 lb 3.1 oz)    Telemetry    Sinus rhythm and atrial fibrillation - Personally Reviewed  ECG    12/30/16: Sinus rhythm.  Rate 76 bpm.  Prior inferior infarct.  Anterolateral TWI.   - Personally Reviewed  Physical Exam   GEN: No acute distress.  Neck: No JVD Cardiac: RRR.  No murmurs, rubs, or gallops.  Respiratory: Scattered rhonchi bilaterally  GI: Soft, nontender,  non-distended  MS: R shoulder tender to palpations.  No limitation in ROM.  No edema; No deformity. Neuro:  AAOx3. Psych: Normal affect  Labs    Chemistry  Recent Labs Lab 12/29/16 0142 12/30/16 0328 12/31/16 0323 01/01/17 0235  NA 136 132* 134* 136  K 4.4 3.4* 4.0 3.6  CL 106 102 105 107  CO2 24 22 21* 20*  GLUCOSE 86 153* 145* 145*  BUN 22* 10 9 6   CREATININE 0.73 0.58 0.56 0.58  CALCIUM 7.7* 7.5* 7.9* 7.9*  PROT 6.9 6.8 6.4*  --   ALBUMIN 3.1* 3.2* 3.0*  --   AST 54* 40 29  --   ALT 34 34 29  --   ALKPHOS 46 48 40  --   BILITOT 0.5 0.3 0.2*  --   GFRNONAA >60 >60 >60 >60  GFRAA >60 >60 >60 >60  ANIONGAP 6 8 8 9      Hematology  Recent Labs Lab 12/30/16 0328 12/31/16 0323 01/01/17 0235  WBC 8.2 6.1 6.8  RBC 3.57* 3.47* 3.40*  HGB 9.3* 9.2* 8.8*  HCT 27.7* 26.7* 26.6*  MCV 77.6* 76.9* 78.2  MCH 26.1 26.5 25.9*  MCHC 33.6 34.5 33.1  RDW 13.4 13.5 13.4  PLT 221 252 277    Cardiac Enzymes  Recent Labs Lab 12/28/16 1709 12/28/16 1903 12/29/16 0052 12/29/16 0713  TROPONINI 4.87* 4.72* 2.61* 1.90*     Recent Labs Lab 12/28/16 1610 12/28/16 1625  TROPIPOC 5.01* 4.16*     BNPNo results for input(s): BNP, PROBNP in the last 168 hours.   DDimer No results for input(s): DDIMER in the last 168 hours.   Radiology    No results found.  Cardiac Studies   Echo 12/29/16: Study Conclusions  - Left ventricle: The cavity size was normal. Wall thickness was   increased in a pattern of mild LVH. Systolic function was normal.   The estimated ejection fraction was in the range of 55% to 60%.   There is hypokinesis of the apicalinferior and apical myocardium.   Doppler parameters are consistent with abnormal left ventricular   relaxation (grade 1 diastolic dysfunction). - Aortic valve: Mildly calcified annulus. Trileaflet; mildly   calcified leaflets. Mean gradient (S): 13 mm Hg. Peak gradient   (S): 21 mm Hg. VTI ratio of LVOT to aortic valve:  0.56. - Mitral valve: Calcified annulus. There was trivial regurgitation. - Right ventricle: The cavity size was mildly dilated. Systolic   function was reduced. - Right atrium: Central venous pressure (est): 15 mm Hg. - Tricuspid valve: There was trivial regurgitation. - Pulmonary arteries: PA peak pressure: 35 mm Hg (S). - Pericardium, extracardiac: There was no pericardial effusion.  Impressions:  - Mild LVH with LVEF 55-60%. There is hypokinesis of the apical   inferior and apical myocardium. Grade 1 diastolic dysfunction.   Calcified mitral annulus with trivial mitral regurgitation.   Moderately sclerotic aortic valve without obvious stenosis.   Limited views of the right ventricle suggest at least mild   dilatation and significantly reduced contraction. Trivial   tricuspid regurgitation with PASP 35 mmHg and estimated elevated   CVP.  LHC 12/31/16:  Mid RCA lesion, 25 %stenosed.  Ost LAD to Prox LAD lesion, 40 %stenosed.  LV end diastolic pressure is normal.  Mid LAD lesion, 80 %stenosed. A STENT SYNERGY DES 3X24 drug eluting stent was successfully placed, postdilated to 3.25.  Post intervention, there is a 0% residual stenosis.  Prox Cx to Mid Cx lesion, 75 %stenosed. A STENT SYNERGY DES 3.5X28 drug eluting stent was successfully placed.  Post intervention, there is a 0% residual stenosis.  Patient Profile     76 y.o. female with mild aortic stenosis, hypertension, hyperlipidemia, here with NSTEMI in the setting on community-acquired pneumonia and UTI.  Assessment & Plan    # NSTEMI: Troponin elevated to 5.  She was found to have severe stenosis of the LAD and LCx with only a 25% RCA lesion.  She underwent successful PCI of the LAD and LCx arteries.  She was started on aspirin and ticagrelor.  Continue aspirin and ticagrelor.  Heart rates are in the 60s-70s.  We will add carvedilol 3.125mg  bid.   # Paroxysmal atrial fibrillation:  Ms. Valenta has been in and out of  atrial fibrillation.  She is currently in sinus rhythm.  I suspect this was related to her acute illness.  Given that she was started on aspirin and ticagrelor, we will not start anticoagulation at this time.  Rates in atrial fibrillation are well-controlled. This patients CHA2DS2-VASc Score and unadjusted Ischemic Stroke Rate (% per year) is equal to 7.2 % stroke rate/year from a score of 5  Above score calculated as 1 point each if present [CHF, HTN, DM, Vascular=MI/PAD/Aortic Plaque, Age if 65-74, or Female] Above score calculated as 2 points each if present [Age > 75, or Stroke/TIA/TE]  # Hypertension: Continue irbesartan and add carvedilol.  BP above goal.  # Hyperlipidemia:  Ms. Rivers is on lovastatin as an outpatient. LDL 52.  We will switch to rosuvastatin 20mg .  # CAP: # UTI: Antibiotics per IM  # Shoulder pain: Ms. Boerema continues to complain of R shoulder pain.     Signed, Chilton Si, MD  01/01/2017, 8:12 AM

## 2017-01-01 NOTE — Care Management Important Message (Signed)
Important Message  Patient Details  Name: Amy Hood MRN: [id number] Date of Birth: 1941-03-22   Medicare Important Message Given:  Yes    [name] 01/01/2017, 12:17 PM

## 2017-01-01 NOTE — Progress Notes (Signed)
TR BAND REMOVAL  LOCATION:    right radial  DEFLATED PER PROTOCOL:    Yes.    TIME BAND OFF / DRESSING APPLIED:    20:45   SITE UPON ARRIVAL:    Level 0  SITE AFTER BAND REMOVAL:    Level 1(Bruise) CIRCULATION SENSATION AND MOVEMENT:    Within Normal Limits   Yes.    COMMENTS:   Post TR band instructions given to pt & family. Pt tolerated well.

## 2017-01-02 ENCOUNTER — Telehealth: Payer: Self-pay | Admitting: Physician Assistant

## 2017-01-02 LAB — BASIC METABOLIC PANEL
ANION GAP: 8 (ref 5–15)
BUN: <5 mg/dL — ABNORMAL LOW (ref 6–20)
CO2: 23 mmol/L (ref 22–32)
Calcium: 8.3 mg/dL — ABNORMAL LOW (ref 8.9–10.3)
Chloride: 105 mmol/L (ref 101–111)
Creatinine, Ser: 0.61 mg/dL (ref 0.44–1.00)
GFR calc Af Amer: >60 mL/min (ref >60)
Glucose, Bld: 162 mg/dL — ABNORMAL HIGH (ref 65–99)
POTASSIUM: 4.1 mmol/L (ref 3.5–5.1)
SODIUM: 136 mmol/L (ref 135–145)

## 2017-01-02 LAB — GLUCOSE, CAPILLARY
GLUCOSE-CAPILLARY: 166 mg/dL — AB (ref 65–99)
Glucose-Capillary: 153 mg/dL — ABNORMAL HIGH (ref 65–99)
Glucose-Capillary: 94 mg/dL (ref 65–99)

## 2017-01-02 LAB — CBC
HCT: 26.8 % — ABNORMAL LOW (ref 36.0–46.0)
Hemoglobin: 8.8 g/dL — ABNORMAL LOW (ref 12.0–15.0)
MCH: 25.9 pg — ABNORMAL LOW (ref 26.0–34.0)
MCHC: 32.8 g/dL (ref 30.0–36.0)
MCV: 78.8 fL (ref 78.0–100.0)
PLATELETS: 305 10*3/uL (ref 150–400)
RBC: 3.4 MIL/uL — AB (ref 3.87–5.11)
RDW: 13.4 % (ref 11.5–15.5)
WBC: 8.2 10*3/uL (ref 4.0–10.5)

## 2017-01-02 LAB — CULTURE, BLOOD (ROUTINE X 2)

## 2017-01-02 MED ORDER — CARVEDILOL 3.125 MG PO TABS
6.2500 mg | ORAL_TABLET | Freq: Two times a day (BID) | ORAL | Status: DC
  Administered 2017-01-02 (×2): 6.25 mg via ORAL
  Filled 2017-01-02 (×2): qty 2

## 2017-01-02 MED ORDER — ROSUVASTATIN CALCIUM 20 MG PO TABS: 20.0000 mg | ORAL_TABLET | Freq: Every day | ORAL | 2 refills | Status: AC

## 2017-01-02 MED ORDER — CIPROFLOXACIN HCL 500 MG PO TABS: 500.0000 mg | ORAL_TABLET | Freq: Two times a day (BID) | ORAL | 0 refills | Status: DC

## 2017-01-02 MED ORDER — TICAGRELOR 90 MG PO TABS: 90.0000 mg | ORAL_TABLET | Freq: Two times a day (BID) | ORAL | 3 refills | Status: DC

## 2017-01-02 MED ORDER — HYDROCODONE-ACETAMINOPHEN 5-325 MG PO TABS: 1.0000 | ORAL_TABLET | Freq: Four times a day (QID) | ORAL | 0 refills | Status: DC | PRN

## 2017-01-02 MED ORDER — CARVEDILOL 6.25 MG PO TABS: 6.2500 mg | ORAL_TABLET | Freq: Two times a day (BID) | ORAL | 2 refills | Status: DC

## 2017-01-02 MED ORDER — CHOLECALCIFEROL 100 MCG (4000 UT) PO TABS: 4000.0000 [IU] | ORAL_TABLET | Freq: Every day | ORAL | 3 refills | Status: DC

## 2017-01-02 NOTE — Discharge Instructions (Signed)
Call [facility] at [phone] if any bleeding, swelling or drainage at cath site.  May shower, no tub baths for 48 hours for groin sticks. No lifting over 5 pounds for 5 days.  No Driving for 5 days if you drive.  Take 1 NTG, under your tongue, while sitting.  If no relief of pain may repeat NTG, one tab every 5 minutes up to 3 tablets total over 15 minutes.  If no relief CALL 911.  If you have dizziness/lightheadness  while taking NTG, stop taking and call 911.

## 2017-01-02 NOTE — Telephone Encounter (Signed)
New message      [facility]

## 2017-01-02 NOTE — Progress Notes (Signed)
Occupational Therapy Treatment Patient Details Name: Amy Hood MRN: [id number] DOB: 12-19-1940 Today's Date: 01/02/2017    History of present illness 76 y.o. vietnamese speaking female with complex PMH including long-standing diabetes mellitus with microvascular complications, dyslipidemia, and hypertension who was brought to ED by family who was concerned about an acute decline in patient's condition.  Dx NSTEMI, afib, DM2, CAP    OT comments  Pt seen for brief session this pm. Pt very sleepy. Began educating on A/AAROM RUE in FF, Abd and ER within pain tolerance. Educated grand daughter on use of alternating ice/heat to help with pain control. Will follow up in am to establish HEP for scapular strengthening and rotator cuff strengthening. If symptoms do not improve, may benefit from follow up with outpt therapy. Grand daughter states she will be here in the am to translate.   Follow Up Recommendations  Supervision/Assistance - 24 hour;Other (comment) (outpatient therapy for R shoulder)    Equipment Recommendations  None recommended by OT    Recommendations for Other Services      Precautions / Restrictions Precautions Precautions: Fall Precaution Comments: R shoulder pain                                                                                              Cognition   Behavior [facility] tasks assessed/performed Overall Cognitive Status: Within Functional Limits for tasks assessed                       Extremity/Trunk Assessment   R shoulder pain. Will further assess when pt more alert.  Family requesting for pt to rest            Exercises Other Exercises Other Exercises: educated pt/family on alternating ice/heat for pain control Other Exercises: R shoulder AAROM FF; Abd and ER - supine position   Shoulder Instructions       General Comments      Pertinent Vitals/ Pain       Pain  Assessment: Faces Faces Pain Scale: Hurts a little bit Pain Location: right shoulder Pain Descriptors / Indicators: Aching;Sore Pain Intervention(s): Limited activity within patient's tolerance  Home Living                                          Prior Functioning/Environment              Frequency  Min 2X/week        Progress Toward Goals  OT Goals(current goals can now be found in the care plan section)  Progress towards OT goals: Progressing toward goals  Acute Rehab OT Goals OT Goal Formulation: With patient/family Time For Goal Achievement: 01/06/17 Potential to Achieve Goals: Good ADL Goals Pt Will Perform Grooming: with supervision;standing Pt Will Perform Upper Body Bathing: with supervision;sitting Pt Will Perform Lower Body Bathing: with supervision;sit to/from stand Pt Will Perform Upper Body Dressing: with supervision;sitting Pt Will Perform Lower Body Dressing: with supervision;sit to/from stand Pt Will Transfer  to Toilet: with supervision;ambulating;regular height toilet;grab bars Pt Will Perform Toileting - Clothing Manipulation and hygiene: with supervision;sit to/from stand  Plan Discharge plan needs to be updated    Co-evaluation                 End of Session     Activity Tolerance Patient limited by fatigue   Patient Left in bed;with call bell/phone within reach;with family/visitor present   Nurse Communication Other (comment) (will follow up in am)        Time: 6195-0932 OT Time Calculation (min): 13 min  Charges: OT General Charges $OT Visit: 1 Procedure OT Treatments $Therapeutic Activity: 8-22 mins  Vance Hochmuth,HILLARY 01/02/2017, 3:31 PM   [facility], OT/L  [phone] 01/02/2017

## 2017-01-02 NOTE — Consult Note (Signed)
[facility]edic [facility] H&P/Consult     Chief Complaint: acute on chronic R shoulder pain  HPI:   Amy Hood is an 76 y.o.RHD, vietnamese female admitted on 12/28/2016 with several day history of malaise, weakness, decreased appetite. Pt admitted to medicine service and found to have PNA and UTI. Cardiac w/u also notable for elevated troponin. Pt therefore admitted with PNA, UTI and NSTEMI. Pt also with AFib.  Medical hx also notable for DM type 2, HTN.    Form an ortho standpoint pt has chronic R shoulder pain. Family at bedside this am and confirm that this is chronic, it is usually well controlled with po medications (norco- written for by PCP) but it is exacerbated during this hospital admission. MRI was performed yesterday which shows fairly diffuse tendinitis/tendinosis. Minimal tearing of posterior fibers of supraspinatus also noted.   Pt seen and evaluated by Ortho today. Family at bedside. Report pain actually doing better today. Pt has never done formal therapy and does not have an active orthopaedist.    Past Medical History:  Diagnosis Date  . Arthritis   . Cataracts, bilateral   . Essential hypertension   . Hyperlipidemia   . Mild aortic stenosis 12/30/2016  . Type 2 diabetes mellitus ([place])     Past Surgical History:  Procedure Laterality Date  . CARDIAC CATHETERIZATION N/A 12/31/2016   Procedure: Left Heart Cath and Coronary Angiography;  Surgeon: Jettie Booze, MD;  Location: [place] [facility];  Service: Cardiovascular;  Laterality: N/A;  . CARDIAC CATHETERIZATION N/A 12/31/2016   Procedure: Coronary Stent Intervention;  Surgeon: Jettie Booze, MD;  Location: [facility];  Service: Cardiovascular;  Laterality: N/A;  . KYPHOPLASTY      Family History  Problem Relation Age of Onset  . Diabetes Mother    Social History:  reports that she has never smoked. She has never used smokeless tobacco. She reports that she does not drink alcohol or use  drugs.  Allergies: No Known Allergies  Medications Prior to Admission  Medication Sig Dispense Refill  . acetaminophen (ACETAMINOPHEN 8 HOUR) 650 MG CR tablet Take 650 mg by mouth every 8 (eight) hours as needed for pain.    Marland Kitchen aspirin 81 MG chewable tablet Chew 81 mg by mouth daily.    . cholecalciferol (VITAMIN D) 1000 UNITS tablet Take 1,000 Units by mouth daily.    Marland Kitchen glimepiride (AMARYL) 4 MG tablet Take 4 mg by mouth daily before breakfast.    . HYDROcodone-acetaminophen (NORCO/VICODIN) 5-325 MG per tablet Take 1 tablet by mouth every 6 (six) hours as needed. 20 tablet 0  . iron polysaccharides (NIFEREX) 150 MG capsule Take 1 capsule (150 mg total) by mouth daily. 30 capsule 3  . lovastatin (MEVACOR) 40 MG tablet Take 40 mg by mouth daily.    Loma [place] (OYSTER CALCIUM) 500 MG TABS tablet Take 500 mg of elemental calcium by mouth 2 (two) times daily.    . pioglitazone (ACTOS) 45 MG tablet Take 45 mg by mouth daily.    . valsartan (DIOVAN) 160 MG tablet Take 80 mg by mouth daily.    Marland Kitchen alendronate (FOSAMAX) 70 MG tablet Take 70 mg by mouth every 7 (seven) days. Take with a full glass of water on an empty stomach.      Results for orders placed or performed during the hospital encounter of 12/28/16 (from the past 48 hour(s))  Glucose, capillary     Status: Abnormal   Collection  Time: 12/31/16 12:26 PM  Result Value Ref Range   Glucose-Capillary 141 (H) 65 - 99 mg/dL  Glucose, capillary     Status: Abnormal   Collection Time: 12/31/16  2:16 PM  Result Value Ref Range   Glucose-Capillary 108 (H) 65 - 99 mg/dL  POCT Activated clotting time     Status: None   Collection Time: 12/31/16  4:05 PM  Result Value Ref Range   Activated Clotting Time 257 seconds  POCT Activated clotting time     Status: None   Collection Time: 12/31/16  4:26 PM  Result Value Ref Range   Activated Clotting Time 323 seconds  Glucose, capillary     Status: Abnormal   Collection Time: 12/31/16 10:53 PM   Result Value Ref Range   Glucose-Capillary 172 (H) 65 - 99 mg/dL   Comment 1 Notify RN    Comment 2 Document in Chart   CBC     Status: Abnormal   Collection Time: 01/01/17  2:35 AM  Result Value Ref Range   WBC 6.8 4.0 - 10.5 K/uL   RBC 3.40 (L) 3.87 - 5.11 MIL/uL   Hemoglobin 8.8 (L) 12.0 - 15.0 g/dL   HCT 26.6 (L) 36.0 - 46.0 %   MCV 78.2 78.0 - 100.0 fL   MCH 25.9 (L) 26.0 - 34.0 pg   MCHC 33.1 30.0 - 36.0 g/dL   RDW 13.4 11.5 - 15.5 %   Platelets 277 150 - 400 K/uL  Basic metabolic panel     Status: Abnormal   Collection Time: 01/01/17  2:35 AM  Result Value Ref Range   Sodium 136 135 - 145 mmol/L   Potassium 3.6 3.5 - 5.1 mmol/L   Chloride 107 101 - 111 mmol/L   CO2 20 (L) 22 - 32 mmol/L   Glucose, Bld 145 (H) 65 - 99 mg/dL   BUN 6 6 - 20 mg/dL   Creatinine, Ser 0.58 0.44 - 1.00 mg/dL   Calcium 7.9 (L) 8.9 - 10.3 mg/dL   GFR calc non Af Amer >60 >60 mL/min   GFR calc Af Amer >60 >60 mL/min    Comment: (NOTE) The eGFR has been calculated using the CKD EPI equation. This calculation has not been validated in all clinical situations. eGFR's persistently <60 mL/min signify possible Chronic Kidney Disease.    Anion gap 9 5 - 15  Glucose, capillary     Status: Abnormal   Collection Time: 01/01/17  6:35 AM  Result Value Ref Range   Glucose-Capillary 140 (H) 65 - 99 mg/dL   Comment 1 Notify RN    Comment 2 Document in Chart   Uric acid     Status: None   Collection Time: 01/01/17 10:30 AM  Result Value Ref Range   Uric Acid, Serum 3.2 2.3 - 6.6 mg/dL  Glucose, capillary     Status: Abnormal   Collection Time: 01/01/17 11:16 AM  Result Value Ref Range   Glucose-Capillary 162 (H) 65 - 99 mg/dL  Glucose, capillary     Status: Abnormal   Collection Time: 01/01/17  5:27 PM  Result Value Ref Range   Glucose-Capillary 118 (H) 65 - 99 mg/dL  Glucose, capillary     Status: Abnormal   Collection Time: 01/01/17 10:24 PM  Result Value Ref Range   Glucose-Capillary 145 (H)  65 - 99 mg/dL   Comment 1 Notify RN    Comment 2 Document in Chart   CBC     Status: Abnormal  Collection Time: 01/02/17  2:32 AM  Result Value Ref Range   WBC 8.2 4.0 - 10.5 K/uL   RBC 3.40 (L) 3.87 - 5.11 MIL/uL   Hemoglobin 8.8 (L) 12.0 - 15.0 g/dL   HCT 26.8 (L) 36.0 - 46.0 %   MCV 78.8 78.0 - 100.0 fL   MCH 25.9 (L) 26.0 - 34.0 pg   MCHC 32.8 30.0 - 36.0 g/dL   RDW 13.4 11.5 - 15.5 %   Platelets 305 150 - 400 K/uL  Basic metabolic panel     Status: Abnormal   Collection Time: 01/02/17  2:32 AM  Result Value Ref Range   Sodium 136 135 - 145 mmol/L   Potassium 4.1 3.5 - 5.1 mmol/L   Chloride 105 101 - 111 mmol/L   CO2 23 22 - 32 mmol/L   Glucose, Bld 162 (H) 65 - 99 mg/dL   BUN <5 (L) 6 - 20 mg/dL   Creatinine, Ser 0.61 0.44 - 1.00 mg/dL   Calcium 8.3 (L) 8.9 - 10.3 mg/dL   GFR calc non Af Amer >60 >60 mL/min   GFR calc Af Amer >60 >60 mL/min    Comment: (NOTE) The eGFR has been calculated using the CKD EPI equation. This calculation has not been validated in all clinical situations. eGFR's persistently <60 mL/min signify possible Chronic Kidney Disease.    Anion gap 8 5 - 15  Glucose, capillary     Status: Abnormal   Collection Time: 01/02/17  7:06 AM  Result Value Ref Range   Glucose-Capillary 153 (H) 65 - 99 mg/dL   Comment 1 Notify RN    Comment 2 Document in Chart    Results for SOPHEA, RACKHAM (MRN [id number]) as of 01/02/2017 11:20  Ref. Range 12/29/2016 00:52  Vitamin D, 25-Hydroxy Latest Ref Range: 30.0 - 100.0 ng/mL 24.8 (L)    Dg Shoulder Right  Result Date: 01/01/2017 CLINICAL DATA:  Right shoulder generalized pain, no known injury EXAM: RIGHT SHOULDER - 2+ VIEW COMPARISON:  None. FINDINGS: Three views of the right shoulder submitted. No acute fracture or subluxation. Mild degenerative changes AC joint. Mild spurring of acromion. Glenohumeral joint is preserved. Tiny calcification adjacent of the humeral head probable calcific tendinosis. IMPRESSION: No  acute fracture or subluxation. Mild degenerative changes AC joint. Mild spurring of acromion. Glenohumeral joint is preserved. Tiny calcification adjacent of the humeral head probable calcific tendinosis. Electronically Signed   By: Lahoma Crocker M.D.   On: 01/01/2017 10:11   Mr Shoulder Right Wo Contrast  Result Date: 01/01/2017 CLINICAL DATA:  Right shoulder pain. EXAM: MRI OF THE RIGHT SHOULDER WITHOUT CONTRAST TECHNIQUE: Multiplanar, multisequence MR imaging of the shoulder was performed. No intravenous contrast was administered. COMPARISON:  None. FINDINGS: Rotator cuff: Severe tendinosis of the supraspinatus tendon with a small partial-thickness bursal surface tear of the posterior fibers. Moderate tendinosis of the infraspinatus tendon. Teres minor tendon is intact. Subscapularis tendon is intact. Muscles: No atrophy or fatty replacement of the muscles of the rotator cuff. Mild muscle edema in the teres minor muscle likely reflecting mild muscle strain. Biceps long head: Moderate tendinosis of the intraarticular portion of the long head of the biceps tendon. Acromioclavicular Joint: Mild arthropathy of the acromioclavicular joint. Type II acromion. Small amount of subacromial/subdeltoid bursal fluid. Glenohumeral Joint: No joint effusion.  No chondral defect. Labrum: Grossly intact, but evaluation is limited by lack of intraarticular fluid. Bones:  No marrow signal abnormality.  No fracture or dislocation. Other: No fluid  collection or hematoma. IMPRESSION: 1. Severe tendinosis of the supraspinatus tendon with a small partial-thickness bursal surface tear of the posterior fibers. 2. Moderate tendinosis of the infraspinatus tendon. 3. Moderate tendinosis of the intraarticular portion of the long head of the biceps tendon. 4. Mild subacromial/subdeltoid bursitis. Electronically Signed   By: Kathreen Devoid   On: 01/01/2017 21:17    Review of Systems  Unable to perform ROS: Language    Blood pressure (!)  135/99, pulse 68, temperature 97.8 F (36.6 C), temperature source Oral, resp. rate (!) 22, height 5' (1.524 m), weight 78 kg (171 lb 15.3 oz), SpO2 98 %. Physical Exam  Constitutional: She is cooperative. No distress.  Appears very comfortable, lying in bed  Musculoskeletal:  Right upper extremity  Inspection:    Shoulder is w/o effusion    No erythema     Elbow, forearm, wrist and hand are unremarkable Bony eval:    No pain with palpation of shoulder or clavicle     Elbow, forearm, wrist and hand are nontender  Soft tissue:     No significant swelling to the R UEx    nontender over biceps tendon      ROM:    Full active ROM R shoulder noted, symmetric to contralateral side   Sensation:    Radial, ulnar, median, axillary nerve sensation intact Motor:    Radial, ulnar, median, AIN, PIN motor intact Vascular:    Ext warm     + Radial pulse   Special Test:     No pain with hawkins kennedy impingement test     Pt able to slowly lower R arm against gravity w/o problem (negative drop arm)  Neurological: She is alert.      Assessment/Plan  76 y/o RHD female with acute on chronic R shoulder pain, rotator cuff tendinopathy. Admitted for PNA, UTI and NSTEMI. S/p cardiac cath   -acute on chronic R shoulder pain likely related to R RTC tendinopathy  Pain flare likely related to acute physiologic stress that pt is under given that it is well controlled with oral meds at baseline   MRI does not indicate any need for surgical intervention at this time     Exam today seems very favorable     Initial phase of treatment includes physical therapy, including RTC home exercise program. Will have OT review this with pt    Given pts history of DM she is at risk for developing adhesive capsulitis if she remains immobile.   No ROM or lifting restrictions to R shoulder. Want pt moving as much as possible      In the setting of recent MI and cardiac cath would hold on NSAIDs due to  increased risk for MI/cardiac events  If pain persists on follow up would consider corticosteroid injection as this would be targeted delivery and has less systemic impact      Pt uses Norco at baseline to control her shoulder pain    Would continue with low dose norco for a short course   Could cycle ice and heat to see which provides most benefit    Again, first line treatment is PT, either home exercise program (HEP) and/or formal outpt pt. If pts pain persists at follow up will write for outpt PT and will also include U/S or iontophoresis    F/u with ortho in 2 weeks   -medical issues   Per cardiology and medicine   - vitamin D insufficiency   Pt  currently taking 1000 IU d3 daily   Could increase her to 4000-5000 IU daily   Vitamin d insufficiency and deficiency linked with musculoskeletal dysfunction (weakness, poor balance)  - Fosamax use  Duration of use needs to be reviewed   Would recommend that if the pt has been on this >3 years that it is time for drug holiday and/or different class of agent for osteoporosis   Prolonged therapy increases risk of atypical fractures   Defer to PCP     - Dispo:  Continue per other services  Follow up with ortho in 2 weeks  RTC HEP 1-2x day       Jari Pigg, PA-C [facility] [phone] (P) 01/02/2017, 10:24 AM

## 2017-01-02 NOTE — Discharge Summary (Signed)
Physician Discharge Summary  Amy Hood UJW:[id number] DOB: 1941-02-21 DOA: 12/28/2016  PCP: Georgann Housekeeper, MD  Admit date: 12/28/2016 Discharge date: 01/02/2017  Time spent: 25 minutes  Recommendations for Outpatient Follow-up:  1. Follow up cardiology in 2 weeks 2. Follow up orthopedics in 2 weeks   Discharge Diagnoses:  Principal Problem:   NSTEMI (non-ST elevated myocardial infarction) (HCC) Active Problems:   Hyperlipidemia   Cataracts, bilateral   Essential hypertension   Arthritis   CAP (community acquired pneumonia)   Fever   Leukocytosis   Hyponatremia   UTI (urinary tract infection)   Elevated troponin   CAD (coronary artery disease)   Abnormal ECG   Generalized weakness   Non-English speaking patient   Thrombocytopenia (HCC)   Atrial fibrillation with slow ventricular response (HCC)   Hard of hearing   Acute cystitis without hematuria   Mild aortic stenosis   Pressure injury of skin   Discharge Condition: Stable  Diet recommendation: heart healthy diet  Filed Weights   12/31/16 0434 01/01/17 0242 01/02/17 0451  Weight: 82 kg (180 lb 12.4 oz) 77.2 kg (170 lb 3.1 oz) 78 kg (171 lb 15.3 oz)    History of present illness:  75 y.o.vietnamese speaking femalewith complex PMH including long-standing diabetes mellitus with microvascular complications, dyslipidemia, and hypertension who was brought to ED by family who was concerned about an acute decline in patient's condition. Family reports that overpast 2-3 days she has been staying in bed, too weak to ambulate or do daily activities. She has had decreased appetite and decreased by mouth intake. They states that she has been complaining of feeling chills and feeling cold. Pt was found to be febrile with pneumonia seen on chest xray and infected urine was seen. In addition, patient noted to have elevated troponin I. She was started on IV heparin infusion and IV antibiotics and admission was requested.    Hospital Course:  1. Non-STEMI-patient had a negative troponin, underwent cardiac catheterization , had severe stenosis of LAD and left circumflex with 25% ostial lesion. She underwent PCI of the LAD and left circumflex arteries. Continue aspirin and Ticagrelor.Continue Crestor. Follow up cardiology in 2 weeks. 2. Community-acquired pneumonia- patient started on ceftriaxone and Zithromax. We'll continue with his antibiotics in the hospital. At this time she has completed the antibiotics for 6 days in the hospital. Will dicontinue Ceftriaxone and Zithromax. 3. Escherichia coli bacteremia - blood cultures 1 out of 2 bottles, grew Escherichia coli sensitive to ceftriaxone, Ciprofloxacin. We will continue with Ciprofloxacin for 8 more days to complete 14 days of therapy. 4. UTI-urine culture growing Escherichia coli. Continue Ciprofloxacin. 5.  Paroxysmal atrial fibrillation- Heart rate  is controlled, not on anticoagulation as patient is on aspirin and ticagrelor. 6. Diabetes mellitus-continue sliding scale insulin alone. Hemoglobin A1c is 6.3 7. Essential hypertension - continue irbesartan, carvedilol  8. Right shoulder pain- patient complains of severe pain in right shoulder, which has been chronic. X-ray of right shoulder obtained today shows changes of arthritis, tiny calcification and discontinue him to head probable calcific tendinosis. MRI shoulder was obtained which showed1. Severe tendinosis of the supraspinatus tendon with a small        partial-thickness bursal surface tear of the posterior fibers. Moderate tendinosis of the infraspinatus tendon.Modera tendinosis of the intraarticular portion of the long head of the biceps tendon.       . Mild subacromial/subdeltoid bursitis. Orthopedics Dr Carola Frost was consulted, no surgery at this time, will discharge on Vicodin prn  and she will follow up Orthopedics as outpatient.  Procedures:  Cardiac cathetreization,  PCI  Consultations:  Cardiology  Orthopedics  Discharge Exam: Vitals:   01/02/17 0800 01/02/17 1136  BP: (!) 135/99   Pulse:    Resp:    Temp: 97.8 F (36.6 C) 97.9 F (36.6 C)    General: Appears in no acute distress Cardiovascular: RRR, S1S2 Respiratory: Clear bilaterally  Discharge Instructions   Discharge Instructions    Amb Referral to Cardiac Rehabilitation    Complete by:  As directed    Diagnosis:   PTCA NSTEMI Coronary Stents     Diet - low sodium heart healthy    Complete by:  As directed    Increase activity slowly    Complete by:  As directed      Current Discharge Medication List    START taking these medications   Details  carvedilol (COREG) 6.25 MG tablet Take 1 tablet (6.25 mg total) by mouth 2 (two) times daily with a meal. Qty: 60 tablet, Refills: 2    ciprofloxacin (CIPRO) 500 MG tablet Take 1 tablet (500 mg total) by mouth 2 (two) times daily. Qty: 16 tablet, Refills: 0    rosuvastatin (CRESTOR) 20 MG tablet Take 1 tablet (20 mg total) by mouth daily at 6 PM. Qty: 30 tablet, Refills: 2    ticagrelor (BRILINTA) 90 MG TABS tablet Take 1 tablet (90 mg total) by mouth 2 (two) times daily. Qty: 60 tablet, Refills: 3      CONTINUE these medications which have CHANGED   Details  cholecalciferol 4000 units TABS Take 4,000 Units by mouth daily. Qty: 30 tablet, Refills: 3    HYDROcodone-acetaminophen (NORCO/VICODIN) 5-325 MG tablet Take 1 tablet by mouth every 6 (six) hours as needed. Qty: 20 tablet, Refills: 0      CONTINUE these medications which have NOT CHANGED   Details  aspirin 81 MG chewable tablet Chew 81 mg by mouth daily.    glimepiride (AMARYL) 4 MG tablet Take 4 mg by mouth daily before breakfast.    iron polysaccharides (NIFEREX) 150 MG capsule Take 1 capsule (150 mg total) by mouth daily. Qty: 30 capsule, Refills: 3    Oyster Shell (OYSTER CALCIUM) 500 MG TABS tablet Take 500 mg of elemental calcium by mouth 2 (two)  times daily.    pioglitazone (ACTOS) 45 MG tablet Take 45 mg by mouth daily.    valsartan (DIOVAN) 160 MG tablet Take 80 mg by mouth daily.    alendronate (FOSAMAX) 70 MG tablet Take 70 mg by mouth every 7 (seven) days. Take with a full glass of water on an empty stomach.      STOP taking these medications     acetaminophen (ACETAMINOPHEN 8 HOUR) 650 MG CR tablet      lovastatin (MEVACOR) 40 MG tablet        No Known Allergies Follow-up Information    Chilton Si, MD Follow up on 01/13/2017.   Specialty:  Cardiology Why:  at 10:00 AM with Theodore Demark, one of Dr. Leonides Sake PA Contact information: [address] [place] [address] [place] [address] [phone]            The results of significant diagnostics from this hospitalization (including imaging, microbiology, ancillary and laboratory) are listed below for reference.    Significant Diagnostic Studies: Dg Chest 2 View  Result Date: 12/28/2016 CLINICAL DATA:  Fever and cough today.  Lethargy. EXAM: CHEST  2 VIEW COMPARISON:  09/18/2013 FINDINGS: Mild  cardiomegaly. Aortic atherosclerosis. Patchy density both lung bases consistent with mild bronchopneumonia. No dense consolidation or collapse. Old vertebral augmentation. No acute bone finding. IMPRESSION: Cardiomegaly.  Aortic atherosclerosis. Patchy density both lung bases consistent with mild pneumonia. No dense consolidation or lobar collapse. Electronically Signed   By: Paulina FusiMark  Shogry M.D.   On: 12/28/2016 15:32   Dg Shoulder Right  Result Date: 01/01/2017 CLINICAL DATA:  Right shoulder generalized pain, no known injury EXAM: RIGHT SHOULDER - 2+ VIEW COMPARISON:  None. FINDINGS: Three views of the right shoulder submitted. No acute fracture or subluxation. Mild degenerative changes AC joint. Mild spurring of acromion. Glenohumeral joint is preserved. Tiny calcification adjacent of the humeral head probable calcific tendinosis. IMPRESSION: No acute fracture or  subluxation. Mild degenerative changes AC joint. Mild spurring of acromion. Glenohumeral joint is preserved. Tiny calcification adjacent of the humeral head probable calcific tendinosis. Electronically Signed   By: Natasha MeadLiviu  Pop M.D.   On: 01/01/2017 10:11   Mr Shoulder Right Wo Contrast  Result Date: 01/01/2017 CLINICAL DATA:  Right shoulder pain. EXAM: MRI OF THE RIGHT SHOULDER WITHOUT CONTRAST TECHNIQUE: Multiplanar, multisequence MR imaging of the shoulder was performed. No intravenous contrast was administered. COMPARISON:  None. FINDINGS: Rotator cuff: Severe tendinosis of the supraspinatus tendon with a small partial-thickness bursal surface tear of the posterior fibers. Moderate tendinosis of the infraspinatus tendon. Teres minor tendon is intact. Subscapularis tendon is intact. Muscles: No atrophy or fatty replacement of the muscles of the rotator cuff. Mild muscle edema in the teres minor muscle likely reflecting mild muscle strain. Biceps long head: Moderate tendinosis of the intraarticular portion of the long head of the biceps tendon. Acromioclavicular Joint: Mild arthropathy of the acromioclavicular joint. Type II acromion. Small amount of subacromial/subdeltoid bursal fluid. Glenohumeral Joint: No joint effusion.  No chondral defect. Labrum: Grossly intact, but evaluation is limited by lack of intraarticular fluid. Bones:  No marrow signal abnormality.  No fracture or dislocation. Other: No fluid collection or hematoma. IMPRESSION: 1. Severe tendinosis of the supraspinatus tendon with a small partial-thickness bursal surface tear of the posterior fibers. 2. Moderate tendinosis of the infraspinatus tendon. 3. Moderate tendinosis of the intraarticular portion of the long head of the biceps tendon. 4. Mild subacromial/subdeltoid bursitis. Electronically Signed   By: Elige KoHetal  Patel   On: 01/01/2017 21:17   Dg Chest Port 1 View  Result Date: 12/29/2016 CLINICAL DATA:  Pneumonia EXAM: PORTABLE CHEST 1  VIEW COMPARISON:  12/29/2016 FINDINGS: Stable enlarged to cardiac silhouette. Interval improvement in central venous congestion. No pneumothorax. IMPRESSION: Cardiomegaly with improved central venous congestion. Electronically Signed   By: Genevive BiStewart  Edmunds M.D.   On: 12/29/2016 18:59   Dg Chest Port 1 View  Result Date: 12/29/2016 CLINICAL DATA:  Pneumonia. EXAM: PORTABLE CHEST 1 VIEW COMPARISON:  Frontal and lateral views yesterday. FINDINGS: Lower lung volumes from prior exam with bronchovascular crowding. Cardiomegaly with tortuous atherosclerotic thoracic aorta. Increasing bibasilar and left perihilar streaky opacities. Limited assessment of pulmonary vasculature given technique, question mild pulmonary edema. No pleural fluid. No pneumothorax. Remote right rib fracture. IMPRESSION: Lower lung volumes from prior exam with bronchovascular crowding. Question mild pulmonary edema versus technique. Increasing bibasilar and left perihilar streaky opacities, atelectasis versus pneumonia. Electronically Signed   By: Rubye OaksMelanie  Ehinger M.D.   On: 12/29/2016 05:38    Microbiology: Recent Results (from the past 240 hour(s))  Culture, blood (Routine x 2)     Status: Abnormal   Collection Time: 12/28/16  3:10 PM  Result Value Ref Range Status   Specimen Description BLOOD LEFT ARM  Final   Special Requests BOTTLES DRAWN AEROBIC AND ANAEROBIC  Final   Culture  Setup Time   Final    GRAM NEGATIVE RODS AEROBIC BOTTLE ONLY CRITICAL RESULT CALLED TO, READ BACK BY AND VERIFIED WITH: JULIAN GRIMSLEY,PHARMD @0153  12/31/16 MKELLY,MLT Performed at [facility], [address], [place], [place] [address]    Culture ESCHERICHIA COLI (A)  Final   Report Status 01/02/2017 FINAL  Final   Organism ID, Bacteria ESCHERICHIA COLI  Final      Susceptibility   Escherichia coli - MIC*    AMPICILLIN >=32 RESISTANT Resistant     CEFAZOLIN 8 SENSITIVE Sensitive     CEFEPIME <=1 SENSITIVE Sensitive     CEFTAZIDIME  <=1 SENSITIVE Sensitive     CEFTRIAXONE <=1 SENSITIVE Sensitive     CIPROFLOXACIN <=0.25 SENSITIVE Sensitive     GENTAMICIN >=16 RESISTANT Resistant     IMIPENEM <=0.25 SENSITIVE Sensitive     TRIMETH/SULFA >=320 RESISTANT Resistant     AMPICILLIN/SULBACTAM >=32 RESISTANT Resistant     PIP/TAZO <=4 SENSITIVE Sensitive     Extended ESBL NEGATIVE Sensitive     * ESCHERICHIA COLI  Blood Culture ID Panel (Reflexed)     Status: Abnormal   Collection Time: 12/28/16  3:10 PM  Result Value Ref Range Status   Enterococcus species NOT DETECTED NOT DETECTED Final   Listeria monocytogenes NOT DETECTED NOT DETECTED Final   Staphylococcus species NOT DETECTED NOT DETECTED Final   Staphylococcus aureus NOT DETECTED NOT DETECTED Final   Streptococcus species NOT DETECTED NOT DETECTED Final   Streptococcus agalactiae NOT DETECTED NOT DETECTED Final   Streptococcus pneumoniae NOT DETECTED NOT DETECTED Final   Streptococcus pyogenes NOT DETECTED NOT DETECTED Final   Acinetobacter baumannii NOT DETECTED NOT DETECTED Final   Enterobacteriaceae species DETECTED (A) NOT DETECTED Final    Comment: CRITICAL RESULT CALLED TO, READ BACK BY AND VERIFIED WITH: JULIAN GRIMSLEY,PHARMD @0153  12/31/16 MKELLY,MLT    Enterobacter cloacae complex NOT DETECTED NOT DETECTED Final   Escherichia coli DETECTED (A) NOT DETECTED Final    Comment: CRITICAL RESULT CALLED TO, READ BACK BY AND VERIFIED WITH: JULIAN GRIMSLEY,PHARMD @0153  12/31/16 MKELLY,MLT    Klebsiella oxytoca NOT DETECTED NOT DETECTED Final   Klebsiella pneumoniae NOT DETECTED NOT DETECTED Final   Proteus species NOT DETECTED NOT DETECTED Final   Serratia marcescens NOT DETECTED NOT DETECTED Final   Carbapenem resistance NOT DETECTED NOT DETECTED Final   Haemophilus influenzae NOT DETECTED NOT DETECTED Final   Neisseria meningitidis NOT DETECTED NOT DETECTED Final   Pseudomonas aeruginosa NOT DETECTED NOT DETECTED Final   Candida albicans NOT DETECTED NOT  DETECTED Final   Candida glabrata NOT DETECTED NOT DETECTED Final   Candida krusei NOT DETECTED NOT DETECTED Final   Candida parapsilosis NOT DETECTED NOT DETECTED Final   Candida tropicalis NOT DETECTED NOT DETECTED Final  Urine culture     Status: Abnormal   Collection Time: 12/28/16  4:07 PM  Result Value Ref Range Status   Specimen Description URINE, RANDOM  Final   Special Requests NONE  Final   Culture >=100,000 COLONIES/mL ESCHERICHIA COLI (A)  Final   Report Status 12/30/2016 FINAL  Final   Organism ID, Bacteria ESCHERICHIA COLI (A)  Final      Susceptibility   Escherichia coli - MIC*    AMPICILLIN >=32 RESISTANT Resistant     CEFAZOLIN 16 SENSITIVE Sensitive  CEFTRIAXONE <=1 SENSITIVE Sensitive     CIPROFLOXACIN <=0.25 SENSITIVE Sensitive     GENTAMICIN >=16 RESISTANT Resistant     IMIPENEM <=0.25 SENSITIVE Sensitive     NITROFURANTOIN <=16 SENSITIVE Sensitive     TRIMETH/SULFA >=320 RESISTANT Resistant     AMPICILLIN/SULBACTAM >=32 RESISTANT Resistant     PIP/TAZO 8 SENSITIVE Sensitive     Extended ESBL NEGATIVE Sensitive     * >=100,000 COLONIES/mL ESCHERICHIA COLI  Culture, blood (Routine x 2)     Status: None (Preliminary result)   Collection Time: 12/28/16  4:15 PM  Result Value Ref Range Status   Specimen Description BLOOD BLOOD RIGHT FOREARM  Final   Special Requests IN PEDIATRIC BOTTLE 1ml  Final   Culture   Final    NO GROWTH 4 DAYS Performed at [facility], [address] [place], [place] [address]    Report Status PENDING  Incomplete  MRSA PCR Screening     Status: None   Collection Time: 12/28/16  6:48 PM  Result Value Ref Range Status   MRSA by PCR NEGATIVE NEGATIVE Final    Comment:        The GeneXpert MRSA Assay (FDA approved for NASAL specimens only), is one component of a comprehensive MRSA colonization surveillance program. It is not intended to diagnose MRSA infection nor to guide or monitor treatment for MRSA infections.   C  difficile quick scan w PCR reflex     Status: None   Collection Time: 12/30/16 11:43 AM  Result Value Ref Range Status   C Diff antigen NEGATIVE NEGATIVE Final   C Diff toxin NEGATIVE NEGATIVE Final   C Diff interpretation No C. difficile detected.  Final     Labs: Basic Metabolic Panel:  Recent Labs Lab 12/29/16 0142 12/30/16 0328 12/31/16 0323 01/01/17 0235 01/02/17 0232  NA 136 132* 134* 136 136  K 4.4 3.4* 4.0 3.6 4.1  CL 106 102 105 107 105  CO2 24 22 21* 20* 23  GLUCOSE 86 153* 145* 145* 162*  BUN 22* 10 9 6  <5*  CREATININE 0.73 0.58 0.56 0.58 0.61  CALCIUM 7.7* 7.5* 7.9* 7.9* 8.3*   Liver Function Tests:  Recent Labs Lab 12/28/16 1510 12/29/16 0142 12/30/16 0328 12/31/16 0323  AST 58* 54* 40 29  ALT 35 34 34 29  ALKPHOS 57 46 48 40  BILITOT 0.5 0.5 0.3 0.2*  PROT 7.1 6.9 6.8 6.4*  ALBUMIN 3.6 3.1* 3.2* 3.0*   No results for input(s): LIPASE, AMYLASE in the last 168 hours. No results for input(s): AMMONIA in the last 168 hours. CBC:  Recent Labs Lab 12/28/16 1510 12/29/16 0142 12/30/16 0328 12/31/16 0323 01/01/17 0235 01/02/17 0232  WBC 11.3* 12.5* 8.2 6.1 6.8 8.2  NEUTROABS 10.4*  --   --   --   --   --   HGB 16.9* 9.3* 9.3* 9.2* 8.8* 8.8*  HCT 47.6* 27.3* 27.7* 26.7* 26.6* 26.8*  MCV 77.7* 78.0 77.6* 76.9* 78.2 78.8  PLT 128* 208 221 252 277 305   Cardiac Enzymes:  Recent Labs Lab 12/28/16 1709 12/28/16 1903 12/29/16 0052 12/29/16 0713  TROPONINI 4.87* 4.72* 2.61* 1.90*    CBG:  Recent Labs Lab 01/01/17 1116 01/01/17 1727 01/01/17 2224 01/02/17 0706 01/02/17 1217  GLUCAP 162* 118* 145* 153* 166*       Signed:  Mauro Kaufmann S MD.  [facility] 01/02/2017, 3:48 PM

## 2017-01-02 NOTE — Progress Notes (Signed)
Progress Note  Patient Name: Amy Hood Date of Encounter: 01/02/2017  Primary Cardiologist: Dr. Diona Browner (new)  Subjective   R shoulder pain is a little better.  She denies chest pain or shortness of breath.   Inpatient Medications    Scheduled Meds: . aspirin EC  81 mg Oral Daily  . azithromycin  500 mg Oral Q24H  . carvedilol  3.125 mg Oral BID WC  . cefTRIAXone (ROCEPHIN)  IV  2 g Intravenous Q24H  . insulin aspart  0-15 Units Subcutaneous TID WC  . insulin aspart  3 Units Subcutaneous TID WC  . irbesartan  75 mg Oral Daily  . rosuvastatin  20 mg Oral q1800  . sodium chloride flush  3 mL Intravenous Q12H  . ticagrelor  90 mg Oral BID   Continuous Infusions:  PRN Meds: sodium chloride, acetaminophen, albuterol, HYDROcodone-acetaminophen, morphine injection, nitroGLYCERIN, ondansetron (ZOFRAN) IV, sodium chloride flush   Vital Signs    Vitals:   01/01/17 1600 01/01/17 2016 01/02/17 0451 01/02/17 0800  BP: (!) 142/57 103/71 (!) 167/63 (!) 135/99  Pulse: 63 70 68   Resp: 20 15 (!) 22   Temp: 98.6 F (37 C) 98.5 F (36.9 C) 98.4 F (36.9 C) 97.8 F (36.6 C)  TempSrc: Oral Oral Oral Oral  SpO2: 95% 95% 98% 98%  Weight:   78 kg (171 lb 15.3 oz)   Height:        Intake/Output Summary (Last 24 hours) at 01/02/17 0823 Last data filed at 01/02/17 0650  Gross per 24 hour  Intake              840 ml  Output                0 ml  Net              840 ml   Filed Weights   12/31/16 0434 01/01/17 0242 01/02/17 0451  Weight: 82 kg (180 lb 12.4 oz) 77.2 kg (170 lb 3.1 oz) 78 kg (171 lb 15.3 oz)    Telemetry    Sinus rhythm.  PVCs.  - Personally Reviewed  ECG    12/30/16: Sinus rhythm.  Rate 76 bpm.  Prior inferior infarct.  Anterolateral TWI.   - Personally Reviewed  Physical Exam   GEN: No acute distress.  Neck: No JVD Cardiac: RRR.  No murmurs, rubs, or gallops.  Respiratory: Scattered rhonchi bilaterally  GI: Soft, nontender, non-distended  MS: R  shoulder tender to palpations.  No limitation in ROM.  No edema; No deformity. Neuro:  AAOx3. Psych: Normal affect  Labs    Chemistry  Recent Labs Lab 12/29/16 0142 12/30/16 0328 12/31/16 0323 01/01/17 0235 01/02/17 0232  NA 136 132* 134* 136 136  K 4.4 3.4* 4.0 3.6 4.1  CL 106 102 105 107 105  CO2 24 22 21* 20* 23  GLUCOSE 86 153* 145* 145* 162*  BUN 22* 10 9 6  <5*  CREATININE 0.73 0.58 0.56 0.58 0.61  CALCIUM 7.7* 7.5* 7.9* 7.9* 8.3*  PROT 6.9 6.8 6.4*  --   --   ALBUMIN 3.1* 3.2* 3.0*  --   --   AST 54* 40 29  --   --   ALT 34 34 29  --   --   ALKPHOS 46 48 40  --   --   BILITOT 0.5 0.3 0.2*  --   --   GFRNONAA >60 >60 >60 >60 >60  GFRAA >60 >60 >  60 >60 >60  ANIONGAP 6 8 8 9 8      Hematology  Recent Labs Lab 12/31/16 0323 01/01/17 0235 01/02/17 0232  WBC 6.1 6.8 8.2  RBC 3.47* 3.40* 3.40*  HGB 9.2* 8.8* 8.8*  HCT 26.7* 26.6* 26.8*  MCV 76.9* 78.2 78.8  MCH 26.5 25.9* 25.9*  MCHC 34.5 33.1 32.8  RDW 13.5 13.4 13.4  PLT 252 277 305    Cardiac Enzymes  Recent Labs Lab 12/28/16 1709 12/28/16 1903 12/29/16 0052 12/29/16 0713  TROPONINI 4.87* 4.72* 2.61* 1.90*     Recent Labs Lab 12/28/16 1610 12/28/16 1625  TROPIPOC 5.01* 4.16*     BNPNo results for input(s): BNP, PROBNP in the last 168 hours.   DDimer No results for input(s): DDIMER in the last 168 hours.   Radiology    Dg Shoulder Right  Result Date: 01/01/2017 CLINICAL DATA:  Right shoulder generalized pain, no known injury EXAM: RIGHT SHOULDER - 2+ VIEW COMPARISON:  None. FINDINGS: Three views of the right shoulder submitted. No acute fracture or subluxation. Mild degenerative changes AC joint. Mild spurring of acromion. Glenohumeral joint is preserved. Tiny calcification adjacent of the humeral head probable calcific tendinosis. IMPRESSION: No acute fracture or subluxation. Mild degenerative changes AC joint. Mild spurring of acromion. Glenohumeral joint is preserved. Tiny calcification  adjacent of the humeral head probable calcific tendinosis. Electronically Signed   By: Natasha MeadLiviu  Pop M.D.   On: 01/01/2017 10:11   Mr Shoulder Right Wo Contrast  Result Date: 01/01/2017 CLINICAL DATA:  Right shoulder pain. EXAM: MRI OF THE RIGHT SHOULDER WITHOUT CONTRAST TECHNIQUE: Multiplanar, multisequence MR imaging of the shoulder was performed. No intravenous contrast was administered. COMPARISON:  None. FINDINGS: Rotator cuff: Severe tendinosis of the supraspinatus tendon with a small partial-thickness bursal surface tear of the posterior fibers. Moderate tendinosis of the infraspinatus tendon. Teres minor tendon is intact. Subscapularis tendon is intact. Muscles: No atrophy or fatty replacement of the muscles of the rotator cuff. Mild muscle edema in the teres minor muscle likely reflecting mild muscle strain. Biceps long head: Moderate tendinosis of the intraarticular portion of the long head of the biceps tendon. Acromioclavicular Joint: Mild arthropathy of the acromioclavicular joint. Type II acromion. Small amount of subacromial/subdeltoid bursal fluid. Glenohumeral Joint: No joint effusion.  No chondral defect. Labrum: Grossly intact, but evaluation is limited by lack of intraarticular fluid. Bones:  No marrow signal abnormality.  No fracture or dislocation. Other: No fluid collection or hematoma. IMPRESSION: 1. Severe tendinosis of the supraspinatus tendon with a small partial-thickness bursal surface tear of the posterior fibers. 2. Moderate tendinosis of the infraspinatus tendon. 3. Moderate tendinosis of the intraarticular portion of the long head of the biceps tendon. 4. Mild subacromial/subdeltoid bursitis. Electronically Signed   By: Elige KoHetal  Patel   On: 01/01/2017 21:17    Cardiac Studies   Echo 12/29/16: Study Conclusions  - Left ventricle: The cavity size was normal. Wall thickness was   increased in a pattern of mild LVH. Systolic function was normal.   The estimated ejection fraction  was in the range of 55% to 60%.   There is hypokinesis of the apicalinferior and apical myocardium.   Doppler parameters are consistent with abnormal left ventricular   relaxation (grade 1 diastolic dysfunction). - Aortic valve: Mildly calcified annulus. Trileaflet; mildly   calcified leaflets. Mean gradient (S): 13 mm Hg. Peak gradient   (S): 21 mm Hg. VTI ratio of LVOT to aortic valve: 0.56. - Mitral valve: Calcified annulus.  There was trivial regurgitation. - Right ventricle: The cavity size was mildly dilated. Systolic   function was reduced. - Right atrium: Central venous pressure (est): 15 mm Hg. - Tricuspid valve: There was trivial regurgitation. - Pulmonary arteries: PA peak pressure: 35 mm Hg (S). - Pericardium, extracardiac: There was no pericardial effusion.  Impressions:  - Mild LVH with LVEF 55-60%. There is hypokinesis of the apical   inferior and apical myocardium. Grade 1 diastolic dysfunction.   Calcified mitral annulus with trivial mitral regurgitation.   Moderately sclerotic aortic valve without obvious stenosis.   Limited views of the right ventricle suggest at least mild   dilatation and significantly reduced contraction. Trivial   tricuspid regurgitation with PASP 35 mmHg and estimated elevated   CVP.  LHC 12/31/16:  Mid RCA lesion, 25 %stenosed.  Ost LAD to Prox LAD lesion, 40 %stenosed.  LV end diastolic pressure is normal.  Mid LAD lesion, 80 %stenosed. A STENT SYNERGY DES 3X24 drug eluting stent was successfully placed, postdilated to 3.25.  Post intervention, there is a 0% residual stenosis.  Prox Cx to Mid Cx lesion, 75 %stenosed. A STENT SYNERGY DES 3.5X28 drug eluting stent was successfully placed.  Post intervention, there is a 0% residual stenosis.  Patient Profile     76 y.o. female with mild aortic stenosis, hypertension, hyperlipidemia, here with NSTEMI in the setting on community-acquired pneumonia and UTI.  Assessment & Plan    #  NSTEMI: Troponin elevated to 5.  She was found to have severe stenosis of the LAD and LCx with only a 25% RCA lesion.  She underwent successful PCI of the LAD and LCx arteries.  She was started on aspirin and ticagrelor.  Continue aspirin and ticagrelor.  Heart rates are in the 60s after adding low dose carvedilol.  BP remains elevate so we will increase carvedilol to 6.25 mg bid.  # Paroxysmal atrial fibrillation:  Ms. Eklund had paroxysmal atrial fibrillation in the setting of infection.  This has resolved.  Given that she was started on aspirin and ticagrelor, we will not start anticoagulation at this time.   Rates in atrial fibrillation were well-controlled. Carvedilol as above.  If she has recurrent atrial fibrillation we would switch to warfarin and clopidogrel.  This patients CHA2DS2-VASc Score and unadjusted Ischemic Stroke Rate (% per year) is equal to 7.2 % stroke rate/year from a score of 5  Above score calculated as 1 point each if present [CHF, HTN, DM, Vascular=MI/PAD/Aortic Plaque, Age if 65-74, or Female] Above score calculated as 2 points each if present [Age > 75, or Stroke/TIA/TE]  # Hypertension: Continue irbesartan and increase carvedilol as above.  BP above goal.  # Hyperlipidemia:  Ms. Suchecki is on lovastatin as an outpatient. LDL 52.  This was switched to rosuvastatin 20mg .  Repeat lipids and CMP in 6 weeks.   # CAP: # UTI: Antibiotics per IM     Signed, Chilton Si, MD  01/02/2017, 8:23 AM

## 2017-01-02 NOTE — Progress Notes (Signed)
CARDIAC REHAB PHASE I   PRE:  Rate/Rhythm: 65 SR    BP: sitting 94/40    SaO2:   MODE:  Ambulation: 250 ft   POST:  Rate/Rhythm: 80 SR    BP: sitting 92/35     SaO2:   Tolerated well, no c/o with RW, which she uses at home. Ed completed with granddaughter who translated some information but she feels pt does not receive information well and that she will go over it at home with pt and family. I gave written copies in English of walking gl, NTG, and CRPII. Requested referral be sent to [facility] CRPII however she will not be interested in CRPII. [phone]   Harriet MassonRandi Kristan Faryn Sieg CES, ACSM 01/02/2017 10:57 AM

## 2017-01-03 LAB — CULTURE, BLOOD (ROUTINE X 2): Culture: NO GROWTH

## 2017-01-03 NOTE — Telephone Encounter (Signed)
TOC call to patient no answer.Left message on cell # [phone] patient's daughter to call office to speak to triage nurse.Pt speaks no english.

## 2017-01-07 NOTE — Telephone Encounter (Signed)
Spoke with son-in-law states this is his cell and to call daughter(pt's granddaughter-Thao) at [phone].  Lmtcb fot TCM call-appt with Barrett 01-13-17

## 2017-01-07 NOTE — Telephone Encounter (Signed)
[email or web address]  

## 2017-01-09 NOTE — Telephone Encounter (Signed)
[email or web address]  

## 2017-01-09 NOTE — Telephone Encounter (Signed)
Unable to contact pt-closing message

## 2017-01-13 ENCOUNTER — Ambulatory Visit: Payer: Medicare Other | Admitting: Physician Assistant

## 2017-01-13 NOTE — Progress Notes (Deleted)
Cardiology Office Note   Date:  01/13/2017   ID:  Amy Hood, DOB [birth year], MRN [id number]  PCP:  Georgann Housekeeper, MD  Cardiologist:  Dr Verne Carrow, PA-C   No chief complaint on file.   History of Present Illness: Amy Hood is a 76 y.o. female with a history of mild AS, HTN, DM, OA, HLD  Admit 1/20-1/25 with non-STEMI, DES to the LAD, EF preserved with wall motion abnormality on echo  Amy Hood presents for ***   Past Medical History:  Diagnosis Date  . Arthritis   . Cataracts, bilateral   . Essential hypertension   . Hyperlipidemia   . Mild aortic stenosis 12/30/2016  . Type 2 diabetes mellitus (HCC)     Past Surgical History:  Procedure Laterality Date  . CARDIAC CATHETERIZATION N/A 12/31/2016   Procedure: Left Heart Cath and Coronary Angiography;  Surgeon: Corky Crafts, MD;  Location: [facility] INVASIVE CV LAB;  Service: Cardiovascular;  Laterality: N/A;  . CARDIAC CATHETERIZATION N/A 12/31/2016   Procedure: Coronary Stent Intervention;  Surgeon: Corky Crafts, MD;  Location: [facility] INVASIVE CV LAB;  Service: Cardiovascular;  Laterality: N/A;  . KYPHOPLASTY      Current Outpatient Prescriptions  Medication Sig Dispense Refill  . alendronate (FOSAMAX) 70 MG tablet Take 70 mg by mouth every 7 (seven) days. Take with a full glass of water on an empty stomach.    Marland Kitchen aspirin 81 MG chewable tablet Chew 81 mg by mouth daily.    . carvedilol (COREG) 6.25 MG tablet Take 1 tablet (6.25 mg total) by mouth 2 (two) times daily with a meal. 60 tablet 2  . cholecalciferol 4000 units TABS Take 4,000 Units by mouth daily. 30 tablet 3  . ciprofloxacin (CIPRO) 500 MG tablet Take 1 tablet (500 mg total) by mouth 2 (two) times daily. 16 tablet 0  . glimepiride (AMARYL) 4 MG tablet Take 4 mg by mouth daily before breakfast.    . HYDROcodone-acetaminophen (NORCO/VICODIN) 5-325 MG tablet Take 1 tablet by mouth every 6 (six) hours as needed. 20 tablet 0  . iron  polysaccharides (NIFEREX) 150 MG capsule Take 1 capsule (150 mg total) by mouth daily. 30 capsule 3  . Oyster Shell (OYSTER CALCIUM) 500 MG TABS tablet Take 500 mg of elemental calcium by mouth 2 (two) times daily.    . pioglitazone (ACTOS) 45 MG tablet Take 45 mg by mouth daily.    . rosuvastatin (CRESTOR) 20 MG tablet Take 1 tablet (20 mg total) by mouth daily at 6 PM. 30 tablet 2  . ticagrelor (BRILINTA) 90 MG TABS tablet Take 1 tablet (90 mg total) by mouth 2 (two) times daily. 60 tablet 3  . valsartan (DIOVAN) 160 MG tablet Take 80 mg by mouth daily.     No current facility-administered medications for this visit.     Allergies:   Patient has no known allergies.    Social History:  The patient  reports that she has never smoked. She has never used smokeless tobacco. She reports that she does not drink alcohol or use drugs.   Family History:  The patient's family history includes Diabetes in her mother.    ROS:  Please see the history of present illness. All other systems are reviewed and negative.    PHYSICAL EXAM: VS:  There were no vitals taken for this visit. , BMI There is no height or weight on file to calculate BMI. GEN: Well nourished,  well developed, female in no acute distress  HEENT: normal for age  Neck: no JVD, no carotid bruit, no masses Cardiac: RRR; no murmur, no rubs, or gallops Respiratory:  clear to auscultation bilaterally, normal work of breathing GI: soft, nontender, nondistended, + BS MS: no deformity or atrophy; no edema; distal pulses are 2+ in all 4 extremities   Skin: warm and dry, no rash Neuro:  Strength and sensation are intact Psych: euthymic mood, full affect   EKG:  EKG {ACTION; IS/IS [id number]} ordered today. The ekg ordered today demonstrates ***  Echo 12/29/16: Study Conclusions - Left ventricle: The cavity size was normal. Wall thickness was increased in a pattern of mild LVH. Systolic function was normal. The estimated  ejection fraction was in the range of 55% to 60%. There is hypokinesis of the apicalinferior and apical myocardium. Doppler parameters are consistent with abnormal left ventricular relaxation (grade 1 diastolic dysfunction). - Aortic valve: Mildly calcified annulus. Trileaflet; mildly calcified leaflets. Mean gradient (S): 13 mm Hg. Peak gradient (S): 21 mm Hg. VTI ratio of LVOT to aortic valve: 0.56. - Mitral valve: Calcified annulus. There was trivial regurgitation. - Right ventricle: The cavity size was mildly dilated. Systolic function was reduced. - Right atrium: Central venous pressure (est): 15 mm Hg. - Tricuspid valve: There was trivial regurgitation. - Pulmonary arteries: PA peak pressure: 35 mm Hg (S). - Pericardium, extracardiac: There was no pericardial effusion. Impressions: - Mild LVH with LVEF 55-60%. There is hypokinesis of the apical inferior and apical myocardium. Grade 1 diastolic dysfunction. Calcified mitral annulus with trivial mitral regurgitation. Moderately sclerotic aortic valve without obvious stenosis. Limited views of the right ventricle suggest at least mild dilatation and significantly reduced contraction. Trivial tricuspid regurgitation with PASP 35 mmHg and estimated elevated CVP.  LHC 12/31/16:  Mid RCA lesion, 25 %stenosed.  Ost LAD to Prox LAD lesion, 40 %stenosed.  LV end diastolic pressure is normal.  Mid LAD lesion, 80 %stenosed. A STENT SYNERGY DES 3X24 drug eluting stent was successfully placed, postdilated to 3.25.  Post intervention, there is a 0% residual stenosis.  Prox Cx to Mid Cx lesion, 75 %stenosed. A STENT SYNERGY DES 3.5X28 drug eluting stent was successfully placed.  Post intervention, there is a 0% residual stenosis.  Recent Labs: 12/28/2016: TSH 0.497 12/31/2016: ALT 29 01/02/2017: BUN <5; Creatinine, Ser 0.61; Hemoglobin 8.8; Platelets 305; Potassium 4.1; Sodium 136    Lipid Panel      Component Value Date/Time   CHOL 116 12/31/2016 0323   TRIG 117 12/31/2016 0323   HDL 41 12/31/2016 0323   CHOLHDL 2.8 12/31/2016 0323   VLDL 23 12/31/2016 0323   LDLCALC 52 12/31/2016 0323     Wt Readings from Last 3 Encounters:  01/02/17 171 lb 15.3 oz (78 kg)  06/19/15 140 lb (63.5 kg)  09/21/13 153 lb 10.6 oz (69.7 kg)     Other studies Reviewed: Additional studies/ records that were reviewed today include: ***.  ASSESSMENT AND PLAN:  1.  ***   Current medicines are reviewed at length with the patient today.  The patient {ACTIONS; HAS/DOES NOT HAVE:19233} concerns regarding medicines.  The following changes have been made:  {PLAN; NO CHANGE:13088:s}  Labs/ tests ordered today include: *** No orders of the defined types were placed in this encounter.    Disposition:   FU with ***  Signed, Theodore DemarkBarrett, Rhonda, PA-C  01/13/2017 7:43 AM    [facility] Phone: [phone]; Fax: [phone]  This note was written with the assistance of speech recognition software. Please excuse any transcriptional errors.

## 2017-04-16 ENCOUNTER — Other Ambulatory Visit: Payer: Self-pay | Admitting: Internal Medicine

## 2017-04-16 DIAGNOSIS — Z1231 Encounter for screening mammogram for malignant neoplasm of breast: Secondary | ICD-10-CM

## 2017-04-22 ENCOUNTER — Ambulatory Visit: Payer: Medicare Other | Admitting: Physician Assistant

## 2017-04-27 DIAGNOSIS — E119 Type 2 diabetes mellitus without complications: Secondary | ICD-10-CM | POA: Insufficient documentation

## 2017-04-27 NOTE — Progress Notes (Signed)
Cardiology Office Note:    Date:  04/28/2017   ID:  Amy Hood, DOB [birth year], MRN [id number]  PCP:  Wenda Low, MD  Cardiologist:  Dr. Skeet Latch    Referring MD: Wenda Low, MD   Chief Complaint  Patient presents with  . Coronary Artery Disease    follow up   History of Present Illness:    Amy Hood is a 76 y.o. [place] female with a hx of DM2, HTN, HL.  She was admitted in 12/2016 with community-acquired pneumonia and UTI.  She also ruled in for NSTEMI.  Echo demonstrated normal ejection fraction, mild diastolic dysfunction, mild aortic stenosis.  LHC demonstrated severe mLAD stenosis and severe pLCx stenosis.  Both lesions were treated with a DES.  Hospital stay was complicated by PAF.  Since atrial fibrillation occurred in the context of an acute illness (pneumonia/UTI) and she required Rx with ASA and Ticagrelor, anticoagulation was not started.  CHADS2-VASc=5 (female, 76 yo, HTN, DM).  If atrial fibrillation recurs, plan is to switch to Warfarin and Clopidogrel.   She has not been seen by Cardiology since DC.    Amy Hood presents for Cardiology follow up.  She is here with her granddaughter who helps with interpretation (patient could not hear portable interpretation device).  Amy Hood denies chest pain, shortness of breath, syncope, orthopnea, PND.  She has mild edema.  She denies any bleeding issues.  She has been adherent to her medical Rx.   Prior CV studies:   The following studies were reviewed today:  LHC 12/31/16 LAD ost 40, mid 80 LCx prox 75 RCA mid 25 PCI:  3 x 24 mm Synergy DES to mid LAD; 3.5 x 28 mm Synergy DES to proximal LCx  Post-Intervention Diagram    Echo 12/29/16 Mild LVH, EF 55-60, apical inf and apical HK, Gr 1 DD, AV mean 13, MAC, trivial MR, reduced RVSF, trivial TR, PASP 35, no eff   Past Medical History:  Diagnosis Date  . Arthritis   . Cataracts, bilateral   . Essential hypertension   . Hyperlipidemia   . Mild  aortic stenosis 12/30/2016  . Type 2 diabetes mellitus ([place])     Past Surgical History:  Procedure Laterality Date  . CARDIAC CATHETERIZATION N/A 12/31/2016   Procedure: Left Heart Cath and Coronary Angiography;  Surgeon: Jettie Booze, MD;  Location: [facility];  Service: Cardiovascular;  Laterality: N/A;  . CARDIAC CATHETERIZATION N/A 12/31/2016   Procedure: Coronary Stent Intervention;  Surgeon: Jettie Booze, MD;  Location: [facility];  Service: Cardiovascular;  Laterality: N/A;  . KYPHOPLASTY      Current Medications: Current Meds  Medication Sig  . alendronate (FOSAMAX) 70 MG tablet Take 70 mg by mouth every 7 (seven) days. Take with a full glass of water on an empty stomach.  Marland Kitchen aspirin 81 MG chewable tablet Chew 81 mg by mouth daily.  . carvedilol (COREG) 6.25 MG tablet Take 1 tablet (6.25 mg total) by mouth 2 (two) times daily with a meal.  . cholecalciferol 4000 units TABS Take 4,000 Units by mouth daily.  Marland Kitchen glimepiride (AMARYL) 4 MG tablet Take 4 mg by mouth daily before breakfast.  . HYDROcodone-acetaminophen (NORCO/VICODIN) 5-325 MG tablet Take 1 tablet by mouth every 6 (six) hours as needed.  . iron polysaccharides (NIFEREX) 150 MG capsule Take 1 capsule (150 mg total) by mouth daily.  Loma Boston (OYSTER CALCIUM) 500 MG TABS tablet Take 500  mg of elemental calcium by mouth 2 (two) times daily.  . pioglitazone (ACTOS) 45 MG tablet Take 45 mg by mouth daily.  . rosuvastatin (CRESTOR) 20 MG tablet Take 1 tablet (20 mg total) by mouth daily at 6 PM.  . ticagrelor (BRILINTA) 90 MG TABS tablet Take 1 tablet (90 mg total) by mouth 2 (two) times daily.  . valsartan (DIOVAN) 160 MG tablet Take 80 mg by mouth daily.     Allergies:   Patient has no known allergies.   Social History   Social History  . Marital status: Married    Spouse name: N/A  . Number of children: N/A  . Years of education: N/A   Social History Main Topics  . Smoking status:  Never Smoker  . Smokeless tobacco: Never Used  . Alcohol use No  . Drug use: No  . Sexual activity: No   Other Topics Concern  . None   Social History Narrative  . None     Family Hx: The patient's family history includes Diabetes in her mother.  ROS:   Please see the history of present illness.    ROS All other systems reviewed and are negative.   EKGs/Labs/Other Test Reviewed:    EKG:  EKG is  ordered today.  The ekg ordered today demonstrates sinus bradycardia, HR 59, PVC, 1st degree AVB (PR 210 ms), QTc 439 ms  Recent Labs: 12/28/2016: TSH 0.497 12/31/2016: ALT 29 01/02/2017: BUN <5; Creatinine, Ser 0.61; Hemoglobin 8.8; Platelets 305; Potassium 4.1; Sodium 136   Recent Lipid Panel    Component Value Date/Time   CHOL 116 12/31/2016 0323   TRIG 117 12/31/2016 0323   HDL 41 12/31/2016 0323   CHOLHDL 2.8 12/31/2016 0323   VLDL 23 12/31/2016 0323   LDLCALC 52 12/31/2016 0323     Physical Exam:    VS:  BP 140/60   Pulse (!) 59   Ht 5' 2"  (1.575 m)   Wt 169 lb 12.8 oz (77 kg)   SpO2 96%   BMI 31.06 kg/m     Wt Readings from Last 3 Encounters:  04/28/17 169 lb 12.8 oz (77 kg)  01/02/17 171 lb 15.3 oz (78 kg)  06/19/15 140 lb (63.5 kg)     Physical Exam  Constitutional: She is oriented to person, place, and time. She appears well-developed and well-nourished. No distress.  HENT:  Head: Normocephalic and atraumatic.  Eyes: No scleral icterus.  Neck: Normal range of motion. No JVD present.  Cardiovascular: Normal rate, regular rhythm, S1 normal and S2 normal.   Murmur heard.  Systolic murmur is present with a grade of 2/6  at the upper right sternal border Pulmonary/Chest: Breath sounds normal. She has no wheezes. She has no rhonchi. She has no rales.  Abdominal: Soft. There is no tenderness.  Musculoskeletal: She exhibits edema (trace bilat LE edema).  Neurological: She is alert and oriented to person, place, and time.  Skin: Skin is warm and dry.    Psychiatric: She has a normal mood and affect.    ASSESSMENT:    1. Coronary artery disease involving native coronary artery of native heart without angina pectoris   2. PAF (paroxysmal atrial fibrillation) ([place])   3. Mild aortic stenosis   4. Essential hypertension   5. Pure hypercholesterolemia   6. Type 2 diabetes mellitus without complication, without long-term current use of insulin (HCC)    PLAN:    In order of problems listed above:  1. Coronary  artery disease involving native coronary artery of native heart without angina pectoris -  S/p Non-STEMI 1/18 treated with DES to the mid LAD and DES to the proximal LCx. She denies anginal symptoms. She understands the importance of dual antiplatelet therapy. She is not interested in cardiac rehabilitation.  -  Continue aspirin, Ticagrelor, carvedilol, rosuvastatin.  2. PAF (paroxysmal atrial fibrillation) ([place]) -  This occurred in the setting of acute illness. She is currently maintaining normal sinus rhythm. If she has recurrent atrial fibrillation, she will need long-term anticoagulation given her significant thromboembolic risk profile.  3. Mild aortic stenosis - Mild by recent echocardiogram. Consider repeat echocardiogram in 1-2 years.  4. Essential hypertension - The patient's blood pressure is controlled on her current regimen.  Continue current therapy.    5. Pure hypercholesterolemia - Managed by PCP.  LDL optimal on most recent lab work.  Continue current Rx.    6. Type 2 diabetes mellitus without complication, without long-term current use of insulin ([place]) - Continue follow up with PCP.   Dispo:  Return in about 6 months (around 10/29/2017) for Routine Follow Up with Dr. Oval Linsey .   Medication Adjustments/Labs and Tests Ordered: Current medicines are reviewed at length with the patient today.  Concerns regarding medicines are outlined above.  Orders/Tests:  Orders Placed This Encounter  Procedures  . EKG  12-Lead   Medication changes: No orders of the defined types were placed in this encounter.  Signed, Richardson Dopp, PA-C  04/28/2017 9:20 AM    [facility] [address], [place], [place]  [address] Phone: [phone]; Fax: [phone]

## 2017-04-28 ENCOUNTER — Encounter: Payer: Self-pay | Admitting: Physician Assistant

## 2017-04-28 ENCOUNTER — Ambulatory Visit (INDEPENDENT_AMBULATORY_CARE_PROVIDER_SITE_OTHER): Payer: Medicare Other | Admitting: Physician Assistant

## 2017-04-28 ENCOUNTER — Encounter (INDEPENDENT_AMBULATORY_CARE_PROVIDER_SITE_OTHER): Payer: Self-pay

## 2017-04-28 VITALS — BP 140/60 | HR 59 | Ht 62.0 in | Wt 169.8 lb

## 2017-04-28 DIAGNOSIS — I10 Essential (primary) hypertension: Secondary | ICD-10-CM

## 2017-04-28 DIAGNOSIS — I2510 Atherosclerotic heart disease of native coronary artery without angina pectoris: Secondary | ICD-10-CM | POA: Diagnosis not present

## 2017-04-28 DIAGNOSIS — E119 Type 2 diabetes mellitus without complications: Secondary | ICD-10-CM | POA: Diagnosis not present

## 2017-04-28 DIAGNOSIS — I480 Paroxysmal atrial fibrillation: Secondary | ICD-10-CM | POA: Diagnosis not present

## 2017-04-28 DIAGNOSIS — E7800 Pure hypercholesterolemia, unspecified: Secondary | ICD-10-CM

## 2017-04-28 DIAGNOSIS — I350 Nonrheumatic aortic (valve) stenosis: Secondary | ICD-10-CM

## 2017-04-28 NOTE — Patient Instructions (Signed)
Medication Instructions:  Your physician recommends that you continue on your current medications as directed. Please refer to the Current Medication list given to you today.   Labwork: NONE ORDERED  Testing/Procedures: NONE ORDERED  Follow-Up: Your physician wants you to follow-up in: 6 MONTHS WITH DR. [place] Bend AT [facility]. You will receive a reminder letter in the mail two months in advance. If you don't receive a letter, please call our office to schedule the follow-up appointment.   Any Other Special Instructions Will Be Listed Below (If Applicable).     If you need a refill on your cardiac medications before your next appointment, please call your pharmacy.

## 2017-05-08 ENCOUNTER — Inpatient Hospital Stay: Admission: RE | Admit: 2017-05-08 | Payer: Medicare Other | Source: Ambulatory Visit

## 2017-05-21 ENCOUNTER — Ambulatory Visit: Payer: Medicare Other

## 2017-06-16 ENCOUNTER — Ambulatory Visit
Admission: RE | Admit: 2017-06-16 | Discharge: 2017-06-16 | Disposition: A | Discharge status: Home / Self Care | Payer: Medicare Other | Source: Ambulatory Visit | Attending: Internal Medicine | Admitting: Internal Medicine

## 2017-06-16 DIAGNOSIS — Z1231 Encounter for screening mammogram for malignant neoplasm of breast: Secondary | ICD-10-CM

## 2018-01-03 IMAGING — DX DG SHOULDER 2+V*R*
3 series · 3 of 3 positions shown · non-contrast
Comparison: None.

CLINICAL DATA: Right shoulder generalized pain, no known injury

EXAM:
RIGHT SHOULDER - 2+ VIEW

[shoulder grashey]
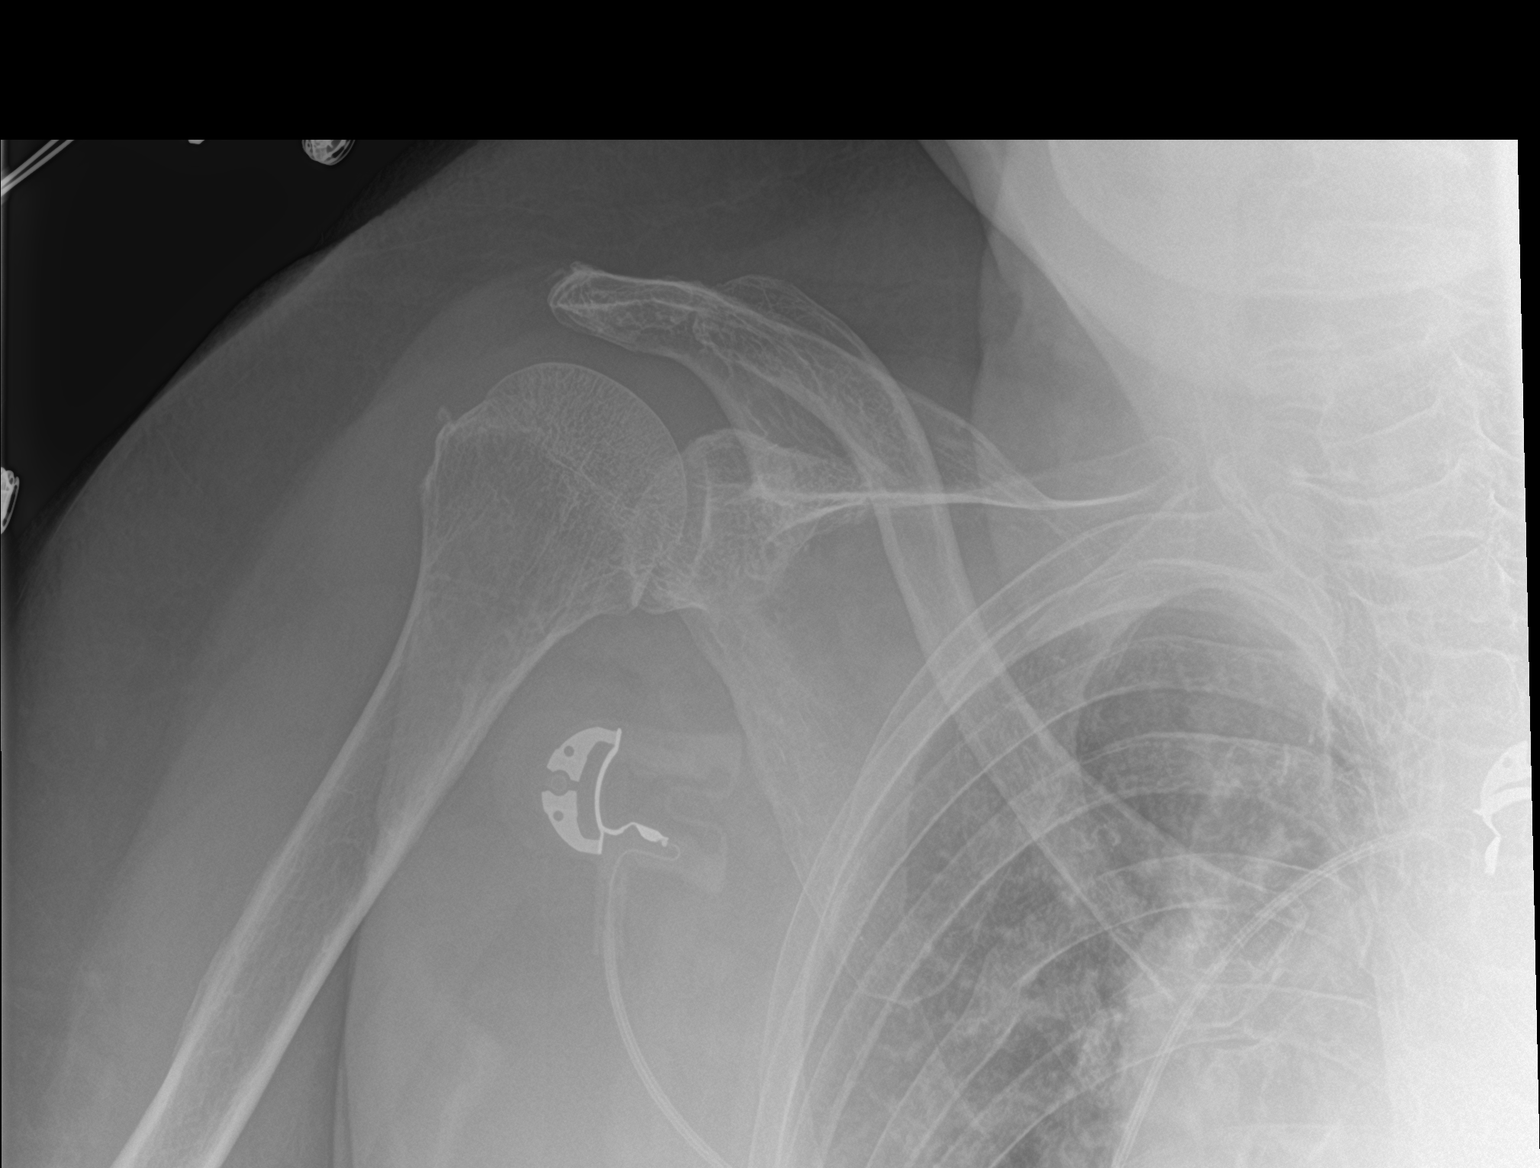

[shoulder axillary]
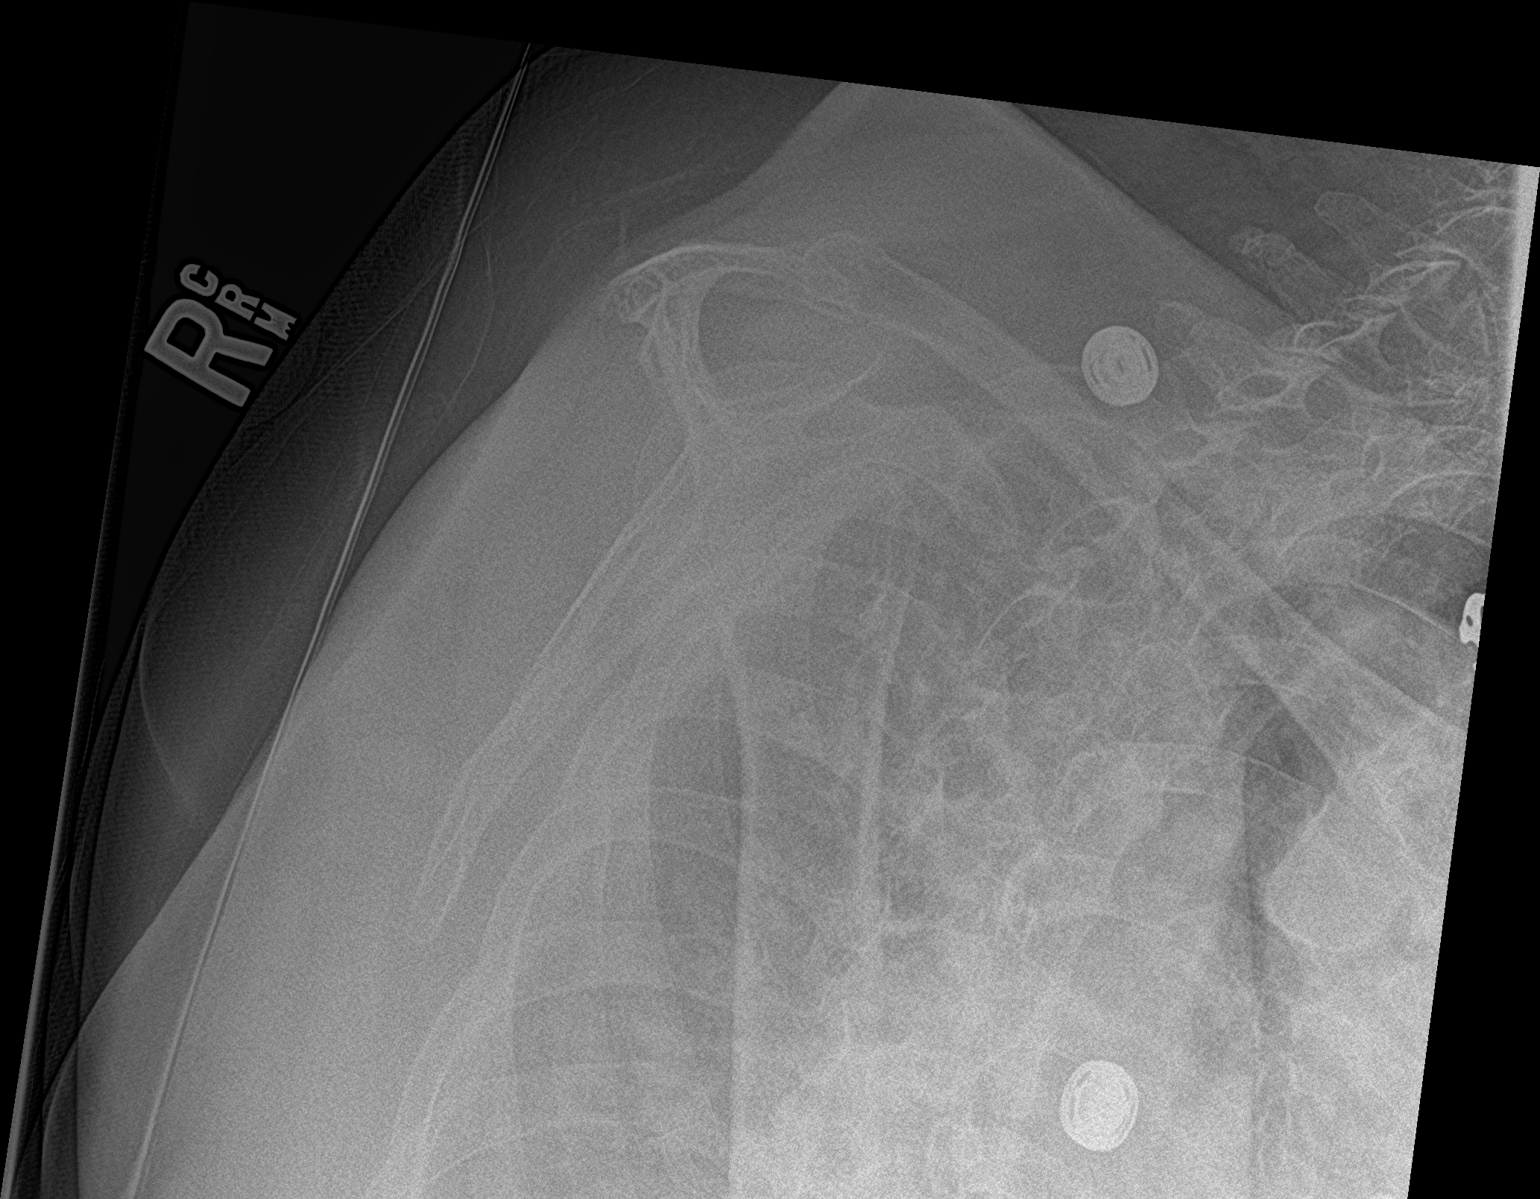

[shoulder y view]
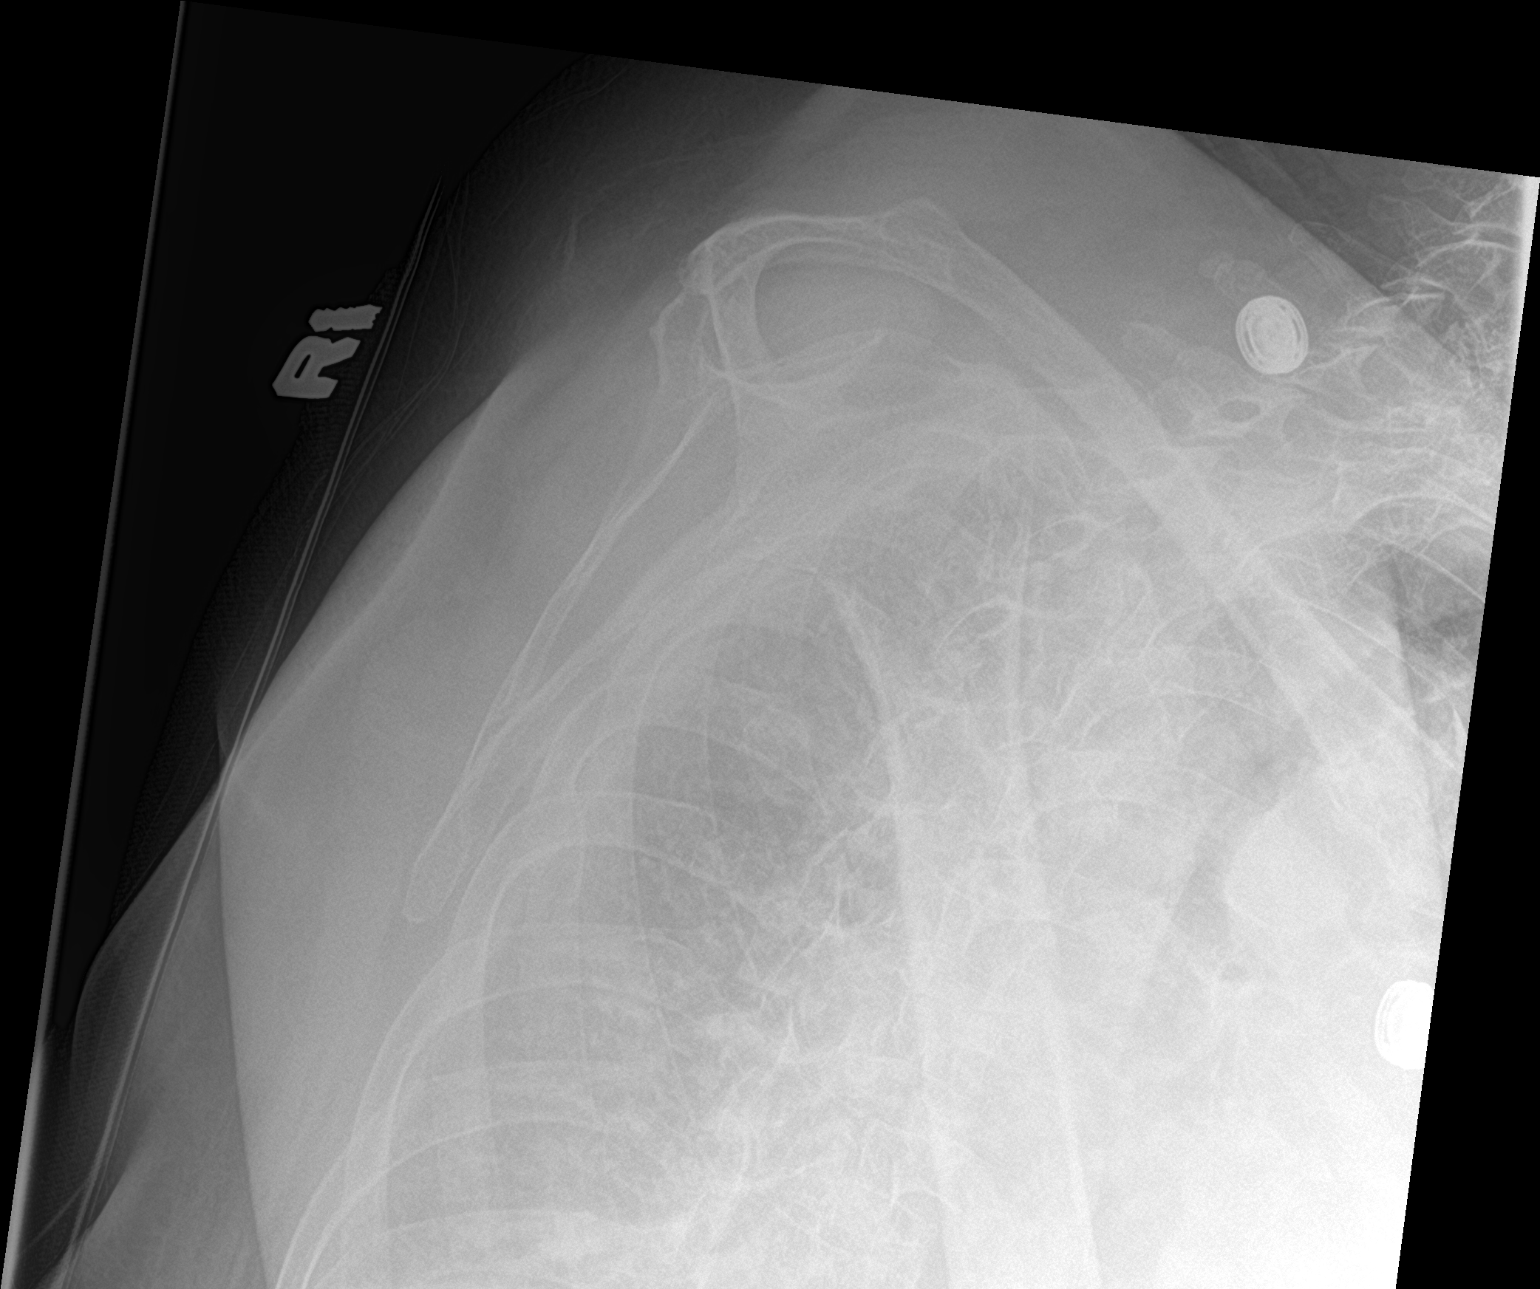

[3 of 3 positions shown; findings below may reference images not displayed]

FINDINGS: Three views of the right shoulder submitted. No acute fracture or
subluxation. Mild degenerative changes AC joint. Mild spurring of
acromion. Glenohumeral joint is preserved. Tiny calcification
adjacent of the humeral head probable calcific tendinosis.
IMPRESSION: No acute fracture or subluxation. Mild degenerative changes AC
joint. Mild spurring of acromion. Glenohumeral joint is preserved.
Tiny calcification adjacent of the humeral head probable calcific
tendinosis.

## 2018-06-10 ENCOUNTER — Other Ambulatory Visit: Payer: Self-pay | Admitting: Internal Medicine

## 2018-06-10 DIAGNOSIS — Z1231 Encounter for screening mammogram for malignant neoplasm of breast: Secondary | ICD-10-CM

## 2018-07-03 ENCOUNTER — Ambulatory Visit
Admission: RE | Admit: 2018-07-03 | Discharge: 2018-07-03 | Disposition: A | Discharge status: Home / Self Care | Payer: Medicare Other | Source: Ambulatory Visit | Attending: Internal Medicine | Admitting: Internal Medicine

## 2018-07-03 DIAGNOSIS — Z1231 Encounter for screening mammogram for malignant neoplasm of breast: Secondary | ICD-10-CM

## 2018-07-22 ENCOUNTER — Emergency Department (HOSPITAL_COMMUNITY): Payer: Medicare Other

## 2018-07-22 ENCOUNTER — Encounter (HOSPITAL_COMMUNITY): Payer: Self-pay | Admitting: Emergency Medicine

## 2018-07-22 ENCOUNTER — Other Ambulatory Visit: Payer: Self-pay

## 2018-07-22 ENCOUNTER — Emergency Department (HOSPITAL_COMMUNITY)
Admission: EM | Admit: 2018-07-22 | Discharge: 2018-07-22 | Disposition: A | Discharge status: Home / Self Care | Payer: Medicare Other | Attending: Emergency Medicine | Admitting: Emergency Medicine

## 2018-07-22 DIAGNOSIS — I252 Old myocardial infarction: Secondary | ICD-10-CM | POA: Insufficient documentation

## 2018-07-22 DIAGNOSIS — Z7982 Long term (current) use of aspirin: Secondary | ICD-10-CM | POA: Diagnosis not present

## 2018-07-22 DIAGNOSIS — Z79899 Other long term (current) drug therapy: Secondary | ICD-10-CM | POA: Diagnosis not present

## 2018-07-22 DIAGNOSIS — I2510 Atherosclerotic heart disease of native coronary artery without angina pectoris: Secondary | ICD-10-CM | POA: Insufficient documentation

## 2018-07-22 DIAGNOSIS — I10 Essential (primary) hypertension: Secondary | ICD-10-CM | POA: Insufficient documentation

## 2018-07-22 DIAGNOSIS — E10649 Type 1 diabetes mellitus with hypoglycemia without coma: Secondary | ICD-10-CM | POA: Diagnosis present

## 2018-07-22 DIAGNOSIS — E162 Hypoglycemia, unspecified: Secondary | ICD-10-CM

## 2018-07-22 DIAGNOSIS — Z7984 Long term (current) use of oral hypoglycemic drugs: Secondary | ICD-10-CM | POA: Diagnosis not present

## 2018-07-22 HISTORY — DX: Weakness: R53.1

## 2018-07-22 HISTORY — DX: Hypo-osmolality and hyponatremia: E87.1

## 2018-07-22 HISTORY — DX: Anemia, unspecified: D64.9

## 2018-07-22 HISTORY — DX: Unspecified hearing loss, unspecified ear: H91.90

## 2018-07-22 LAB — COMPREHENSIVE METABOLIC PANEL
ALK PHOS: 38 U/L (ref 38–126)
ALT: 16 U/L (ref 0–44)
ANION GAP: 9 (ref 5–15)
AST: 28 U/L (ref 15–41)
Albumin: 4 g/dL (ref 3.5–5.0)
BILIRUBIN TOTAL: 0.6 mg/dL (ref 0.3–1.2)
BUN: 22 mg/dL (ref 8–23)
CALCIUM: 8.9 mg/dL (ref 8.9–10.3)
CO2: 24 mmol/L (ref 22–32)
Chloride: 109 mmol/L (ref 98–111)
Creatinine, Ser: 0.54 mg/dL (ref 0.44–1.00)
GFR calc Af Amer: >60 mL/min (ref >60)
Glucose, Bld: 180 mg/dL — ABNORMAL HIGH (ref 70–99)
Potassium: 3.7 mmol/L (ref 3.5–5.1)
Sodium: 142 mmol/L (ref 135–145)
TOTAL PROTEIN: 7.8 g/dL (ref 6.5–8.1)

## 2018-07-22 LAB — CBC WITH DIFFERENTIAL/PLATELET
Basophils Absolute: 0 10*3/uL (ref 0.0–0.1)
Basophils Relative: 0 %
Eosinophils Absolute: 0 10*3/uL (ref 0.0–0.7)
Eosinophils Relative: 1 %
HEMATOCRIT: 34.1 % — AB (ref 36.0–46.0)
HEMOGLOBIN: 11.1 g/dL — AB (ref 12.0–15.0)
LYMPHS ABS: 1 10*3/uL (ref 0.7–4.0)
LYMPHS PCT: 12 %
MCH: 26.6 pg (ref 26.0–34.0)
MCHC: 32.6 g/dL (ref 30.0–36.0)
MCV: 81.6 fL (ref 78.0–100.0)
MONO ABS: 0.2 10*3/uL (ref 0.1–1.0)
MONOS PCT: 3 %
NEUTROS PCT: 84 %
Neutro Abs: 6.8 10*3/uL (ref 1.7–7.7)
Platelets: 245 10*3/uL (ref 150–400)
RBC: 4.18 MIL/uL (ref 3.87–5.11)
RDW: 14.5 % (ref 11.5–15.5)
WBC: 8.1 10*3/uL (ref 4.0–10.5)

## 2018-07-22 LAB — I-STAT CHEM 8, ED
BUN: 25 mg/dL — ABNORMAL HIGH (ref 8–23)
CALCIUM ION: 1.18 mmol/L (ref 1.15–1.40)
Chloride: 105 mmol/L (ref 98–111)
Creatinine, Ser: 0.5 mg/dL (ref 0.44–1.00)
GLUCOSE: 177 mg/dL — AB (ref 70–99)
HCT: 35 % — ABNORMAL LOW (ref 36.0–46.0)
HEMOGLOBIN: 11.9 g/dL — AB (ref 12.0–15.0)
Potassium: 3.9 mmol/L (ref 3.5–5.1)
SODIUM: 143 mmol/L (ref 135–145)
TCO2: 24 mmol/L (ref 22–32)

## 2018-07-22 LAB — CBG MONITORING, ED
Glucose-Capillary: 171 mg/dL — ABNORMAL HIGH (ref 70–99)
Glucose-Capillary: 129 mg/dL — ABNORMAL HIGH (ref 70–99)

## 2018-07-22 LAB — I-STAT TROPONIN, ED: TROPONIN I, POC: 0 ng/mL (ref 0.00–0.08)

## 2018-07-22 MED ORDER — SODIUM CHLORIDE 0.9 % IV BOLUS
500.0000 mL | Freq: Once | INTRAVENOUS | Status: AC
  Administered 2018-07-22: 500 mL via INTRAVENOUS

## 2018-07-22 MED ORDER — DEXTROSE 50 % IV SOLN
INTRAVENOUS | Status: AC
  Filled 2018-07-22: qty 50

## 2018-07-22 NOTE — Discharge Instructions (Addendum)
Stop taking your glipizide.  Check your blood sugar twice a day and write it down.  Then see your doctor next week and let him see what your sugars been doing without your glipizide.  Make sure you eat 3 meals a day.  Return if any problems

## 2018-07-22 NOTE — ED Provider Notes (Addendum)
[facility]-EMERGENCY DEPT Provider Note   CSN: [id number] Arrival date & time: 07/22/18  1840     History   Chief Complaint Chief Complaint  Patient presents with  . Hypoglycemia    HPI Amy Hood is a 77 y.o. female.  Patient had an episode of hypoglycemia.  She was found unresponsive with a low glucose.  She improved when the EMS gave her an amp of D50  The history is provided by the patient. No language interpreter was used.  Weakness  Primary symptoms include no focal weakness. This is a Amy problem. The current episode started 1 to 2 hours ago. The problem has been resolved. There was no focality noted. There has been no fever. Pertinent negatives include no shortness of breath, no chest pain and no headaches. There were no medications administered prior to arrival. Associated medical issues do not include trauma.    Past Medical History:  Diagnosis Date  . Abnormal ECG   . Acute cystitis   . Anemia   . Arthritis   . Atrial fibrillation with slow ventricular response (HCC)   . Cataracts, bilateral   . Coronary artery disease   . Essential hypertension   . Generalized weakness   . Hard of hearing   . Hyperlipidemia   . Hyponatremia   . Mild aortic stenosis 12/30/2016  . Mild aortic stenosis   . Type 2 diabetes mellitus (HCC)   . UTI (urinary tract infection)     Patient Active Problem List   Diagnosis Date Noted  . Type 2 diabetes mellitus without complication (HCC) 04/27/2017  . Pressure injury of skin 12/31/2016  . Mild aortic stenosis 12/30/2016  . NSTEMI (non-ST elevated myocardial infarction) (HCC) 12/28/2016  . CAD (coronary artery disease) 12/28/2016  . Abnormal ECG 12/28/2016  . Generalized weakness 12/28/2016  . Non-English speaking patient 12/28/2016  . Thrombocytopenia (HCC) 12/28/2016  . PAF (paroxysmal atrial fibrillation) (HCC) 12/28/2016  . Hard of hearing 12/28/2016  . Essential hypertension   . Arthritis   .  Compression fracture of L5 lumbar vertebra (HCC) 09/20/2013  . Acute lumbar radiculopathy 09/20/2013  . Acute encephalopathy 09/19/2013  . Suicidal behavior 09/19/2013  . Hyperlipidemia   . Cataracts, bilateral     Past Surgical History:  Procedure Laterality Date  . CARDIAC CATHETERIZATION N/A 12/31/2016   Procedure: Left Heart Cath and Coronary Angiography;  Surgeon: Corky CraftsJayadeep S Varanasi, MD;  [facility];  Service: Cardiovascular;  Laterality: N/A;  . CARDIAC CATHETERIZATION N/A 12/31/2016   Procedure: Coronary Stent Intervention;  Surgeon: Corky CraftsJayadeep S Varanasi, MD;  [facility];  Service: Cardiovascular;  Laterality: N/A;  . KYPHOPLASTY       OB History   None      Home Medications    Prior to Admission medications   Medication Sig Start Date End Date Taking? Authorizing Provider  aspirin 81 MG chewable tablet Chew 81 mg by mouth daily.   Yes [provider]  cholecalciferol 4000 units TABS Take 4,000 Units by mouth daily. Patient taking differently: Take 1,000 Units by mouth daily.  01/02/17  Yes Lama, Sarina IllGagan S, MD  glimepiride (AMARYL) 4 MG tablet Take 4 mg by mouth daily before breakfast.   Yes [provider]  HYDROcodone-acetaminophen (NORCO/VICODIN) 5-325 MG tablet Take 1 tablet by mouth every 6 (six) hours as needed. Patient taking differently: Take 1 tablet by mouth 3 (three) times daily as needed for moderate pain.  01/02/17  Yes Meredeth Ide, MD  pioglitazone (ACTOS) 45 MG tablet Take 45 mg by mouth daily.   Yes [provider]  carvedilol (COREG) 6.25 MG tablet Take 1 tablet (6.25 mg total) by mouth 2 (two) times daily with a meal. Patient not taking: Reported on 07/22/2018 01/02/17   Meredeth Ide, MD  iron polysaccharides (NIFEREX) 150 MG capsule Take 1 capsule (150 mg total) by mouth daily. Patient not taking: Reported on 07/22/2018 09/21/13   Georgann Housekeeper, MD  rosuvastatin (CRESTOR) 20 MG tablet Take 1  tablet (20 mg total) by mouth daily at 6 PM. Patient not taking: Reported on 07/22/2018 01/02/17   Meredeth Ide, MD  ticagrelor (BRILINTA) 90 MG TABS tablet Take 1 tablet (90 mg total) by mouth 2 (two) times daily. Patient not taking: Reported on 07/22/2018 01/02/17   Meredeth Ide, MD    Family History Family History  Problem Relation Age of Onset  . Diabetes Mother     Social History Social History   Tobacco Use  . Smoking status: Never Smoker  . Smokeless tobacco: Never Used  Substance Use Topics  . Alcohol use: No  . Drug use: No     Allergies   Patient has no known allergies.   Review of Systems Review of Systems  Constitutional: Negative for appetite change and fatigue.  HENT: Negative for congestion, ear discharge and sinus pressure.   Eyes: Negative for discharge.  Respiratory: Negative for cough and shortness of breath.   Cardiovascular: Negative for chest pain.  Gastrointestinal: Negative for abdominal pain and diarrhea.  Genitourinary: Negative for frequency and hematuria.  Musculoskeletal: Negative for back pain.  Skin: Negative for rash.  Neurological: Positive for weakness. Negative for focal weakness, seizures and headaches.  Psychiatric/Behavioral: Negative for hallucinations.     Physical Exam Updated Vital Signs BP (!) 167/78 (BP Location: Right Arm)   Pulse 76   Temp 98.7 F (37.1 C) (Oral)   Resp 13   Ht 4\' 9"  (1.448 m)   Wt 63.5 kg   SpO2 99%   BMI 30.30 kg/m   Physical Exam  Constitutional: She is oriented to person, place, and time. She appears well-developed.  HENT:  Head: Normocephalic.  Eyes: Conjunctivae and EOM are normal. No scleral icterus.  Neck: Neck supple. No thyromegaly present.  Cardiovascular: Normal rate and regular rhythm. Exam reveals no gallop and no friction rub.  No murmur heard. Pulmonary/Chest: No stridor. She has no wheezes. She has no rales. She exhibits no tenderness.  Abdominal: She exhibits no distension.  There is no tenderness. There is no rebound.  Musculoskeletal: Normal range of motion. She exhibits no edema.  Lymphadenopathy:    She has no cervical adenopathy.  Neurological: She is oriented to person, place, and time. She exhibits normal muscle tone. Coordination normal.  Skin: No rash noted. No erythema.  Psychiatric: She has a normal mood and affect. Her behavior is normal.     ED Treatments / Results  Labs (all labs ordered are listed, but only abnormal results are displayed) Labs Reviewed  CBC WITH DIFFERENTIAL/PLATELET - Abnormal; Notable for the following components:      Result Value   Hemoglobin 11.1 (*)    HCT 34.1 (*)    All other components within normal limits  COMPREHENSIVE METABOLIC PANEL - Abnormal; Notable for the following components:   Glucose, Bld 180 (*)    All other components within normal limits  CBG MONITORING, ED - Abnormal; Notable for  the following components:   Glucose-Capillary 171 (*)    All other components within normal limits  I-STAT CHEM 8, ED - Abnormal; Notable for the following components:   BUN 25 (*)    Glucose, Bld 177 (*)    Hemoglobin 11.9 (*)    HCT 35.0 (*)    All other components within normal limits  CBG MONITORING, ED - Abnormal; Notable for the following components:   Glucose-Capillary 129 (*)    All other components within normal limits  I-STAT TROPONIN, ED    EKG None  Radiology Dg Chest Port 1 View  Result Date: 07/22/2018 CLINICAL DATA:  Weakness EXAM: PORTABLE CHEST 1 VIEW COMPARISON:  12/29/2016 FINDINGS: Cardiomegaly with vascular congestion and mild edema. No focal airspace disease or significant effusion. Aortic atherosclerosis. No pneumothorax. Old right-sided rib fractures. IMPRESSION: Cardiomegaly with vascular congestion and mild interstitial edema. Electronically Signed   By: Jasmine PangKim  Fujinaga M.D.   On: 07/22/2018 19:42    Procedures Procedures (including critical care time)  Medications Ordered in  ED Medications  sodium chloride 0.9 % bolus 500 mL (0 mLs Intravenous Stopped 07/22/18 2031)     Initial Impression / Assessment and Plan / ED Course  I have reviewed the triage vital signs and the nursing notes.  Pertinent labs & imaging results that were available during my care of the patient were review  (see chart for details).   Patient with hypoglycemic episode.  She responded well to amp of D50.  Patient ate a meal here and has been observed few hours and has been doing well with her glucose staying normal.  I spoke with her daughter and we have decided to stop her glipizide since she does not seem to be eating very much.  She will have her glucose checked twice a day and give the results to her family doctor next week to decide whether they will continue the glipizide or make a different change.    EKG Interpretation  Date/Time:    Ventricular Rate:    PR Interval:    QRS Duration:   QT Interval:    QTC Calculation:   R Axis:     Text Interpretation:           Final Clinical Impressions(s) / ED Diagnoses   Final diagnoses:  Hypoglycemia    ED Discharge Orders    None       Bethann BerkshireZammit, Alany Borman, MD 07/22/18 2231    Bethann BerkshireZammit, Srinivas Lippman, MD 08/11/18 1038

## 2018-07-22 NOTE — ED Triage Notes (Signed)
Patient BIB EMS from home. Patient speaks vietnamese. Patient family reports they left her alone to go to the store, and when they returned, she was unresponsive. Patient's FSBG was 29. Fire Dept administered one pack of oral glucose and half a coke at approx 1730. Patient then woke up and walked to the EMS stretcher. The patient's FSBG in EMS was 60 and patient was administered IV D10 (25gm) at 1810 by EMS. Patient awake, but diaphoretic and cold to the touch in triage. FSBG in triage was 141.

## 2018-07-23 ENCOUNTER — Encounter: Payer: Self-pay | Admitting: Physician Assistant

## 2018-08-14 ENCOUNTER — Encounter: Payer: Self-pay | Admitting: Physician Assistant

## 2018-08-14 ENCOUNTER — Ambulatory Visit (INDEPENDENT_AMBULATORY_CARE_PROVIDER_SITE_OTHER): Payer: Medicare Other | Admitting: Physician Assistant

## 2018-08-14 VITALS — BP 150/80 | HR 67 | Ht <= 58 in | Wt 161.4 lb

## 2018-08-14 DIAGNOSIS — E119 Type 2 diabetes mellitus without complications: Secondary | ICD-10-CM

## 2018-08-14 DIAGNOSIS — I480 Paroxysmal atrial fibrillation: Secondary | ICD-10-CM

## 2018-08-14 DIAGNOSIS — I2510 Atherosclerotic heart disease of native coronary artery without angina pectoris: Secondary | ICD-10-CM | POA: Diagnosis not present

## 2018-08-14 DIAGNOSIS — E7800 Pure hypercholesterolemia, unspecified: Secondary | ICD-10-CM

## 2018-08-14 DIAGNOSIS — I350 Nonrheumatic aortic (valve) stenosis: Secondary | ICD-10-CM

## 2018-08-14 DIAGNOSIS — I10 Essential (primary) hypertension: Secondary | ICD-10-CM

## 2018-08-14 MED ORDER — CLOPIDOGREL BISULFATE 75 MG PO TABS: 75.0000 mg | ORAL_TABLET | Freq: Every day | ORAL | 3 refills | Status: DC

## 2018-08-14 NOTE — Patient Instructions (Signed)
Medication Instructions:  1. FINISH YOUR BOTTLE OF BRILINTA; THEN STOP  2. AFTER YOU HAVE FINISHED YOUR BOTTLE OF BRILINTA YOU WILL BEGIN THE NEXT DAY ON PLAVIX 75 MG ONCE A DAY   Labwork: NONE ORDERED TODAY  Testing/Procedures: Your physician has requested that you have an echocardiogram. Echocardiography is a painless test that uses sound waves to create images of your heart. It provides your doctor with information about the size and shape of your heart and how well your heart's chambers and valves are working. This procedure takes approximately one hour. There are no restrictions for this procedure.    Follow-Up: 6 MONTHS WITH SCOTT WEAVER, [facility]   Any Other Special Instructions Will Be Listed Below (If Applicable).  BRING BLOOD PRESSURE READINGS IN WHEN YOU COME IN FOR YOUR ECHOCARDIOGRAM    If you need a refill on your cardiac medications before your next appointment, please call your pharmacy.

## 2018-08-14 NOTE — Progress Notes (Signed)
Cardiology Office Note:    Date:  08/14/2018   ID:  Amy Hood, DOB Jul 15, 1941, MRN [id number]  PCP:  Wenda Low, MD  Cardiologist:  Skeet Latch, MD   Referring MD: Wenda Low, MD   Chief Complaint  Patient presents with  . Follow-up    CAD, AS    History of Present Illness:    Amy Hood is a 77 y.o. [place] female with coronary artery disease status post non-ST elevation myocardial infarction in January 2018 treated with drug-eluting stent to the mid LAD and drug-eluting stent to the proximal LCx.  She initially presented to the hospital at the time of her myocardial infarction with community-acquired pneumonia and urinary tract infection.  Her hospital course was complicated by paroxysmal atrial fibrillation which was felt to be related to her acute medical illness.  Therefore, she was not placed on long-term anticoagulation.  She was last seen in 04/2017.    Amy Hood returns for follow-up on coronary artery disease.  She is here with her daughter who helps interpret.  [place]  She has been doing well without chest discomfort or shortness of breath.  She denies orthopnea, PND, edema, syncope.  She denies any bleeding issues.  Prior CV studies:   The following studies were reviewed today:  LHC 12/31/16 LAD ost 40, mid 80 LCx prox 75 RCA mid 25 PCI:  3 x 24 mm Synergy DES to mid LAD; 3.5 x 28 mm Synergy DES to proximal LCx   Echo 12/29/16 Mild LVH, EF 55-60, apical inf and apical HK, Gr 1 DD, AV mean 13, MAC, trivial MR, reduced RVSF, trivial TR, PASP 35, no eff  Past Medical History:  Diagnosis Date  . Anemia   . Arthritis   . CAD (coronary artery disease) 12/28/2016   S/p NSTEMI 1/18:  LHC - oLAD, mLAD 80, pLCx 75, mRCA 25 >> PCI:  3 x 24 mm Synergy DES to mid LAD; 3.5 x 28 mm Synergy DES to proximal LCx // Echo 1/18:  Mild LVH, EF 55-60, apical inf and apical HK, Gr 1 DD, AV mean 13, MAC, trivial MR, reduced  RVSF, trivial TR, PASP 35, no eff  . Cataracts, bilateral   . Essential hypertension   . Generalized weakness   . Hard of hearing   . History of atrial fibrillation    post MI - likely related to acute medical illness therefore anticoag not started  . Hyperlipidemia   . Hyponatremia   . Mild aortic stenosis 12/30/2016   Echo 1/18:  Mild LVH, EF 55-60, apical inf and apical HK, Gr 1 DD, AV mean 13, MAC, trivial MR, reduced RVSF, trivial TR, PASP 35, no eff  . Type 2 diabetes mellitus ([place]    Surgical Hx: The patient  has a past surgical history that includes Kyphoplasty; Cardiac catheterization (N/A, 12/31/2016); and Cardiac catheterization (N/A, 12/31/2016).   Current Medications: Current Meds  Medication Sig  . aspirin 81 MG chewable tablet Chew 81 mg by mouth daily.  . carvedilol (COREG) 6.25 MG tablet Take 1 tablet (6.25 mg total) by mouth 2 (two) times daily with a meal.  . cholecalciferol 4000 units TABS Take 4,000 Units by mouth daily. (Patient taking differently: Take 1,000 Units by mouth daily. )  . HYDROcodone-acetaminophen (NORCO/VICODIN) 5-325 MG tablet Take 1 tablet by mouth every 6 (six) hours as needed. (Patient taking differently: Take 1 tablet by mouth 3 (three) times daily as needed for  moderate pain. )  . iron polysaccharides (NIFEREX) 150 MG capsule Take 1 capsule (150 mg total) by mouth daily.  . pioglitazone (ACTOS) 45 MG tablet Take 45 mg by mouth daily.  . rosuvastatin (CRESTOR) 20 MG tablet Take 1 tablet (20 mg total) by mouth daily at 6 PM.  . [DISCONTINUED] ticagrelor (BRILINTA) 90 MG TABS tablet Take 1 tablet (90 mg total) by mouth 2 (two) times daily.     Allergies:   Patient has no known allergies.   Social History   Tobacco Use  . Smoking status: Never Smoker  . Smokeless tobacco: Never Used  Substance Use Topics  . Alcohol use: No  . Drug use: No     Family Hx: The patient's family history includes Diabetes in her mother.  ROS:   Please see  the history of present illness.    Review of Systems  HENT: Positive for hearing loss.   Respiratory: Positive for cough.   Musculoskeletal: Positive for back pain.   All other systems reviewed and are negative.   EKGs/Labs/Other Test Reviewed:    EKG:  EKG is  ordered today.  The ekg ordered today demonstrates normal sinus rhythm, heart rate 67, nonspecific ST-T wave changes, PVC, QTC 414  Recent Labs: 07/22/2018: ALT 16; BUN 25; Creatinine, Ser 0.50; Hemoglobin 11.9; Platelets 245; Potassium 3.9; Sodium 143   Recent Lipid Panel Lab Results  Component Value Date/Time   CHOL 116 12/31/2016 03:23 AM   TRIG 117 12/31/2016 03:23 AM   HDL 41 12/31/2016 03:23 AM   CHOLHDL 2.8 12/31/2016 03:23 AM   LDLCALC 52 12/31/2016 03:23 AM   From [facility] Tool: Cholesterol, total  139.000  06/09/2018 HDL    48.000  06/09/2018 LDL    70.000  06/09/2018 Triglycerides   108.000  06/09/2018 A1C    7.500   06/09/2018 Hemoglobin   11.900  07/22/2018 Creatinine, Serum  0.500   07/22/2018 Potassium   3.900   07/22/2018 ALT (SGPT)   16.000  07/22/2018 TSH    0.497   12/28/2016 INR    1.040   12/31/2016 Platelets   245.000  07/22/2018  Physical Exam:    VS:  BP (!) 150/80   Pulse 67   Ht 4' 9"  (1.448 m)   Wt 161 lb 6.4 oz (73.2 kg)   SpO2 94%   BMI 34.93 kg/m     Wt Readings from Last 3 Encounters:  08/14/18 161 lb 6.4 oz (73.2 kg)  07/22/18 140 lb (63.5 kg)  04/28/17 169 lb 12.8 oz (77 kg)     Physical Exam  Constitutional: She is oriented to person, place, and time. She appears well-developed and well-nourished. No distress.  HENT:  Head: Normocephalic and atraumatic.  Eyes: No scleral icterus.  Neck: No JVD present. No thyromegaly present.  Cardiovascular: Normal rate, regular rhythm, S1 normal and S2 normal.  Murmur heard.  Crescendo-decrescendo systolic murmur is present with a grade of 2/6 at the upper right sternal border. Pulmonary/Chest: Effort normal. She has no wheezes. She has no rales.    Abdominal: Soft. She exhibits no distension.  Musculoskeletal: She exhibits no edema.  Lymphadenopathy:    She has no cervical adenopathy.  Neurological: She is alert and oriented to person, place, and time.  Skin: Skin is warm and dry.  Psychiatric: She has a normal mood and affect.    ASSESSMENT & PLAN:    Coronary artery disease involving native coronary artery of native heart without angina pectoris History  of non-ST elevation myocardial infarctions in her 2018 treated with drug-eluting stent to mid LAD and drug-eluting stent to the proximal LCx.  She is doing well without angina.  She has completed greater than 12 months of dual antiplatelet therapy with aspirin and Ticagrelor.  As she is a diabetic and has multiple stents, I recommend changing her Ticagrelor to clopidogrel.  -Continue aspirin, beta-blocker, statin, ARB  -DC Ticagrelor  -Start clopidogrel 75 mg daily  PAF (paroxysmal atrial fibrillation) (HCC) Maintaining normal sinus rhythm.  This occurred in the context of acute medical illness.  If she has recurrent atrial fibrillation in the future, she will need long-term anticoagulation.  Mild aortic stenosis No symptoms to suggest significant worsening.  But, it has been almost 2 years since her last echocardiogram.  Arrange follow-up echocardiogram.  Essential hypertension Blood pressure above target today.  On repeat by me it is 140/70.  I have asked her to continue to monitor her blood pressure and send me the readings.  Goal is <130/80.  Pure hypercholesterolemia LDL optimal on most recent lab work.  Continue current Rx.    Type 2 diabetes mellitus without complication, without long-term current use of insulin ([place]) Managed by primary care.  She has had some hypoglycemic episodes recently.  Empagliflozin could be considered given the results of the EMPA-REG OUTCOME trial.    Dispo:  Return in about 6 months (around 02/12/2019) for Routine Follow Up, w/ Richardson Dopp,  PA-C.   Medication Adjustments/Labs and Tests Ordered: Current medicines are reviewed at length with the patient today.  Concerns regarding medicines are outlined above.  Tests Ordered: Orders Placed This Encounter  Procedures  . EKG 12-Lead  . ECHOCARDIOGRAM COMPLETE   Medication Changes: Meds ordered this encounter  Medications  . clopidogrel (PLAVIX) 75 MG tablet    Sig: Take 1 tablet (75 mg total) by mouth daily. DO NOT START UNTIL FINISHED WITH BRILINTA    Dispense:  90 tablet    Refill:  3    PT WILL FINISH BRILINTA AND THEN START PLAVIX THE NEXT DAY AFTER [facility]    Signed, Richardson Dopp, PA-C  08/14/2018 1:12 PM    [facility] [place], [place], [place]  [address] Phone: [phone]; Fax: [phone]

## 2018-08-16 NOTE — Progress Notes (Signed)
Seems reasonable.

## 2018-09-02 ENCOUNTER — Ambulatory Visit (HOSPITAL_COMMUNITY): Payer: Medicare Other | Attending: Physician Assistant

## 2018-09-02 ENCOUNTER — Encounter: Payer: Self-pay | Admitting: Physician Assistant

## 2018-09-02 ENCOUNTER — Telehealth: Payer: Self-pay | Admitting: Physician Assistant

## 2018-09-02 ENCOUNTER — Other Ambulatory Visit: Payer: Self-pay

## 2018-09-02 DIAGNOSIS — I252 Old myocardial infarction: Secondary | ICD-10-CM | POA: Insufficient documentation

## 2018-09-02 DIAGNOSIS — I350 Nonrheumatic aortic (valve) stenosis: Secondary | ICD-10-CM | POA: Insufficient documentation

## 2018-09-02 DIAGNOSIS — E785 Hyperlipidemia, unspecified: Secondary | ICD-10-CM | POA: Insufficient documentation

## 2018-09-02 DIAGNOSIS — I4891 Unspecified atrial fibrillation: Secondary | ICD-10-CM | POA: Diagnosis not present

## 2018-09-02 DIAGNOSIS — I10 Essential (primary) hypertension: Secondary | ICD-10-CM

## 2018-09-02 DIAGNOSIS — I119 Hypertensive heart disease without heart failure: Secondary | ICD-10-CM | POA: Insufficient documentation

## 2018-09-02 DIAGNOSIS — I2510 Atherosclerotic heart disease of native coronary artery without angina pectoris: Secondary | ICD-10-CM | POA: Insufficient documentation

## 2018-09-02 DIAGNOSIS — E119 Type 2 diabetes mellitus without complications: Secondary | ICD-10-CM | POA: Insufficient documentation

## 2018-09-02 NOTE — Progress Notes (Unsigned)
Interpreter, Joaquin BendLek Siu was present for echocardiogram.

## 2018-09-02 NOTE — Telephone Encounter (Signed)
Walk in pt form-BP readings dropped off. Placed in [employer] doc box.

## 2018-09-03 MED ORDER — LOSARTAN POTASSIUM 25 MG PO TABS: 25.0000 mg | ORAL_TABLET | Freq: Every day | ORAL | 3 refills | Status: DC

## 2018-09-03 NOTE — Addendum Note (Signed)
Addended by: Tarri Fuller on: 09/03/2018 12:29 PM   Modules accepted: Orders

## 2018-09-03 NOTE — Telephone Encounter (Signed)
DPR ok to s/w pt's granddaughter who has been advised of BP readings and recommendations. Advised to have pt start Losartan 25 mg daily, Rx has been sent in. BMET 10/11. Pt's granddaughter verbalized understanding x 2 with read back. Granddaughter also aware of echo results.

## 2018-09-03 NOTE — Telephone Encounter (Signed)
BP readings reviewed. BP goal is < 130/80. BP is above goal. PLAN:  1. Start Losartan 25 mg QD 2. BMET 2 weeks. Tereso Newcomer, PA-C    09/03/2018 11:52 AM

## 2018-09-18 ENCOUNTER — Other Ambulatory Visit: Payer: Medicare Other | Admitting: *Deleted

## 2018-09-18 DIAGNOSIS — I10 Essential (primary) hypertension: Secondary | ICD-10-CM

## 2018-09-19 LAB — BASIC METABOLIC PANEL
BUN / CREAT RATIO: 22 (ref 12–28)
BUN: 15 mg/dL (ref 8–27)
CO2: 25 mmol/L (ref 20–29)
CREATININE: 0.67 mg/dL (ref 0.57–1.00)
Calcium: 9.4 mg/dL (ref 8.7–10.3)
Chloride: 101 mmol/L (ref 96–106)
GFR calc Af Amer: 97 mL/min/{1.73_m2} (ref >59)
GFR, EST NON AFRICAN AMERICAN: 84 mL/min/{1.73_m2} (ref >59)
Glucose: 150 mg/dL — ABNORMAL HIGH (ref 65–99)
Potassium: 4.3 mmol/L (ref 3.5–5.2)
SODIUM: 141 mmol/L (ref 134–144)

## 2018-09-21 ENCOUNTER — Telehealth: Payer: Self-pay | Admitting: *Deleted

## 2018-09-21 NOTE — Telephone Encounter (Signed)
-----   Message from Scott T Weaver, PA-C sent at 09/20/2018  1:27 PM EDT ----- Glucose elevated.  Renal function and potassium normal.   Recommendations:  - Continue current medications and follow up as planned.  Scott Weaver, PA-C    09/20/2018 1:26 PM 

## 2018-09-21 NOTE — Telephone Encounter (Signed)
Left message to go over lab results. I tried to call the granddaughter though no VM.

## 2018-09-23 NOTE — Telephone Encounter (Signed)
lmtcb x 2 to go over lab results.  

## 2018-09-23 NOTE — Telephone Encounter (Signed)
-----   Message from Beatrice Lecher, [place] sent at 09/20/2018  1:27 PM EDT ----- Glucose elevated.  Renal function and potassium normal.   Recommendations:  - Continue current medications and follow up as planned.  Tereso Newcomer, PA-C    09/20/2018 1:26 PM

## 2018-09-25 ENCOUNTER — Telehealth: Payer: Self-pay | Admitting: Physician Assistant

## 2018-09-25 NOTE — Telephone Encounter (Signed)
Left message to go over recommendations to increase Losartan to 50 mg daily per Tereso Newcomer, PA review of pt's BP recent BP readings. I also have lmom that we have lab results to go over as well.

## 2018-09-25 NOTE — Telephone Encounter (Signed)
BP readings reviewed. BP closer to target (< 130/80). PLAN: 1. Increase Losartan to 50 mg Once daily 2. BMET 2 weeks. Tereso Newcomer, PA-C    09/25/2018 10:02 AM

## 2018-09-25 NOTE — Telephone Encounter (Signed)
Left message x 3 to go over lab results. I also called to go over BP readings and recommendations per Bing Neighbors. PA.

## 2018-10-01 NOTE — Telephone Encounter (Signed)
Tried to reach pt again to go over BP readings and recommendations to increase Losartan. DPR ok to s/w pt's granddaughter, lmom.  I then called [employer] as well so that we could lmom in [place]

## 2018-10-16 ENCOUNTER — Encounter: Payer: Self-pay | Admitting: *Deleted

## 2018-10-16 NOTE — Telephone Encounter (Signed)
Letter sent to pt to call office and ask to speak with Tereso Newcomer, PA CMA Michaelle Copas, CMA to discuss BP readings and recommendations.

## 2018-11-02 ENCOUNTER — Emergency Department (HOSPITAL_COMMUNITY): Payer: Medicare Other

## 2018-11-02 ENCOUNTER — Emergency Department (HOSPITAL_COMMUNITY)
Admission: EM | Admit: 2018-11-02 | Discharge: 2018-11-03 | Disposition: A | Discharge status: Home / Self Care | Payer: Medicare Other | Attending: Emergency Medicine | Admitting: Emergency Medicine

## 2018-11-02 DIAGNOSIS — Z7984 Long term (current) use of oral hypoglycemic drugs: Secondary | ICD-10-CM | POA: Insufficient documentation

## 2018-11-02 DIAGNOSIS — Z7982 Long term (current) use of aspirin: Secondary | ICD-10-CM | POA: Insufficient documentation

## 2018-11-02 DIAGNOSIS — R109 Unspecified abdominal pain: Secondary | ICD-10-CM | POA: Diagnosis present

## 2018-11-02 DIAGNOSIS — E119 Type 2 diabetes mellitus without complications: Secondary | ICD-10-CM | POA: Diagnosis not present

## 2018-11-02 DIAGNOSIS — Z7902 Long term (current) use of antithrombotics/antiplatelets: Secondary | ICD-10-CM | POA: Diagnosis not present

## 2018-11-02 DIAGNOSIS — I10 Essential (primary) hypertension: Secondary | ICD-10-CM | POA: Diagnosis not present

## 2018-11-02 DIAGNOSIS — Z79899 Other long term (current) drug therapy: Secondary | ICD-10-CM | POA: Diagnosis not present

## 2018-11-02 DIAGNOSIS — R350 Frequency of micturition: Secondary | ICD-10-CM | POA: Insufficient documentation

## 2018-11-02 DIAGNOSIS — N10 Acute tubulo-interstitial nephritis: Secondary | ICD-10-CM | POA: Diagnosis not present

## 2018-11-02 DIAGNOSIS — I2510 Atherosclerotic heart disease of native coronary artery without angina pectoris: Secondary | ICD-10-CM | POA: Insufficient documentation

## 2018-11-02 MED ORDER — FENTANYL CITRATE (PF) 100 MCG/2ML IJ SOLN
50.0000 ug | Freq: Once | INTRAMUSCULAR | Status: AC
  Administered 2018-11-02: 50 ug via INTRAVENOUS
  Filled 2018-11-02: qty 2

## 2018-11-02 NOTE — ED Triage Notes (Signed)
Pt BIB [facility] from home. Pt is c/o lower back pain and flank pain. Family reports that the pt has been urinating more frequently then normal. She has chronic back pain but states that it is worse today. Denies N/V or other symptoms.

## 2018-11-02 NOTE — ED Provider Notes (Signed)
[facility] DEPT Provider Note   CSN: [id number] Arrival date & time: 11/02/18  2311     History   Chief Complaint Chief Complaint  Patient presents with  . Back Pain  . Flank Pain    HPI Amy Hood is a 77 y.o. female.  The history is provided by the patient and medical records.  Back Pain    Flank Pain     77 y.o. F with hx of anemia, arthritis, CAD, essential HTN, hx of AFIB, HLP, aortic stenosis, DM2, presenting to the ED for back and flank pain.  History obtained from patient's daughter who is assisting in translation as patient primarily speaks vietnamese.  Reportedly, patient started having back and bilateral flank pain on Saturday, got worse over the past 2 days.  She feels better when laying down flat, worse with any attempted movement.  Patient has had a lot of urinary frequency-- daughter reports she is urinating about every 20-30 mins.  Denies any dysuria or hematuria.  No fever, chills, nausea, vomiting.  No hx of kidney stones but has had urinary infections before.  Has also had prior history of kyphoplasty 5 years ago, no new numbness/weakness of the legs, no bowel or bladder incontinence.  Past Medical History:  Diagnosis Date  . Anemia   . Arthritis   . CAD (coronary artery disease) 12/28/2016   S/p NSTEMI 1/18:  LHC - oLAD, mLAD 80, pLCx 75, mRCA 25 >> PCI:  3 x 24 mm Synergy DES to mid LAD; 3.5 x 28 mm Synergy DES to proximal LCx // Echo 1/18:  Mild LVH, EF 55-60, apical inf and apical HK, Gr 1 DD, AV mean 13, MAC, trivial MR, reduced RVSF, trivial TR, PASP 35, no eff  . Cataracts, bilateral   . Essential hypertension   . Generalized weakness   . Hard of hearing   . History of atrial fibrillation    post MI - likely related to acute medical illness therefore anticoag not started  . Hyperlipidemia   . Hyponatremia   . Mild aortic stenosis 12/30/2016   Echo 1/18:  Mild LVH, EF 55-60, apical inf and apical HK, Gr 1 DD, AV mean  13, MAC, trivial MR, reduced RVSF, trivial TR, PASP 35, no eff // Echo 09/02/2018:  Severe focal basal septal hypertrophy, EF 60-65, no RWMA, Gr 2 DD, very mild AS (mean 11), mild LAE   . Type 2 diabetes mellitus [facility]     Patient Active Problem List   Diagnosis Date Noted  . Type 2 diabetes mellitus without complication ([place]) 40/98/1191  . Pressure injury of skin 12/31/2016  . Mild aortic stenosis 12/30/2016  . NSTEMI (non-ST elevated myocardial infarction) () 12/28/2016  . CAD (coronary artery disease) 12/28/2016  . Abnormal ECG 12/28/2016  . Generalized weakness 12/28/2016  . Non-English speaking patient 12/28/2016  . Thrombocytopenia ([place]) 12/28/2016  . PAF (paroxysmal atrial fibrillation) ([place]) 12/28/2016  . Hard of hearing 12/28/2016  . Essential hypertension   . Arthritis   . Compression fracture of L5 lumbar vertebra ([place]) 09/20/2013  . Acute lumbar radiculopathy 09/20/2013  . Acute encephalopathy 09/19/2013  . Suicidal behavior 09/19/2013  . Hyperlipidemia   . Cataracts, bilateral     Past Surgical History:  Procedure Laterality Date  . CARDIAC CATHETERIZATION N/A 12/31/2016   Procedure: Left Heart Cath and Coronary Angiography;  Surgeon: Jettie Booze, MD;  Location: [facility] CV LAB;  Service: Cardiovascular;  Laterality: N/A;  . CARDIAC CATHETERIZATION  N/A 12/31/2016   Procedure: Coronary Stent Intervention;  Surgeon: Jettie Booze, MD;  Location: [facility] CV LAB;  Service: Cardiovascular;  Laterality: N/A;  . KYPHOPLASTY       OB History   None      Home Medications    Prior to Admission medications   Medication Sig Start Date End Date Taking? Authorizing Provider  aspirin 81 MG chewable tablet Chew 81 mg by mouth daily.    [provider]  carvedilol (COREG) 6.25 MG tablet Take 1 tablet (6.25 mg total) by mouth 2 (two) times daily with a meal. 01/02/17   Oswald Hillock, MD  cholecalciferol 4000 units TABS Take 4,000 Units by mouth  daily. Patient taking differently: Take 1,000 Units by mouth daily.  01/02/17   Oswald Hillock, MD  clopidogrel (PLAVIX) 75 MG tablet Take 1 tablet (75 mg total) by mouth daily. DO NOT START UNTIL FINISHED WITH BRILINTA 08/14/18 08/14/19  Richardson Dopp T, PA-C  HYDROcodone-acetaminophen (NORCO/VICODIN) 5-325 MG tablet Take 1 tablet by mouth every 6 (six) hours as needed. Patient taking differently: Take 1 tablet by mouth 3 (three) times daily as needed for moderate pain.  01/02/17   Oswald Hillock, MD  iron polysaccharides (NIFEREX) 150 MG capsule Take 1 capsule (150 mg total) by mouth daily. 09/21/13   Wenda Low, MD  losartan (COZAAR) 25 MG tablet Take 1 tablet (25 mg total) by mouth daily. 09/03/18   Richardson Dopp T, PA-C  metFORMIN (GLUCOPHAGE) 500 MG tablet Take 500 mg by mouth daily. with food 07/31/18   [provider]  olmesartan (BENICAR) 40 MG tablet Take 40 mg by mouth daily. 06/05/18   [provider]  pioglitazone (ACTOS) 45 MG tablet Take 45 mg by mouth daily.    [provider]  rosuvastatin (CRESTOR) 20 MG tablet Take 1 tablet (20 mg total) by mouth daily at 6 PM. 01/02/17   Oswald Hillock, MD    Family History Family History  Problem Relation Age of Onset  . Diabetes Mother     Social History Social History   Tobacco Use  . Smoking status: Never Smoker  . Smokeless tobacco: Never Used  Substance Use Topics  . Alcohol use: No  . Drug use: No     Allergies   Patient has no known allergies.   Review of Systems Review of Systems  Genitourinary: Positive for flank pain.  Musculoskeletal: Positive for back pain.  All other systems reviewed and are negative.    Physical Exam Updated Vital Signs BP (!) 162/78   Pulse 90   Temp 97.9 F (36.6 C) (Oral)   Resp 18   SpO2 98%   Physical Exam  Constitutional: She is oriented to person, place, and time. She appears well-developed and well-nourished.  HENT:  Head: Normocephalic and atraumatic.   Mouth/Throat: Oropharynx is clear and moist.  Eyes: Pupils are equal, round, and reactive to light. Conjunctivae and EOM are normal.  Neck: Normal range of motion.  Cardiovascular: Normal rate, regular rhythm and normal heart sounds.  Pulmonary/Chest: Effort normal and breath sounds normal. No stridor. No respiratory distress.  Abdominal: Soft. Bowel sounds are normal. There is no tenderness. There is CVA tenderness. There is no rebound.  Bilateral CVA tenderness  Musculoskeletal: Normal range of motion.  Well healed lumbar surgical scar, no apparent deformity or step-off noted; moving legs normally, normal tone  Neurological: She is alert and oriented to person, place, and time.  Skin:  Skin is warm and dry.  Psychiatric: She has a normal mood and affect.  Nursing note and vitals reviewed.    ED Treatments / Results  Labs (all labs ordered are listed, but only abnormal results are displayed) Labs Reviewed  CBC WITH DIFFERENTIAL/PLATELET - Abnormal; Notable for the following components:      Result Value   MCV 78.5 (*)    Neutro Abs 8.9 (*)    All other components within normal limits  URINALYSIS, ROUTINE W REFLEX MICROSCOPIC - Abnormal; Notable for the following components:   Color, Urine RED (*)    APPearance HAZY (*)    Glucose, UA >=500 (*)    Hgb urine dipstick SMALL (*)    Ketones, ur 5 (*)    Protein, ur >=300 (*)    Leukocytes, UA TRACE (*)    Bacteria, UA RARE (*)    All other components within normal limits  BASIC METABOLIC PANEL - Abnormal; Notable for the following components:   Sodium 129 (*)    Chloride 94 (*)    Glucose, Bld 300 (*)    Calcium 8.5 (*)    All other components within normal limits  I-STAT CG4 LACTIC ACID, ED - Abnormal; Notable for the following components:   Lactic Acid, Venous 3.21 (*)    All other components within normal limits  I-STAT CG4 LACTIC ACID, ED - Abnormal; Notable for the following components:   Lactic Acid, Venous 2.39 (*)      All other components within normal limits  URINE CULTURE  CULTURE, BLOOD (ROUTINE X 2)  CULTURE, BLOOD (ROUTINE X 2)  I-STAT CG4 LACTIC ACID, ED  I-STAT CG4 LACTIC ACID, ED    EKG None  Radiology Ct Renal Stone Study  Result Date: 11/03/2018 CLINICAL DATA:  Worsening right flank pain.  Urinary frequency. EXAM: CT ABDOMEN AND PELVIS WITHOUT CONTRAST TECHNIQUE: Multidetector CT imaging of the abdomen and pelvis was performed following the standard protocol without IV contrast. COMPARISON:  None. FINDINGS: Lower chest: No acute findings. Hepatobiliary: No mass visualized on this unenhanced exam. Gallbladder is unremarkable. Pancreas: No mass or inflammatory process visualized on this unenhanced exam. Spleen:  Within normal limits in size. Adrenals/Urinary tract: A small fluid attenuation right renal cyst is noted. No evidence of urolithiasis or hydronephrosis. Unremarkable unopacified urinary bladder. Stomach/Bowel: No evidence of obstruction, inflammatory process, or abnormal fluid collections. Normal appendix visualized. Vascular/Lymphatic: No pathologically enlarged lymph nodes identified. No evidence of abdominal aortic aneurysm. Aortic atherosclerosis. Reproductive:  No mass or other significant abnormality. Other:  None. Musculoskeletal: No suspicious bone lesions identified. Generalized osteopenia noted, with several old lumbar vertebral compression fractures and vertebroplasties. IMPRESSION: No acute findings. No evidence of ureteral calculi or hydronephrosis. Electronically Signed   By: Earle Gell M.D.   On: 11/03/2018 01:03    Procedures Procedures (including critical care time)  Medications Ordered in ED Medications  fentaNYL (SUBLIMAZE) injection 50 mcg (50 mcg Intravenous Given 11/02/18 2358)  sodium chloride 0.9 % bolus 1,000 mL (0 mLs Intravenous Stopped 11/03/18 0223)  sodium chloride 0.9 % bolus 1,000 mL (0 mLs Intravenous Stopped 11/03/18 0345)  cefTRIAXone (ROCEPHIN) 1 g  in sodium chloride 0.9 % 100 mL IVPB ( Intravenous Stopped 11/03/18 0253)  fentaNYL (SUBLIMAZE) injection 50 mcg (50 mcg Intravenous Given 11/03/18 0251)     Initial Impression / Assessment and Plan / ED Course  I have reviewed the triage vital signs and the nursing notes.  Pertinent labs & imaging results that were  available during my care of the patient were reviewed by me and considered in my medical decision making (see chart for details).  77 y.o. F here with back pain as well as urinary frequency.  She is afebrile, non-toxic.  Does have CVA tenderness.  No spinal tenderness, signs of trauma, or deformities.  She is neurologically intact without signs/symptoms suggestive of cauda equina.  Concern for early/developing pyelonephritis.  Labs obtained-- lactic acid elevated, however normal WBC count.  UA with some bacteria and leukocytes but remains nitrite negative.  CT renal study without acute findings.  Patient given IVF and Rocephin here, lactate cleared.  Remains hemodynamically stable.  She continues urinating frequently but denies dysuria and no noted hematuria.  Blood and urine cultures have been sent.  Remains afebrile, non-toxic.  Feel she is stable for outpatient management.  Will start keflex pending urine culture.  She has hydrocodone that she can take for pain.  Close follow-up with PCP.  Return here for any new/acute changes.    Of note, patient with HR documented of 122 at some point during ED visit.  Patient was re-checked, HR stable in the 80's.  Suspect she may have been moving around during this vitals check.  Case discussed with attending physician, Dr. Dina Rich, who agrees with assessment and plan of care.    Final Clinical Impressions(s) / ED Diagnoses   Final diagnoses:  Acute pyelonephritis  Flank pain  Urinary frequency    ED Discharge Orders         Ordered     11/03/18 0454    cephALEXin (KEFLEX) 500 MG capsule  2 times daily     11/03/18 0455             Larene Pickett, PA-C 11/03/18 9935    Merryl Hacker, MD 11/03/18 [phone]

## 2018-11-03 DIAGNOSIS — N10 Acute tubulo-interstitial nephritis: Secondary | ICD-10-CM | POA: Diagnosis not present

## 2018-11-03 LAB — CBC WITH DIFFERENTIAL/PLATELET
Abs Immature Granulocytes: 0.05 10*3/uL (ref 0.00–0.07)
BASOS ABS: 0 10*3/uL (ref 0.0–0.1)
Basophils Relative: 0 %
EOS ABS: 0 10*3/uL (ref 0.0–0.5)
EOS PCT: 0 %
HEMATOCRIT: 39.8 % (ref 36.0–46.0)
HEMOGLOBIN: 13.4 g/dL (ref 12.0–15.0)
Immature Granulocytes: 1 %
LYMPHS ABS: 0.9 10*3/uL (ref 0.7–4.0)
LYMPHS PCT: 9 %
MCH: 26.4 pg (ref 26.0–34.0)
MCHC: 33.7 g/dL (ref 30.0–36.0)
MCV: 78.5 fL — AB (ref 80.0–100.0)
MONOS PCT: 4 %
Monocytes Absolute: 0.4 10*3/uL (ref 0.1–1.0)
NRBC: 0 % (ref 0.0–0.2)
Neutro Abs: 8.9 10*3/uL — ABNORMAL HIGH (ref 1.7–7.7)
Neutrophils Relative %: 86 %
Platelets: 314 10*3/uL (ref 150–400)
RBC: 5.07 MIL/uL (ref 3.87–5.11)
RDW: 13.2 % (ref 11.5–15.5)
WBC: 10.2 10*3/uL (ref 4.0–10.5)

## 2018-11-03 LAB — URINALYSIS, ROUTINE W REFLEX MICROSCOPIC
BILIRUBIN URINE: NEGATIVE
Glucose, UA: >=500 mg/dL — AB
Ketones, ur: 5 mg/dL — AB
NITRITE: NEGATIVE
Protein, ur: >=300 mg/dL — AB
Specific Gravity, Urine: 1.023 (ref 1.005–1.030)
pH: 6 (ref 5.0–8.0)

## 2018-11-03 LAB — I-STAT CG4 LACTIC ACID, ED
LACTIC ACID, VENOUS: 2.39 mmol/L — AB (ref 0.5–1.9)
LACTIC ACID, VENOUS: 3.21 mmol/L — AB (ref 0.5–1.9)
Lactic Acid, Venous: 1.61 mmol/L (ref 0.5–1.9)

## 2018-11-03 LAB — BASIC METABOLIC PANEL
ANION GAP: 10 (ref 5–15)
BUN: 15 mg/dL (ref 8–23)
CHLORIDE: 94 mmol/L — AB (ref 98–111)
CO2: 25 mmol/L (ref 22–32)
Calcium: 8.5 mg/dL — ABNORMAL LOW (ref 8.9–10.3)
Creatinine, Ser: 0.67 mg/dL (ref 0.44–1.00)
GFR calc non Af Amer: >60 mL/min (ref >60)
Glucose, Bld: 300 mg/dL — ABNORMAL HIGH (ref 70–99)
Potassium: 4.9 mmol/L (ref 3.5–5.1)
SODIUM: 129 mmol/L — AB (ref 135–145)

## 2018-11-03 MED ORDER — SODIUM CHLORIDE 0.9 % IV BOLUS
1000.0000 mL | Freq: Once | INTRAVENOUS | Status: AC
  Administered 2018-11-03: 1000 mL via INTRAVENOUS

## 2018-11-03 MED ORDER — SODIUM CHLORIDE 0.9 % IV SOLN
1.0000 g | Freq: Once | INTRAVENOUS | Status: AC
  Administered 2018-11-03: 1 g via INTRAVENOUS
  Filled 2018-11-03: qty 10

## 2018-11-03 MED ORDER — FENTANYL CITRATE (PF) 100 MCG/2ML IJ SOLN
50.0000 ug | Freq: Once | INTRAMUSCULAR | Status: AC
  Administered 2018-11-03: 50 ug via INTRAVENOUS
  Filled 2018-11-03: qty 2

## 2018-11-03 MED ORDER — CEPHALEXIN 500 MG PO CAPS: 500.0000 mg | ORAL_CAPSULE | Freq: Two times a day (BID) | ORAL | 0 refills | Status: DC

## 2018-11-03 NOTE — ED Notes (Signed)
I gave critical I Stat CG4 results to PA [employer]

## 2018-11-03 NOTE — ED Notes (Signed)
I gave critical I Stat CG4 result to PA [employer]

## 2018-11-03 NOTE — Discharge Instructions (Addendum)
Take the prescribed medication as directed for urine infection.  Can continue taking pain medication as needed at home. Make sure to drink lots of fluids. Follow-up with your primary care doctor. Return to the ED for new or worsening symptoms.

## 2018-11-06 LAB — URINE CULTURE: Culture: >=100000 — AB

## 2018-11-07 ENCOUNTER — Telehealth: Payer: Self-pay

## 2018-11-07 NOTE — Telephone Encounter (Signed)
Post ED Visit - Positive Culture Follow-up  Culture report reviewed by antimicrobial stewardship pharmacist:  []  Amy Hood, Pharm.D. []  Amy Hood, Pharm.D., BCPS AQ-ID []  Amy Hood, Pharm.D., BCPS []  Amy Hood, Pharm.D., BCPS []  Amy Hood, [address] BoulevardPharm.D., BCPS, AAHIVP []  Amy Hood, Pharm.D., BCPS, AAHIVP [x]  Amy Hood, PharmD, BCPS []  Amy Hood, PharmD, BCPS []  Amy Hood, PharmD, BCPS []  Amy Hood, PharmD  Positive urine culture Treated with Cephalexin, organism sensitive to the same and no further patient follow-up is required at this time.  Amy Hood, Amy Hood 11/07/2018, 9:44 AM

## 2018-11-08 LAB — CULTURE, BLOOD (ROUTINE X 2)
CULTURE: NO GROWTH
Culture: NO GROWTH
Special Requests: ADEQUATE

## 2018-11-12 ENCOUNTER — Other Ambulatory Visit: Payer: Self-pay

## 2018-11-12 ENCOUNTER — Inpatient Hospital Stay (HOSPITAL_COMMUNITY): Payer: Medicare Other

## 2018-11-12 ENCOUNTER — Emergency Department (HOSPITAL_COMMUNITY): Payer: Medicare Other

## 2018-11-12 ENCOUNTER — Inpatient Hospital Stay (HOSPITAL_COMMUNITY)
Admission: EM | Admit: 2018-11-12 | Discharge: 2018-11-23 | DRG: 515 | Disposition: A | Discharge status: Home / Self Care | Payer: Medicare Other | Attending: Internal Medicine | Admitting: Internal Medicine

## 2018-11-12 DIAGNOSIS — E119 Type 2 diabetes mellitus without complications: Secondary | ICD-10-CM

## 2018-11-12 DIAGNOSIS — M479 Spondylosis, unspecified: Secondary | ICD-10-CM | POA: Diagnosis present

## 2018-11-12 DIAGNOSIS — I2510 Atherosclerotic heart disease of native coronary artery without angina pectoris: Secondary | ICD-10-CM | POA: Diagnosis present

## 2018-11-12 DIAGNOSIS — E872 Acidosis, unspecified: Secondary | ICD-10-CM

## 2018-11-12 DIAGNOSIS — Z833 Family history of diabetes mellitus: Secondary | ICD-10-CM | POA: Diagnosis not present

## 2018-11-12 DIAGNOSIS — I480 Paroxysmal atrial fibrillation: Secondary | ICD-10-CM | POA: Diagnosis present

## 2018-11-12 DIAGNOSIS — I10 Essential (primary) hypertension: Secondary | ICD-10-CM | POA: Diagnosis present

## 2018-11-12 DIAGNOSIS — K5900 Constipation, unspecified: Secondary | ICD-10-CM | POA: Diagnosis present

## 2018-11-12 DIAGNOSIS — M4854XA Collapsed vertebra, not elsewhere classified, thoracic region, initial encounter for fracture: Secondary | ICD-10-CM | POA: Diagnosis present

## 2018-11-12 DIAGNOSIS — Z9861 Coronary angioplasty status: Secondary | ICD-10-CM | POA: Diagnosis not present

## 2018-11-12 DIAGNOSIS — E870 Hyperosmolality and hypernatremia: Secondary | ICD-10-CM | POA: Diagnosis present

## 2018-11-12 DIAGNOSIS — R739 Hyperglycemia, unspecified: Secondary | ICD-10-CM

## 2018-11-12 DIAGNOSIS — S22050A Wedge compression fracture of T5-T6 vertebra, initial encounter for closed fracture: Secondary | ICD-10-CM | POA: Diagnosis not present

## 2018-11-12 DIAGNOSIS — Z7982 Long term (current) use of aspirin: Secondary | ICD-10-CM

## 2018-11-12 DIAGNOSIS — E871 Hypo-osmolality and hyponatremia: Secondary | ICD-10-CM | POA: Diagnosis present

## 2018-11-12 DIAGNOSIS — E785 Hyperlipidemia, unspecified: Secondary | ICD-10-CM | POA: Diagnosis present

## 2018-11-12 DIAGNOSIS — Z6835 Body mass index (BMI) 35.0-35.9, adult: Secondary | ICD-10-CM

## 2018-11-12 DIAGNOSIS — E669 Obesity, unspecified: Secondary | ICD-10-CM | POA: Diagnosis present

## 2018-11-12 DIAGNOSIS — M549 Dorsalgia, unspecified: Secondary | ICD-10-CM

## 2018-11-12 DIAGNOSIS — I252 Old myocardial infarction: Secondary | ICD-10-CM | POA: Diagnosis not present

## 2018-11-12 DIAGNOSIS — M5440 Lumbago with sciatica, unspecified side: Secondary | ICD-10-CM

## 2018-11-12 DIAGNOSIS — Z955 Presence of coronary angioplasty implant and graft: Secondary | ICD-10-CM | POA: Diagnosis not present

## 2018-11-12 DIAGNOSIS — S22050G Wedge compression fracture of T5-T6 vertebra, subsequent encounter for fracture with delayed healing: Secondary | ICD-10-CM | POA: Diagnosis not present

## 2018-11-12 DIAGNOSIS — H9190 Unspecified hearing loss, unspecified ear: Secondary | ICD-10-CM | POA: Diagnosis present

## 2018-11-12 DIAGNOSIS — Z7902 Long term (current) use of antithrombotics/antiplatelets: Secondary | ICD-10-CM

## 2018-11-12 DIAGNOSIS — E1165 Type 2 diabetes mellitus with hyperglycemia: Secondary | ICD-10-CM | POA: Diagnosis present

## 2018-11-12 DIAGNOSIS — S22060A Wedge compression fracture of T7-T8 vertebra, initial encounter for closed fracture: Secondary | ICD-10-CM | POA: Diagnosis not present

## 2018-11-12 DIAGNOSIS — M1990 Unspecified osteoarthritis, unspecified site: Secondary | ICD-10-CM | POA: Diagnosis present

## 2018-11-12 DIAGNOSIS — Z794 Long term (current) use of insulin: Secondary | ICD-10-CM | POA: Diagnosis not present

## 2018-11-12 DIAGNOSIS — E860 Dehydration: Secondary | ICD-10-CM | POA: Diagnosis present

## 2018-11-12 DIAGNOSIS — M48061 Spinal stenosis, lumbar region without neurogenic claudication: Secondary | ICD-10-CM | POA: Diagnosis present

## 2018-11-12 DIAGNOSIS — J9811 Atelectasis: Secondary | ICD-10-CM | POA: Diagnosis present

## 2018-11-12 DIAGNOSIS — G9341 Metabolic encephalopathy: Secondary | ICD-10-CM | POA: Diagnosis present

## 2018-11-12 LAB — CBC WITH DIFFERENTIAL/PLATELET
Abs Immature Granulocytes: 0.07 10*3/uL (ref 0.00–0.07)
BASOS ABS: 0 10*3/uL (ref 0.0–0.1)
Basophils Relative: 0 %
EOS PCT: 0 %
Eosinophils Absolute: 0 10*3/uL (ref 0.0–0.5)
HCT: 48.7 % — ABNORMAL HIGH (ref 36.0–46.0)
HEMOGLOBIN: 15.7 g/dL — AB (ref 12.0–15.0)
IMMATURE GRANULOCYTES: 1 %
LYMPHS PCT: 8 %
Lymphs Abs: 1 10*3/uL (ref 0.7–4.0)
MCH: 26.6 pg (ref 26.0–34.0)
MCHC: 32.2 g/dL (ref 30.0–36.0)
MCV: 82.5 fL (ref 80.0–100.0)
Monocytes Absolute: 0.7 10*3/uL (ref 0.1–1.0)
Monocytes Relative: 5 %
NEUTROS ABS: 11.3 10*3/uL — AB (ref 1.7–7.7)
NEUTROS PCT: 86 %
NRBC: 0 % (ref 0.0–0.2)
Platelets: 463 10*3/uL — ABNORMAL HIGH (ref 150–400)
RBC: 5.9 MIL/uL — AB (ref 3.87–5.11)
RDW: 13.4 % (ref 11.5–15.5)
WBC: 13.1 10*3/uL — ABNORMAL HIGH (ref 4.0–10.5)

## 2018-11-12 LAB — I-STAT TROPONIN, ED: TROPONIN I, POC: 0.06 ng/mL (ref 0.00–0.08)

## 2018-11-12 LAB — I-STAT CG4 LACTIC ACID, ED
LACTIC ACID, VENOUS: 2.51 mmol/L — AB (ref 0.5–1.9)
Lactic Acid, Venous: 2.75 mmol/L (ref 0.5–1.9)

## 2018-11-12 LAB — COMPREHENSIVE METABOLIC PANEL
ALBUMIN: 4.1 g/dL (ref 3.5–5.0)
ALT: 16 U/L (ref 0–44)
AST: 16 U/L (ref 15–41)
Alkaline Phosphatase: 82 U/L (ref 38–126)
Anion gap: 16 — ABNORMAL HIGH (ref 5–15)
BUN: 32 mg/dL — ABNORMAL HIGH (ref 8–23)
CHLORIDE: 99 mmol/L (ref 98–111)
CO2: 32 mmol/L (ref 22–32)
Calcium: 10 mg/dL (ref 8.9–10.3)
Creatinine, Ser: 0.9 mg/dL (ref 0.44–1.00)
GFR calc Af Amer: >60 mL/min (ref >60)
GFR calc non Af Amer: >60 mL/min (ref >60)
GLUCOSE: 585 mg/dL — AB (ref 70–99)
POTASSIUM: 4.5 mmol/L (ref 3.5–5.1)
SODIUM: 147 mmol/L — AB (ref 135–145)
TOTAL PROTEIN: 9.3 g/dL — AB (ref 6.5–8.1)
Total Bilirubin: 0.9 mg/dL (ref 0.3–1.2)

## 2018-11-12 LAB — BLOOD GAS, VENOUS
Acid-Base Excess: 5.7 mmol/L — ABNORMAL HIGH (ref 0.0–2.0)
Bicarbonate: 32.8 mmol/L — ABNORMAL HIGH (ref 20.0–28.0)
O2 Saturation: 34.5 %
PATIENT TEMPERATURE: 98.6
PH VEN: 7.373 (ref 7.250–7.430)
pCO2, Ven: 57.8 mmHg (ref 44.0–60.0)

## 2018-11-12 LAB — GLUCOSE, RANDOM: Glucose, Bld: 675 mg/dL (ref 70–99)

## 2018-11-12 LAB — PROTIME-INR
INR: 0.97
Prothrombin Time: 12.8 seconds (ref 11.4–15.2)

## 2018-11-12 LAB — CK: CK TOTAL: 34 U/L — AB (ref 38–234)

## 2018-11-12 MED ORDER — ACETAMINOPHEN 650 MG RE SUPP: 650.0000 mg | Freq: Four times a day (QID) | RECTAL | Status: DC | PRN

## 2018-11-12 MED ORDER — ONDANSETRON HCL 4 MG/2ML IJ SOLN: 4.0000 mg | Freq: Four times a day (QID) | INTRAMUSCULAR | Status: DC | PRN

## 2018-11-12 MED ORDER — SODIUM CHLORIDE 0.9 % IV BOLUS
1000.0000 mL | Freq: Once | INTRAVENOUS | Status: AC
  Administered 2018-11-12: 1000 mL via INTRAVENOUS

## 2018-11-12 MED ORDER — ACETAMINOPHEN 325 MG PO TABS
650.0000 mg | ORAL_TABLET | Freq: Four times a day (QID) | ORAL | Status: DC | PRN
  Administered 2018-11-18: 650 mg via ORAL
  Filled 2018-11-12: qty 2

## 2018-11-12 MED ORDER — VITAMIN D3 25 MCG (1000 UNIT) PO TABS
1000.0000 [IU] | ORAL_TABLET | Freq: Every day | ORAL | Status: DC
  Administered 2018-11-13 – 2018-11-23 (×11): 1000 [IU] via ORAL
  Filled 2018-11-12 (×12): qty 1

## 2018-11-12 MED ORDER — CLOPIDOGREL BISULFATE 75 MG PO TABS
75.0000 mg | ORAL_TABLET | Freq: Every day | ORAL | Status: DC
  Administered 2018-11-12 – 2018-11-13 (×2): 75 mg via ORAL
  Filled 2018-11-12 (×2): qty 1

## 2018-11-12 MED ORDER — MORPHINE SULFATE (PF) 2 MG/ML IV SOLN
2.0000 mg | INTRAVENOUS | Status: DC | PRN
  Administered 2018-11-12 – 2018-11-20 (×6): 2 mg via INTRAVENOUS
  Filled 2018-11-12 (×6): qty 1

## 2018-11-12 MED ORDER — ENOXAPARIN SODIUM 40 MG/0.4ML ~~LOC~~ SOLN
40.0000 mg | SUBCUTANEOUS | Status: DC
  Administered 2018-11-12 – 2018-11-18 (×7): 40 mg via SUBCUTANEOUS
  Filled 2018-11-12 (×7): qty 0.4

## 2018-11-12 MED ORDER — ROSUVASTATIN CALCIUM 20 MG PO TABS
20.0000 mg | ORAL_TABLET | Freq: Every day | ORAL | Status: DC
  Administered 2018-11-12 – 2018-11-22 (×11): 20 mg via ORAL
  Filled 2018-11-12 (×11): qty 1

## 2018-11-12 MED ORDER — SODIUM CHLORIDE 0.9 % IV SOLN
1.0000 g | Freq: Once | INTRAVENOUS | Status: AC
  Administered 2018-11-12: 1 g via INTRAVENOUS
  Filled 2018-11-12: qty 10

## 2018-11-12 MED ORDER — ASPIRIN 81 MG PO CHEW
81.0000 mg | CHEWABLE_TABLET | Freq: Every day | ORAL | Status: DC
  Administered 2018-11-12 – 2018-11-23 (×12): 81 mg via ORAL
  Filled 2018-11-12 (×13): qty 1

## 2018-11-12 MED ORDER — HYDROCODONE-ACETAMINOPHEN 5-325 MG PO TABS
1.0000 | ORAL_TABLET | Freq: Four times a day (QID) | ORAL | Status: DC | PRN
  Administered 2018-11-12 – 2018-11-18 (×6): 1 via ORAL
  Filled 2018-11-12 (×7): qty 1

## 2018-11-12 MED ORDER — INSULIN GLARGINE 100 UNIT/ML ~~LOC~~ SOLN
10.0000 [IU] | Freq: Every day | SUBCUTANEOUS | Status: DC
  Administered 2018-11-12 – 2018-11-13 (×2): 10 [IU] via SUBCUTANEOUS
  Filled 2018-11-12 (×3): qty 0.1

## 2018-11-12 MED ORDER — ONDANSETRON HCL 4 MG PO TABS: 4.0000 mg | ORAL_TABLET | Freq: Four times a day (QID) | ORAL | Status: DC | PRN

## 2018-11-12 MED ORDER — INSULIN ASPART 100 UNIT/ML ~~LOC~~ SOLN
25.0000 [IU] | Freq: Once | SUBCUTANEOUS | Status: AC
  Administered 2018-11-12: 25 [IU] via SUBCUTANEOUS

## 2018-11-12 MED ORDER — SODIUM CHLORIDE 0.9 % IV SOLN
INTRAVENOUS | Status: DC | PRN
  Administered 2018-11-12 – 2018-11-19 (×3): 1000 mL via INTRAVENOUS
  Administered 2018-11-23: 500 mL via INTRAVENOUS

## 2018-11-12 MED ORDER — INSULIN ASPART 100 UNIT/ML ~~LOC~~ SOLN
0.0000 [IU] | Freq: Three times a day (TID) | SUBCUTANEOUS | Status: DC
  Administered 2018-11-13: 5 [IU] via SUBCUTANEOUS
  Administered 2018-11-13 – 2018-11-14 (×2): 3 [IU] via SUBCUTANEOUS
  Administered 2018-11-14 – 2018-11-15 (×3): 5 [IU] via SUBCUTANEOUS
  Administered 2018-11-15: 7 [IU] via SUBCUTANEOUS
  Administered 2018-11-15: 3 [IU] via SUBCUTANEOUS

## 2018-11-12 MED ORDER — INSULIN ASPART 100 UNIT/ML IV SOLN
10.0000 [IU] | Freq: Once | INTRAVENOUS | Status: AC
  Administered 2018-11-12: 10 [IU] via INTRAVENOUS
  Filled 2018-11-12: qty 0.1

## 2018-11-12 MED ORDER — CARVEDILOL 6.25 MG PO TABS
6.2500 mg | ORAL_TABLET | Freq: Two times a day (BID) | ORAL | Status: DC
  Administered 2018-11-12 – 2018-11-23 (×22): 6.25 mg via ORAL
  Filled 2018-11-12 (×22): qty 1

## 2018-11-12 MED ORDER — POLYSACCHARIDE IRON COMPLEX 150 MG PO CAPS
150.0000 mg | ORAL_CAPSULE | Freq: Every day | ORAL | Status: DC
  Administered 2018-11-13 – 2018-11-23 (×11): 150 mg via ORAL
  Filled 2018-11-12 (×12): qty 1

## 2018-11-12 MED ORDER — SODIUM CHLORIDE 0.9 % IV SOLN
INTRAVENOUS | Status: DC
  Administered 2018-11-12 – 2018-11-13 (×2): via INTRAVENOUS

## 2018-11-12 NOTE — ED Notes (Signed)
Blood cultures x2 collected prior to starting antibiotics.  

## 2018-11-12 NOTE — ED Notes (Signed)
ED TO INPATIENT HANDOFF REPORT  Name/Age/Gender Amy Hood 77 y.o. female  Code Status Code Status History    Date Active Date Inactive Code Status Order ID Comments User Context   12/28/2016 1823 01/02/2017 2202 Full Code [id number]  Murlean Iba, MD ED   09/19/2013 0129 09/21/2013 1538 Full Code [id number]  Rise Patience, MD Inpatient      Home/SNF/Other Home  Chief Complaint Possible Sepsis; Fever   Level of Care/Admitting Diagnosis ED Disposition    ED Disposition Condition [facility] Area: [facility] [[id number]]  Level of Care: Med-Surg [16]  Diagnosis: Hyperglycemia [128786]  Admitting Physician: Tawni Millers [[id number]]  Attending Physician: Tawni Millers [[id number]]  Estimated length of stay: 3 - 4 days  Certification:: I certify this patient will need inpatient services for at least 2 midnights  PT Class (Do Not Modify): Inpatient [101]  PT Acc Code (Do Not Modify): Private [1]       Medical History Past Medical History:  Diagnosis Date  . Anemia   . Arthritis   . CAD (coronary artery disease) 12/28/2016   S/p NSTEMI 1/18:  LHC - oLAD, mLAD 80, pLCx 75, mRCA 25 >> PCI:  3 x 24 mm Synergy DES to mid LAD; 3.5 x 28 mm Synergy DES to proximal LCx // Echo 1/18:  Mild LVH, EF 55-60, apical inf and apical HK, Gr 1 DD, AV mean 13, MAC, trivial MR, reduced RVSF, trivial TR, PASP 35, no eff  . Cataracts, bilateral   . Essential hypertension   . Generalized weakness   . Hard of hearing   . History of atrial fibrillation    post MI - likely related to acute medical illness therefore anticoag not started  . Hyperlipidemia   . Hyponatremia   . Mild aortic stenosis 12/30/2016   Echo 1/18:  Mild LVH, EF 55-60, apical inf and apical HK, Gr 1 DD, AV mean 13, MAC, trivial MR, reduced RVSF, trivial TR, PASP 35, no eff // Echo 09/02/2018:  Severe focal basal septal hypertrophy, EF 60-65, no RWMA, Gr 2 DD, very mild AS  (mean 11), mild LAE   . Type 2 diabetes mellitus ([place])     Allergies No Known Allergies  IV Location/Drains/Wounds Patient Lines/Drains/Airways Status   Active Line/Drains/Airways    Name:   Placement date:   Placement time:   Site:   Days:   Peripheral IV 11/02/18 Right Hand   11/02/18    2356    Hand   10   Peripheral IV 11/03/18 Left Antecubital   11/03/18    0115    Antecubital   9   Peripheral IV 11/12/18 Right Wrist   11/12/18    -    Wrist   less than 1   Peripheral IV 11/12/18 Left Antecubital   11/12/18    1225    Antecubital   less than 1   Pressure Injury 12/31/16 Stage I -  Intact skin with non-blanchable redness of a localized area usually over a bony prominence. baseball sized area of redness   12/31/16    1702     681          Labs/Imaging Results for orders placed or performed during the hospital encounter of 11/12/18 (from the past 48 hour(s))  Culture, blood (Routine x 2)     Status: None (Preliminary result)   Collection Time: 11/12/18 12:18 PM  Result Value Ref Range  Specimen Description      BLOOD LEFT ANTECUBITAL Performed at [facility] [address] [place], [place] [address]    Special Requests      BOTTLES DRAWN AEROBIC AND ANAEROBIC Blood Culture adequate volume Performed at [facility], [place] [address], [place], [place] [address]    Culture PENDING    Report Status PENDING   Comprehensive metabolic panel     Status: Abnormal   Collection Time: 11/12/18 12:30 PM  Result Value Ref Range   Sodium 147 (H) 135 - 145 mmol/L   Potassium 4.5 3.5 - 5.1 mmol/L   Chloride 99 98 - 111 mmol/L   CO2 32 22 - 32 mmol/L   Glucose, Bld 585 (HH) 70 - 99 mg/dL    Comment: CRITICAL RESULT CALLED TO, READ BACK BY AND VERIFIED WITH: BEAULAURIER,E. RN @1309  ON 12.05.19 BY COHEN,K    BUN 32 (H) 8 - 23 mg/dL   Creatinine, Ser 0.90 0.44 - 1.00 mg/dL   Calcium 10.0 8.9 - 10.3 mg/dL   Total Protein 9.3 (H) 6.5 - 8.1 g/dL   Albumin 4.1  3.5 - 5.0 g/dL   AST 16 15 - 41 U/L   ALT 16 0 - 44 U/L   Alkaline Phosphatase 82 38 - 126 U/L   Total Bilirubin 0.9 0.3 - 1.2 mg/dL   GFR calc non Af Amer >60 >60 mL/min   GFR calc Af Amer >60 >60 mL/min   Anion gap 16 (H) 5 - 15    Comment: Performed at [facility], [place] [address] [place], [place] [address]  CBC with Differential     Status: Abnormal   Collection Time: 11/12/18 12:30 PM  Result Value Ref Range   WBC 13.1 (H) 4.0 - 10.5 K/uL   RBC 5.90 (H) 3.87 - 5.11 MIL/uL   Hemoglobin 15.7 (H) 12.0 - 15.0 g/dL   HCT 48.7 (H) 36.0 - 46.0 %   MCV 82.5 80.0 - 100.0 fL   MCH 26.6 26.0 - 34.0 pg   MCHC 32.2 30.0 - 36.0 g/dL   RDW 13.4 11.5 - 15.5 %   Platelets 463 (H) 150 - 400 K/uL   nRBC 0.0 0.0 - 0.2 %   Neutrophils Relative % 86 %   Neutro Abs 11.3 (H) 1.7 - 7.7 K/uL   Lymphocytes Relative 8 %   Lymphs Abs 1.0 0.7 - 4.0 K/uL   Monocytes Relative 5 %   Monocytes Absolute 0.7 0.1 - 1.0 K/uL   Eosinophils Relative 0 %   Eosinophils Absolute 0.0 0.0 - 0.5 K/uL   Basophils Relative 0 %   Basophils Absolute 0.0 0.0 - 0.1 K/uL   Immature Granulocytes 1 %   Abs Immature Granulocytes 0.07 0.00 - 0.07 K/uL    Comment: Performed at [facility], [place] [address], [place], [place] [address]  Protime-INR     Status: None   Collection Time: 11/12/18 12:30 PM  Result Value Ref Range   Prothrombin Time 12.8 11.4 - 15.2 seconds   INR 0.97     Comment: Performed at [facility], [place] [address], [place], [place] [address]  CK     Status: Abnormal   Collection Time: 11/12/18 12:30 PM  Result Value Ref Range   Total CK 34 (L) 38 - 234 U/L    Comment: Performed at [facility], [place] [address] [place],  [address]  I-Stat CG4 Lactic Acid, ED  Status: Abnormal   Collection Time: 11/12/18 12:35 PM  Result Value Ref Range   Lactic Acid, Venous 2.75 (HH) 0.5 - 1.9 mmol/L   Comment NOTIFIED PHYSICIAN    I-Stat CG4 Lactic Acid, ED     Status: Abnormal   Collection Time: 11/12/18  1:58 PM  Result Value Ref Range   Lactic Acid, Venous 2.51 (HH) 0.5 - 1.9 mmol/L   Comment NOTIFIED PHYSICIAN   Blood gas, venous     Status: Abnormal   Collection Time: 11/12/18  2:10 PM  Result Value Ref Range   pH, Ven 7.373 7.250 - 7.430   pCO2, Ven 57.8 44.0 - 60.0 mmHg   pO2, Ven below reportable range 32.0 - 45.0 mmHg    Comment: rbv karrar husain md by angie dunlap rrt rcp on 11/12/18 at 1413   Bicarbonate 32.8 (H) 20.0 - 28.0 mmol/L   Acid-Base Excess 5.7 (H) 0.0 - 2.0 mmol/L   O2 Saturation 34.5 %   Patient temperature 98.6    Collection site VEIN    Drawn by DRAWN BY RN    Sample type VENOUS     Comment: Performed at [facility], [place] [address] [place], [place] [address]  I-stat troponin, ED     Status: None   Collection Time: 11/12/18  2:37 PM  Result Value Ref Range   Troponin i, poc 0.06 0.00 - 0.08 ng/mL   Comment 3            Comment: Due to the release kinetics of cTnI, a negative result within the first hours of the onset of symptoms does not rule out myocardial infarction with certainty. If myocardial infarction is still suspected, repeat the test at appropriate intervals.    Dg Chest 2 View  Result Date: 11/12/2018 CLINICAL DATA:  Labored breathing for several days. EXAM: CHEST - 2 VIEW COMPARISON:  Chest radiograph 07/22/2018 and priors. FINDINGS: The cardiac silhouette is enlarged. Calcific atherosclerotic disease and tortuosity of the aorta. There is no evidence of focal airspace consolidation, pleural effusion or pneumothorax. Mild pulmonary vascular congestion. Compression deformity of T7 vertebral body, new from the most recent lateral chest radiograph dated 12/28/2016. Soft tissues are grossly normal. IMPRESSION: Enlarged cardiac silhouette with calcific atherosclerotic disease of the aorta and mild pulmonary vascular congestion. New from January 2018 T7  compression fracture with approximately 50% height loss. Electronically Signed   By: Fidela Salisbury M.D.   On: 11/12/2018 13:14   Dg Chest Port 1 View  Result Date: 11/12/2018 CLINICAL DATA:  Dyspnea.  Fever and pain. EXAM: PORTABLE CHEST 1 VIEW COMPARISON:  11/12/2018 at 1245 hours FINDINGS: Portable semi upright view of the chest was obtained. Linear densities in the left lower chest are similar to the recent comparison examination could represent scarring or atelectasis. Slightly prominent lung markings appear chronic. Old fracture of the right sixth rib. Heart size is within normal limits stable. Atherosclerotic calcifications at the aortic arch. Negative for a pneumothorax. IMPRESSION: Stable chest radiograph findings from the recent comparison examination. Linear densities in left lower chest are suggestive for subsegmental atelectasis versus scarring. Electronically Signed   By: Markus Daft M.D.   On: 11/12/2018 13:43    Pending Labs Unresulted Labs (From admission, onward)    Start     Ordered   11/12/18 1213  Culture, blood (Routine x 2)  BLOOD CULTURE X 2,   STAT     11/12/18 1213   11/12/18 1213  Urinalysis, Routine w  reflex microscopic  ONCE - STAT,   STAT     11/12/18 1213   Signed and Held  CBC  (enoxaparin (LOVENOX)    CrCl < 30 ml/min)  Once,   R    Comments:  Baseline for enoxaparin therapy IF NOT ALREADY DRAWN.  Notify MD if PLT < 100 K.    Signed and Held   Signed and Held  Creatinine, serum  (enoxaparin (LOVENOX)    CrCl < 30 ml/min)  Once,   R    Comments:  Baseline for enoxaparin therapy IF NOT ALREADY DRAWN.    Signed and Held   Signed and Held  Creatinine, serum  (enoxaparin (LOVENOX)    CrCl < 30 ml/min)  Weekly,   R    Comments:  while on enoxaparin therapy.    Signed and Held   Signed and Held  Basic metabolic panel  Tomorrow morning,   R     Signed and Held   Signed and Held  CBC  Tomorrow morning,   R     Signed and Held           Vitals/Pain Today's Vitals   11/12/18 1207 11/12/18 1257 11/12/18 1330 11/12/18 1505  BP:   (!) 164/85 120/78  Pulse: (P) 98  94 94  Resp:   (!) 24 (!) 29  SpO2:   93% 98%  Weight:  73.2 kg    Height:  4' 9"  (1.448 m)      Isolation Precautions No active isolations  Medications Medications  0.9 %  sodium chloride infusion (1,000 mLs Intravenous New Bag/Given 11/12/18 1402)  sodium chloride 0.9 % bolus 1,000 mL (0 mLs Intravenous Stopped 11/12/18 1401)  insulin aspart (novoLOG) injection 10 Units (10 Units Intravenous Given 11/12/18 1345)  cefTRIAXone (ROCEPHIN) 1 g in sodium chloride 0.9 % 100 mL IVPB (1 g Intravenous New Bag/Given 11/12/18 1404)    Mobility non-ambulatory

## 2018-11-12 NOTE — ED Provider Notes (Signed)
[facility] DEPT Provider Note   CSN: [id number] Arrival date & time: 11/12/18  1150     History   Chief Complaint Chief Complaint  Patient presents with  . Fever  . Urinary Tract Infection    HPI Sahian Kerney is a 77 y.o. female.  77 year old female with prior medical history as presented below presents for evaluation of decreased p.o. intake and persistent back pain.  Patient was seen here on the 25th of last month.  She was diagnosed with a UTI.  She was given a dose of Rocephin in the ED and then discharged with Keflex.  She returns now with her daughter.  Her daughter provides majority of history.  Patient is [place] speaking only.  Her daughter reports that the patient has worsened over the last several days.  She is not getting out of bed.  She has minimal p.o. intake.  She complains of persistent back pain.  She has been compliant with the previously prescribed antibiotics.  The history is provided by the patient, medical records and a relative.  Illness  This is a new problem. The current episode started more than 1 week ago. The problem occurs constantly. The problem has not changed since onset.Pertinent negatives include no chest pain, no abdominal pain, no headaches and no shortness of breath. Nothing aggravates the symptoms. Nothing relieves the symptoms.    Past Medical History:  Diagnosis Date  . Anemia   . Arthritis   . CAD (coronary artery disease) 12/28/2016   S/p NSTEMI 1/18:  LHC - oLAD, mLAD 80, pLCx 75, mRCA 25 >> PCI:  3 x 24 mm Synergy DES to mid LAD; 3.5 x 28 mm Synergy DES to proximal LCx // Echo 1/18:  Mild LVH, EF 55-60, apical inf and apical HK, Gr 1 DD, AV mean 13, MAC, trivial MR, reduced RVSF, trivial TR, PASP 35, no eff  . Cataracts, bilateral   . Essential hypertension   . Generalized weakness   . Hard of hearing   . History of atrial fibrillation    post MI - likely related to acute medical illness therefore  anticoag not started  . Hyperlipidemia   . Hyponatremia   . Mild aortic stenosis 12/30/2016   Echo 1/18:  Mild LVH, EF 55-60, apical inf and apical HK, Gr 1 DD, AV mean 13, MAC, trivial MR, reduced RVSF, trivial TR, PASP 35, no eff // Echo 09/02/2018:  Severe focal basal septal hypertrophy, EF 60-65, no RWMA, Gr 2 DD, very mild AS (mean 11), mild LAE   . Type 2 diabetes mellitus [facility])     Patient Active Problem List   Diagnosis Date Noted  . Type 2 diabetes mellitus without complication ([place]) 34/19/3790  . Pressure injury of skin 12/31/2016  . Mild aortic stenosis 12/30/2016  . NSTEMI (non-ST elevated myocardial infarction) ([place]) 12/28/2016  . CAD (coronary artery disease) 12/28/2016  . Abnormal ECG 12/28/2016  . Generalized weakness 12/28/2016  . Non-English speaking patient 12/28/2016  . Thrombocytopenia ([place]) 12/28/2016  . PAF (paroxysmal atrial fibrillation) ([place]) 12/28/2016  . Hard of hearing 12/28/2016  . Essential hypertension   . Arthritis   . Compression fracture of L5 lumbar vertebra ([place]) 09/20/2013  . Acute lumbar radiculopathy 09/20/2013  . Acute encephalopathy 09/19/2013  . Suicidal behavior 09/19/2013  . Hyperlipidemia   . Cataracts, bilateral     Past Surgical History:  Procedure Laterality Date  . CARDIAC CATHETERIZATION N/A 12/31/2016   Procedure: Left Heart  Cath and Coronary Angiography;  Surgeon: Jettie Booze, MD;  Location: [facility] LAB;  Service: Cardiovascular;  Laterality: N/A;  . CARDIAC CATHETERIZATION N/A 12/31/2016   Procedure: Coronary Stent Intervention;  Surgeon: Jettie Booze, MD;  Location: [facility];  Service: Cardiovascular;  Laterality: N/A;  . KYPHOPLASTY       OB History   None      Home Medications    Prior to Admission medications   Medication Sig Start Date End Date Taking? Authorizing Provider  aspirin 81 MG chewable tablet Chew 81 mg by mouth daily.   Yes [provider]  carvedilol (COREG)  6.25 MG tablet Take 1 tablet (6.25 mg total) by mouth 2 (two) times daily with a meal. 01/02/17  Yes Darrick Meigs, Marge Duncans, MD  cephALEXin (KEFLEX) 500 MG capsule Take 1 capsule (500 mg total) by mouth 2 (two) times daily. 11/03/18  Yes Larene Pickett, PA-C  cholecalciferol (VITAMIN D3) 25 MCG (1000 UT) tablet Take 1,000 Units by mouth daily.   Yes [provider]  clopidogrel (PLAVIX) 75 MG tablet Take 1 tablet (75 mg total) by mouth daily. DO NOT START UNTIL FINISHED WITH [place] 08/14/18 08/14/19 Yes Weaver, Scott T, PA-C  HYDROcodone-acetaminophen (NORCO/VICODIN) 5-325 MG tablet Take 1 tablet by mouth every 6 (six) hours as needed. Patient taking differently: Take 1 tablet by mouth 3 (three) times daily.  01/02/17  Yes Oswald Hillock, MD  iron polysaccharides (NIFEREX) 150 MG capsule Take 1 capsule (150 mg total) by mouth daily. 09/21/13  Yes Wenda Low, MD  losartan (COZAAR) 25 MG tablet Take 1 tablet (25 mg total) by mouth daily. 09/03/18  Yes Weaver, Scott T, PA-C  metFORMIN (GLUCOPHAGE) 500 MG tablet Take 500 mg by mouth daily with breakfast. with food 07/31/18  Yes [provider]  rosuvastatin (CRESTOR) 20 MG tablet Take 1 tablet (20 mg total) by mouth daily at 6 PM. 01/02/17  Yes Darrick Meigs, Marge Duncans, MD  acetaminophen (TYLENOL) 500 MG tablet Take 1,000 mg by mouth every 6 (six) hours as needed for mild pain.    [provider]  cholecalciferol 4000 units TABS Take 4,000 Units by mouth daily. Patient not taking: Reported on 11/12/2018 01/02/17   Oswald Hillock, MD    Family History Family History  Problem Relation Age of Onset  . Diabetes Mother     Social History Social History   Tobacco Use  . Smoking status: Never Smoker  . Smokeless tobacco: Never Used  Substance Use Topics  . Alcohol use: No  . Drug use: No     Allergies   Patient has no known allergies.   Review of Systems Review of Systems  Respiratory: Negative for shortness of breath.   Cardiovascular:  Negative for chest pain.  Gastrointestinal: Negative for abdominal pain.  Neurological: Negative for headaches.  All other systems reviewed and are negative.    Physical Exam Updated Vital Signs BP (!) 164/85   Pulse 94   Resp (!) 24   Ht _0  (1.448 m)   Wt 73.2 kg   SpO2 93%   BMI 34.92 kg/m   Physical Exam  Constitutional: She is oriented to person, place, and time. She appears well-developed and well-nourished. No distress.  HENT:  Head: Normocephalic and atraumatic.  Mouth/Throat: Oropharynx is clear and moist.  Eyes: Pupils are equal, round, and reactive to light. Conjunctivae and EOM are normal.  Neck: Normal range of motion. Neck supple.  Cardiovascular: Normal rate, regular rhythm and normal heart sounds.  Pulmonary/Chest: Effort normal and breath sounds normal. No respiratory distress.  Abdominal: Soft. She exhibits no distension. There is no tenderness.  Musculoskeletal: Normal range of motion. She exhibits no edema or deformity.  Neurological: She is alert and oriented to person, place, and time.  Skin: Skin is warm and dry.  Psychiatric: She has a normal mood and affect.  Nursing note and vitals reviewed.    ED Treatments / Results  Labs (all labs ordered are listed, but only abnormal results are displayed) Labs Reviewed  COMPREHENSIVE METABOLIC PANEL - Abnormal; Notable for the following components:      Result Value   Sodium 147 (*)    Glucose, Bld 585 (*)    BUN 32 (*)    Total Protein 9.3 (*)    Anion gap 16 (*)    All other components within normal limits  CBC WITH DIFFERENTIAL/PLATELET - Abnormal; Notable for the following components:   WBC 13.1 (*)    RBC 5.90 (*)    Hemoglobin 15.7 (*)    HCT 48.7 (*)    Platelets 463 (*)    Neutro Abs 11.3 (*)    All other components within normal limits  CK - Abnormal; Notable for the following components:   Total CK 34 (*)    All other components within normal limits  BLOOD GAS, VENOUS - Abnormal;  Notable for the following components:   Bicarbonate 32.8 (*)    Acid-Base Excess 5.7 (*)    All other components within normal limits  I-STAT CG4 LACTIC ACID, ED - Abnormal; Notable for the following components:   Lactic Acid, Venous 2.75 (*)    All other components within normal limits  I-STAT CG4 LACTIC ACID, ED - Abnormal; Notable for the following components:   Lactic Acid, Venous 2.51 (*)    All other components within normal limits  CULTURE, BLOOD (ROUTINE X 2)  CULTURE, BLOOD (ROUTINE X 2)  PROTIME-INR  URINALYSIS, ROUTINE W REFLEX MICROSCOPIC  I-STAT TROPONIN, ED    EKG EKG Interpretation  Date/Time:  Thursday November 12 2018 12:13:42 EST Ventricular Rate:  97 PR Interval:    QRS Duration: 92 QT Interval:  355 QTC Calculation: 451 R Axis:   17 Text Interpretation:  Sinus tachycardia Ventricular bigeminy Abnormal R-wave progression, early transition Nonspecific repol abnormality, anterior leads Confirmed by Dene Gentry [phone]) on 11/12/2018 12:40:11 PM   Radiology Dg Chest 2 View  Result Date: 11/12/2018 CLINICAL DATA:  Labored breathing for several days. EXAM: CHEST - 2 VIEW COMPARISON:  Chest radiograph 07/22/2018 and priors. FINDINGS: The cardiac silhouette is enlarged. Calcific atherosclerotic disease and tortuosity of the aorta. There is no evidence of focal airspace consolidation, pleural effusion or pneumothorax. Mild pulmonary vascular congestion. Compression deformity of T7 vertebral body, new from the most recent lateral chest radiograph dated 12/28/2016. Soft tissues are grossly normal. IMPRESSION: Enlarged cardiac silhouette with calcific atherosclerotic disease of the aorta and mild pulmonary vascular congestion. New from January 2018 T7 compression fracture with approximately 50% height loss. Electronically Signed   By: Fidela Salisbury M.D.   On: 11/12/2018 13:14   Dg Chest Port 1 View  Result Date: 11/12/2018 CLINICAL DATA:  Dyspnea.  Fever and pain.  EXAM: PORTABLE CHEST 1 VIEW COMPARISON:  11/12/2018 at 1245 hours FINDINGS: Portable semi upright view of the chest was obtained. Linear densities in the left lower chest are similar to the recent comparison examination could represent scarring  or atelectasis. Slightly prominent lung markings appear chronic. Old fracture of the right sixth rib. Heart size is within normal limits stable. Atherosclerotic calcifications at the aortic arch. Negative for a pneumothorax. IMPRESSION: Stable chest radiograph findings from the recent comparison examination. Linear densities in left lower chest are suggestive for subsegmental atelectasis versus scarring. Electronically Signed   By: Markus Daft M.D.   On: 11/12/2018 13:43    Procedures Procedures (including critical care time)  Medications Ordered in ED Medications  0.9 %  sodium chloride infusion (1,000 mLs Intravenous New Bag/Given 11/12/18 1402)  sodium chloride 0.9 % bolus 1,000 mL (0 mLs Intravenous Stopped 11/12/18 1401)  insulin aspart (novoLOG) injection 10 Units (10 Units Intravenous Given 11/12/18 1345)  cefTRIAXone (ROCEPHIN) 1 g in sodium chloride 0.9 % 100 mL IVPB (1 g Intravenous New Bag/Given 11/12/18 1404)     Initial Impression / Assessment and Plan / ED Course  I have reviewed the triage vital signs and the nursing notes.  Pertinent labs & imaging results that were available during my care of the patient were reviewed by me and considered in my medical decision making (see chart for details).     MDM  Screen complete   Patient is presenting for dilation of decreased p.o. intake and worsening weakness.  Patient is clinically dehydrated with evidence of same on her labs.  She does have a moderately elevated glucose with an anion gap of 16. Sodium is 147.   Patient given a dose of Rocephin in the ED for treatment of presumed UTI.   Patient would benefit from further inpatient treatment.  Hospitalist service is aware of case and will  evaluate the patient for admission.   Final Clinical Impressions(s) / ED Diagnoses   Final diagnoses:  Dehydration  Hyperglycemia  Lactic acid acidosis    ED Discharge Orders    None       Valarie Merino, MD 11/12/18 1513

## 2018-11-12 NOTE — ED Notes (Signed)
Daughter reports pt being seen here a few days ago, "diagnosed with infection" "having pain that never went away" "today it has gotten bad, she won't let me turn her,....cant get out of bed".  Reports pt "felt wet, may have had a fever" a few days ago.

## 2018-11-12 NOTE — ED Notes (Signed)
Bed: [id number] Expected date:  Expected time:  Means of arrival:  Comments: EMS-UTI/sepsis

## 2018-11-12 NOTE — Plan of Care (Signed)
Plan of care reviewed and discussed with patient's  family.

## 2018-11-12 NOTE — ED Notes (Signed)
Patient transported to X-ray 

## 2018-11-12 NOTE — ED Notes (Signed)
I gave critical I stat CG4 result to [facility]

## 2018-11-12 NOTE — Progress Notes (Signed)
PAGED MD FOR CBG >600 - GIVING 25UNITS OF NOVOLOG - PER MD RECHECK AT 1AM - IF STILL HIGH WILL TRANSFER TO STEP DOWN FOR INSULIN DRIP

## 2018-11-12 NOTE — ED Triage Notes (Signed)
Pt BIB EMS from home.  Family called with c/o fever, pain.  Pt was seen in ED 2 days ago and diagnosed with UTI, sent home with keflex.  Family states pt could not get out of bed today d/t pain in back.  Pt incontinent today d/t pain.  Fever started today.   Pt received 100 mcg fentanyl en route, 300 mL NaCl.  \  O2 sats 88 on RA., 100% 4L by [place].

## 2018-11-12 NOTE — H&P (Signed)
History and Physical    Amy Hood XIH:[id number] DOB: September 15, 1941 DOA: 11/12/2018  PCP: Wenda Low, MD   Patient coming from: Home   Chief Complaint: back pain and altered mental status.   HPI: Amy Hood is a 77 y.o. female with medical history significant of chronic arthritis, coronary artery disease, atrial fibrillation, dyslipidemia, hypertension, and type 2 diabetes mellitus.  Patient presents with severe lower back pain, sharp in nature, severe in intensity, worse with movement, no improving factors, associated ambulatory dysfunction, poor appetite, and confusion.  Patient was seen November 25 in the emergency department for the same complaint, she was diagnosed with a urinary tract infection and was prescribed cephalexin.  She did complete a course of oral antibiotics at home with no improvement of her symptoms.   For the last 10 days patient has been in bedridden due to severe back pain, had difficulty seating that has affected her p.o. Intake. Patient usually ambulates with a walker.  She takes opiates for pain control.  All the information is obtained through her daughter due to patient's language barriers.    ED Course: Patient was found ill looking appearing, deconditioned, hyperglycemic and somnolent.  Received IV fluids, IV antibiotics and referred for admission.  Review of Systems: Limited, all information from her daughter (translating) 1. General: subjective fevers and chills, no weight gain or weight loss 2. ENT: No runny nose or sore throat, no hearing disturbances 3. Pulmonary: No dyspnea, cough, wheezing, or hemoptysis 4. Cardiovascular: No angina, claudication, lower extremity edema, pnd or orthopnea 5. Gastrointestinal: poor appetite, No nausea or vomiting, no diarrhea or constipation 6. Hematology: No easy bruisability or frequent infections 7. Urology: No dysuria, hematuria or increased urinary frequency 8. Dermatology: No rashes. 9. Neurology: No seizures  or paresthesias 10. Musculoskeletal: Positive back pain as mentioned in HPI.   Past Medical History:  Diagnosis Date  . Anemia   . Arthritis   . CAD (coronary artery disease) 12/28/2016   S/p NSTEMI 1/18:  LHC - oLAD, mLAD 80, pLCx 75, mRCA 25 >> PCI:  3 x 24 mm Synergy DES to mid LAD; 3.5 x 28 mm Synergy DES to proximal LCx // Echo 1/18:  Mild LVH, EF 55-60, apical inf and apical HK, Gr 1 DD, AV mean 13, MAC, trivial MR, reduced RVSF, trivial TR, PASP 35, no eff  . Cataracts, bilateral   . Essential hypertension   . Generalized weakness   . Hard of hearing   . History of atrial fibrillation    post MI - likely related to acute medical illness therefore anticoag not started  . Hyperlipidemia   . Hyponatremia   . Mild aortic stenosis 12/30/2016   Echo 1/18:  Mild LVH, EF 55-60, apical inf and apical HK, Gr 1 DD, AV mean 13, MAC, trivial MR, reduced RVSF, trivial TR, PASP 35, no eff // Echo 09/02/2018:  Severe focal basal septal hypertrophy, EF 60-65, no RWMA, Gr 2 DD, very mild AS (mean 11), mild LAE   . Type 2 diabetes mellitus ([place])     Past Surgical History:  Procedure Laterality Date  . CARDIAC CATHETERIZATION N/A 12/31/2016   Procedure: Left Heart Cath and Coronary Angiography;  Surgeon: Jettie Booze, MD;  Location: [place] [facility];  Service: Cardiovascular;  Laterality: N/A;  . CARDIAC CATHETERIZATION N/A 12/31/2016   Procedure: Coronary Stent Intervention;  Surgeon: Jettie Booze, MD;  Location: [place] [facility];  Service: Cardiovascular;  Laterality: N/A;  . KYPHOPLASTY  reports that she has never smoked. She has never used smokeless tobacco. She reports that she does not drink alcohol or use drugs.  No Known Allergies  Family History  Problem Relation Age of Onset  . Diabetes Mother      Prior to Admission medications   Medication Sig Start Date End Date Taking? Authorizing Provider  aspirin 81 MG chewable tablet Chew 81 mg by mouth daily.   Yes  [provider]  carvedilol (COREG) 6.25 MG tablet Take 1 tablet (6.25 mg total) by mouth 2 (two) times daily with a meal. 01/02/17  Yes Darrick Meigs, Marge Duncans, MD  cephALEXin (KEFLEX) 500 MG capsule Take 1 capsule (500 mg total) by mouth 2 (two) times daily. 11/03/18  Yes Larene Pickett, PA-C  cholecalciferol (VITAMIN D3) 25 MCG (1000 UT) tablet Take 1,000 Units by mouth daily.   Yes [provider]  clopidogrel (PLAVIX) 75 MG tablet Take 1 tablet (75 mg total) by mouth daily. DO NOT START UNTIL FINISHED WITH BRILINTA 08/14/18 08/14/19 Yes Weaver, Scott T, PA-C  HYDROcodone-acetaminophen (NORCO/VICODIN) 5-325 MG tablet Take 1 tablet by mouth every 6 (six) hours as needed. Patient taking differently: Take 1 tablet by mouth 3 (three) times daily.  01/02/17  Yes Oswald Hillock, MD  iron polysaccharides (NIFEREX) 150 MG capsule Take 1 capsule (150 mg total) by mouth daily. 09/21/13  Yes Wenda Low, MD  losartan (COZAAR) 25 MG tablet Take 1 tablet (25 mg total) by mouth daily. 09/03/18  Yes Weaver, Scott T, PA-C  metFORMIN (GLUCOPHAGE) 500 MG tablet Take 500 mg by mouth daily with breakfast. with food 07/31/18  Yes [provider]  rosuvastatin (CRESTOR) 20 MG tablet Take 1 tablet (20 mg total) by mouth daily at 6 PM. 01/02/17  Yes Darrick Meigs, Marge Duncans, MD  acetaminophen (TYLENOL) 500 MG tablet Take 1,000 mg by mouth every 6 (six) hours as needed for mild pain.    [provider]  cholecalciferol 4000 units TABS Take 4,000 Units by mouth daily. Patient not taking: Reported on 11/12/2018 01/02/17   Oswald Hillock, MD    Physical Exam: Vitals:   11/12/18 1206 11/12/18 1207 11/12/18 1257 11/12/18 1330  BP:    (!) 164/85  Pulse:  (P) 98  94  Resp:    (!) 24  SpO2: 92%   93%  Weight:   73.2 kg   Height:   _0  (1.448 m)     Vitals:   11/12/18 1206 11/12/18 1207 11/12/18 1257 11/12/18 1330  BP:    (!) 164/85  Pulse:  (P) 98  94  Resp:    (!) 24  SpO2: 92%   93%  Weight:   73.2 kg    Height:   _1  (1.448 m)    General: deconditioned and ill looking appearing, in pain.  Neurology: somnolent, follows commands, answer to simple questions.  Head and Neck. Head normocephalic. Neck supple with no adenopathy or thyromegaly.   E ENT: mild pallor, no icterus, oral mucosa dry.  Cardiovascular: No JVD. S1-S2 present, rhythmic, no gallops, rubs, or murmurs. Trace non pitting lower extremity edema. Pulmonary: positive breath sounds bilaterally, adequate air movement, no wheezing, rhonchi or rales. Gastrointestinal. Abdomen protuberant with no organomegaly, non tender, no rebound or guarding Skin. No rashes Musculoskeletal: positive back pain when straight leg rising bilateral, ambulation not assessed.     Labs on Admission: I have personally reviewed following labs and imaging studies  CBC: Recent Labs  Lab  11/12/18 1230  WBC 13.1*  NEUTROABS 11.3*  HGB 15.7*  HCT 48.7*  MCV 82.5  PLT 591*   Basic Metabolic Panel: Recent Labs  Lab 11/12/18 1230  NA 147*  K 4.5  CL 99  CO2 32  GLUCOSE 585*  BUN 32*  CREATININE 0.90  CALCIUM 10.0   GFR: Estimated Creatinine Clearance: 42.6 mL/min (by C-G formula based on SCr of 0.9 mg/dL). Liver Function Tests: Recent Labs  Lab 11/12/18 1230  AST 16  ALT 16  ALKPHOS 82  BILITOT 0.9  PROT 9.3*  ALBUMIN 4.1   No results for input(s): LIPASE, AMYLASE in the last 168 hours. No results for input(s): AMMONIA in the last 168 hours. Coagulation Profile: Recent Labs  Lab 11/12/18 1230  INR 0.97   Cardiac Enzymes: Recent Labs  Lab 11/12/18 1230  CKTOTAL 34*   BNP (last 3 results) No results for input(s): PROBNP in the last 8760 hours. HbA1C: No results for input(s): HGBA1C in the last 72 hours. CBG: No results for input(s): GLUCAP in the last 168 hours. Lipid Profile: No results for input(s): CHOL, HDL, LDLCALC, TRIG, CHOLHDL, LDLDIRECT in the last 72 hours. Thyroid Function Tests: No results for input(s):  TSH, T4TOTAL, FREET4, T3FREE, THYROIDAB in the last 72 hours. Anemia Panel: No results for input(s): VITAMINB12, FOLATE, FERRITIN, TIBC, IRON, RETICCTPCT in the last 72 hours. Urine analysis:    Component Value Date/Time   COLORURINE RED (A) 11/02/2018 2359   APPEARANCEUR HAZY (A) 11/02/2018 2359   LABSPEC 1.023 11/02/2018 2359   PHURINE 6.0 11/02/2018 2359   GLUCOSEU >=500 (A) 11/02/2018 2359   HGBUR SMALL (A) 11/02/2018 2359   BILIRUBINUR NEGATIVE 11/02/2018 2359   KETONESUR 5 (A) 11/02/2018 2359   PROTEINUR >=300 (A) 11/02/2018 2359   UROBILINOGEN 0.2 09/20/2013 0822   NITRITE NEGATIVE 11/02/2018 2359   LEUKOCYTESUR TRACE (A) 11/02/2018 2359    Radiological Exams on Admission: Dg Chest 2 View  Result Date: 11/12/2018 CLINICAL DATA:  Labored breathing for several days. EXAM: CHEST - 2 VIEW COMPARISON:  Chest radiograph 07/22/2018 and priors. FINDINGS: The cardiac silhouette is enlarged. Calcific atherosclerotic disease and tortuosity of the aorta. There is no evidence of focal airspace consolidation, pleural effusion or pneumothorax. Mild pulmonary vascular congestion. Compression deformity of T7 vertebral body, new from the most recent lateral chest radiograph dated 12/28/2016. Soft tissues are grossly normal. IMPRESSION: Enlarged cardiac silhouette with calcific atherosclerotic disease of the aorta and mild pulmonary vascular congestion. New from January 2018 T7 compression fracture with approximately 50% height loss. Electronically Signed   By: Fidela Salisbury M.D.   On: 11/12/2018 13:14   Dg Chest Port 1 View  Result Date: 11/12/2018 CLINICAL DATA:  Dyspnea.  Fever and pain. EXAM: PORTABLE CHEST 1 VIEW COMPARISON:  11/12/2018 at 1245 hours FINDINGS: Portable semi upright view of the chest was obtained. Linear densities in the left lower chest are similar to the recent comparison examination could represent scarring or atelectasis. Slightly prominent lung markings appear chronic.  Old fracture of the right sixth rib. Heart size is within normal limits stable. Atherosclerotic calcifications at the aortic arch. Negative for a pneumothorax. IMPRESSION: Stable chest radiograph findings from the recent comparison examination. Linear densities in left lower chest are suggestive for subsegmental atelectasis versus scarring. Electronically Signed   By: Markus Daft M.D.   On: 11/12/2018 13:43    EKG: Independently reviewed.  Sinus rhythm, normal axis, normal intervals, positive PVCs.  Assessment/Plan Active Problems:   Hyperglycemia  77 year old female who presents with persistent lower back pain, severe in intensity, has affected her ability to eat and ambulate, complicated with poor oral intake and encephalopathy.  She has been treated for this condition as an outpatient, her symptoms have been persistent, she failed outpatient therapy.  On initial physical examination blood pressure 164/85, heart rate 94, respiratory rate 29, oxygen saturation 93%, dry mucous membranes, lungs clear to auscultation, heart S1-S2 present rhythmic, abdomen protuberant and nontender, trace lower extremity edema, positive back pain upon leg rising bilaterally.  Sodium 147, potassium 4.5, chloride 99, bicarb 32, glucose 585, BUN 32, creatinine 0.90, venous lactic acid 2.7, white count 13.1, hemoglobin 15.7, hematocrit 48.7, platelets 463.  Chest radiograph with bibasilar atelectasis.  Patient will be admitted to the hospital working diagnosis of severe back pain complicated by metabolic encephalopathy, hyperglycemia, hypernatremia and dehydration.  Failed outpatient therapy  1.  Severe back pain complicated by metabolic encephalopathy.  Patient had experiencing worsening of her back pain, affecting her p.o. intake and ambulation.  She failed outpatient therapy.  Will proceed with further imaging with spine MRI, continue pain control with IV morphine, neurochecks every 4 hours, and physical therapy.  Hydration  with IV fluids.  2.  Type 2 diabetes mellitus with uncontrolled hyperglycemia.  Will add insulin sliding scale for glucose coverage and monitoring, continue to calculate insulin requirements.  Patient at home on metformin, in the hospital will start her on long-acting insulin 10 units of insulin glargine.  Likely the dose will have to be adjusted over the next 48 hours.  3.  Coronary artery disease.  Chest pain-free, continue clopidogrel, aspirin and rosuvastatin.  4.  Hypertension.  Continue blood pressure control with carvedilol, and losartan.  DVT prophylaxis: enoxaprin  Code Status: full  Family Communication: I spoke with patient's family at the bedside and all questions were addressed.  Disposition Plan: med-surg  Consults called: none   Admission status: Inpatient.     Joaquina Nissen Gerome Apley MD [facility] Pager [phone]  If 7PM-7AM, please contact night-coverage [email or web address] Password [email or web address]  11/12/2018, 2:42 PM

## 2018-11-12 NOTE — ED Notes (Signed)
I gave critical I Stat CG4 results to MD Messick 

## 2018-11-13 ENCOUNTER — Other Ambulatory Visit: Payer: Self-pay

## 2018-11-13 DIAGNOSIS — S22050A Wedge compression fracture of T5-T6 vertebra, initial encounter for closed fracture: Secondary | ICD-10-CM

## 2018-11-13 DIAGNOSIS — I2510 Atherosclerotic heart disease of native coronary artery without angina pectoris: Secondary | ICD-10-CM

## 2018-11-13 DIAGNOSIS — M549 Dorsalgia, unspecified: Secondary | ICD-10-CM

## 2018-11-13 LAB — BASIC METABOLIC PANEL
Anion gap: 12 (ref 5–15)
BUN: 34 mg/dL — ABNORMAL HIGH (ref 8–23)
CO2: 28 mmol/L (ref 22–32)
Calcium: 8.7 mg/dL — ABNORMAL LOW (ref 8.9–10.3)
Chloride: 110 mmol/L (ref 98–111)
Creatinine, Ser: 0.65 mg/dL (ref 0.44–1.00)
GFR calc Af Amer: >60 mL/min (ref >60)
GFR calc non Af Amer: >60 mL/min (ref >60)
Glucose, Bld: 72 mg/dL (ref 70–99)
Potassium: 3.6 mmol/L (ref 3.5–5.1)
Sodium: 150 mmol/L — ABNORMAL HIGH (ref 135–145)

## 2018-11-13 LAB — CBC
HCT: 41.5 % (ref 36.0–46.0)
Hemoglobin: 13 g/dL (ref 12.0–15.0)
MCH: 25.8 pg — AB (ref 26.0–34.0)
MCHC: 31.3 g/dL (ref 30.0–36.0)
MCV: 82.5 fL (ref 80.0–100.0)
Platelets: 354 10*3/uL (ref 150–400)
RBC: 5.03 MIL/uL (ref 3.87–5.11)
RDW: 13.6 % (ref 11.5–15.5)
WBC: 12 10*3/uL — ABNORMAL HIGH (ref 4.0–10.5)
nRBC: 0 % (ref 0.0–0.2)

## 2018-11-13 LAB — URINALYSIS, ROUTINE W REFLEX MICROSCOPIC
BILIRUBIN URINE: NEGATIVE
Glucose, UA: >=500 mg/dL — AB
HGB URINE DIPSTICK: NEGATIVE
Ketones, ur: 5 mg/dL — AB
LEUKOCYTES UA: NEGATIVE
Nitrite: NEGATIVE
Protein, ur: 100 mg/dL — AB
SPECIFIC GRAVITY, URINE: 1.039 — AB (ref 1.005–1.030)
pH: 6 (ref 5.0–8.0)

## 2018-11-13 LAB — GLUCOSE, CAPILLARY
Glucose-Capillary: 442 mg/dL — ABNORMAL HIGH (ref 70–99)
Glucose-Capillary: >600 mg/dL (ref 70–99)
Glucose-Capillary: 151 mg/dL — ABNORMAL HIGH (ref 70–99)
Glucose-Capillary: 68 mg/dL — ABNORMAL LOW (ref 70–99)
Glucose-Capillary: 214 mg/dL — ABNORMAL HIGH (ref 70–99)
Glucose-Capillary: 263 mg/dL — ABNORMAL HIGH (ref 70–99)
Glucose-Capillary: 351 mg/dL — ABNORMAL HIGH (ref 70–99)

## 2018-11-13 MED ORDER — SENNOSIDES-DOCUSATE SODIUM 8.6-50 MG PO TABS
1.0000 | ORAL_TABLET | Freq: Two times a day (BID) | ORAL | Status: DC
  Administered 2018-11-13 – 2018-11-23 (×18): 1 via ORAL
  Filled 2018-11-13 (×21): qty 1

## 2018-11-13 MED ORDER — INSULIN ASPART 100 UNIT/ML ~~LOC~~ SOLN
5.0000 [IU] | Freq: Once | SUBCUTANEOUS | Status: AC
  Administered 2018-11-13: 5 [IU] via SUBCUTANEOUS

## 2018-11-13 MED ORDER — POLYETHYLENE GLYCOL 3350 17 G PO PACK
17.0000 g | PACK | Freq: Two times a day (BID) | ORAL | Status: DC
  Administered 2018-11-13 – 2018-11-23 (×11): 17 g via ORAL
  Filled 2018-11-13 (×21): qty 1

## 2018-11-13 MED ORDER — DEXTROSE 50 % IV SOLN
12.5000 g | INTRAVENOUS | Status: AC
  Administered 2018-11-13: 12.5 g via INTRAVENOUS

## 2018-11-13 MED ORDER — DEXTROSE 50 % IV SOLN
INTRAVENOUS | Status: AC
  Filled 2018-11-13: qty 50

## 2018-11-13 MED ORDER — BISACODYL 10 MG RE SUPP: 10.0000 mg | Freq: Every day | RECTAL | Status: DC | PRN

## 2018-11-13 MED ORDER — DEXTROSE-NACL 5-0.45 % IV SOLN
INTRAVENOUS | Status: DC
  Administered 2018-11-13 – 2018-11-14 (×2): via INTRAVENOUS

## 2018-11-13 MED ORDER — INSULIN ASPART 100 UNIT/ML ~~LOC~~ SOLN
15.0000 [IU] | Freq: Once | SUBCUTANEOUS | Status: AC
  Administered 2018-11-13: 15 [IU] via SUBCUTANEOUS

## 2018-11-13 NOTE — Progress Notes (Signed)
PROGRESS NOTE    Amy Hood  HQI:[id number] DOB: 18-Feb-1941 DOA: 11/12/2018 PCP: Georgann Housekeeper, MD   Brief Narrative:  HPI per Dr. Erin Hearing on 11/12/18 Amy Hood is a 77 y.o. female with medical history significant of chronic arthritis, coronary artery disease, atrial fibrillation, dyslipidemia, hypertension, and type 2 diabetes mellitus.  Patient presents with severe lower back pain, sharp in nature, severe in intensity, worse with movement, no improving factors, associated ambulatory dysfunction, poor appetite, and confusion.  Patient was seen November 25 in the emergency department for the same complaint, she was diagnosed with a urinary tract infection and was prescribed cephalexin.  She did complete a course of oral antibiotics at home with no improvement of her symptoms.   For the last 10 days patient has been in bedridden due to severe back pain, had difficulty seating that has affected her p.o. Intake. Patient usually ambulates with a walker.  She takes opiates for pain control.  All the information is obtained through her daughter due to patient's language barriers.   **She speaks [place] and her daughter translated while she is in the room.  Patient states that her back pain is improved a little bit and patient daughter thinks that her mentation is improved back to baseline.  I have called IR for possible vertebroplasty given her acute back pain  Assessment & Plan:   Active Problems:   Hyperglycemia  Severe back pain complicated by metabolic encephalopathy.   -Patient had experiencing worsening of her back pain, affecting her p.o. intake and ambulation.   -She failed outpatient therapy.   -Will proceed with further imaging with spine MRI -MRI of the Spine showed acute T6 compression fracture with up to 50% height loss in the form of meter bony retropulsion resulting mild to moderate spinal stenosis and no other acute abnormality within the thoracic spine and no  other significant stenosis.  There is no acute compression fracture abnormality in the lumbar spine but there is multiple chronic compression fractures with sequelae of prior vertebral augmentation of the L1, L2, L4 and L5 vertebral bodies.  There is also multilevel degenerative spondylosis with resultant moderate spinal stenosis at L1-L2, L3-L4, and L4-L5.  There is also moderate right L3, bilateral L4 and left L5 foraminal narrowing that the bulging disc and facet hypertrophy -Will consider discussing with neurosurgery about lumbar spine changes -I have consulted for possible vertebroplasty and we have held her Plavix -Continue pain control with IV morphine, neurochecks every 4 hours -Physical therapy to evaluate and Treat.   -Hydration with IV fluids with NS changed to D5 1/2 NS at 75 mL/hr   Type 2 diabetes mellitus with uncontrolled hyperglycemia -Added insulin sliding scale for glucose coverage and monitoring, continue to calculate insulin requirements. -Patient at home on metformin -in the hospital will start her on long-acting insulin 10 units of insulin glargine.  -CBG's ranging from [phone] -Diabetes Education Coordinator to be consulted  Coronary artery disease s/p Stenting -Chest pain-free; C/w aspirin and rosuvastatin. -Hold Clopidogrel for possible Intervention by IR for Vertebroplasty/Kyphoplasty  Hypertension -BP was 148/82 -Continue blood pressure control with carvedilol, and losartan.  Hypernatremia -May have been factoring into her Encephalopathy -Continue to Monitor and if continues to worsen will place on D5 gtt; IVF currently changed to D5W 1/2 NS  Leukocytosis -Likely reactive in the setting of pain -Improving -Continue monitor for signs and symptoms of infection -Repeat CBC in a.m.  Constipation -Bowel regimen started with senna docusate 1 tab p.o. twice daily,  MiraLAX 17 g p.o. twice daily, and bisacodyl suppository  Obesity -Estimated body mass index is  34.92 kg/m as calculated from the following:   Height as of this encounter: 4\' 9"  (1.448 m).   Weight as of this encounter: 73.2 kg. -Weight Loss Counseling Advised  DVT prophylaxis: Enoxaparin 40 mg sq q24h Code Status: FULL CODE Family Communication: Discussed with Family present at bedside Disposition Plan: Continue to Treat Pain and have IR evaluate for possible Vertebroplasty  Consultants:   IR  Procedures:  MRI   Antimicrobials:  Anti-infectives (From admission, onward)   Start     Dose/Rate Route Frequency Ordered Stop   11/12/18 1330  cefTRIAXone (ROCEPHIN) 1 g in sodium chloride 0.9 % 100 mL IVPB     1 g 200 mL/hr over 30 Minutes Intravenous  Once 11/12/18 1317 11/12/18 1525     Subjective: Seen and examined at bedside and states that her pain was slightly better than yesterday and controlled with IV pain medication.  No nausea or vomiting.  Family at bedside translated and she is doing okay.  No other complaints at this time except that she has not had a bowel movement of a while.  Objective: Vitals:   11/12/18 1618 11/12/18 2229 11/13/18 0617 11/13/18 1421  BP:  (!) 116/57 (!) 152/75 (!) 148/82  Pulse:  79 83 69  Resp:  15 20 16   Temp:  (!) 97.5 F (36.4 C) 97.8 F (36.6 C) 98.1 F (36.7 C)  TempSrc:  Oral Oral Oral  SpO2: 95% 95% 97% 95%  Weight:      Height:        Intake/Output Summary (Last 24 hours) at 11/13/2018 1735 Last data filed at 11/13/2018 1500 Gross per 24 hour  Intake 1520.28 ml  Output 200 ml  Net 1320.28 ml   Filed Weights   11/12/18 1257  Weight: 73.2 kg   Examination: Physical Exam:  Constitutional: WN/WD obese [place] female in NAD and appears calm  Eyes: Lids and conjunctivae normal, sclerae anicteric  ENMT: External Ears, Nose appear normal. Grossly normal hearing.  Neck: Appears normal, supple, no cervical masses, normal ROM, no appreciable thyromegaly; no JVD Respiratory: Diminished to auscultation bilaterally, no  wheezing, rales, rhonchi or crackles. Normal respiratory effort and patient is not tachypenic. No accessory muscle use.  Cardiovascular: RRR, no murmurs / rubs / gallops. S1 and S2 auscultated. Abdomen: Soft, non-tender, Distended slightly 2/2 body habitus. No masses palpated. No appreciable hepatosplenomegaly. Bowel sounds positive x4.  GU: Deferred. Musculoskeletal: No clubbing / cyanosis of digits/nails.  Skin: No rashes, lesions, ulcer on a limited skin evalu.. No induration; Warm and dry.  Neurologic: CN 2-12 grossly intact with no focal deficits. Romberg sign and cerebellar reflexes not assessed.  Psychiatric: Normal judgment and insight. Alert and oriented x 3. Normal mood and appropriate affect.   Data Reviewed: I have personally reviewed following labs and imaging studies  CBC: Recent Labs  Lab 11/12/18 1230 11/13/18 0435  WBC 13.1* 12.0*  NEUTROABS 11.3*  --   HGB 15.7* 13.0  HCT 48.7* 41.5  MCV 82.5 82.5  PLT 463* 354   Basic Metabolic Panel: Recent Labs  Lab 11/12/18 1230 11/12/18 2308 11/13/18 0435  NA 147*  --  150*  K 4.5  --  3.6  CL 99  --  110  CO2 32  --  28  GLUCOSE 585* 675* 72  BUN 32*  --  34*  CREATININE 0.90  --  0.65  CALCIUM  10.0  --  8.7*   GFR: Estimated Creatinine Clearance: 47.9 mL/min (by C-G formula based on SCr of 0.65 mg/dL). Liver Function Tests: Recent Labs  Lab 11/12/18 1230  AST 16  ALT 16  ALKPHOS 82  BILITOT 0.9  PROT 9.3*  ALBUMIN 4.1   No results for input(s): LIPASE, AMYLASE in the last 168 hours. No results for input(s): AMMONIA in the last 168 hours. Coagulation Profile: Recent Labs  Lab 11/12/18 1230  INR 0.97   Cardiac Enzymes: Recent Labs  Lab 11/12/18 1230  CKTOTAL 34*   BNP (last 3 results) No results for input(s): PROBNP in the last 8760 hours. HbA1C: No results for input(s): HGBA1C in the last 72 hours. CBG: Recent Labs  Lab 11/13/18 0057 11/13/18 0742 11/13/18 0815 11/13/18 1118  11/13/18 1647  GLUCAP 442* 68* 151* 214* 263*   Lipid Profile: No results for input(s): CHOL, HDL, LDLCALC, TRIG, CHOLHDL, LDLDIRECT in the last 72 hours. Thyroid Function Tests: No results for input(s): TSH, T4TOTAL, FREET4, T3FREE, THYROIDAB in the last 72 hours. Anemia Panel: No results for input(s): VITAMINB12, FOLATE, FERRITIN, TIBC, IRON, RETICCTPCT in the last 72 hours. Sepsis Labs: Recent Labs  Lab 11/12/18 1235 11/12/18 1358  LATICACIDVEN 2.75* 2.51*    Recent Results (from the past 240 hour(s))  Culture, blood (Routine x 2)     Status: None (Preliminary result)   Collection Time: 11/12/18 12:18 PM  Result Value Ref Range Status   Specimen Description   Final    BLOOD LEFT ANTECUBITAL Performed at [facility], [address], [place], [place] [address]    Special Requests   Final    BOTTLES DRAWN AEROBIC AND ANAEROBIC Blood Culture adequate volume   Culture   Final    NO GROWTH 1 DAY Performed at [facility], [address], [place], [place] [address]    Report Status PENDING  Incomplete    RN Pressure Injury Documentation: Pressure Injury 12/31/16 Stage I -  Intact skin with non-blanchable redness of a localized area usually over a bony prominence. baseball sized area of redness (Active)  12/31/16 1702   Location: Sacrum  Location Orientation: Mid  Staging: Stage I -  Intact skin with non-blanchable redness of a localized area usually over a bony prominence.  Wound Description (Comments): baseball sized area of redness  Present on Admission: Yes   Radiology Studies: Dg Chest 2 View  Result Date: 11/12/2018 CLINICAL DATA:  Labored breathing for several days. EXAM: CHEST - 2 VIEW COMPARISON:  Chest radiograph 07/22/2018 and priors. FINDINGS: The cardiac silhouette is enlarged. Calcific atherosclerotic disease and tortuosity of the aorta. There is no evidence of focal airspace consolidation, pleural effusion or pneumothorax. Mild  pulmonary vascular congestion. Compression deformity of T7 vertebral body, new from the most recent lateral chest radiograph dated 12/28/2016. Soft tissues are grossly normal. IMPRESSION: Enlarged cardiac silhouette with calcific atherosclerotic disease of the aorta and mild pulmonary vascular congestion. New from January 2018 T7 compression fracture with approximately 50% height loss. Electronically Signed   By: Ted Mcalpine M.D.   On: 11/12/2018 13:14   Mr Thoracic Spine Wo Contrast  Result Date: 11/12/2018 CLINICAL DATA:  Initial evaluation for mid and lower back pain. Evaluate for possible compression fractures. EXAM: MRI THORACIC AND LUMBAR SPINE WITHOUT CONTRAST TECHNIQUE: Multiplanar and multiecho pulse sequences of the thoracic and lumbar spine were obtained without intravenous contrast. COMPARISON:  None available. FINDINGS: MRI THORACIC SPINE FINDINGS Alignment: Mild roto dextroscoliosis  of the thoracic spine. Alignment otherwise normal with preservation of the normal thoracic kyphosis. No listhesis. Vertebrae: There is an acute compression fracture involving the T6 vertebral body. Associated height loss of up to 50% with 4 mm bony retropulsion. This is benign/mechanical in appearance. The fracture appears largely confined to the T6 vertebral body itself, although reactive marrow edema extends into the posterior elements bilaterally. Vertebral body heights otherwise maintained without evidence for acute or chronic fracture. Chronic compression deformities with sequelae of prior vertebral augmentation noted at L1 and L2. Underlying bone marrow signal intensity within normal limits. No discrete or worrisome osseous lesions. No other abnormal marrow edema. Cord: Signal intensity within the thoracic spinal cord is normal. Normal cord caliber and morphology. Conus medullaris terminates at approximately the L2 level. Paraspinal and other soft tissues: Mild paraspinous edema adjacent to the T6  compression fracture. Paraspinous soft tissues demonstrate no other acute finding. Atherosclerotic change noted within the aorta. Scattered T2 hyperintense renal cysts noted bilaterally, largest of which seen on the right and measures 19 mm in diameter. Remainder of the visualized visceral structures otherwise unremarkable. Visualized lungs are grossly clear. Disc levels: T1-2:  Unremarkable. T2-3: Unremarkable. T3-4:  Unremarkable. T4-5:  Unremarkable. T5-6: 4 mm bony retropulsion related to the acute T6 compression fracture. Ventral CSF largely effaced by retropulsed bone. Superimposed mild facet hypertrophy. Dorsal epidural lipomatosis. Resultant mild to moderate spinal stenosis. Foramina remain patent. T6-7:  Epidural lipomatosis with resultant mild spinal stenosis. T7-8: Mild dorsal epidural lipomatosis. No significant spinal stenosis. T8-9:  Unremarkable. T9-10: Mild bilateral facet hypertrophy.  No stenosis. T10-11:  Mild facet hypertrophy.  No stenosis. T11-12:  Mild facet hypertrophy.  No stenosis. MRI LUMBAR SPINE FINDINGS Segmentation: There appear to be 5 lumbar type vertebral bodies. However, when counting from the dens superiorly there only appear to be 11 type thoracic vertebral bodies. Lowest well-formed disc space will be labeled L5-S1. Alignment: Mild levoscoliosis. Alignment otherwise normal with preservation of the normal lumbar lordosis. No listhesis. Vertebrae: Chronic compression deformity involving the L1 vertebral body with sequelae of prior vertebral augmentation. Associated height loss of up to 50-60 % with 5 mm bony retropulsion. Additional chronic compression deformity involving the L2 vertebral body with sequelae of vertebral augmentation. Associated mild approximate 25% height loss without bony retropulsion. Chronic compression deformity at L4 with up to 40-50% height loss without bony retropulsion. Additional chronic compression deformity at L5 with sequelae of prior vertebral  augmentation. Height loss measures up to approximately 40% without bony retropulsion. No acute compression fracture within the lumbar spine. Underlying bone marrow signal intensity heterogeneous without discrete or worrisome osseous lesions. No abnormal marrow edema. Conus medullaris and cauda equina: Conus extends to the L2 level. Conus and cauda equina appear normal. Paraspinal and other soft tissues: Paraspinous soft tissues demonstrate no acute finding. Chronic fatty atrophy noted within the posterior paraspinous and psoas musculature bilaterally. Atherosclerotic change noted within the aorta. Bilateral renal cyst noted. Disc levels: L1-2: 5 mm bony retropulsion related to the chronic L1 compression fracture. Retropulsed bone indents the ventral thecal sac. Mild facet hypertrophy. Resultant mild to moderate spinal stenosis. Mild bilateral L1 foraminal narrowing. L2-3: Mild disc bulge with disc desiccation. Mild bilateral facet and ligament flavum hypertrophy. No significant spinal stenosis. Mild bilateral L2 foraminal narrowing, left greater than right. L3-4: Diffuse disc bulge with disc desiccation. Disc bulging asymmetric to the right with associated right far lateral reactive endplate changes with marginal endplate osteophytic spurring. Mild to moderate facet and ligament  flavum hypertrophy. Epidural lipomatosis. Resultant moderate spinal stenosis. Moderate right with mild left L3 foraminal narrowing. L4-5: Mild diffuse disc bulge with disc desiccation. Disc bulging asymmetric to the right with superimposed central and right foraminal annular fissures. Moderate facet and ligament flavum hypertrophy. Epidural lipomatosis. Resultant moderate spinal stenosis. Moderate bilateral L4 foraminal narrowing, right worse than left. L5-S1: Mild diffuse disc bulge with disc desiccation. Epidural lipomatosis. Mild to moderate facet hypertrophy. Compression of the distal thecal sac by the epidural fat. Mild right with  moderate left L5 foraminal narrowing. IMPRESSION: MR THORACIC SPINE IMPRESSION 1. Acute T6 compression fracture with up to 50% height loss and 4 mm bony retropulsion with resultant mild to moderate spinal stenosis. 2. No other acute abnormality within the thoracic spine. No other significant stenosis. 3. Mild facet hypertrophy within the lower thoracic spine. MR LUMBAR SPINE IMPRESSION 1. No acute compression fracture or other abnormality within the lumbar spine. 2. Multiple chronic compression fractures with sequelae of prior vertebral augmentation involving the L1, L2, L4, and L5 vertebral bodies as above. 3. Multilevel degenerative spondylolysis with resultant moderate spinal stenosis at L1-2, L3-4 and L4-5. 4. Moderate right L3, bilateral L4, and left L5 foraminal narrowing related to disc bulging and facet hypertrophy. Electronically Signed   By: Rise Mu M.D.   On: 11/12/2018 22:47   Mr Lumbar Spine Wo Contrast  Result Date: 11/12/2018 CLINICAL DATA:  Initial evaluation for mid and lower back pain. Evaluate for possible compression fractures. EXAM: MRI THORACIC AND LUMBAR SPINE WITHOUT CONTRAST TECHNIQUE: Multiplanar and multiecho pulse sequences of the thoracic and lumbar spine were obtained without intravenous contrast. COMPARISON:  None available. FINDINGS: MRI THORACIC SPINE FINDINGS Alignment: Mild roto dextroscoliosis of the thoracic spine. Alignment otherwise normal with preservation of the normal thoracic kyphosis. No listhesis. Vertebrae: There is an acute compression fracture involving the T6 vertebral body. Associated height loss of up to 50% with 4 mm bony retropulsion. This is benign/mechanical in appearance. The fracture appears largely confined to the T6 vertebral body itself, although reactive marrow edema extends into the posterior elements bilaterally. Vertebral body heights otherwise maintained without evidence for acute or chronic fracture. Chronic compression deformities  with sequelae of prior vertebral augmentation noted at L1 and L2. Underlying bone marrow signal intensity within normal limits. No discrete or worrisome osseous lesions. No other abnormal marrow edema. Cord: Signal intensity within the thoracic spinal cord is normal. Normal cord caliber and morphology. Conus medullaris terminates at approximately the L2 level. Paraspinal and other soft tissues: Mild paraspinous edema adjacent to the T6 compression fracture. Paraspinous soft tissues demonstrate no other acute finding. Atherosclerotic change noted within the aorta. Scattered T2 hyperintense renal cysts noted bilaterally, largest of which seen on the right and measures 19 mm in diameter. Remainder of the visualized visceral structures otherwise unremarkable. Visualized lungs are grossly clear. Disc levels: T1-2:  Unremarkable. T2-3: Unremarkable. T3-4:  Unremarkable. T4-5:  Unremarkable. T5-6: 4 mm bony retropulsion related to the acute T6 compression fracture. Ventral CSF largely effaced by retropulsed bone. Superimposed mild facet hypertrophy. Dorsal epidural lipomatosis. Resultant mild to moderate spinal stenosis. Foramina remain patent. T6-7:  Epidural lipomatosis with resultant mild spinal stenosis. T7-8: Mild dorsal epidural lipomatosis. No significant spinal stenosis. T8-9:  Unremarkable. T9-10: Mild bilateral facet hypertrophy.  No stenosis. T10-11:  Mild facet hypertrophy.  No stenosis. T11-12:  Mild facet hypertrophy.  No stenosis. MRI LUMBAR SPINE FINDINGS Segmentation: There appear to be 5 lumbar type vertebral bodies. However, when counting from the  dens superiorly there only appear to be 11 type thoracic vertebral bodies. Lowest well-formed disc space will be labeled L5-S1. Alignment: Mild levoscoliosis. Alignment otherwise normal with preservation of the normal lumbar lordosis. No listhesis. Vertebrae: Chronic compression deformity involving the L1 vertebral body with sequelae of prior vertebral  augmentation. Associated height loss of up to 50-60 % with 5 mm bony retropulsion. Additional chronic compression deformity involving the L2 vertebral body with sequelae of vertebral augmentation. Associated mild approximate 25% height loss without bony retropulsion. Chronic compression deformity at L4 with up to 40-50% height loss without bony retropulsion. Additional chronic compression deformity at L5 with sequelae of prior vertebral augmentation. Height loss measures up to approximately 40% without bony retropulsion. No acute compression fracture within the lumbar spine. Underlying bone marrow signal intensity heterogeneous without discrete or worrisome osseous lesions. No abnormal marrow edema. Conus medullaris and cauda equina: Conus extends to the L2 level. Conus and cauda equina appear normal. Paraspinal and other soft tissues: Paraspinous soft tissues demonstrate no acute finding. Chronic fatty atrophy noted within the posterior paraspinous and psoas musculature bilaterally. Atherosclerotic change noted within the aorta. Bilateral renal cyst noted. Disc levels: L1-2: 5 mm bony retropulsion related to the chronic L1 compression fracture. Retropulsed bone indents the ventral thecal sac. Mild facet hypertrophy. Resultant mild to moderate spinal stenosis. Mild bilateral L1 foraminal narrowing. L2-3: Mild disc bulge with disc desiccation. Mild bilateral facet and ligament flavum hypertrophy. No significant spinal stenosis. Mild bilateral L2 foraminal narrowing, left greater than right. L3-4: Diffuse disc bulge with disc desiccation. Disc bulging asymmetric to the right with associated right far lateral reactive endplate changes with marginal endplate osteophytic spurring. Mild to moderate facet and ligament flavum hypertrophy. Epidural lipomatosis. Resultant moderate spinal stenosis. Moderate right with mild left L3 foraminal narrowing. L4-5: Mild diffuse disc bulge with disc desiccation. Disc bulging asymmetric  to the right with superimposed central and right foraminal annular fissures. Moderate facet and ligament flavum hypertrophy. Epidural lipomatosis. Resultant moderate spinal stenosis. Moderate bilateral L4 foraminal narrowing, right worse than left. L5-S1: Mild diffuse disc bulge with disc desiccation. Epidural lipomatosis. Mild to moderate facet hypertrophy. Compression of the distal thecal sac by the epidural fat. Mild right with moderate left L5 foraminal narrowing. IMPRESSION: MR THORACIC SPINE IMPRESSION 1. Acute T6 compression fracture with up to 50% height loss and 4 mm bony retropulsion with resultant mild to moderate spinal stenosis. 2. No other acute abnormality within the thoracic spine. No other significant stenosis. 3. Mild facet hypertrophy within the lower thoracic spine. MR LUMBAR SPINE IMPRESSION 1. No acute compression fracture or other abnormality within the lumbar spine. 2. Multiple chronic compression fractures with sequelae of prior vertebral augmentation involving the L1, L2, L4, and L5 vertebral bodies as above. 3. Multilevel degenerative spondylolysis with resultant moderate spinal stenosis at L1-2, L3-4 and L4-5. 4. Moderate right L3, bilateral L4, and left L5 foraminal narrowing related to disc bulging and facet hypertrophy. Electronically Signed   By: Rise Mu M.D.   On: 11/12/2018 22:47   Dg Chest Port 1 View  Result Date: 11/12/2018 CLINICAL DATA:  Dyspnea.  Fever and pain. EXAM: PORTABLE CHEST 1 VIEW COMPARISON:  11/12/2018 at 1245 hours FINDINGS: Portable semi upright view of the chest was obtained. Linear densities in the left lower chest are similar to the recent comparison examination could represent scarring or atelectasis. Slightly prominent lung markings appear chronic. Old fracture of the right sixth rib. Heart size is within normal limits stable. Atherosclerotic calcifications  at the aortic arch. Negative for a pneumothorax. IMPRESSION: Stable chest radiograph  findings from the recent comparison examination. Linear densities in left lower chest are suggestive for subsegmental atelectasis versus scarring. Electronically Signed   By: Richarda OverlieAdam  Henn M.D.   On: 11/12/2018 13:43   Scheduled Meds: . aspirin  81 mg Oral Daily  . carvedilol  6.25 mg Oral BID WC  . cholecalciferol  1,000 Units Oral Daily  . dextrose      . enoxaparin (LOVENOX) injection  40 mg Subcutaneous Q24H  . insulin aspart  0-9 Units Subcutaneous TID WC  . insulin glargine  10 Units Subcutaneous Daily  . iron polysaccharides  150 mg Oral Daily  . polyethylene glycol  17 g Oral BID  . rosuvastatin  20 mg Oral q1800  . senna-docusate  1 tablet Oral BID   Continuous Infusions: . sodium chloride Stopped (11/12/18 1524)  . sodium chloride 75 mL/hr at 11/13/18 1500    LOS: 1 day   Merlene Laughtermair Latif , DO [facility] PAGER is on AMION  If 7PM-7AM, please contact night-coverage [email or web address] Password [email or web address] 11/13/2018, 5:35 PM

## 2018-11-13 NOTE — Progress Notes (Signed)
Inpatient Diabetes Program Recommendations  AACE/ADA: New Consensus Statement on Inpatient Glycemic Control (2015)  Target Ranges:  Prepandial:   less than 140 mg/dL      Peak postprandial:   less than 180 mg/dL (1-2 hours)      Critically ill patients:  140 - 180 mg/dL   Results for Amy Hood, Amy Hood (MRN [id number]) as of 11/13/2018 09:23  Ref. Range 11/12/2018 22:32 11/13/2018 00:57 11/13/2018 07:42 11/13/2018 08:15  Glucose-Capillary Latest Ref Range: 70 - 99 mg/dL >409>600 (HH)  25 units NOVOLOG +  10 units LANTUS given at 6:20pm 442 (H)  15 units NOVOLOG given at 2:38am 68 (L) 151 (H)    Admit with: Severe Back Pain/ AMS/ Hyperglycemia  History: DM  Home DM Meds: Metformin 500 mg Daily  Current Orders: Lantus 10 units Daily      Novolog Sensitive Correction Scale/ SSI (0-9 units) TID AC      Note patient admitted with Severe Hyperglycemia.  Insulin started in the ED and CBGs have improved.    MD- Please consider placing orders for Hemoglobin A1c level to assess home glucose control  Last A1c on file was 6.3% back on 12/28/2016     --Will follow patient during hospitalization--  Ambrose FinlandJeannine Johnston Nyajah Hyson RN, MSN, CDE Diabetes Coordinator Inpatient Glycemic Control Team Team Pager: [phone] (8a-5p)

## 2018-11-13 NOTE — Progress Notes (Signed)
Chief Complaint: Patient was seen in consultation today for back pain at the request of Dr. Alfredia Ferguson  Referring Physician(s): Dr. Alfredia Ferguson   Supervising Physician: Daryll Brod  Patient Status: [facility] [place] - In-pt  History of Present Illness: Amy Hood is a 77 y.o. female admitted with worsening back pain and encephalopathy.  Pt does not speak any English and her family is not at bedside. I attempted to call granddaughter, but no answer.  Per chart, pt with chronic back pain, mostly lower, but has recently been complaining of worse back pain and has not wanted to get out of bed. Pt admitted and workup finds evidence of T6 fracture as well chronic changes in the lumbar spine as well. She has had prior vertebral augmentation of several lumbar fractures in the past.  IR is asked to eval for possible VP of this T6 fracture.  PMHx, meds, labs, imaging, allergies reviewed. Pt on daily Plavix for hx of CAD. This has been held however pt received dose today.  Really unable to get any type of history or exam of pt due to language barrier and could not reach family.    Past Medical History:  Diagnosis Date  . Anemia   . Arthritis   . CAD (coronary artery disease) 12/28/2016   S/p NSTEMI 1/18:  LHC - oLAD, mLAD 80, pLCx 75, mRCA 25 >> PCI:  3 x 24 mm Synergy DES to mid LAD; 3.5 x 28 mm Synergy DES to proximal LCx // Echo 1/18:  Mild LVH, EF 55-60, apical inf and apical HK, Gr 1 DD, AV mean 13, MAC, trivial MR, reduced RVSF, trivial TR, PASP 35, no eff  . Cataracts, bilateral   . Essential hypertension   . Generalized weakness   . Hard of hearing   . History of atrial fibrillation    post MI - likely related to acute medical illness therefore anticoag not started  . Hyperlipidemia   . Hyponatremia   . Mild aortic stenosis 12/30/2016   Echo 1/18:  Mild LVH, EF 55-60, apical inf and apical HK, Gr 1 DD, AV mean 13, MAC, trivial MR, reduced RVSF, trivial TR, PASP 35, no eff // Echo 09/02/2018:   Severe focal basal septal hypertrophy, EF 60-65, no RWMA, Gr 2 DD, very mild AS (mean 11), mild LAE   . Type 2 diabetes mellitus ([place])     Past Surgical History:  Procedure Laterality Date  . CARDIAC CATHETERIZATION N/A 12/31/2016   Procedure: Left Heart Cath and Coronary Angiography;  Surgeon: Jettie Booze, MD;  Location: [facility];  Service: Cardiovascular;  Laterality: N/A;  . CARDIAC CATHETERIZATION N/A 12/31/2016   Procedure: Coronary Stent Intervention;  Surgeon: Jettie Booze, MD;  Location: [place];  Service: Cardiovascular;  Laterality: N/A;  . KYPHOPLASTY      Allergies: Patient has no known allergies.  Medications:  Current Facility-Administered Medications:  .  0.9 %  sodium chloride infusion, , Intravenous, PRN, Valarie Merino, MD, Stopped at 11/12/18 1524 .  0.9 %  sodium chloride infusion, , Intravenous, Continuous, Arrien, Jimmy Picket, MD, Last Rate: 75 mL/hr at 11/13/18 1000 .  acetaminophen (TYLENOL) tablet 650 mg, 650 mg, Oral, Q6H PRN **OR** acetaminophen (TYLENOL) suppository 650 mg, 650 mg, Rectal, Q6H PRN, Arrien, Jimmy Picket, MD .  aspirin chewable tablet 81 mg, 81 mg, Oral, Daily, Arrien, Jimmy Picket, MD, 81 mg at 11/13/18 1014 .  bisacodyl (DULCOLAX) suppository 10 mg, 10 mg, Rectal, Daily PRN,  Sheikh, Omair Latif, DO .  carvedilol (COREG) tablet 6.25 mg, 6.25 mg, Oral, BID WC, Arrien, Jimmy Picket, MD, 6.25 mg at 11/13/18 1014 .  cholecalciferol (VITAMIN D) tablet 1,000 Units, 1,000 Units, Oral, Daily, Arrien, Jimmy Picket, MD, 1,000 Units at 11/13/18 1014 .  dextrose 50 % solution, , , ,  .  enoxaparin (LOVENOX) injection 40 mg, 40 mg, Subcutaneous, Q24H, Arrien, Jimmy Picket, MD, 40 mg at 11/12/18 2226 .  HYDROcodone-acetaminophen (NORCO/VICODIN) 5-325 MG per tablet 1 tablet, 1 tablet, Oral, Q6H PRN, Arrien, Jimmy Picket, MD, 1 tablet at 11/12/18 1636 .  insulin aspart (novoLOG) injection 0-9 Units,  0-9 Units, Subcutaneous, TID WC, Arrien, Jimmy Picket, MD, 3 Units at 11/13/18 1203 .  insulin glargine (LANTUS) injection 10 Units, 10 Units, Subcutaneous, Daily, Arrien, Jimmy Picket, MD, 10 Units at 11/13/18 1014 .  iron polysaccharides (NIFEREX) capsule 150 mg, 150 mg, Oral, Daily, Arrien, Jimmy Picket, MD, 150 mg at 11/13/18 1014 .  morphine 2 MG/ML injection 2 mg, 2 mg, Intravenous, Q2H PRN, Arrien, Jimmy Picket, MD, 2 mg at 11/13/18 0543 .  ondansetron (ZOFRAN) tablet 4 mg, 4 mg, Oral, Q6H PRN **OR** ondansetron (ZOFRAN) injection 4 mg, 4 mg, Intravenous, Q6H PRN, Arrien, Jimmy Picket, MD .  polyethylene glycol (MIRALAX / GLYCOLAX) packet 17 g, 17 g, Oral, BID, Raiford Noble [place], DO, 17 g at 11/13/18 1203 .  rosuvastatin (CRESTOR) tablet 20 mg, 20 mg, Oral, q1800, Arrien, Jimmy Picket, MD, 20 mg at 11/12/18 1822 .  senna-docusate (Senokot-S) tablet 1 tablet, 1 tablet, Oral, BID, Raiford Noble [place], [place], 1 tablet at 11/13/18 1203    Family History  Problem Relation Age of Onset  . Diabetes Mother     Social History   Socioeconomic History  . Marital status: Married    Spouse name: Not on file  . Number of children: Not on file  . Years of education: Not on file  . Highest education level: Not on file  Occupational History  . Not on file  Social Needs  . Financial resource strain: Not on file  . Food insecurity:    Worry: Not on file    Inability: Not on file  . Transportation needs:    Medical: Not on file    Non-medical: Not on file  Tobacco Use  . Smoking status: Never Smoker  . Smokeless tobacco: Never Used  Substance and Sexual Activity  . Alcohol use: No  . Drug use: No  . Sexual activity: Never  Lifestyle  . Physical activity:    Days per week: Not on file    Minutes per session: Not on file  . Stress: Not on file  Relationships  . Social connections:    Talks on phone: Not on file    Gets together: Not on file    Attends religious  service: Not on file    Active member of club or organization: Not on file    Attends meetings of clubs or organizations: Not on file    Relationship status: Not on file  Other Topics Concern  . Not on file  Social History Narrative  . Not on file    Review of Systems: A 12 point ROS discussed and pertinent positives are indicated in the HPI above.  All other systems are negative.  Review of Systems  Vital Signs: BP (!) 148/82 (BP Location: Right Arm)   Pulse 69   Temp 98.1 F (36.7 C) (Oral)   Resp 16  Ht _0  (1.448 m)   Wt 73.2 kg   SpO2 95%   BMI 34.92 kg/m   Physical Exam Pt resting in bed. Did not appear to be in any pain/distress.   Imaging: Dg Chest 2 View  Result Date: 11/12/2018 CLINICAL DATA:  Labored breathing for several days. EXAM: CHEST - 2 VIEW COMPARISON:  Chest radiograph 07/22/2018 and priors. FINDINGS: The cardiac silhouette is enlarged. Calcific atherosclerotic disease and tortuosity of the aorta. There is no evidence of focal airspace consolidation, pleural effusion or pneumothorax. Mild pulmonary vascular congestion. Compression deformity of T7 vertebral body, new from the most recent lateral chest radiograph dated 12/28/2016. Soft tissues are grossly normal. IMPRESSION: Enlarged cardiac silhouette with calcific atherosclerotic disease of the aorta and mild pulmonary vascular congestion. New from January 2018 T7 compression fracture with approximately 50% height loss. Electronically Signed   By: Fidela Salisbury M.D.   On: 11/12/2018 13:14   Mr Thoracic Spine Wo Contrast  Result Date: 11/12/2018 CLINICAL DATA:  Initial evaluation for mid and lower back pain. Evaluate for possible compression fractures. EXAM: MRI THORACIC AND LUMBAR SPINE WITHOUT CONTRAST TECHNIQUE: Multiplanar and multiecho pulse sequences of the thoracic and lumbar spine were obtained without intravenous contrast. COMPARISON:  None available. FINDINGS: MRI THORACIC SPINE FINDINGS  Alignment: Mild roto dextroscoliosis of the thoracic spine. Alignment otherwise normal with preservation of the normal thoracic kyphosis. No listhesis. Vertebrae: There is an acute compression fracture involving the T6 vertebral body. Associated height loss of up to 50% with 4 mm bony retropulsion. This is benign/mechanical in appearance. The fracture appears largely confined to the T6 vertebral body itself, although reactive marrow edema extends into the posterior elements bilaterally. Vertebral body heights otherwise maintained without evidence for acute or chronic fracture. Chronic compression deformities with sequelae of prior vertebral augmentation noted at L1 and L2. Underlying bone marrow signal intensity within normal limits. No discrete or worrisome osseous lesions. No other abnormal marrow edema. Cord: Signal intensity within the thoracic spinal cord is normal. Normal cord caliber and morphology. Conus medullaris terminates at approximately the L2 level. Paraspinal and other soft tissues: Mild paraspinous edema adjacent to the T6 compression fracture. Paraspinous soft tissues demonstrate no other acute finding. Atherosclerotic change noted within the aorta. Scattered T2 hyperintense renal cysts noted bilaterally, largest of which seen on the right and measures 19 mm in diameter. Remainder of the visualized visceral structures otherwise unremarkable. Visualized lungs are grossly clear. Disc levels: T1-2:  Unremarkable. T2-3: Unremarkable. T3-4:  Unremarkable. T4-5:  Unremarkable. T5-6: 4 mm bony retropulsion related to the acute T6 compression fracture. Ventral CSF largely effaced by retropulsed bone. Superimposed mild facet hypertrophy. Dorsal epidural lipomatosis. Resultant mild to moderate spinal stenosis. Foramina remain patent. T6-7:  Epidural lipomatosis with resultant mild spinal stenosis. T7-8: Mild dorsal epidural lipomatosis. No significant spinal stenosis. T8-9:  Unremarkable. T9-10: Mild  bilateral facet hypertrophy.  No stenosis. T10-11:  Mild facet hypertrophy.  No stenosis. T11-12:  Mild facet hypertrophy.  No stenosis. MRI LUMBAR SPINE FINDINGS Segmentation: There appear to be 5 lumbar type vertebral bodies. However, when counting from the dens superiorly there only appear to be 11 type thoracic vertebral bodies. Lowest well-formed disc space will be labeled L5-S1. Alignment: Mild levoscoliosis. Alignment otherwise normal with preservation of the normal lumbar lordosis. No listhesis. Vertebrae: Chronic compression deformity involving the L1 vertebral body with sequelae of prior vertebral augmentation. Associated height loss of up to 50-60 % with 5 mm bony retropulsion. Additional chronic compression deformity  involving the L2 vertebral body with sequelae of vertebral augmentation. Associated mild approximate 25% height loss without bony retropulsion. Chronic compression deformity at L4 with up to 40-50% height loss without bony retropulsion. Additional chronic compression deformity at L5 with sequelae of prior vertebral augmentation. Height loss measures up to approximately 40% without bony retropulsion. No acute compression fracture within the lumbar spine. Underlying bone marrow signal intensity heterogeneous without discrete or worrisome osseous lesions. No abnormal marrow edema. Conus medullaris and cauda equina: Conus extends to the L2 level. Conus and cauda equina appear normal. Paraspinal and other soft tissues: Paraspinous soft tissues demonstrate no acute finding. Chronic fatty atrophy noted within the posterior paraspinous and psoas musculature bilaterally. Atherosclerotic change noted within the aorta. Bilateral renal cyst noted. Disc levels: L1-2: 5 mm bony retropulsion related to the chronic L1 compression fracture. Retropulsed bone indents the ventral thecal sac. Mild facet hypertrophy. Resultant mild to moderate spinal stenosis. Mild bilateral L1 foraminal narrowing. L2-3: Mild  disc bulge with disc desiccation. Mild bilateral facet and ligament flavum hypertrophy. No significant spinal stenosis. Mild bilateral L2 foraminal narrowing, left greater than right. L3-4: Diffuse disc bulge with disc desiccation. Disc bulging asymmetric to the right with associated right far lateral reactive endplate changes with marginal endplate osteophytic spurring. Mild to moderate facet and ligament flavum hypertrophy. Epidural lipomatosis. Resultant moderate spinal stenosis. Moderate right with mild left L3 foraminal narrowing. L4-5: Mild diffuse disc bulge with disc desiccation. Disc bulging asymmetric to the right with superimposed central and right foraminal annular fissures. Moderate facet and ligament flavum hypertrophy. Epidural lipomatosis. Resultant moderate spinal stenosis. Moderate bilateral L4 foraminal narrowing, right worse than left. L5-S1: Mild diffuse disc bulge with disc desiccation. Epidural lipomatosis. Mild to moderate facet hypertrophy. Compression of the distal thecal sac by the epidural fat. Mild right with moderate left L5 foraminal narrowing. IMPRESSION: MR THORACIC SPINE IMPRESSION 1. Acute T6 compression fracture with up to 50% height loss and 4 mm bony retropulsion with resultant mild to moderate spinal stenosis. 2. No other acute abnormality within the thoracic spine. No other significant stenosis. 3. Mild facet hypertrophy within the lower thoracic spine. MR LUMBAR SPINE IMPRESSION 1. No acute compression fracture or other abnormality within the lumbar spine. 2. Multiple chronic compression fractures with sequelae of prior vertebral augmentation involving the L1, L2, L4, and L5 vertebral bodies as above. 3. Multilevel degenerative spondylolysis with resultant moderate spinal stenosis at L1-2, L3-4 and L4-5. 4. Moderate right L3, bilateral L4, and left L5 foraminal narrowing related to disc bulging and facet hypertrophy. Electronically Signed   By: Jeannine Boga M.D.   On:  11/12/2018 22:47   Mr Lumbar Spine Wo Contrast  Result Date: 11/12/2018 CLINICAL DATA:  Initial evaluation for mid and lower back pain. Evaluate for possible compression fractures. EXAM: MRI THORACIC AND LUMBAR SPINE WITHOUT CONTRAST TECHNIQUE: Multiplanar and multiecho pulse sequences of the thoracic and lumbar spine were obtained without intravenous contrast. COMPARISON:  None available. FINDINGS: MRI THORACIC SPINE FINDINGS Alignment: Mild roto dextroscoliosis of the thoracic spine. Alignment otherwise normal with preservation of the normal thoracic kyphosis. No listhesis. Vertebrae: There is an acute compression fracture involving the T6 vertebral body. Associated height loss of up to 50% with 4 mm bony retropulsion. This is benign/mechanical in appearance. The fracture appears largely confined to the T6 vertebral body itself, although reactive marrow edema extends into the posterior elements bilaterally. Vertebral body heights otherwise maintained without evidence for acute or chronic fracture. Chronic compression deformities with sequelae  of prior vertebral augmentation noted at L1 and L2. Underlying bone marrow signal intensity within normal limits. No discrete or worrisome osseous lesions. No other abnormal marrow edema. Cord: Signal intensity within the thoracic spinal cord is normal. Normal cord caliber and morphology. Conus medullaris terminates at approximately the L2 level. Paraspinal and other soft tissues: Mild paraspinous edema adjacent to the T6 compression fracture. Paraspinous soft tissues demonstrate no other acute finding. Atherosclerotic change noted within the aorta. Scattered T2 hyperintense renal cysts noted bilaterally, largest of which seen on the right and measures 19 mm in diameter. Remainder of the visualized visceral structures otherwise unremarkable. Visualized lungs are grossly clear. Disc levels: T1-2:  Unremarkable. T2-3: Unremarkable. T3-4:  Unremarkable. T4-5:  Unremarkable.  T5-6: 4 mm bony retropulsion related to the acute T6 compression fracture. Ventral CSF largely effaced by retropulsed bone. Superimposed mild facet hypertrophy. Dorsal epidural lipomatosis. Resultant mild to moderate spinal stenosis. Foramina remain patent. T6-7:  Epidural lipomatosis with resultant mild spinal stenosis. T7-8: Mild dorsal epidural lipomatosis. No significant spinal stenosis. T8-9:  Unremarkable. T9-10: Mild bilateral facet hypertrophy.  No stenosis. T10-11:  Mild facet hypertrophy.  No stenosis. T11-12:  Mild facet hypertrophy.  No stenosis. MRI LUMBAR SPINE FINDINGS Segmentation: There appear to be 5 lumbar type vertebral bodies. However, when counting from the dens superiorly there only appear to be 11 type thoracic vertebral bodies. Lowest well-formed disc space will be labeled L5-S1. Alignment: Mild levoscoliosis. Alignment otherwise normal with preservation of the normal lumbar lordosis. No listhesis. Vertebrae: Chronic compression deformity involving the L1 vertebral body with sequelae of prior vertebral augmentation. Associated height loss of up to 50-60 % with 5 mm bony retropulsion. Additional chronic compression deformity involving the L2 vertebral body with sequelae of vertebral augmentation. Associated mild approximate 25% height loss without bony retropulsion. Chronic compression deformity at L4 with up to 40-50% height loss without bony retropulsion. Additional chronic compression deformity at L5 with sequelae of prior vertebral augmentation. Height loss measures up to approximately 40% without bony retropulsion. No acute compression fracture within the lumbar spine. Underlying bone marrow signal intensity heterogeneous without discrete or worrisome osseous lesions. No abnormal marrow edema. Conus medullaris and cauda equina: Conus extends to the L2 level. Conus and cauda equina appear normal. Paraspinal and other soft tissues: Paraspinous soft tissues demonstrate no acute finding.  Chronic fatty atrophy noted within the posterior paraspinous and psoas musculature bilaterally. Atherosclerotic change noted within the aorta. Bilateral renal cyst noted. Disc levels: L1-2: 5 mm bony retropulsion related to the chronic L1 compression fracture. Retropulsed bone indents the ventral thecal sac. Mild facet hypertrophy. Resultant mild to moderate spinal stenosis. Mild bilateral L1 foraminal narrowing. L2-3: Mild disc bulge with disc desiccation. Mild bilateral facet and ligament flavum hypertrophy. No significant spinal stenosis. Mild bilateral L2 foraminal narrowing, left greater than right. L3-4: Diffuse disc bulge with disc desiccation. Disc bulging asymmetric to the right with associated right far lateral reactive endplate changes with marginal endplate osteophytic spurring. Mild to moderate facet and ligament flavum hypertrophy. Epidural lipomatosis. Resultant moderate spinal stenosis. Moderate right with mild left L3 foraminal narrowing. L4-5: Mild diffuse disc bulge with disc desiccation. Disc bulging asymmetric to the right with superimposed central and right foraminal annular fissures. Moderate facet and ligament flavum hypertrophy. Epidural lipomatosis. Resultant moderate spinal stenosis. Moderate bilateral L4 foraminal narrowing, right worse than left. L5-S1: Mild diffuse disc bulge with disc desiccation. Epidural lipomatosis. Mild to moderate facet hypertrophy. Compression of the distal thecal sac by the epidural  fat. Mild right with moderate left L5 foraminal narrowing. IMPRESSION: MR THORACIC SPINE IMPRESSION 1. Acute T6 compression fracture with up to 50% height loss and 4 mm bony retropulsion with resultant mild to moderate spinal stenosis. 2. No other acute abnormality within the thoracic spine. No other significant stenosis. 3. Mild facet hypertrophy within the lower thoracic spine. MR LUMBAR SPINE IMPRESSION 1. No acute compression fracture or other abnormality within the lumbar spine.  2. Multiple chronic compression fractures with sequelae of prior vertebral augmentation involving the L1, L2, L4, and L5 vertebral bodies as above. 3. Multilevel degenerative spondylolysis with resultant moderate spinal stenosis at L1-2, L3-4 and L4-5. 4. Moderate right L3, bilateral L4, and left L5 foraminal narrowing related to disc bulging and facet hypertrophy. Electronically Signed   By: Jeannine Boga M.D.   On: 11/12/2018 22:47   Dg Chest Port 1 View  Result Date: 11/12/2018 CLINICAL DATA:  Dyspnea.  Fever and pain. EXAM: PORTABLE CHEST 1 VIEW COMPARISON:  11/12/2018 at 1245 hours FINDINGS: Portable semi upright view of the chest was obtained. Linear densities in the left lower chest are similar to the recent comparison examination could represent scarring or atelectasis. Slightly prominent lung markings appear chronic. Old fracture of the right sixth rib. Heart size is within normal limits stable. Atherosclerotic calcifications at the aortic arch. Negative for a pneumothorax. IMPRESSION: Stable chest radiograph findings from the recent comparison examination. Linear densities in left lower chest are suggestive for subsegmental atelectasis versus scarring. Electronically Signed   By: Markus Daft M.D.   On: 11/12/2018 13:43   Ct Renal Stone Study  Result Date: 11/03/2018 CLINICAL DATA:  Worsening right flank pain.  Urinary frequency. EXAM: CT ABDOMEN AND PELVIS WITHOUT CONTRAST TECHNIQUE: Multidetector CT imaging of the abdomen and pelvis was performed following the standard protocol without IV contrast. COMPARISON:  None. FINDINGS: Lower chest: No acute findings. Hepatobiliary: No mass visualized on this unenhanced exam. Gallbladder is unremarkable. Pancreas: No mass or inflammatory process visualized on this unenhanced exam. Spleen:  Within normal limits in size. Adrenals/Urinary tract: A small fluid attenuation right renal cyst is noted. No evidence of urolithiasis or hydronephrosis.  Unremarkable unopacified urinary bladder. Stomach/Bowel: No evidence of obstruction, inflammatory process, or abnormal fluid collections. Normal appendix visualized. Vascular/Lymphatic: No pathologically enlarged lymph nodes identified. No evidence of abdominal aortic aneurysm. Aortic atherosclerosis. Reproductive:  No mass or other significant abnormality. Other:  None. Musculoskeletal: No suspicious bone lesions identified. Generalized osteopenia noted, with several old lumbar vertebral compression fractures and vertebroplasties. IMPRESSION: No acute findings. No evidence of ureteral calculi or hydronephrosis. Electronically Signed   By: Earle Gell M.D.   On: 11/03/2018 01:03    Labs:  CBC: Recent Labs    07/22/18 1905 07/22/18 1918 11/02/18 2359 11/12/18 1230 11/13/18 0435  WBC 8.1  --  10.2 13.1* 12.0*  HGB 11.1* 11.9* 13.4 15.7* 13.0  HCT 34.1* 35.0* 39.8 48.7* 41.5  PLT 245  --  314 463* 354    COAGS: Recent Labs    11/12/18 1230  INR 0.97    BMP: Recent Labs    09/18/18 1309 11/03/18 0115 11/12/18 1230 11/12/18 2308 11/13/18 0435  NA 141 129* 147*  --  150*  K 4.3 4.9 4.5  --  3.6  CL 101 94* 99  --  110  CO2 25 25 32  --  28  GLUCOSE 150* 300* 585* 675* 72  BUN 15 15 32*  --  34*  CALCIUM 9.4 8.5*  10.0  --  8.7*  CREATININE 0.67 0.67 0.90  --  0.65  GFRNONAA 84 >60 >60  --  >60  GFRAA 97 >60 >60  --  >60    LIVER FUNCTION TESTS: Recent Labs    07/22/18 1905 11/12/18 1230  BILITOT 0.6 0.9  AST 28 16  ALT 16 16  ALKPHOS 38 82  PROT 7.8 9.3*  ALBUMIN 4.0 4.1    TUMOR MARKERS: No results for input(s): AFPTM, CEA, CA199, CHROMGRNA in the last 8760 hours.  Assessment and Plan: Back pain T6 fracture on MRI Incomplete hx at this time, though per the admission H&P, it sounds as though the pts primary c/o is of lower back pain, which does not correspond with the T6 fracture location.  Pt also admitted with encephalopathy secondary to uncontrolled  hyperglycemia, this is improving.  MRI was reviewed with Dr. Estanislado Pandy who feels pt may be a candidate for VP of T6 IF her hx and exam correlate clinically. Treatment of T6 will not change any lower back sxs she has. Hopefully as her mental status improves and we can reach the family, we can get a more accurate assessment. In the meantime, her Plavix will need to be held a minimum of 5 days for safe procedure. IR will continue to follow along   Thank you for this interesting consult.  I greatly enjoyed meeting Amy Hood and look forward to participating in their care.  A copy of this report was sent to the requesting provider on this date.  Electronically Signed: Ascencion Dike, PA-C 11/13/2018, 2:28 PM   I spent a total of 15 minutes in face to face in clinical consultation, greater than 50% of which was counseling/coordinating care for T6 fracture

## 2018-11-14 LAB — CBC WITH DIFFERENTIAL/PLATELET
Abs Immature Granulocytes: 0.04 10*3/uL (ref 0.00–0.07)
BASOS ABS: 0 10*3/uL (ref 0.0–0.1)
Basophils Relative: 0 %
Eosinophils Absolute: 0.1 10*3/uL (ref 0.0–0.5)
Eosinophils Relative: 1 %
HEMATOCRIT: 37.5 % (ref 36.0–46.0)
Hemoglobin: 11.9 g/dL — ABNORMAL LOW (ref 12.0–15.0)
Immature Granulocytes: 0 %
LYMPHS ABS: 1.5 10*3/uL (ref 0.7–4.0)
Lymphocytes Relative: 15 %
MCH: 25.8 pg — ABNORMAL LOW (ref 26.0–34.0)
MCHC: 31.7 g/dL (ref 30.0–36.0)
MCV: 81.2 fL (ref 80.0–100.0)
Monocytes Absolute: 0.6 10*3/uL (ref 0.1–1.0)
Monocytes Relative: 6 %
Neutro Abs: 7.7 10*3/uL (ref 1.7–7.7)
Neutrophils Relative %: 78 %
Platelets: 261 10*3/uL (ref 150–400)
RBC: 4.62 MIL/uL (ref 3.87–5.11)
RDW: 13.2 % (ref 11.5–15.5)
WBC: 9.9 10*3/uL (ref 4.0–10.5)
nRBC: 0 % (ref 0.0–0.2)

## 2018-11-14 LAB — COMPREHENSIVE METABOLIC PANEL
ALK PHOS: 56 U/L (ref 38–126)
ALT: 14 U/L (ref 0–44)
AST: 20 U/L (ref 15–41)
Albumin: 2.7 g/dL — ABNORMAL LOW (ref 3.5–5.0)
Anion gap: 8 (ref 5–15)
BUN: 18 mg/dL (ref 8–23)
CO2: 27 mmol/L (ref 22–32)
Calcium: 8.1 mg/dL — ABNORMAL LOW (ref 8.9–10.3)
Chloride: 105 mmol/L (ref 98–111)
Creatinine, Ser: 0.51 mg/dL (ref 0.44–1.00)
GFR calc Af Amer: >60 mL/min (ref >60)
GFR calc non Af Amer: >60 mL/min (ref >60)
Glucose, Bld: 220 mg/dL — ABNORMAL HIGH (ref 70–99)
Potassium: 3.9 mmol/L (ref 3.5–5.1)
Sodium: 140 mmol/L (ref 135–145)
TOTAL PROTEIN: 6 g/dL — AB (ref 6.5–8.1)
Total Bilirubin: 0.7 mg/dL (ref 0.3–1.2)

## 2018-11-14 LAB — GLUCOSE, CAPILLARY
GLUCOSE-CAPILLARY: 255 mg/dL — AB (ref 70–99)
Glucose-Capillary: 216 mg/dL — ABNORMAL HIGH (ref 70–99)
Glucose-Capillary: 239 mg/dL — ABNORMAL HIGH (ref 70–99)
Glucose-Capillary: 251 mg/dL — ABNORMAL HIGH (ref 70–99)
Glucose-Capillary: 273 mg/dL — ABNORMAL HIGH (ref 70–99)

## 2018-11-14 LAB — HEMOGLOBIN A1C
Hgb A1c MFr Bld: 8 % — ABNORMAL HIGH (ref 4.8–5.6)
MEAN PLASMA GLUCOSE: 182.9 mg/dL

## 2018-11-14 LAB — LACTIC ACID, PLASMA
Lactic Acid, Venous: 2.2 mmol/L (ref 0.5–1.9)
Lactic Acid, Venous: 1.7 mmol/L (ref 0.5–1.9)

## 2018-11-14 LAB — MAGNESIUM: Magnesium: 2.2 mg/dL (ref 1.7–2.4)

## 2018-11-14 LAB — PHOSPHORUS: Phosphorus: 3.2 mg/dL (ref 2.5–4.6)

## 2018-11-14 MED ORDER — INSULIN GLARGINE 100 UNIT/ML ~~LOC~~ SOLN: 15.0000 [IU] | Freq: Every day | SUBCUTANEOUS | Status: DC

## 2018-11-14 MED ORDER — INSULIN GLARGINE 100 UNIT/ML ~~LOC~~ SOLN
12.0000 [IU] | Freq: Every day | SUBCUTANEOUS | Status: DC
  Administered 2018-11-15 – 2018-11-19 (×5): 12 [IU] via SUBCUTANEOUS
  Filled 2018-11-14 (×5): qty 0.12

## 2018-11-14 MED ORDER — SODIUM CHLORIDE 0.9 % IV BOLUS
500.0000 mL | Freq: Once | INTRAVENOUS | Status: AC
  Administered 2018-11-14: 500 mL via INTRAVENOUS

## 2018-11-14 NOTE — Progress Notes (Signed)
PROGRESS NOTE    Amy Hood  UJW:[id number] DOB: [birth year] DOA: 11/12/2018 PCP: Georgann HousekeeperHusain, Karrar, MD   Brief Narrative:  HPI per Dr. Erin HearingMauricio Arrien on 11/12/18 Amy Hood is a 77 y.o. female with medical history significant of chronic arthritis, coronary artery disease, atrial fibrillation, dyslipidemia, hypertension, and type 2 diabetes mellitus.  Patient presents with severe lower back pain, sharp in nature, severe in intensity, worse with movement, no improving factors, associated ambulatory dysfunction, poor appetite, and confusion.  Patient was seen November 25 in the emergency department for the same complaint, she was diagnosed with a urinary tract infection and was prescribed cephalexin.  She did complete a course of oral antibiotics at home with no improvement of her symptoms.   For the last 10 days patient has been in bedridden due to severe back pain, had difficulty seating that has affected her p.o. Intake. Patient usually ambulates with a walker.  She takes opiates for pain control.  All the information is obtained through her daughter due to patient's language barriers.   **She speaks [place] and her daughter translated while she is in the room.  Patient states that her back pain is improved a little bit and patient daughter thinks that her mentation is improved back to baseline.  I have called IR for possible vertebroplasty given her acute back pain and they feel that she would benefit if her symptoms correlate clinically. Upon palpation of her thoracic spine she had severe pain.  Assessment & Plan:   Active Problems:   Hyperglycemia  Severe back pain complicated by metabolic encephalopathy; Encephalopathy is improved -Patient had experiencing worsening of her back pain, affecting her p.o. intake and ambulation.   -She failed outpatient therapy.   -Will proceed with further imaging with spine MRI -MRI of the Spine showed acute T6 compression fracture with up to 50%  height loss in the form of meter bony retropulsion resulting mild to moderate spinal stenosis and no other acute abnormality within the thoracic spine and no other significant stenosis.  There is no acute compression fracture abnormality in the lumbar spine but there is multiple chronic compression fractures with sequelae of prior vertebral augmentation of the L1, L2, L4 and L5 vertebral bodies.  There is also multilevel degenerative spondylosis with resultant moderate spinal stenosis at L1-L2, L3-L4, and L4-L5.  There is also moderate right L3, bilateral L4 and left L5 foraminal narrowing that the bulging disc and facet hypertrophy -Will consider discussing with neurosurgery about lumbar spine changes but likely can be done in the outpatient -I have consulted for possible vertebroplasty and we have held her Plavix and this is Day 2/5 that it is held -Continue pain control with IV morphine, neurochecks every 4 hours -Physical therapy to evaluate and Treat and recommending Home Health PT -Hydration with IV fluids with NS changed to D5 1/2 NS at 75 mL/hr for now and will Decrease to 50 mL/hr  Type 2 Diabetes Mellitus with uncontrolled hyperglycemia -Added Sensitive insulin sliding scale for glucose coverage and monitoring, continue to calculate insulin requirements. -Patient at home on metformin -Started on Lantus 10 units and increased dose to 12 units daily; Received 15 units this AM -CBG's ranging from 216-351 -Diabetes Education Coordinator to be consulted  Coronary artery disease s/p Stenting -Chest pain-free; C/w aspirin and rosuvastatin. -Hold Clopidogrel for possible Intervention by IR for Vertebroplasty/Kyphoplasty  Hypertension -BP was 151/73 -Continue blood pressure control with Carvedilol, and Losartan.  Hypernatremia, improved -May have been factoring into her Encephalopathy -Na+  peaked at 150 and is now 140 -Continue to Monitor and if continues to worsen will place on D5 gtt;  IVF currently changed to D5W 1/2 NS and reduced rate to 50 mL/hr  Leukocytosis -Likely reactive in the setting of pain -Improving and WBC is now 9.9 -Continue monitor for signs and symptoms of infection -Repeat CBC in a.m.  Constipation -Bowel regimen started with senna docusate 1 tab p.o. twice daily, MiraLAX 17 g p.o. twice daily, and bisacodyl suppository  Obesity -Estimated body mass index is 34.92 kg/m as calculated from the following:   Height as of this encounter: 4\' 9"  (1.448 m).   Weight as of this encounter: 73.2 kg. -Weight Loss Counseling Advised  Lactic Acidosis, improved -In the setting of Recent Metformin Use and Dehydration from poor po Intake -LA was trending down and now 1.7 -Given IVF Bolus of 500 mL today -Will not repeat LA level as she is improve  DVT prophylaxis: Enoxaparin 40 mg sq q24h Code Status: FULL CODE Family Communication: Discussed with Family present at bedside Disposition Plan: Continue to Treat Pain and have IR evaluate for possible Vertebroplasty  Consultants:   IR  Procedures:  MRI   Antimicrobials:  Anti-infectives (From admission, onward)   Start     Dose/Rate Route Frequency Ordered Stop   11/12/18 1330  cefTRIAXone (ROCEPHIN) 1 g in sodium chloride 0.9 % 100 mL IVPB     1 g 200 mL/hr over 30 Minutes Intravenous  Once 11/12/18 1317 11/12/18 1525     Subjective: Seen and examined at bedside and had her daughter translate she states that her back pain currently is a 2 out of 10 but then when I palpate on her back she screamed in pain and was unable to quantify it and states "it hurt real bad."  Family at bedside to help translate.  No nausea or vomiting.  States that her constipation has improved with a bowel regimen would like to continue a bowel regimen for now.  Was also complaining of flank pain to both sides.  No other concerns or complaints at this time and PT recommending Home Health PT.   Objective: Vitals:   11/13/18  1421 11/13/18 2124 11/14/18 0532 11/14/18 1326  BP: (!) 148/82 138/74 132/82 (!) 151/73  Pulse: 69 72 84 62  Resp: 16 14 16 16   Temp: 98.1 F (36.7 C) 98.2 F (36.8 C) 98.4 F (36.9 C) 97.8 F (36.6 C)  TempSrc: Oral Oral Oral Oral  SpO2: 95% 97% 98% 97%  Weight:      Height:        Intake/Output Summary (Last 24 hours) at 11/14/2018 1424 Last data filed at 11/14/2018 1407 Gross per 24 hour  Intake 2441.75 ml  Output 900 ml  Net 1541.75 ml   Filed Weights   11/12/18 1257  Weight: 73.2 kg   Examination: Physical Exam:  Constitutional: Well-nourished, well-developed obese [place] female currently no acute distress and appears calm but cries out and significant pain when her thoracic spine is palpated. Eyes: Lids and conjunctive are normal.  Sclera anicteric ENMT: External ears and nose appear normal grossly normal hearing Neck: Appears supple no JVD Respiratory: Slightly diminished to auscultation bilaterally with no appreciable wheezing, rales, rhonchi.  Patient not tachypneic wheezing excess muscle breathe Cardiovascular: Regular rate and rhythm.  No appreciable murmurs, rubs or gallops.  No lower extremity edema appreciated Abdomen: Soft, nontender, distended slightly secondary body habitus.  Bowel sounds present all 4 quadrants. GU: Deferred Musculoskeletal:  No clubbing or cyanosis.  Has significant pain on palpation of her thoracic spine Skin: No appreciable rashes or lesions on limited skin evaluation Neurologic: Cranial nerves II through XII grossly intact no appreciable focal deficits Psychiatric: Normal judgment and insight.  Patient is awake alert and oriented x3  Data Reviewed: I have personally reviewed following labs and imaging studies  CBC: Recent Labs  Lab 11/12/18 1230 11/13/18 0435 11/14/18 0531  WBC 13.1* 12.0* 9.9  NEUTROABS 11.3*  --  7.7  HGB 15.7* 13.0 11.9*  HCT 48.7* 41.5 37.5  MCV 82.5 82.5 81.2  PLT 463* 354 261   Basic Metabolic  Panel: Recent Labs  Lab 11/12/18 1230 11/12/18 2308 11/13/18 0435 11/14/18 0531  NA 147*  --  150* 140  K 4.5  --  3.6 3.9  CL 99  --  110 105  CO2 32  --  28 27  GLUCOSE 585* 675* 72 220*  BUN 32*  --  34* 18  CREATININE 0.90  --  0.65 0.51  CALCIUM 10.0  --  8.7* 8.1*  MG  --   --   --  2.2  PHOS  --   --   --  3.2   GFR: Estimated Creatinine Clearance: 47.9 mL/min (by C-G formula based on SCr of 0.51 mg/dL). Liver Function Tests: Recent Labs  Lab 11/12/18 1230 11/14/18 0531  AST 16 20  ALT 16 14  ALKPHOS 82 56  BILITOT 0.9 0.7  PROT 9.3* 6.0*  ALBUMIN 4.1 2.7*   No results for input(s): LIPASE, AMYLASE in the last 168 hours. No results for input(s): AMMONIA in the last 168 hours. Coagulation Profile: Recent Labs  Lab 11/12/18 1230  INR 0.97   Cardiac Enzymes: Recent Labs  Lab 11/12/18 1230  CKTOTAL 34*   BNP (last 3 results) No results for input(s): PROBNP in the last 8760 hours. HbA1C: No results for input(s): HGBA1C in the last 72 hours. CBG: Recent Labs  Lab 11/13/18 1647 11/13/18 2115 11/14/18 0005 11/14/18 0730 11/14/18 1130  GLUCAP 263* 351* 216* 255* 239*   Lipid Profile: No results for input(s): CHOL, HDL, LDLCALC, TRIG, CHOLHDL, LDLDIRECT in the last 72 hours. Thyroid Function Tests: No results for input(s): TSH, T4TOTAL, FREET4, T3FREE, THYROIDAB in the last 72 hours. Anemia Panel: No results for input(s): VITAMINB12, FOLATE, FERRITIN, TIBC, IRON, RETICCTPCT in the last 72 hours. Sepsis Labs: Recent Labs  Lab 11/12/18 1235 11/12/18 1358 11/14/18 0900 11/14/18 1232  LATICACIDVEN 2.75* 2.51* 2.2* 1.7    Recent Results (from the past 240 hour(s))  Culture, blood (Routine x 2)     Status: None (Preliminary result)   Collection Time: 11/12/18 12:18 PM  Result Value Ref Range Status   Specimen Description   Final    BLOOD LEFT ANTECUBITAL Performed at [facility], [address] [place], [place] [address]      Special Requests   Final    BOTTLES DRAWN AEROBIC AND ANAEROBIC Blood Culture adequate volume   Culture   Final    NO GROWTH 1 DAY Performed at [facility], [address], [place], [place] [address]    Report Status PENDING  Incomplete    RN Pressure Injury Documentation: Pressure Injury 12/31/16 Stage I -  Intact skin with non-blanchable redness of a localized area usually over a bony prominence. baseball sized area of redness (Active)  12/31/16 1702   Location: Sacrum  Location Orientation: Mid  Staging: Stage I -  Intact skin with non-blanchable redness of a localized area usually over a bony prominence.  Wound Description (Comments): baseball sized area of redness  Present on Admission: Yes   Radiology Studies: Mr Thoracic Spine Wo Contrast  Result Date: 11/12/2018 CLINICAL DATA:  Initial evaluation for mid and lower back pain. Evaluate for possible compression fractures. EXAM: MRI THORACIC AND LUMBAR SPINE WITHOUT CONTRAST TECHNIQUE: Multiplanar and multiecho pulse sequences of the thoracic and lumbar spine were obtained without intravenous contrast. COMPARISON:  None available. FINDINGS: MRI THORACIC SPINE FINDINGS Alignment: Mild roto dextroscoliosis of the thoracic spine. Alignment otherwise normal with preservation of the normal thoracic kyphosis. No listhesis. Vertebrae: There is an acute compression fracture involving the T6 vertebral body. Associated height loss of up to 50% with 4 mm bony retropulsion. This is benign/mechanical in appearance. The fracture appears largely confined to the T6 vertebral body itself, although reactive marrow edema extends into the posterior elements bilaterally. Vertebral body heights otherwise maintained without evidence for acute or chronic fracture. Chronic compression deformities with sequelae of prior vertebral augmentation noted at L1 and L2. Underlying bone marrow signal intensity within normal limits. No discrete or worrisome osseous  lesions. No other abnormal marrow edema. Cord: Signal intensity within the thoracic spinal cord is normal. Normal cord caliber and morphology. Conus medullaris terminates at approximately the L2 level. Paraspinal and other soft tissues: Mild paraspinous edema adjacent to the T6 compression fracture. Paraspinous soft tissues demonstrate no other acute finding. Atherosclerotic change noted within the aorta. Scattered T2 hyperintense renal cysts noted bilaterally, largest of which seen on the right and measures 19 mm in diameter. Remainder of the visualized visceral structures otherwise unremarkable. Visualized lungs are grossly clear. Disc levels: T1-2:  Unremarkable. T2-3: Unremarkable. T3-4:  Unremarkable. T4-5:  Unremarkable. T5-6: 4 mm bony retropulsion related to the acute T6 compression fracture. Ventral CSF largely effaced by retropulsed bone. Superimposed mild facet hypertrophy. Dorsal epidural lipomatosis. Resultant mild to moderate spinal stenosis. Foramina remain patent. T6-7:  Epidural lipomatosis with resultant mild spinal stenosis. T7-8: Mild dorsal epidural lipomatosis. No significant spinal stenosis. T8-9:  Unremarkable. T9-10: Mild bilateral facet hypertrophy.  No stenosis. T10-11:  Mild facet hypertrophy.  No stenosis. T11-12:  Mild facet hypertrophy.  No stenosis. MRI LUMBAR SPINE FINDINGS Segmentation: There appear to be 5 lumbar type vertebral bodies. However, when counting from the dens superiorly there only appear to be 11 type thoracic vertebral bodies. Lowest well-formed disc space will be labeled L5-S1. Alignment: Mild levoscoliosis. Alignment otherwise normal with preservation of the normal lumbar lordosis. No listhesis. Vertebrae: Chronic compression deformity involving the L1 vertebral body with sequelae of prior vertebral augmentation. Associated height loss of up to 50-60 % with 5 mm bony retropulsion. Additional chronic compression deformity involving the L2 vertebral body with sequelae  of vertebral augmentation. Associated mild approximate 25% height loss without bony retropulsion. Chronic compression deformity at L4 with up to 40-50% height loss without bony retropulsion. Additional chronic compression deformity at L5 with sequelae of prior vertebral augmentation. Height loss measures up to approximately 40% without bony retropulsion. No acute compression fracture within the lumbar spine. Underlying bone marrow signal intensity heterogeneous without discrete or worrisome osseous lesions. No abnormal marrow edema. Conus medullaris and cauda equina: Conus extends to the L2 level. Conus and cauda equina appear normal. Paraspinal and other soft tissues: Paraspinous soft tissues demonstrate no acute finding. Chronic fatty atrophy noted within the posterior paraspinous and psoas musculature bilaterally. Atherosclerotic change noted within the aorta. Bilateral renal cyst  noted. Disc levels: L1-2: 5 mm bony retropulsion related to the chronic L1 compression fracture. Retropulsed bone indents the ventral thecal sac. Mild facet hypertrophy. Resultant mild to moderate spinal stenosis. Mild bilateral L1 foraminal narrowing. L2-3: Mild disc bulge with disc desiccation. Mild bilateral facet and ligament flavum hypertrophy. No significant spinal stenosis. Mild bilateral L2 foraminal narrowing, left greater than right. L3-4: Diffuse disc bulge with disc desiccation. Disc bulging asymmetric to the right with associated right far lateral reactive endplate changes with marginal endplate osteophytic spurring. Mild to moderate facet and ligament flavum hypertrophy. Epidural lipomatosis. Resultant moderate spinal stenosis. Moderate right with mild left L3 foraminal narrowing. L4-5: Mild diffuse disc bulge with disc desiccation. Disc bulging asymmetric to the right with superimposed central and right foraminal annular fissures. Moderate facet and ligament flavum hypertrophy. Epidural lipomatosis. Resultant moderate  spinal stenosis. Moderate bilateral L4 foraminal narrowing, right worse than left. L5-S1: Mild diffuse disc bulge with disc desiccation. Epidural lipomatosis. Mild to moderate facet hypertrophy. Compression of the distal thecal sac by the epidural fat. Mild right with moderate left L5 foraminal narrowing. IMPRESSION: MR THORACIC SPINE IMPRESSION 1. Acute T6 compression fracture with up to 50% height loss and 4 mm bony retropulsion with resultant mild to moderate spinal stenosis. 2. No other acute abnormality within the thoracic spine. No other significant stenosis. 3. Mild facet hypertrophy within the lower thoracic spine. MR LUMBAR SPINE IMPRESSION 1. No acute compression fracture or other abnormality within the lumbar spine. 2. Multiple chronic compression fractures with sequelae of prior vertebral augmentation involving the L1, L2, L4, and L5 vertebral bodies as above. 3. Multilevel degenerative spondylolysis with resultant moderate spinal stenosis at L1-2, L3-4 and L4-5. 4. Moderate right L3, bilateral L4, and left L5 foraminal narrowing related to disc bulging and facet hypertrophy. Electronically Signed   By: Rise Mu M.D.   On: 11/12/2018 22:47   Mr Lumbar Spine Wo Contrast  Result Date: 11/12/2018 CLINICAL DATA:  Initial evaluation for mid and lower back pain. Evaluate for possible compression fractures. EXAM: MRI THORACIC AND LUMBAR SPINE WITHOUT CONTRAST TECHNIQUE: Multiplanar and multiecho pulse sequences of the thoracic and lumbar spine were obtained without intravenous contrast. COMPARISON:  None available. FINDINGS: MRI THORACIC SPINE FINDINGS Alignment: Mild roto dextroscoliosis of the thoracic spine. Alignment otherwise normal with preservation of the normal thoracic kyphosis. No listhesis. Vertebrae: There is an acute compression fracture involving the T6 vertebral body. Associated height loss of up to 50% with 4 mm bony retropulsion. This is benign/mechanical in appearance. The  fracture appears largely confined to the T6 vertebral body itself, although reactive marrow edema extends into the posterior elements bilaterally. Vertebral body heights otherwise maintained without evidence for acute or chronic fracture. Chronic compression deformities with sequelae of prior vertebral augmentation noted at L1 and L2. Underlying bone marrow signal intensity within normal limits. No discrete or worrisome osseous lesions. No other abnormal marrow edema. Cord: Signal intensity within the thoracic spinal cord is normal. Normal cord caliber and morphology. Conus medullaris terminates at approximately the L2 level. Paraspinal and other soft tissues: Mild paraspinous edema adjacent to the T6 compression fracture. Paraspinous soft tissues demonstrate no other acute finding. Atherosclerotic change noted within the aorta. Scattered T2 hyperintense renal cysts noted bilaterally, largest of which seen on the right and measures 19 mm in diameter. Remainder of the visualized visceral structures otherwise unremarkable. Visualized lungs are grossly clear. Disc levels: T1-2:  Unremarkable. T2-3: Unremarkable. T3-4:  Unremarkable. T4-5:  Unremarkable. T5-6: 4 mm bony  retropulsion related to the acute T6 compression fracture. Ventral CSF largely effaced by retropulsed bone. Superimposed mild facet hypertrophy. Dorsal epidural lipomatosis. Resultant mild to moderate spinal stenosis. Foramina remain patent. T6-7:  Epidural lipomatosis with resultant mild spinal stenosis. T7-8: Mild dorsal epidural lipomatosis. No significant spinal stenosis. T8-9:  Unremarkable. T9-10: Mild bilateral facet hypertrophy.  No stenosis. T10-11:  Mild facet hypertrophy.  No stenosis. T11-12:  Mild facet hypertrophy.  No stenosis. MRI LUMBAR SPINE FINDINGS Segmentation: There appear to be 5 lumbar type vertebral bodies. However, when counting from the dens superiorly there only appear to be 11 type thoracic vertebral bodies. Lowest  well-formed disc space will be labeled L5-S1. Alignment: Mild levoscoliosis. Alignment otherwise normal with preservation of the normal lumbar lordosis. No listhesis. Vertebrae: Chronic compression deformity involving the L1 vertebral body with sequelae of prior vertebral augmentation. Associated height loss of up to 50-60 % with 5 mm bony retropulsion. Additional chronic compression deformity involving the L2 vertebral body with sequelae of vertebral augmentation. Associated mild approximate 25% height loss without bony retropulsion. Chronic compression deformity at L4 with up to 40-50% height loss without bony retropulsion. Additional chronic compression deformity at L5 with sequelae of prior vertebral augmentation. Height loss measures up to approximately 40% without bony retropulsion. No acute compression fracture within the lumbar spine. Underlying bone marrow signal intensity heterogeneous without discrete or worrisome osseous lesions. No abnormal marrow edema. Conus medullaris and cauda equina: Conus extends to the L2 level. Conus and cauda equina appear normal. Paraspinal and other soft tissues: Paraspinous soft tissues demonstrate no acute finding. Chronic fatty atrophy noted within the posterior paraspinous and psoas musculature bilaterally. Atherosclerotic change noted within the aorta. Bilateral renal cyst noted. Disc levels: L1-2: 5 mm bony retropulsion related to the chronic L1 compression fracture. Retropulsed bone indents the ventral thecal sac. Mild facet hypertrophy. Resultant mild to moderate spinal stenosis. Mild bilateral L1 foraminal narrowing. L2-3: Mild disc bulge with disc desiccation. Mild bilateral facet and ligament flavum hypertrophy. No significant spinal stenosis. Mild bilateral L2 foraminal narrowing, left greater than right. L3-4: Diffuse disc bulge with disc desiccation. Disc bulging asymmetric to the right with associated right far lateral reactive endplate changes with marginal  endplate osteophytic spurring. Mild to moderate facet and ligament flavum hypertrophy. Epidural lipomatosis. Resultant moderate spinal stenosis. Moderate right with mild left L3 foraminal narrowing. L4-5: Mild diffuse disc bulge with disc desiccation. Disc bulging asymmetric to the right with superimposed central and right foraminal annular fissures. Moderate facet and ligament flavum hypertrophy. Epidural lipomatosis. Resultant moderate spinal stenosis. Moderate bilateral L4 foraminal narrowing, right worse than left. L5-S1: Mild diffuse disc bulge with disc desiccation. Epidural lipomatosis. Mild to moderate facet hypertrophy. Compression of the distal thecal sac by the epidural fat. Mild right with moderate left L5 foraminal narrowing. IMPRESSION: MR THORACIC SPINE IMPRESSION 1. Acute T6 compression fracture with up to 50% height loss and 4 mm bony retropulsion with resultant mild to moderate spinal stenosis. 2. No other acute abnormality within the thoracic spine. No other significant stenosis. 3. Mild facet hypertrophy within the lower thoracic spine. MR LUMBAR SPINE IMPRESSION 1. No acute compression fracture or other abnormality within the lumbar spine. 2. Multiple chronic compression fractures with sequelae of prior vertebral augmentation involving the L1, L2, L4, and L5 vertebral bodies as above. 3. Multilevel degenerative spondylolysis with resultant moderate spinal stenosis at L1-2, L3-4 and L4-5. 4. Moderate right L3, bilateral L4, and left L5 foraminal narrowing related to disc bulging and facet hypertrophy.  Electronically Signed   By: Rise Mu M.D.   On: 11/12/2018 22:47   Scheduled Meds: . aspirin  81 mg Oral Daily  . carvedilol  6.25 mg Oral BID WC  . cholecalciferol  1,000 Units Oral Daily  . enoxaparin (LOVENOX) injection  40 mg Subcutaneous Q24H  . insulin aspart  0-9 Units Subcutaneous TID WC  . [START ON 11/15/2018] insulin glargine  12 Units Subcutaneous Daily  . iron  polysaccharides  150 mg Oral Daily  . polyethylene glycol  17 g Oral BID  . rosuvastatin  20 mg Oral q1800  . senna-docusate  1 tablet Oral BID   Continuous Infusions: . sodium chloride Stopped (11/12/18 1524)  . dextrose 5 % and 0.45% NaCl 75 mL/hr at 11/14/18 1303    LOS: 2 days   Merlene Laughter, DO [facility] PAGER is on AMION  If 7PM-7AM, please contact night-coverage [email or web address] Password [email or web address] 11/14/2018, 2:24 PM

## 2018-11-14 NOTE — Progress Notes (Addendum)
OT Cancellation Note  Patient Details Name: Amy Hood MRN: [id number] DOB: 05-25-1941   Cancelled Treatment:    Reason Eval/Treat Not Completed: Pain limiting ability to participate. Pt resting and with friend translating, pt stating that she was hurting and had been up recently and wanting to rest. Will reattempt at a later time.  Judithann SaugerStephanie Terralyn Matsumura OTR/L Acute Rehabilitation [phone] office number    11/14/2018, 4:07 PM

## 2018-11-14 NOTE — Progress Notes (Signed)
PT Cancellation Note  Patient Details Name: Amy Hood MRN: [id number] DOB: August 04, 1941   Cancelled Treatment:    Reason Eval/Treat Not Completed: Other (comment)(third attempt to see pt today, she was eating breakfast, then was on commode for 2nd and third attempt. Will follow. )   Tamala SerUhlenberg, Gareld Obrecht Kistler PT 11/14/2018  Acute Rehabilitation Services Pager [phone] Office [phone]

## 2018-11-14 NOTE — Progress Notes (Signed)
Critical Lactic Acid lab results received. MD paged. Orders received see MAR

## 2018-11-14 NOTE — Evaluation (Signed)
Physical Therapy Evaluation Patient Details Name: Amy Hood MRN: [id number] DOB: [birth year] Today's Date: 11/14/2018   History of Present Illness  77 y.o. female with medical history significant of chronic arthritis, coronary artery disease, atrial fibrillation, dyslipidemia, hypertension, and type 2 diabetes mellitus.  Patient presents with severe lower back pain, Dx of T6 compression fracture.   Clinical Impression  Pt admitted with above diagnosis. Pt currently with functional limitations due to the deficits listed below (see PT Problem List). Pt ambulated 160' with RW, no loss of balance. She requires mod assist for supine to sit, min assist sit to stand. She has 24* assist available at home from her daughter.  Pt will benefit from skilled PT to increase their independence and safety with mobility to allow discharge to the venue listed below.       Follow Up Recommendations Home health PT;Supervision for mobility/OOB    Equipment Recommendations  None recommended by PT    Recommendations for Other Services       Precautions / Restrictions Precautions Precautions: Fall Restrictions Weight Bearing Restrictions: No      Mobility  Bed Mobility Overal bed mobility: Needs Assistance Bed Mobility: Supine to Sit     Supine to sit: Mod assist     General bed mobility comments: mod A to raise trunk; gave verbal instructions for log roll but pt chose to perform supine to sit  Transfers Overall transfer level: Needs assistance Equipment used: Rolling walker (2 wheeled) Transfers: Sit to/from Stand Sit to Stand: Min assist         General transfer comment: VCs hand placement  Ambulation/Gait Ambulation/Gait assistance: Min guard Gait Distance (Feet): 160 Feet Assistive device: Rolling walker (2 wheeled) Gait Pattern/deviations: Step-through pattern;Trunk flexed Gait velocity: WFL   General Gait Details: pt didn't respond to verbal instructions to step closer to RW,  no loss of balance  Stairs            Wheelchair Mobility    Modified Rankin (Stroke Patients Only)       Balance Overall balance assessment: Modified Independent                                           Pertinent Vitals/Pain Pain Assessment: 0-10 Pain Score: 6  Pain Location: back Pain Descriptors / Indicators: Grimacing;Guarding Pain Intervention(s): Limited activity within patient's tolerance;Monitored during session    Home Living Family/patient expects to be discharged to:: Private residence Living Arrangements: Children;Spouse/significant other Available Help at Discharge: Family;Available 24 hours/day Type of Home: House Home Access: Ramped entrance     Home Layout: One level Home Equipment: Walker - 2 wheels;Cane - single point;Wheelchair - manual Additional Comments: spouse has limited mobility, uses a WC and RW per daughter. Lives with daughter and spouse. Daughter available nearly 24*/day.    Prior Function Level of Independence: Independent with assistive device(s)         Comments: uses RW or cane     Hand Dominance   Dominant Hand: Right    Extremity/Trunk Assessment   Upper Extremity Assessment Upper Extremity Assessment: Overall WFL for tasks assessed    Lower Extremity Assessment Lower Extremity Assessment: Overall WFL for tasks assessed    Cervical / Trunk Assessment Cervical / Trunk Assessment: Normal  Communication   Communication: Prefers language other than English(daughter interpreted)  Cognition Arousal/Alertness: Awake/alert Behavior During Therapy: WFL for tasks assessed/performed  Overall Cognitive Status: Difficult to assess                                 General Comments: follows most, but not all, commands       General Comments      Exercises     Assessment/Plan    PT Assessment Patient needs continued PT services  PT Problem List Decreased activity tolerance;Decreased  balance;Decreased mobility;Pain       PT Treatment Interventions DME instruction;Gait training;Therapeutic exercise;Therapeutic activities    PT Goals (Current goals can be found in the Care Plan section)  Acute Rehab PT Goals Patient Stated Goal: walk farther PT Goal Formulation: With patient/family Time For Goal Achievement: 11/28/18 Potential to Achieve Goals: Good    Frequency Min 3X/week   Barriers to discharge        Co-evaluation               AM-PAC PT "6 Clicks" Mobility  Outcome Measure Help needed turning from your back to your side while in a flat bed without using bedrails?: A Lot Help needed moving from lying on your back to sitting on the side of a flat bed without using bedrails?: A Lot Help needed moving to and from a bed to a chair (including a wheelchair)?: A Little Help needed standing up from a chair using your arms (e.g., wheelchair or bedside chair)?: A Little Help needed to walk in hospital room?: A Little Help needed climbing 3-5 steps with a railing? : A Lot 6 Click Score: 15    End of Session Equipment Utilized During Treatment: Gait belt Activity Tolerance: Patient tolerated treatment well Patient left: in chair;with call bell/phone within reach;with family/visitor present(MD in room) Nurse Communication: Mobility status PT Visit Diagnosis: Difficulty in walking, not elsewhere classified (R26.2);Pain    Time: 2130-8657 PT Time Calculation (min) (ACUTE ONLY): 25 min   Charges:   PT Evaluation $PT Eval Low Complexity: 1 Low PT Treatments $Gait Training: 8-22 mins        Ralene Bathe Kistler PT 11/14/2018  Acute Rehabilitation Services Pager [phone] Office [phone]

## 2018-11-15 LAB — CBC WITH DIFFERENTIAL/PLATELET
Abs Immature Granulocytes: 0.03 10*3/uL (ref 0.00–0.07)
Basophils Absolute: 0 10*3/uL (ref 0.0–0.1)
Basophils Relative: 0 %
EOS ABS: 0.1 10*3/uL (ref 0.0–0.5)
EOS PCT: 1 %
HCT: 38.9 % (ref 36.0–46.0)
Hemoglobin: 12.4 g/dL (ref 12.0–15.0)
Immature Granulocytes: 0 %
Lymphocytes Relative: 13 %
Lymphs Abs: 1.1 10*3/uL (ref 0.7–4.0)
MCH: 25.8 pg — ABNORMAL LOW (ref 26.0–34.0)
MCHC: 31.9 g/dL (ref 30.0–36.0)
MCV: 81 fL (ref 80.0–100.0)
Monocytes Absolute: 0.5 10*3/uL (ref 0.1–1.0)
Monocytes Relative: 6 %
NRBC: 0 % (ref 0.0–0.2)
Neutro Abs: 6.7 10*3/uL (ref 1.7–7.7)
Neutrophils Relative %: 80 %
Platelets: 293 10*3/uL (ref 150–400)
RBC: 4.8 MIL/uL (ref 3.87–5.11)
RDW: 12.9 % (ref 11.5–15.5)
WBC: 8.3 10*3/uL (ref 4.0–10.5)

## 2018-11-15 LAB — GLUCOSE, CAPILLARY
Glucose-Capillary: 270 mg/dL — ABNORMAL HIGH (ref 70–99)
Glucose-Capillary: 320 mg/dL — ABNORMAL HIGH (ref 70–99)
Glucose-Capillary: 228 mg/dL — ABNORMAL HIGH (ref 70–99)
Glucose-Capillary: 198 mg/dL — ABNORMAL HIGH (ref 70–99)

## 2018-11-15 LAB — COMPREHENSIVE METABOLIC PANEL
ALT: 16 U/L (ref 0–44)
ANION GAP: 11 (ref 5–15)
AST: 15 U/L (ref 15–41)
Albumin: 2.8 g/dL — ABNORMAL LOW (ref 3.5–5.0)
Alkaline Phosphatase: 52 U/L (ref 38–126)
BILIRUBIN TOTAL: 0.8 mg/dL (ref 0.3–1.2)
BUN: 9 mg/dL (ref 8–23)
CO2: 26 mmol/L (ref 22–32)
Calcium: 8.1 mg/dL — ABNORMAL LOW (ref 8.9–10.3)
Chloride: 100 mmol/L (ref 98–111)
Creatinine, Ser: 0.51 mg/dL (ref 0.44–1.00)
GFR calc Af Amer: >60 mL/min (ref >60)
GFR calc non Af Amer: >60 mL/min (ref >60)
GLUCOSE: 284 mg/dL — AB (ref 70–99)
Potassium: 3.8 mmol/L (ref 3.5–5.1)
Sodium: 137 mmol/L (ref 135–145)
TOTAL PROTEIN: 6.3 g/dL — AB (ref 6.5–8.1)

## 2018-11-15 LAB — MAGNESIUM: Magnesium: 2.2 mg/dL (ref 1.7–2.4)

## 2018-11-15 LAB — PHOSPHORUS: Phosphorus: 2.6 mg/dL (ref 2.5–4.6)

## 2018-11-15 MED ORDER — INSULIN ASPART 100 UNIT/ML ~~LOC~~ SOLN: 0.0000 [IU] | Freq: Every day | SUBCUTANEOUS | Status: DC

## 2018-11-15 MED ORDER — INSULIN ASPART 100 UNIT/ML ~~LOC~~ SOLN
0.0000 [IU] | Freq: Three times a day (TID) | SUBCUTANEOUS | Status: DC
  Administered 2018-11-16: 5 [IU] via SUBCUTANEOUS
  Administered 2018-11-16 – 2018-11-17 (×4): 3 [IU] via SUBCUTANEOUS
  Administered 2018-11-18 (×2): 2 [IU] via SUBCUTANEOUS
  Administered 2018-11-19: 3 [IU] via SUBCUTANEOUS
  Administered 2018-11-19: 2 [IU] via SUBCUTANEOUS
  Administered 2018-11-20: 3 [IU] via SUBCUTANEOUS
  Administered 2018-11-21 (×2): 2 [IU] via SUBCUTANEOUS
  Administered 2018-11-22: 3 [IU] via SUBCUTANEOUS
  Administered 2018-11-22 – 2018-11-23 (×4): 2 [IU] via SUBCUTANEOUS

## 2018-11-15 NOTE — Progress Notes (Signed)
PROGRESS NOTE    Amy Hood  ZOX:[id number] DOB: 01/12/1941 DOA: 11/12/2018 PCP: Georgann HousekeeperHusain, Karrar, MD   Brief Narrative:  HPI per Dr. Erin HearingMauricio Arrien on 11/12/18 Amy Hood is a 77 y.o. female with medical history significant of chronic arthritis, coronary artery disease, atrial fibrillation, dyslipidemia, hypertension, and type 2 diabetes mellitus.  Patient presents with severe lower back pain, sharp in nature, severe in intensity, worse with movement, no improving factors, associated ambulatory dysfunction, poor appetite, and confusion.  Patient was seen November 25 in the emergency department for the same complaint, she was diagnosed with a urinary tract infection and was prescribed cephalexin.  She did complete a course of oral antibiotics at home with no improvement of her symptoms.   For the last 10 days patient has been in bedridden due to severe back pain, had difficulty seating that has affected her p.o. Intake. Patient usually ambulates with a walker.  She takes opiates for pain control.  All the information is obtained through her daughter due to patient's language barriers.   **She speaks [place] and Daughter and Stratus Juanita Cravernterpretor Ivy [phone].  Patient states that her back pain is improved a little bit and patient daughter thinks that her mentation is improved back to baseline.  I have called IR for possible vertebroplasty given her acute back pain and they feel that she would benefit if her symptoms correlate clinically. Upon palpation of her thoracic spine she had severe pain and is not complaining of low back pain now but complaining of pain in her sides of her ribs.   Assessment & Plan:   Active Problems:   Hyperglycemia  Severe back pain complicated by metabolic encephalopathy; Encephalopathy is improved -Patient had experiencing worsening of her back pain, affecting her p.o. intake and ambulation.   -She has  failed outpatient therapy.   -Will proceed with further  imaging with spine MRI -MRI of the Spine showed acute T6 compression fracture with up to 50% height loss in the form of meter bony retropulsion resulting mild to moderate spinal stenosis and no other acute abnormality within the thoracic spine and no other significant stenosis.  There is no acute compression fracture abnormality in the lumbar spine but there is multiple chronic compression fractures with sequelae of prior vertebral augmentation of the L1, L2, L4 and L5 vertebral bodies.  There is also multilevel degenerative spondylosis with resultant moderate spinal stenosis at L1-L2, L3-L4, and L4-L5.  There is also moderate right L3, bilateral L4 and left L5 foraminal narrowing that the bulging disc and facet hypertrophy -Will consider discussing with neurosurgery about lumbar spine changes but likely can be done in the outpatient -I have consulted IR for possible vertebroplasty and we have held her Plavix and this is Day 3/5 that it is held -Continue pain control with IV morphine, neurochecks every 4 hours -Physical therapy to evaluate and Treat and recommending Home Health PT -IVF hydration is now D/C'd  Type 2 Diabetes Mellitus with uncontrolled hyperglycemia -Added Sensitive insulin sliding scale for glucose coverage and monitoring and increased to Moderate Novolog SSI AC/HS -Patient at home on metformin -IVF now D/C'd -Started on Lantus 10 units and increased dose to 12 units daily;  -CBG's ranging from 228-320 -Diabetes Education Coordinator to be consulted for furtehr evaluation  -HbA1c was 8.0  Coronary artery disease s/p Stenting -Chest pain-free; C/w aspirin and rosuvastatin. -Hold Clopidogrel for possible Intervention by IR for Vertebroplasty/Kyphoplasty  Hypertension -BP was 148/90 -Continue blood pressure control with Carvedilol, and Losartan.  Hypernatremia, improved -May have been factoring into her Encephalopathy -Na+ peaked at 150 and is now 109 -IVF now  D/C'd  Leukocytosis, improved  -Likely reactive in the setting of pain -Improving and WBC is now 8.3 -Continue monitor for signs and symptoms of infection -Repeat CBC in a.m.  Constipation -Bowel regimen started with senna docusate 1 tab p.o. twice daily, MiraLAX 17 g p.o. twice daily, and bisacodyl suppository  Obesity -Estimated body mass index is 34.92 kg/m as calculated from the following:   Height as of this encounter: 4\' 9"  (1.448 m).   Weight as of this encounter: 73.2 kg. -Weight Loss Counseling Advised  Lactic Acidosis, improved -In the setting of Recent Metformin Use and Dehydration from poor po Intake -LA was trending down and now 1.7 -Given IVF Bolus of 500 mL  -Will not repeat LA level as she is improve  DVT prophylaxis: Enoxaparin 40 mg sq q24h Code Status: FULL CODE Family Communication: Discussed with Family present at bedside Disposition Plan: Continue to Treat Pain and have IR evaluate for possible Vertebroplasty  Consultants:   IR  Procedures:  MRI   Antimicrobials:  Anti-infectives (From admission, onward)   Start     Dose/Rate Route Frequency Ordered Stop   11/12/18 1330  cefTRIAXone (ROCEPHIN) 1 g in sodium chloride 0.9 % 100 mL IVPB     1 g 200 mL/hr over 30 Minutes Intravenous  Once 11/12/18 1317 11/12/18 1525     Subjective: Seen and examined at bedside and had her daughter translate along with Stratus Dolores Patty [phone].  He said her back pain is now 2 or 3 out of 10 but still very painful complaining of pain in her side.  States that her bowel regimen has improved and she is having bowel movements and 2 yesterday.  Does not complain of any low back pain but complains of thoracic back pain and pain on the sides of her ribs bilaterally.  No chest pain, nausea or vomiting.  Objective: Vitals:   11/14/18 1326 11/14/18 2100 11/15/18 0530 11/15/18 1316  BP: (!) 151/73 138/84 (!) 129/101 (!) 148/90  Pulse: 62 74 94 61  Resp: 16 14 16 18    Temp: 97.8 F (36.6 C) 97.9 F (36.6 C) 98 F (36.7 C) 98.4 F (36.9 C)  TempSrc: Oral Oral Oral Oral  SpO2: 97% 98% 97% 93%  Weight:      Height:        Intake/Output Summary (Last 24 hours) at 11/15/2018 1753 Last data filed at 11/15/2018 1500 Gross per 24 hour  Intake 1790.39 ml  Output 1900 ml  Net -109.61 ml   Filed Weights   11/12/18 1257  Weight: 73.2 kg   Examination: Physical Exam:  Constitutional: Well-nourished, well-developed obese [place] female currently no acute distress appears calm but still complaining of pain in her back Eyes: Conjunctive normal.  Lids normal.  Sclera anicteric ENMT: External ears and nose appear normal. Neck: Appears supple with no JVD Respiratory: Clear to auscultation bilaterally no appreciable wheezing, rales, rhonchi.  Patient was not tachypneic or using any accessory muscles of Cardiovascular: Regular rate and rhythm.  No appreciable murmurs, rubs, gallops Abdomen: Soft, nontender, distended secondary body habitus.  Bowel sounds present in 4 quadrants GU: Deferred Musculoskeletal: No clubbing or cyanosis.  Has significant pain on palpation again on the thoracic spine Skin: No appreciable rashes or lesions limited skin evaluation Neurologic: Cranial nerves II through XII grossly intact no appreciable focal deficit Psychiatric: Normal judgment and insight.  Patient is awake alert and oriented x3  Data Reviewed: I have personally reviewed following labs and imaging studies  CBC: Recent Labs  Lab 11/12/18 1230 11/13/18 0435 11/14/18 0531 11/15/18 0558  WBC 13.1* 12.0* 9.9 8.3  NEUTROABS 11.3*  --  7.7 6.7  HGB 15.7* 13.0 11.9* 12.4  HCT 48.7* 41.5 37.5 38.9  MCV 82.5 82.5 81.2 81.0  PLT 463* 354 261 293   Basic Metabolic Panel: Recent Labs  Lab 11/12/18 1230 11/12/18 2308 11/13/18 0435 11/14/18 0531 11/15/18 0558  NA 147*  --  150* 140 137  K 4.5  --  3.6 3.9 3.8  CL 99  --  110 105 100  CO2 32  --  28 27 26    GLUCOSE 585* 675* 72 220* 284*  BUN 32*  --  34* 18 9  CREATININE 0.90  --  0.65 0.51 0.51  CALCIUM 10.0  --  8.7* 8.1* 8.1*  MG  --   --   --  2.2 2.2  PHOS  --   --   --  3.2 2.6   GFR: Estimated Creatinine Clearance: 47.9 mL/min (by C-G formula based on SCr of 0.51 mg/dL). Liver Function Tests: Recent Labs  Lab 11/12/18 1230 11/14/18 0531 11/15/18 0558  AST 16 20 15   ALT 16 14 16   ALKPHOS 82 56 52  BILITOT 0.9 0.7 0.8  PROT 9.3* 6.0* 6.3*  ALBUMIN 4.1 2.7* 2.8*   No results for input(s): LIPASE, AMYLASE in the last 168 hours. No results for input(s): AMMONIA in the last 168 hours. Coagulation Profile: Recent Labs  Lab 11/12/18 1230  INR 0.97   Cardiac Enzymes: Recent Labs  Lab 11/12/18 1230  CKTOTAL 34*   BNP (last 3 results) No results for input(s): PROBNP in the last 8760 hours. HbA1C: Recent Labs    11/14/18 0900  HGBA1C 8.0*   CBG: Recent Labs  Lab 11/14/18 1721 11/14/18 2106 11/15/18 0737 11/15/18 1206 11/15/18 1712  GLUCAP 273* 251* 270* 320* 228*   Lipid Profile: No results for input(s): CHOL, HDL, LDLCALC, TRIG, CHOLHDL, LDLDIRECT in the last 72 hours. Thyroid Function Tests: No results for input(s): TSH, T4TOTAL, FREET4, T3FREE, THYROIDAB in the last 72 hours. Anemia Panel: No results for input(s): VITAMINB12, FOLATE, FERRITIN, TIBC, IRON, RETICCTPCT in the last 72 hours. Sepsis Labs: Recent Labs  Lab 11/12/18 1235 11/12/18 1358 11/14/18 0900 11/14/18 1232  LATICACIDVEN 2.75* 2.51* 2.2* 1.7    Recent Results (from the past 240 hour(s))  Culture, blood (Routine x 2)     Status: None (Preliminary result)   Collection Time: 11/12/18 12:18 PM  Result Value Ref Range Status   Specimen Description   Final    BLOOD LEFT ANTECUBITAL Performed at [facility], [address], [place], [place] [address]    Special Requests   Final    BOTTLES DRAWN AEROBIC AND ANAEROBIC Blood Culture adequate volume   Culture    Final    NO GROWTH 3 DAYS Performed at [facility], [address], [place], [place] [address]    Report Status PENDING  Incomplete    RN Pressure Injury Documentation: Pressure Injury 12/31/16 Stage I -  Intact skin with non-blanchable redness of a localized area usually over a bony prominence. baseball sized area of redness (Active)  12/31/16 1702   Location: Sacrum  Location Orientation: Mid  Staging: Stage I -  Intact skin with non-blanchable redness of a localized area usually over a bony  prominence.  Wound Description (Comments): baseball sized area of redness  Present on Admission: Yes   Radiology Studies: No results found. Scheduled Meds: . aspirin  81 mg Oral Daily  . carvedilol  6.25 mg Oral BID WC  . cholecalciferol  1,000 Units Oral Daily  . enoxaparin (LOVENOX) injection  40 mg Subcutaneous Q24H  . insulin aspart  0-9 Units Subcutaneous TID WC  . insulin glargine  12 Units Subcutaneous Daily  . iron polysaccharides  150 mg Oral Daily  . polyethylene glycol  17 g Oral BID  . rosuvastatin  20 mg Oral q1800  . senna-docusate  1 tablet Oral BID   Continuous Infusions: . sodium chloride 10 mL/hr at 11/15/18 1500    LOS: 3 days   Merlene Laughter, DO [facility] PAGER is on AMION  If 7PM-7AM, please contact night-coverage [email or web address] Password [facility] 11/15/2018, 5:53 PM

## 2018-11-16 LAB — COMPREHENSIVE METABOLIC PANEL
ALT: 14 U/L (ref 0–44)
AST: 14 U/L — ABNORMAL LOW (ref 15–41)
Albumin: 2.8 g/dL — ABNORMAL LOW (ref 3.5–5.0)
Alkaline Phosphatase: 57 U/L (ref 38–126)
Anion gap: 9 (ref 5–15)
BUN: 12 mg/dL (ref 8–23)
CALCIUM: 8.4 mg/dL — AB (ref 8.9–10.3)
CO2: 26 mmol/L (ref 22–32)
Chloride: 99 mmol/L (ref 98–111)
Creatinine, Ser: 0.51 mg/dL (ref 0.44–1.00)
GFR calc Af Amer: >60 mL/min (ref >60)
Glucose, Bld: 204 mg/dL — ABNORMAL HIGH (ref 70–99)
Potassium: 3.8 mmol/L (ref 3.5–5.1)
Sodium: 134 mmol/L — ABNORMAL LOW (ref 135–145)
Total Bilirubin: 0.9 mg/dL (ref 0.3–1.2)
Total Protein: 6.1 g/dL — ABNORMAL LOW (ref 6.5–8.1)

## 2018-11-16 LAB — PROTIME-INR
INR: 0.9
PROTHROMBIN TIME: 12.1 s (ref 11.4–15.2)

## 2018-11-16 LAB — CBC WITH DIFFERENTIAL/PLATELET
ABS IMMATURE GRANULOCYTES: 0.04 10*3/uL (ref 0.00–0.07)
BASOS PCT: 0 %
Basophils Absolute: 0 10*3/uL (ref 0.0–0.1)
EOS ABS: 0.2 10*3/uL (ref 0.0–0.5)
Eosinophils Relative: 2 %
HCT: 40.2 % (ref 36.0–46.0)
Hemoglobin: 12.8 g/dL (ref 12.0–15.0)
Immature Granulocytes: 1 %
Lymphocytes Relative: 20 %
Lymphs Abs: 1.7 10*3/uL (ref 0.7–4.0)
MCH: 26.4 pg (ref 26.0–34.0)
MCHC: 31.8 g/dL (ref 30.0–36.0)
MCV: 82.9 fL (ref 80.0–100.0)
Monocytes Absolute: 0.5 10*3/uL (ref 0.1–1.0)
Monocytes Relative: 6 %
NEUTROS ABS: 5.8 10*3/uL (ref 1.7–7.7)
NEUTROS PCT: 71 %
Platelets: 264 10*3/uL (ref 150–400)
RBC: 4.85 MIL/uL (ref 3.87–5.11)
RDW: 13 % (ref 11.5–15.5)
WBC: 8.2 10*3/uL (ref 4.0–10.5)
nRBC: 0 % (ref 0.0–0.2)

## 2018-11-16 LAB — GLUCOSE, CAPILLARY
Glucose-Capillary: 218 mg/dL — ABNORMAL HIGH (ref 70–99)
Glucose-Capillary: 189 mg/dL — ABNORMAL HIGH (ref 70–99)
Glucose-Capillary: 165 mg/dL — ABNORMAL HIGH (ref 70–99)
Glucose-Capillary: 186 mg/dL — ABNORMAL HIGH (ref 70–99)

## 2018-11-16 LAB — PHOSPHORUS: Phosphorus: 3.1 mg/dL (ref 2.5–4.6)

## 2018-11-16 LAB — MAGNESIUM: Magnesium: 2.3 mg/dL (ref 1.7–2.4)

## 2018-11-16 NOTE — Care Management Important Message (Signed)
Important Message  Patient Details  Name: Amy Hood MRN: [id number] Date of Birth: [birth year]   Medicare Important Message Given:  Yes    Caren MacadamFuller, Andry Bogden 11/16/2018, 10:08 AMImportant Message  Patient Details  Name: Amy IllGiau Bloor MRN: [id number] Date of Birth: [birth year]   Medicare Important Message Given:  Yes    Caren MacadamFuller, Zyla Dascenzo 11/16/2018, 10:07 AM

## 2018-11-16 NOTE — Care Management (Signed)
HHPT recommendation made from PT eval. This CM unsure if plan for back surgery has been solidified. Will await for home health orders from MD when home health services are appropriate. Per granddaughter Amy Hood [phone] pt husband has used [facility] in the past for home health services and they would like to use them again if needed. Sandford Crazeora Samwise Eckardt RN,BSN [phone]

## 2018-11-16 NOTE — Progress Notes (Signed)
PROGRESS NOTE    Amy Hood  HYQ:[id number] DOB: [birth year] DOA: 11/12/2018 PCP: Georgann Housekeeper, MD   Brief Narrative:  HPI per Dr. Erin Hearing on 11/12/18 Alisandra Son is a 77 y.o. female with medical history significant of chronic arthritis, coronary artery disease, atrial fibrillation, dyslipidemia, hypertension, and type 2 diabetes mellitus.  Patient presents with severe lower back pain, sharp in nature, severe in intensity, worse with movement, no improving factors, associated ambulatory dysfunction, poor appetite, and confusion.  Patient was seen November 25 in the emergency department for the same complaint, she was diagnosed with a urinary tract infection and was prescribed cephalexin.  She did complete a course of oral antibiotics at home with no improvement of her symptoms.   For the last 10 days patient has been in bedridden due to severe back pain, had difficulty seating that has affected her p.o. Intake. Patient usually ambulates with a walker.  She takes opiates for pain control.  All the information is obtained through her daughter due to patient's language barriers.   **She speaks [place] and friend interpreted.  Patient states that her back pain is an 8/10 in severity.  I have called IR for possible vertebroplasty given her acute back pain and they feel that she would benefit if her symptoms correlate clinically. Upon palpation of her thoracic spine she had severe pain and is not complaining of low back pain now but complaining of pain in her sides of her ribs. IR   Assessment & Plan:   Active Problems:   Hyperglycemia  Severe back pain complicated by metabolic encephalopathy; Encephalopathy is improved -Patient had experiencing worsening of her back pain, affecting her p.o. intake and ambulation.   -She has  failed outpatient therapy.   -Will proceed with further imaging with spine MRI -MRI of the Spine showed acute T6 compression fracture with up to 50% height  loss in the form of meter bony retropulsion resulting mild to moderate spinal stenosis and no other acute abnormality within the thoracic spine and no other significant stenosis.  There is no acute compression fracture abnormality in the lumbar spine but there is multiple chronic compression fractures with sequelae of prior vertebral augmentation of the L1, L2, L4 and L5 vertebral bodies.  There is also multilevel degenerative spondylosis with resultant moderate spinal stenosis at L1-L2, L3-L4, and L4-L5.  There is also moderate right L3, bilateral L4 and left L5 foraminal narrowing that the bulging disc and facet hypertrophy -Will consider discussing with neurosurgery about lumbar spine changes but likely can be done in the outpatient -I have consulted IR for possible vertebroplasty and we have held her Plavix and this is Day 4/5 that it is held -Continue pain control with IV morphine, neurochecks every 4 hours -Physical therapy to evaluate and Treat and recommending Home Health PT -IVF hydration is now D/C'd -IR recommending if the procedure is preformed this week will need to be done at [facility] and will likely consider transfer currently.  She will need to be full Lovenox the day of the procedure.  Dr. Gavin Pound for her to do the procedure for body augmentation -IR has requested that her urine culture be repeated however she had a urinalysis done on admission which showed no evidence of any UTI and patient is not symptomatic so I do not feel strongly to repeat it.  If interventional radiology wants to repeat her urine culture I will leave it to the discretion but I do not feel like she has a  current urinary tract infection she is not symptomatic  Type 2 Diabetes Mellitus with uncontrolled hyperglycemia -Added Sensitive insulin sliding scale for glucose coverage and monitoring and increased to Moderate Novolog SSI AC/HS -Patient at home on metformin -IVF now D/C'd -Started on Lantus 10 units and  increased dose to 12 units daily;  -CBG's ranging from 189-228 -Diabetes Education Coordinator to be consulted for furtehr evaluation  -HbA1c was 8.0  Coronary artery disease s/p Stenting -Chest pain-free; C/w aspirin and rosuvastatin. -Holding Clopidogrel for possible Intervention by IR for Vertebroplasty/Kyphoplasty; [facility]  Hypertension -BP was 122/69 -Continue blood pressure control with Carvedilol, and Losartan.  Hypernatremia, improved and is slightly Hyponatremic -May have been factoring into her Encephalopathy -Na+ peaked at 150 and is now 29134 -IVF now D/C'd and will continue to Monitor   Leukocytosis, improved  -Likely reactive in the setting of pain -Improving and WBC is now 8.2 -Continue monitor for signs and symptoms of infection -Repeat CBC in a.m.  Constipation, improved -Bowel regimen started with senna docusate 1 tab p.o. twice daily, MiraLAX 17 g p.o. twice daily, and bisacodyl suppository  Obesity -Estimated body mass index is 35.54 kg/m as calculated from the following:   Height as of this encounter: 4\' 9"  (1.448 m).   Weight as of this encounter: 74.5 kg. -Weight Loss Counseling Advised  Lactic Acidosis, improved -In the setting of Recent Metformin Use and Dehydration from poor po Intake -LA was trending down and now 1.7 -Given IVF Bolus of 500 mL  -Will not repeat LA level as she is improve  DVT prophylaxis: Enoxaparin 40 mg sq q24h Code Status: FULL CODE Family Communication: Discussed with Family present at bedside and Grand-daughter over the phone Disposition Plan: Continue to Treat Pain and have IR evaluate for possible Vertebroplasty  Consultants:   IR  Procedures:  MRI   Antimicrobials:  Anti-infectives (From admission, onward)   Start     Dose/Rate Route Frequency Ordered Stop   11/12/18 1330  cefTRIAXone (ROCEPHIN) 1 g in sodium chloride 0.9 % 100 mL IVPB     1 g 200 mL/hr over 30  Minutes Intravenous  Once 11/12/18 1317 11/12/18 1525     Subjective: Seen and examined at bedside and family translated for us and patient states that her pain level is an 8 out of 10 in severity.  She states that she is having more pain since yesterday and it is not adequately controlled.  No nausea or vomiting.  Having bowel movements.  No chest pain, lightheadedness or dizziness.  No other concerns at present this time and she just wants her pain to improve.  PT work with the patient and recommending home health PT.  Family is not interested in assisted living placement currently.  Objective: Vitals:   11/15/18 1316 11/15/18 2116 11/16/18 0545 11/16/18 1350  BP: (!) 148/90 (!) 144/71 140/84 122/69  Pulse: 61 60 72 66  Resp: 18 16 16 17   Temp: 98.4 F (36.9 C) 98.5 F (36.9 C) 98.2 F (36.8 C) 97.8 F (36.6 C)  TempSrc: Oral Oral Oral Oral  SpO2: 93% 97% 96% 98%  Weight:   74.5 kg   Height:        Intake/Output Summary (Last 24 hours) at 11/16/2018 1555 Last data filed at 11/16/2018 1230 Gross per 24 hour  Intake 537.46 ml  Output 1500 ml  Net -962.54 ml   Filed Weights   11/12/18 1257 11/16/18 0545  Weight: 73.2 kg 74.5 kg   Examination: Physical Exam:  Constitutional: Well-nourished, well-developed obese.  Obese female currently no acute distress but still complaining of significant back pain and is rating it an 8 out of 10 Eyes: Conjunctive are normal.  Lids normal.  Sclera anicteric ENMT: External ears and nose appear normal. Neck: Appears supple with no JVD Respiratory: Clear to auscultation bilaterally with no appreciable wheezing, rales, rhonchi.  Patient not tachypneic using accessory muscles to breathe Cardiovascular: Regular rate rhythm.  No appreciable murmurs, rubs, gallops Abdomen:, Nontender, distended secondary body habitus.  Bowel sounds present in 4 quadrants GU: Deferred Musculoskeletal: No clubbing or cyanosis.  Had significant pain on palpation again  the thoracic spine Skin: No appreciable rashes or lesions on limited skin evaluation Neurologic: Cranial nerves II through XII grossly intact no appreciable focal deficit Psychiatric: Slightly anxious but normal judgment intact.  Patient is awake, alert and oriented x3  Data Reviewed: I have personally reviewed following labs and imaging studies  CBC: Recent Labs  Lab 11/12/18 1230 11/13/18 0435 11/14/18 0531 11/15/18 0558 11/16/18 0512  WBC 13.1* 12.0* 9.9 8.3 8.2  NEUTROABS 11.3*  --  7.7 6.7 5.8  HGB 15.7* 13.0 11.9* 12.4 12.8  HCT 48.7* 41.5 37.5 38.9 40.2  MCV 82.5 82.5 81.2 81.0 82.9  PLT 463* 354 261 293 264   Basic Metabolic Panel: Recent Labs  Lab 11/12/18 1230 11/12/18 2308 11/13/18 0435 11/14/18 0531 11/15/18 0558 11/16/18 0512  NA 147*  --  150* 140 137 134*  K 4.5  --  3.6 3.9 3.8 3.8  CL 99  --  110 105 100 99  CO2 32  --  28 27 26 26   GLUCOSE 585* 675* 72 220* 284* 204*  BUN 32*  --  34* 18 9 12   CREATININE 0.90  --  0.65 0.51 0.51 0.51  CALCIUM 10.0  --  8.7* 8.1* 8.1* 8.4*  MG  --   --   --  2.2 2.2 2.3  PHOS  --   --   --  3.2 2.6 3.1   GFR: Estimated Creatinine Clearance: 48.5 mL/min (by C-G formula based on SCr of 0.51 mg/dL). Liver Function Tests: Recent Labs  Lab 11/12/18 1230 11/14/18 0531 11/15/18 0558 11/16/18 0512  AST 16 20 15  14*  ALT 16 14 16 14   ALKPHOS 82 56 52 57  BILITOT 0.9 0.7 0.8 0.9  PROT 9.3* 6.0* 6.3* 6.1*  ALBUMIN 4.1 2.7* 2.8* 2.8*   No results for input(s): LIPASE, AMYLASE in the last 168 hours. No results for input(s): AMMONIA in the last 168 hours. Coagulation Profile: Recent Labs  Lab 11/12/18 1230 11/16/18 1519  INR 0.97 0.90   Cardiac Enzymes: Recent Labs  Lab 11/12/18 1230  CKTOTAL 34*   BNP (last 3 results) No results for input(s): PROBNP in the last 8760 hours. HbA1C: Recent Labs    11/14/18 0900  HGBA1C 8.0*   CBG: Recent Labs  Lab 11/15/18 1206 11/15/18 1712 11/15/18 2117  11/16/18 0740 11/16/18 1203  GLUCAP 320* 228* 198* 218* 189*   Lipid Profile: No results for input(s): CHOL, HDL, LDLCALC, TRIG, CHOLHDL, LDLDIRECT in the last 72 hours. Thyroid Function Tests: No results for input(s): TSH, T4TOTAL, FREET4, T3FREE, THYROIDAB in the last 72 hours. Anemia Panel: No results for input(s): VITAMINB12, FOLATE, FERRITIN, TIBC, IRON, RETICCTPCT in the last 72 hours. Sepsis Labs: Recent Labs  Lab 11/12/18 1235 11/12/18 1358 11/14/18 0900 11/14/18 1232  LATICACIDVEN 2.75* 2.51* 2.2*  1.7    Recent Results (from the past 240 hour(s))  Culture, blood (Routine x 2)     Status: None (Preliminary result)   Collection Time: 11/12/18 12:18 PM  Result Value Ref Range Status   Specimen Description   Final    BLOOD LEFT ANTECUBITAL Performed at [facility], [address] [place], [place] [address]    Special Requests   Final    BOTTLES DRAWN AEROBIC AND ANAEROBIC Blood Culture adequate volume   Culture   Final    NO GROWTH 4 DAYS Performed at [facility], [address] [place], [place] [address]    Report Status PENDING  Incomplete    RN Pressure Injury Documentation: Pressure Injury 12/31/16 Stage I -  Intact skin with non-blanchable redness of a localized area usually over a bony prominence. baseball sized area of redness (Active)  12/31/16 1702   Location: Sacrum  Location Orientation: Mid  Staging: Stage I -  Intact skin with non-blanchable redness of a localized area usually over a bony prominence.  Wound Description (Comments): baseball sized area of redness  Present on Admission: Yes   Radiology Studies: No results found. Scheduled Meds: . aspirin  81 mg Oral Daily  . carvedilol  6.25 mg Oral BID WC  . cholecalciferol  1,000 Units Oral Daily  . enoxaparin (LOVENOX) injection  40 mg Subcutaneous Q24H  . insulin aspart  0-15 Units Subcutaneous TID WC  . insulin aspart  0-5 Units Subcutaneous QHS  . insulin  glargine  12 Units Subcutaneous Daily  . iron polysaccharides  150 mg Oral Daily  . polyethylene glycol  17 g Oral BID  . rosuvastatin  20 mg Oral q1800  . senna-docusate  1 tablet Oral BID   Continuous Infusions: . sodium chloride 10 mL/hr at 11/15/18 2345    LOS: 4 days   Merlene Laughter, DO [facility] PAGER is on AMION  If 7PM-7AM, please contact night-coverage [email or web address] Password [email or web address] 11/16/2018, 3:55 PM

## 2018-11-16 NOTE — Progress Notes (Signed)
Physical Therapy Treatment Patient Details Name: Amy Hood MRN: [id number] DOB: 1941/08/06 Today's Date: 11/16/2018    History of Present Illness 77 y.o. female with medical history significant of chronic arthritis, coronary artery disease, atrial fibrillation, dyslipidemia, hypertension, and type 2 diabetes mellitus.  Patient presents with severe lower back pain, Dx of T6 compression fracture.     PT Comments    Pt with improved ambulation distance today and was highly motivate to ambulate with PT. Pt with some dyspnea noted after 360 ft ambulation, SpO2 91% on room air after ambulation and pt's daughter states she wears O2 as needed in room. D/c plan remains appropriate, PT to continue to follow acutely.    Follow Up Recommendations  Home health PT;Supervision for mobility/OOB     Equipment Recommendations  None recommended by PT    Recommendations for Other Services       Precautions / Restrictions Precautions Precautions: Fall Restrictions Weight Bearing Restrictions: No    Mobility  Bed Mobility Overal bed mobility: Needs Assistance Bed Mobility: Rolling;Sidelying to Sit;Sit to Sidelying Rolling: Min assist Sidelying to sit: Min guard     Sit to sidelying: Min assist General bed mobility comments: Pt's daughter assisted in verbal cuing of pt to participate in log rolling for spine involvement, and PT provided tactile cuing throughout. Min assist for trunk translation in rolling and sit to sidelying. Pt min guard for supine to sit for safety.  Transfers Overall transfer level: Needs assistance Equipment used: Rolling walker (2 wheeled) Transfers: Sit to/from Stand Sit to Stand: Min guard         General transfer comment: Min guard for safety.  Ambulation/Gait Ambulation/Gait assistance: Min guard Gait Distance (Feet): 480 Feet(240 ft x 2) Assistive device: Rolling walker (2 wheeled) Gait Pattern/deviations: Step-through pattern;Trunk flexed;Decreased  stride length;Shuffle Gait velocity: slightly decr   General Gait Details: Min guard to supervision for safety. Verbal cuing from daughter initially to step into RW during ambulation, after this pt required 4 tactile/demonstration cues to step into RW. After 240 ft, pt with restroom break. Pt wanted to continue ambulation. Pt with dyspnea during ambulation after 360 ft, pt motivated to continue ambulation.   Stairs             Wheelchair Mobility    Modified Rankin (Stroke Patients Only)       Balance Overall balance assessment: Modified Independent                                          Cognition Arousal/Alertness: Awake/alert Behavior During Therapy: WFL for tasks assessed/performed Overall Cognitive Status: Within Functional Limits for tasks assessed                                 General Comments: pt follows tactile cuing from PT, and verbal cuing from daughter and daughter's friend at bedside       Exercises      General Comments        Pertinent Vitals/Pain Pain Assessment: 0-10 Pain Score: 8 (via daughter's report ) Pain Location: back Pain Descriptors / Indicators: Guarding Pain Intervention(s): Limited activity within patient's tolerance;Repositioned;Monitored during session    Home Living                      Prior Function  PT Goals (current goals can now be found in the care plan section) Acute Rehab PT Goals Patient Stated Goal: walk farther PT Goal Formulation: With patient/family Time For Goal Achievement: 11/28/18 Potential to Achieve Goals: Good Progress towards PT goals: Progressing toward goals    Frequency    Min 3X/week      PT Plan Current plan remains appropriate    Co-evaluation              AM-PAC PT "6 Clicks" Mobility   Outcome Measure  Help needed turning from your back to your side while in a flat bed without using bedrails?: A Little Help needed  moving from lying on your back to sitting on the side of a flat bed without using bedrails?: A Little Help needed moving to and from a bed to a chair (including a wheelchair)?: A Little Help needed standing up from a chair using your arms (e.g., wheelchair or bedside chair)?: A Little Help needed to walk in hospital room?: A Little Help needed climbing 3-5 steps with a railing? : A Lot 6 Click Score: 17    End of Session Equipment Utilized During Treatment: Gait belt;Oxygen(Pt with SpO2 of 91% after ambulation, O2 reapplied) Activity Tolerance: Patient tolerated treatment well Patient left: in chair;with call bell/phone within reach;with family/visitor present;in bed;with bed alarm set(pt's daughter and daughter's friend at bedside) Nurse Communication: Mobility status PT Visit Diagnosis: Difficulty in walking, not elsewhere classified (R26.2);Pain Pain - part of body: (back)     Time: 9604-5409 PT Time Calculation (min) (ACUTE ONLY): 30 min  Charges:  $Gait Training: 23-37 mins                     Nicola Police, PT Acute Rehabilitation Services Pager [phone]  Office [phone]   Keyonna Comunale D Percilla Tweten 11/16/2018, 2:18 PM

## 2018-11-16 NOTE — Progress Notes (Signed)
CSW consulted for ALF placement. Spoke with pt's granddaughter Reyes Ivanhao [phone], she reports pt's is generally very independent with ambulating and that pt's husband and daughter provide pt with 24/7 care. Denies any interest in ALF. Reports open to home health if needed at DC.  Ilean SkillMeghan Gearldine Looney, MSW, LCSW Clinical Social Work 11/16/2018 [phone]

## 2018-11-16 NOTE — Progress Notes (Signed)
Referring Physician(s): Sheikh,O  Supervising Physician: Julieanne Cotton  Patient Status:  [facility] - In-pt  Chief Complaint:  Back pain,T6 fracture  Subjective: Patient previously seen by our service on 12/6 for possible T6 vertebral body augmentation.  She continues to have upper back pain/T6 paravertebral tenderness with radiation to both lateral chest regions.  She denies fever, headache, worsening dyspnea, abdominal pain, nausea, vomiting or bleeding.   Allergies: Patient has no known allergies.  Medications: Prior to Admission medications   Medication Sig Start Date End Date Taking? Authorizing Provider  aspirin 81 MG chewable tablet Chew 81 mg by mouth daily.   Yes [provider]  carvedilol (COREG) 6.25 MG tablet Take 1 tablet (6.25 mg total) by mouth 2 (two) times daily with a meal. 01/02/17  Yes Sharl Ma, Sarina Ill, MD  cephALEXin (KEFLEX) 500 MG capsule Take 1 capsule (500 mg total) by mouth 2 (two) times daily. 11/03/18  Yes Garlon Hatchet, PA-C  cholecalciferol (VITAMIN D3) 25 MCG (1000 UT) tablet Take 1,000 Units by mouth daily.   Yes [provider]  clopidogrel (PLAVIX) 75 MG tablet Take 1 tablet (75 mg total) by mouth daily. DO NOT START UNTIL FINISHED WITH BRILINTA 08/14/18 08/14/19 Yes Weaver, Scott T, PA-C  HYDROcodone-acetaminophen (NORCO/VICODIN) 5-325 MG tablet Take 1 tablet by mouth every 6 (six) hours as needed. Patient taking differently: Take 1 tablet by mouth 3 (three) times daily.  01/02/17  Yes Meredeth Ide, MD  iron polysaccharides (NIFEREX) 150 MG capsule Take 1 capsule (150 mg total) by mouth daily. 09/21/13  Yes Georgann Housekeeper, MD  losartan (COZAAR) 25 MG tablet Take 1 tablet (25 mg total) by mouth daily. 09/03/18  Yes Weaver, Scott T, PA-C  metFORMIN (GLUCOPHAGE) 500 MG tablet Take 500 mg by mouth daily with breakfast. with food 07/31/18  Yes [provider]  rosuvastatin (CRESTOR) 20 MG tablet Take 1 tablet (20 mg total) by mouth  daily at 6 PM. 01/02/17  Yes Sharl Ma, Sarina Ill, MD  acetaminophen (TYLENOL) 500 MG tablet Take 1,000 mg by mouth every 6 (six) hours as needed for mild pain.    [provider]  cholecalciferol 4000 units TABS Take 4,000 Units by mouth daily. Patient not taking: Reported on 11/12/2018 01/02/17   Meredeth Ide, MD     Vital Signs: BP 122/69 (BP Location: Right Arm)   Pulse 66   Temp 97.8 F (36.6 C) (Oral)   Resp 17   Ht 4\' 9"  (1.448 m)   Wt 164 lb 3.9 oz (74.5 kg)   SpO2 98%   BMI 35.54 kg/m   Physical Exam awake, alert.  Chest clear to auscultation bilaterally.  Heart with regular rate and rhythm.  Abdomen soft, positive bowel sounds, nontender.  No significant lower extremity edema.  Moderate paravertebral tenderness at T6 region  Imaging: Mr Thoracic Spine Wo Contrast  Result Date: 11/12/2018 CLINICAL DATA:  Initial evaluation for mid and lower back pain. Evaluate for possible compression fractures. EXAM: MRI THORACIC AND LUMBAR SPINE WITHOUT CONTRAST TECHNIQUE: Multiplanar and multiecho pulse sequences of the thoracic and lumbar spine were obtained without intravenous contrast. COMPARISON:  None available. FINDINGS: MRI THORACIC SPINE FINDINGS Alignment: Mild roto dextroscoliosis of the thoracic spine. Alignment otherwise normal with preservation of the normal thoracic kyphosis. No listhesis. Vertebrae: There is an acute compression fracture involving the T6 vertebral body. Associated height loss of up to 50% with 4 mm bony retropulsion. This is benign/mechanical in appearance. The fracture appears  largely confined to the T6 vertebral body itself, although reactive marrow edema extends into the posterior elements bilaterally. Vertebral body heights otherwise maintained without evidence for acute or chronic fracture. Chronic compression deformities with sequelae of prior vertebral augmentation noted at L1 and L2. Underlying bone marrow signal intensity within normal limits. No discrete  or worrisome osseous lesions. No other abnormal marrow edema. Cord: Signal intensity within the thoracic spinal cord is normal. Normal cord caliber and morphology. Conus medullaris terminates at approximately the L2 level. Paraspinal and other soft tissues: Mild paraspinous edema adjacent to the T6 compression fracture. Paraspinous soft tissues demonstrate no other acute finding. Atherosclerotic change noted within the aorta. Scattered T2 hyperintense renal cysts noted bilaterally, largest of which seen on the right and measures 19 mm in diameter. Remainder of the visualized visceral structures otherwise unremarkable. Visualized lungs are grossly clear. Disc levels: T1-2:  Unremarkable. T2-3: Unremarkable. T3-4:  Unremarkable. T4-5:  Unremarkable. T5-6: 4 mm bony retropulsion related to the acute T6 compression fracture. Ventral CSF largely effaced by retropulsed bone. Superimposed mild facet hypertrophy. Dorsal epidural lipomatosis. Resultant mild to moderate spinal stenosis. Foramina remain patent. T6-7:  Epidural lipomatosis with resultant mild spinal stenosis. T7-8: Mild dorsal epidural lipomatosis. No significant spinal stenosis. T8-9:  Unremarkable. T9-10: Mild bilateral facet hypertrophy.  No stenosis. T10-11:  Mild facet hypertrophy.  No stenosis. T11-12:  Mild facet hypertrophy.  No stenosis. MRI LUMBAR SPINE FINDINGS Segmentation: There appear to be 5 lumbar type vertebral bodies. However, when counting from the dens superiorly there only appear to be 11 type thoracic vertebral bodies. Lowest well-formed disc space will be labeled L5-S1. Alignment: Mild levoscoliosis. Alignment otherwise normal with preservation of the normal lumbar lordosis. No listhesis. Vertebrae: Chronic compression deformity involving the L1 vertebral body with sequelae of prior vertebral augmentation. Associated height loss of up to 50-60 % with 5 mm bony retropulsion. Additional chronic compression deformity involving the L2  vertebral body with sequelae of vertebral augmentation. Associated mild approximate 25% height loss without bony retropulsion. Chronic compression deformity at L4 with up to 40-50% height loss without bony retropulsion. Additional chronic compression deformity at L5 with sequelae of prior vertebral augmentation. Height loss measures up to approximately 40% without bony retropulsion. No acute compression fracture within the lumbar spine. Underlying bone marrow signal intensity heterogeneous without discrete or worrisome osseous lesions. No abnormal marrow edema. Conus medullaris and cauda equina: Conus extends to the L2 level. Conus and cauda equina appear normal. Paraspinal and other soft tissues: Paraspinous soft tissues demonstrate no acute finding. Chronic fatty atrophy noted within the posterior paraspinous and psoas musculature bilaterally. Atherosclerotic change noted within the aorta. Bilateral renal cyst noted. Disc levels: L1-2: 5 mm bony retropulsion related to the chronic L1 compression fracture. Retropulsed bone indents the ventral thecal sac. Mild facet hypertrophy. Resultant mild to moderate spinal stenosis. Mild bilateral L1 foraminal narrowing. L2-3: Mild disc bulge with disc desiccation. Mild bilateral facet and ligament flavum hypertrophy. No significant spinal stenosis. Mild bilateral L2 foraminal narrowing, left greater than right. L3-4: Diffuse disc bulge with disc desiccation. Disc bulging asymmetric to the right with associated right far lateral reactive endplate changes with marginal endplate osteophytic spurring. Mild to moderate facet and ligament flavum hypertrophy. Epidural lipomatosis. Resultant moderate spinal stenosis. Moderate right with mild left L3 foraminal narrowing. L4-5: Mild diffuse disc bulge with disc desiccation. Disc bulging asymmetric to the right with superimposed central and right foraminal annular fissures. Moderate facet and ligament flavum hypertrophy. Epidural  lipomatosis. Resultant  moderate spinal stenosis. Moderate bilateral L4 foraminal narrowing, right worse than left. L5-S1: Mild diffuse disc bulge with disc desiccation. Epidural lipomatosis. Mild to moderate facet hypertrophy. Compression of the distal thecal sac by the epidural fat. Mild right with moderate left L5 foraminal narrowing. IMPRESSION: MR THORACIC SPINE IMPRESSION 1. Acute T6 compression fracture with up to 50% height loss and 4 mm bony retropulsion with resultant mild to moderate spinal stenosis. 2. No other acute abnormality within the thoracic spine. No other significant stenosis. 3. Mild facet hypertrophy within the lower thoracic spine. MR LUMBAR SPINE IMPRESSION 1. No acute compression fracture or other abnormality within the lumbar spine. 2. Multiple chronic compression fractures with sequelae of prior vertebral augmentation involving the L1, L2, L4, and L5 vertebral bodies as above. 3. Multilevel degenerative spondylolysis with resultant moderate spinal stenosis at L1-2, L3-4 and L4-5. 4. Moderate right L3, bilateral L4, and left L5 foraminal narrowing related to disc bulging and facet hypertrophy. Electronically Signed   By: Rise MuBenjamin  McClintock M.D.   On: 11/12/2018 22:47   Mr Lumbar Spine Wo Contrast  Result Date: 11/12/2018 CLINICAL DATA:  Initial evaluation for mid and lower back pain. Evaluate for possible compression fractures. EXAM: MRI THORACIC AND LUMBAR SPINE WITHOUT CONTRAST TECHNIQUE: Multiplanar and multiecho pulse sequences of the thoracic and lumbar spine were obtained without intravenous contrast. COMPARISON:  None available. FINDINGS: MRI THORACIC SPINE FINDINGS Alignment: Mild roto dextroscoliosis of the thoracic spine. Alignment otherwise normal with preservation of the normal thoracic kyphosis. No listhesis. Vertebrae: There is an acute compression fracture involving the T6 vertebral body. Associated height loss of up to 50% with 4 mm bony retropulsion. This is  benign/mechanical in appearance. The fracture appears largely confined to the T6 vertebral body itself, although reactive marrow edema extends into the posterior elements bilaterally. Vertebral body heights otherwise maintained without evidence for acute or chronic fracture. Chronic compression deformities with sequelae of prior vertebral augmentation noted at L1 and L2. Underlying bone marrow signal intensity within normal limits. No discrete or worrisome osseous lesions. No other abnormal marrow edema. Cord: Signal intensity within the thoracic spinal cord is normal. Normal cord caliber and morphology. Conus medullaris terminates at approximately the L2 level. Paraspinal and other soft tissues: Mild paraspinous edema adjacent to the T6 compression fracture. Paraspinous soft tissues demonstrate no other acute finding. Atherosclerotic change noted within the aorta. Scattered T2 hyperintense renal cysts noted bilaterally, largest of which seen on the right and measures 19 mm in diameter. Remainder of the visualized visceral structures otherwise unremarkable. Visualized lungs are grossly clear. Disc levels: T1-2:  Unremarkable. T2-3: Unremarkable. T3-4:  Unremarkable. T4-5:  Unremarkable. T5-6: 4 mm bony retropulsion related to the acute T6 compression fracture. Ventral CSF largely effaced by retropulsed bone. Superimposed mild facet hypertrophy. Dorsal epidural lipomatosis. Resultant mild to moderate spinal stenosis. Foramina remain patent. T6-7:  Epidural lipomatosis with resultant mild spinal stenosis. T7-8: Mild dorsal epidural lipomatosis. No significant spinal stenosis. T8-9:  Unremarkable. T9-10: Mild bilateral facet hypertrophy.  No stenosis. T10-11:  Mild facet hypertrophy.  No stenosis. T11-12:  Mild facet hypertrophy.  No stenosis. MRI LUMBAR SPINE FINDINGS Segmentation: There appear to be 5 lumbar type vertebral bodies. However, when counting from the dens superiorly there only appear to be 11 type  thoracic vertebral bodies. Lowest well-formed disc space will be labeled L5-S1. Alignment: Mild levoscoliosis. Alignment otherwise normal with preservation of the normal lumbar lordosis. No listhesis. Vertebrae: Chronic compression deformity involving the L1 vertebral body with sequelae of  prior vertebral augmentation. Associated height loss of up to 50-60 % with 5 mm bony retropulsion. Additional chronic compression deformity involving the L2 vertebral body with sequelae of vertebral augmentation. Associated mild approximate 25% height loss without bony retropulsion. Chronic compression deformity at L4 with up to 40-50% height loss without bony retropulsion. Additional chronic compression deformity at L5 with sequelae of prior vertebral augmentation. Height loss measures up to approximately 40% without bony retropulsion. No acute compression fracture within the lumbar spine. Underlying bone marrow signal intensity heterogeneous without discrete or worrisome osseous lesions. No abnormal marrow edema. Conus medullaris and cauda equina: Conus extends to the L2 level. Conus and cauda equina appear normal. Paraspinal and other soft tissues: Paraspinous soft tissues demonstrate no acute finding. Chronic fatty atrophy noted within the posterior paraspinous and psoas musculature bilaterally. Atherosclerotic change noted within the aorta. Bilateral renal cyst noted. Disc levels: L1-2: 5 mm bony retropulsion related to the chronic L1 compression fracture. Retropulsed bone indents the ventral thecal sac. Mild facet hypertrophy. Resultant mild to moderate spinal stenosis. Mild bilateral L1 foraminal narrowing. L2-3: Mild disc bulge with disc desiccation. Mild bilateral facet and ligament flavum hypertrophy. No significant spinal stenosis. Mild bilateral L2 foraminal narrowing, left greater than right. L3-4: Diffuse disc bulge with disc desiccation. Disc bulging asymmetric to the right with associated right far lateral reactive  endplate changes with marginal endplate osteophytic spurring. Mild to moderate facet and ligament flavum hypertrophy. Epidural lipomatosis. Resultant moderate spinal stenosis. Moderate right with mild left L3 foraminal narrowing. L4-5: Mild diffuse disc bulge with disc desiccation. Disc bulging asymmetric to the right with superimposed central and right foraminal annular fissures. Moderate facet and ligament flavum hypertrophy. Epidural lipomatosis. Resultant moderate spinal stenosis. Moderate bilateral L4 foraminal narrowing, right worse than left. L5-S1: Mild diffuse disc bulge with disc desiccation. Epidural lipomatosis. Mild to moderate facet hypertrophy. Compression of the distal thecal sac by the epidural fat. Mild right with moderate left L5 foraminal narrowing. IMPRESSION: MR THORACIC SPINE IMPRESSION 1. Acute T6 compression fracture with up to 50% height loss and 4 mm bony retropulsion with resultant mild to moderate spinal stenosis. 2. No other acute abnormality within the thoracic spine. No other significant stenosis. 3. Mild facet hypertrophy within the lower thoracic spine. MR LUMBAR SPINE IMPRESSION 1. No acute compression fracture or other abnormality within the lumbar spine. 2. Multiple chronic compression fractures with sequelae of prior vertebral augmentation involving the L1, L2, L4, and L5 vertebral bodies as above. 3. Multilevel degenerative spondylolysis with resultant moderate spinal stenosis at L1-2, L3-4 and L4-5. 4. Moderate right L3, bilateral L4, and left L5 foraminal narrowing related to disc bulging and facet hypertrophy. Electronically Signed   By: Rise Mu M.D.   On: 11/12/2018 22:47    Labs:  CBC: Recent Labs    11/13/18 0435 11/14/18 0531 11/15/18 0558 11/16/18 0512  WBC 12.0* 9.9 8.3 8.2  HGB 13.0 11.9* 12.4 12.8  HCT 41.5 37.5 38.9 40.2  PLT 354 261 293 264    COAGS: Recent Labs    11/12/18 1230  INR 0.97    BMP: Recent Labs    11/13/18 0435  11/14/18 0531 11/15/18 0558 11/16/18 0512  NA 150* 140 137 134*  K 3.6 3.9 3.8 3.8  CL 110 105 100 99  CO2 28 27 26 26   GLUCOSE 72 220* 284* 204*  BUN 34* 18 9 12   CALCIUM 8.7* 8.1* 8.1* 8.4*  CREATININE 0.65 0.51 0.51 0.51  GFRNONAA >60 >60 >60 >60  GFRAA >60 >60 >60 >60    LIVER FUNCTION TESTS: Recent Labs    11/12/18 1230 11/14/18 0531 11/15/18 0558 11/16/18 0512  BILITOT 0.9 0.7 0.8 0.9  AST 16 20 15  14*  ALT 16 14 16 14   ALKPHOS 82 56 52 57  PROT 9.3* 6.0* 6.3* 6.1*  ALBUMIN 4.1 2.7* 2.8* 2.8*    Assessment and Plan: Patient with history of symptomatic acute T6 compression fracture with 50% height loss; imaging studies have been reviewed by Dr. Corliss Skains and pt appears to be candidate for T6 vertebral body augmentation.  She has been off Plavix for 4 days and follow-up urine culture is pending.  Will need to check insurance preauthorization.  Details of above procedure discussed briefly with patient and friend who is interpreter with their understanding and consent.  If procedure performed this week it will need to be done at [facility].  Consider transfer in meantime. Will cont to monitor.  Lovenox will need to be held day of procedure.  Electronically Signed: D. Jeananne Rama, PA-C 11/16/2018, 2:26 PM   I spent a total of 25 minutes at the the patient's bedside AND on the patient's hospital floor or unit, greater than 50% of which was counseling/coordinating care for possible T6 vertebral body augmentation    Patient ID: Amy Hood, female   DOB: September 14, 1941, 77 y.o.   MRN: [id number]

## 2018-11-17 ENCOUNTER — Encounter (HOSPITAL_COMMUNITY): Payer: Self-pay

## 2018-11-17 LAB — CBC WITH DIFFERENTIAL/PLATELET
Abs Immature Granulocytes: 0.04 10*3/uL (ref 0.00–0.07)
Basophils Absolute: 0 10*3/uL (ref 0.0–0.1)
Basophils Relative: 0 %
EOS PCT: 2 %
Eosinophils Absolute: 0.2 10*3/uL (ref 0.0–0.5)
HCT: 39.8 % (ref 36.0–46.0)
Hemoglobin: 12.7 g/dL (ref 12.0–15.0)
Immature Granulocytes: 0 %
Lymphocytes Relative: 15 %
Lymphs Abs: 1.3 10*3/uL (ref 0.7–4.0)
MCH: 26.1 pg (ref 26.0–34.0)
MCHC: 31.9 g/dL (ref 30.0–36.0)
MCV: 81.9 fL (ref 80.0–100.0)
Monocytes Absolute: 0.5 10*3/uL (ref 0.1–1.0)
Monocytes Relative: 6 %
Neutro Abs: 6.9 10*3/uL (ref 1.7–7.7)
Neutrophils Relative %: 77 %
Platelets: 273 10*3/uL (ref 150–400)
RBC: 4.86 MIL/uL (ref 3.87–5.11)
RDW: 12.9 % (ref 11.5–15.5)
WBC: 9 10*3/uL (ref 4.0–10.5)
nRBC: 0 % (ref 0.0–0.2)

## 2018-11-17 LAB — URINALYSIS, ROUTINE W REFLEX MICROSCOPIC
BILIRUBIN URINE: NEGATIVE
Glucose, UA: NEGATIVE mg/dL
Ketones, ur: NEGATIVE mg/dL
Leukocytes, UA: NEGATIVE
Nitrite: NEGATIVE
Protein, ur: NEGATIVE mg/dL
Specific Gravity, Urine: 1.013 (ref 1.005–1.030)
pH: 6 (ref 5.0–8.0)

## 2018-11-17 LAB — COMPREHENSIVE METABOLIC PANEL
ALK PHOS: 56 U/L (ref 38–126)
ALT: 14 U/L (ref 0–44)
AST: 14 U/L — ABNORMAL LOW (ref 15–41)
Albumin: 2.9 g/dL — ABNORMAL LOW (ref 3.5–5.0)
Anion gap: 7 (ref 5–15)
BUN: 11 mg/dL (ref 8–23)
CALCIUM: 8.5 mg/dL — AB (ref 8.9–10.3)
CO2: 27 mmol/L (ref 22–32)
Chloride: 101 mmol/L (ref 98–111)
Creatinine, Ser: 0.53 mg/dL (ref 0.44–1.00)
GFR calc Af Amer: >60 mL/min (ref >60)
GFR calc non Af Amer: >60 mL/min (ref >60)
Glucose, Bld: 185 mg/dL — ABNORMAL HIGH (ref 70–99)
Potassium: 3.6 mmol/L (ref 3.5–5.1)
Sodium: 135 mmol/L (ref 135–145)
Total Bilirubin: 0.9 mg/dL (ref 0.3–1.2)
Total Protein: 6.5 g/dL (ref 6.5–8.1)

## 2018-11-17 LAB — GLUCOSE, CAPILLARY
Glucose-Capillary: 162 mg/dL — ABNORMAL HIGH (ref 70–99)
Glucose-Capillary: 187 mg/dL — ABNORMAL HIGH (ref 70–99)
Glucose-Capillary: 92 mg/dL (ref 70–99)
Glucose-Capillary: 174 mg/dL — ABNORMAL HIGH (ref 70–99)

## 2018-11-17 LAB — CULTURE, BLOOD (ROUTINE X 2)
Culture: NO GROWTH
Special Requests: ADEQUATE

## 2018-11-17 LAB — MAGNESIUM: MAGNESIUM: 2.1 mg/dL (ref 1.7–2.4)

## 2018-11-17 LAB — PHOSPHORUS: Phosphorus: 3.1 mg/dL (ref 2.5–4.6)

## 2018-11-17 NOTE — Evaluation (Addendum)
Occupational Therapy Evaluation Patient Details Name: Amy Hood MRN: [id number] DOB: 15-Sep-1941 Today's Date: 11/17/2018    History of Present Illness 77 y.o. female with medical history significant of chronic arthritis, coronary artery disease, atrial fibrillation, dyslipidemia, hypertension, and type 2 diabetes mellitus.  Patient presents with severe lower back pain, Dx of T6 compression fracture.    Clinical Impression   Pt admitted as above currently demonstrating deficits in her ability to perform ADL and self care tasks as well as functional transfers secondary to pain/T6 compression fracture (see OT problem list below). Pt should benefit from acute OT to assist in maximizing independence with ADL and self care tasks prior to anticipated d/c to SNF Rehab. Pt daughter was present throughout session & translated. Pt benefits from verbal, visual and tactile cues with moderate encouragement to participate.    Follow Up Recommendations  SNF;Supervision/Assistance - 24 hour    Equipment Recommendations  Other (comment)(Defer to next venue)    Recommendations for Other Services       Precautions / Restrictions Precautions Precautions: Fall Restrictions Weight Bearing Restrictions: No      Mobility Bed Mobility Overal bed mobility: Needs Assistance Bed Mobility: Rolling;Sidelying to Sit;Sit to Sidelying Rolling: Min assist Sidelying to sit: Min guard     Sit to sidelying: Min assist General bed mobility comments: Pt's daughter assisted in verbal cuing of pt to participate in log rolling for spine involvement, and OT provided tactile & visual cues throughout. Min assist for trunk translation in rolling and sit to sidelying. Pt min guard for supine to sit for safety.  Transfers Overall transfer level: Needs assistance Equipment used: Rolling walker (2 wheeled) Transfers: Sit to/from [employer] Sit to Stand: Min guard Stand pivot transfers: Min guard             Balance Overall balance assessment: Modified Independent                                         ADL either performed or assessed with clinical judgement   ADL Overall ADL's : Needs assistance/impaired Eating/Feeding: Independent;Bed level   Grooming: Wash/dry hands;Wash/dry face;Set up;Sitting   Upper Body Bathing: Set up;Sitting;Minimal assistance   Lower Body Bathing: Moderate assistance;Sit to/from stand   Upper Body Dressing : Min guard;Sitting;Set up   Lower Body Dressing: Maximal assistance;Sit to/from stand   Toilet Transfer: Minimal assistance;RW;BSC;Ambulation;Cueing for safety;Cueing for sequencing Toilet Transfer Details (indicate cue type and reason): VC/TC for safety and sequencing with RW and for hand placement Toileting- Clothing Manipulation and Hygiene: Min guard;Sitting/lateral lean;Sit to/from stand;Minimal assistance       Functional mobility during ADLs: Min guard;Minimal assistance;Rolling walker;Cueing for safety;Cueing for sequencing(verbal and tactile cues for hand placement and safety/sequencing with RW.) General ADL Comments: Pt seen for OT assessment followed by ADL Retraining session with focus on bed mobility/lof roll, toileting transfers and functional mobiity in hallway. Pt benefits from moderate encouragement to participate in therapy session due to pain in back. Daughter present and translating throughout session.     Vision   Vision Assessment?: No apparent visual deficits     Perception     Praxis      Pertinent Vitals/Pain Pain Assessment: Faces Faces Pain Scale: Hurts little more Pain Location: back Pain Descriptors / Indicators: Guarding;Sore Pain Intervention(s): Limited activity within patient's tolerance;Repositioned;Monitored during session     Hand Dominance Right  Extremity/Trunk Assessment Upper Extremity Assessment Upper Extremity Assessment: Overall WFL for tasks assessed   Lower  Extremity Assessment Lower Extremity Assessment: Overall WFL for tasks assessed;Defer to PT evaluation   Cervical / Trunk Assessment Cervical / Trunk Assessment: Normal   Communication Communication Communication: Prefers language other than English(Daughter interpreted - [place] )   Cognition Arousal/Alertness: Awake/alert Behavior During Therapy: WFL for tasks assessed/performed Overall Cognitive Status: Within Functional Limits for tasks assessed                                 General Comments: pt follows tactile cuing from OT, and verbal cuing from daughter throughout session.    General Comments       Exercises     Shoulder Instructions      Home Living Family/patient expects to be discharged to:: Private residence Living Arrangements: Children;Spouse/significant other Available Help at Discharge: Family;Available 24 hours/day Type of Home: House Home Access: Ramped entrance     Home Layout: One level     Bathroom Shower/Tub: Chief Strategy OfficerTub/shower unit   Bathroom Toilet: Standard     Home Equipment: Environmental consultantWalker - 2 wheels;Cane - single point;Wheelchair - manual   Additional Comments: spouse has limited mobility, uses a WC and RW per daughter. Lives with daughter and spouse. Daughter available nearly 24*/day.      Prior Functioning/Environment Level of Independence: Independent with assistive device(s)        Comments: uses RW or cane        OT Problem List: Decreased activity tolerance;Decreased knowledge of use of DME or AE;Pain;Decreased knowledge of precautions      OT Treatment/Interventions: Self-care/ADL training;DME and/or AE instruction;Therapeutic activities;Patient/family education    OT Goals(Current goals can be found in the care plan section) Acute Rehab OT Goals Patient Stated Goal: None stated OT Goal Formulation: With patient/family Time For Goal Achievement: 12/01/18 Potential to Achieve Goals: Good  OT Frequency: Min 2X/week    Barriers to D/C:            Co-evaluation              AM-PAC OT "6 Clicks" Daily Activity     Outcome Measure Help from another person eating meals?: None Help from another person taking care of personal grooming?: A Little Help from another person toileting, which includes using toliet, bedpan, or urinal?: A Little Help from another person bathing (including washing, rinsing, drying)?: A Lot Help from another person to put on and taking off regular upper body clothing?: A Little Help from another person to put on and taking off regular lower body clothing?: A Lot 6 Click Score: 17   End of Session Equipment Utilized During Treatment: Gait belt;Rolling walker Nurse Communication: Mobility status;Other (comment)(Toileting and ADL/grooming performed)  Activity Tolerance: Patient tolerated treatment well Patient left: in bed;with call bell/phone within reach;with bed alarm set;with family/visitor present  OT Visit Diagnosis: Muscle weakness (generalized) (M62.81);Pain Pain - Right/Left: (Low back pain with functional activity) Pain - part of body: (Back)                Time: 1029-1101 OT Time Calculation (min): 32 min Charges:  OT General Charges $OT Visit: 1 Visit OT Evaluation $OT Eval Moderate Complexity: 1 Mod OT Treatments $Self Care/Home Management : 8-22 mins   Barnhill, Amy Beth Dixon, OTR/L 11/17/2018, 12:05 PM

## 2018-11-17 NOTE — Progress Notes (Signed)
PROGRESS NOTE    Amy Hood  ZOX:[id number] DOB: [birth year] DOA: 11/12/2018 PCP: Georgann Housekeeper, MD   Brief Narrative:  HPI per Dr. Erin Hearing on 11/12/18 Amy Hood is a 77 y.o. female with medical history significant of chronic arthritis, coronary artery disease, atrial fibrillation, dyslipidemia, hypertension, and type 2 diabetes mellitus.  Amy Hood presents with severe lower back pain, sharp in nature, severe in intensity, worse with movement, no improving factors, associated ambulatory dysfunction, poor appetite, and confusion.  Amy Hood was seen November 25 in the emergency department for the same complaint, she was diagnosed with a urinary tract infection and was prescribed cephalexin.  She did complete a course of oral antibiotics at home with no improvement of her symptoms.   For the last 10 days Amy Hood has been in bedridden due to severe back pain, had difficulty seating that has affected her p.o. Intake. Amy Hood usually ambulates with a walker.  She takes opiates for pain control.  All the information is obtained through her daughter due to Amy Hood's language barriers.   **She speaks [place] and friend interpreted again along with our Armed forces technical officer.  Amy Hood states again that her back pain is an 8/10 in severity and worse with ambulating and working with Therapy.  I have called IR for possible vertebroplasty given her acute back pain and they feel that she would benefit if her symptoms correlate clinically. Upon palpation of her thoracic spine she had severe pain and is not complaining of low back pain now but complaining of pain in her sides of her ribs. IR wanting Amy Hood to undergo repeat U/A and Urine Cx which they have ordered.   Assessment & Plan:   Active Problems:   Hyperglycemia  Severe back pain complicated by metabolic encephalopathy; Encephalopathy is improved -Amy Hood had experiencing worsening of her back pain, affecting her p.o. intake and ambulation.    -She has  failed outpatient therapy.   -Will proceed with further imaging with spine MRI -MRI of the Spine showed acute T6 compression fracture with up to 50% height loss in the form of meter bony retropulsion resulting mild to moderate spinal stenosis and no other acute abnormality within the thoracic spine and no other significant stenosis.  There is no acute compression fracture abnormality in the lumbar spine but there is multiple chronic compression fractures with sequelae of prior vertebral augmentation of the L1, L2, L4 and L5 vertebral bodies.  There is also multilevel degenerative spondylosis with resultant moderate spinal stenosis at L1-L2, L3-L4, and L4-L5.  There is also moderate right L3, bilateral L4 and left L5 foraminal narrowing that the bulging disc and facet hypertrophy -Will consider discussing with neurosurgery about lumbar spine changes but likely can be done in the outpatient -I have consulted IR for possible vertebroplasty and we have held her Plavix and this is Day 5/5 that it is held -Continue pain control with IV morphine, neurochecks every 4 hours -Physical therapy to evaluate and Treat and recommending Home Health PT but Amy Hood occupational therapy is recommending skilled nursing facility -IVF hydration is now D/C'd -IR recommending if the procedure is preformed this week will need to be done at [facility] and will likely consider transfer currently.  She will need to be full Lovenox the day of the procedure.  Dr. Gavin Pound for her to do the procedure for body augmentation -IR has requested that her urine culture be repeated however she had a urinalysis done on admission which showed no evidence of any UTI and Amy Hood is  not symptomatic so I do not feel strongly to repeat it.  If interventional radiology wants to repeat her urinalysis and urine culture I will leave it to the discretion but I do not feel like she has a current urinary tract infection she is not symptomatic.  Per  nursing they have ordered a repeat UA and urinalysis -Amy Hood still awaiting transfer to [facility] for further intervention  Type 2 Diabetes Mellitus with uncontrolled hyperglycemia -Added Sensitive insulin sliding scale for glucose coverage and monitoring and increased to Moderate Novolog SSI AC/HS -Amy Hood at home on metformin -IVF now D/C'd -Started on Lantus 10 units and increased dose to 12 units daily;  -CBG's ranging from 162-218 -Diabetes Education Coordinator to be consulted for furtehr evaluation  -HbA1c was 8.0  Coronary artery disease s/p Stenting -Chest pain-free; C/w aspirin and rosuvastatin. -Holding Clopidogrel for possible Intervention by IR for Vertebroplasty/Kyphoplasty; will need to be transferred to [facility] for procedure for vertebroplasty  Hypertension -BP was 142/68 this morning -Continue blood pressure control with Carvedilol, and Losartan.  Hypernatremia, improved and is slightly Hyponatremic -May have been factoring into her Encephalopathy -Na+ peaked at 150 and is now 55 -IVF now D/C'd and will continue to Monitor   Leukocytosis, improved  -Likely reactive in the setting of pain -Improving and WBC is now 9.0 -Continue monitor for signs and symptoms of infection -Repeat CBC in a.m.  Constipation, improved -Bowel regimen started with senna docusate 1 tab p.o. twice daily, MiraLAX 17 g p.o. twice daily, and bisacodyl suppository  Obesity -Estimated body mass index is 35.54 kg/m as calculated from the following:   Height as of this encounter: 4\' 9"  (1.448 m).   Weight as of this encounter: 74.5 kg. -Weight Loss Counseling Advised  Lactic Acidosis, improved -In the setting of Recent Metformin Use and Dehydration from poor po Intake -LA was trending down and now 1.7 -Given IVF Bolus of 500 mL  -Will not repeat LA level as she is improve  DVT prophylaxis: Enoxaparin 40 mg sq q24h Code Status: FULL CODE Family Communication: Discussed with  Family present at bedside and Grand-daughter over the phone Disposition Plan: Continue to Treat Pain and have IR evaluate for possible Vertebroplasty as Amy Hood is being transferred to [facility]  Consultants:   IR  Procedures:  MRI   Antimicrobials:  Anti-infectives (From admission, onward)   Start     Dose/Rate Route Frequency Ordered Stop   11/12/18 1330  cefTRIAXone (ROCEPHIN) 1 g in sodium chloride 0.9 % 100 mL IVPB     1 g 200 mL/hr over 30 Minutes Intravenous  Once 11/12/18 1317 11/12/18 1525     Subjective: Seen and examined at bedside and family translated along with the in-house pharmacist as I am unable to reach the status video interpreter.  Amy Hood states that she is still having significant pain in 7-8 out of 10 today.  States it is worse with ambulation with therapy.  Wanting to know when she is going to be transferred to [facility] today.  Denies any lightheadedness or dizziness but still has significant amount of pain.  No nausea or vomiting.  Objective: Vitals:   11/16/18 0545 11/16/18 1350 11/16/18 2104 11/17/18 0639  BP: 140/84 122/69 113/61 126/63  Pulse: 72 66 62 76  Resp: 16 17 16 16   Temp: 98.2 F (36.8 C) 97.8 F (36.6 C) 98.3 F (36.8 C) 98.1 F (36.7 C)  TempSrc: Oral Oral Axillary Oral  SpO2: 96% 98% 99% 93%  Weight: 74.5 kg     Height:        Intake/Output Summary (Last 24 hours) at 11/17/2018 1222 Last data filed at 11/17/2018 0702 Gross per 24 hour  Intake 300 ml  Output 1300 ml  Net -1000 ml   Filed Weights   11/12/18 1257 11/16/18 0545  Weight: 73.2 kg 74.5 kg   Examination: Physical Exam:  Constitutional: Well-nourished, well-developed obese [place] female currently no acute distress laying in bed but still complains of significant back pain and states an 8 out of 10 again and states it was acutely worsened by ambulating with therapy Eyes: Conjunctive are normal.  Lids are normal.  Sclera anicteric ENMT: External ears and  nose appear normal Neck: Appears supple no JVD Respiratory: Diminished to auscultation bilaterally no appreciable wheezing, rales, rhonchi.  Amy Hood not tachypneic wheezing excess muscle breathe Cardiovascular: Regular rate and rhythm.  No appreciable murmurs, rubs or gallops Abdomen: Nontender, soft, slightly distended secondary body habitus.  Bowel sounds present in 400 GU: Deferred Musculoskeletal: No contractures or cyanosis.  Skin: Skin is warm and dry no appreciable rashes or lesions limited skin evaluation Neurologic: Cranial nerves II through XII grossly intact no appreciable deficits Psychiatric: Not as anxious but had a normal judgment and insight.  Amy Hood was sleeping when I walked in but woke up from her sleep and she is awake, alert and oriented x3  Data Reviewed: I have personally reviewed following labs and imaging studies  CBC: Recent Labs  Lab 11/12/18 1230 11/13/18 0435 11/14/18 0531 11/15/18 0558 11/16/18 0512 11/17/18 0403  WBC 13.1* 12.0* 9.9 8.3 8.2 9.0  NEUTROABS 11.3*  --  7.7 6.7 5.8 6.9  HGB 15.7* 13.0 11.9* 12.4 12.8 12.7  HCT 48.7* 41.5 37.5 38.9 40.2 39.8  MCV 82.5 82.5 81.2 81.0 82.9 81.9  PLT 463* 354 261 293 264 273   Basic Metabolic Panel: Recent Labs  Lab 11/13/18 0435 11/14/18 0531 11/15/18 0558 11/16/18 0512 11/17/18 0403  NA 150* 140 137 134* 135  K 3.6 3.9 3.8 3.8 3.6  CL 110 105 100 99 101  CO2 28 27 26 26 27   GLUCOSE 72 220* 284* 204* 185*  BUN 34* 18 9 12 11   CREATININE 0.65 0.51 0.51 0.51 0.53  CALCIUM 8.7* 8.1* 8.1* 8.4* 8.5*  MG  --  2.2 2.2 2.3 2.1  PHOS  --  3.2 2.6 3.1 3.1   GFR: Estimated Creatinine Clearance: 48.5 mL/min (by C-G formula based on SCr of 0.53 mg/dL). Liver Function Tests: Recent Labs  Lab 11/12/18 1230 11/14/18 0531 11/15/18 0558 11/16/18 0512 11/17/18 0403  AST 16 20 15  14* 14*  ALT 16 14 16 14 14   ALKPHOS 82 56 52 57 56  BILITOT 0.9 0.7 0.8 0.9 0.9  PROT 9.3* 6.0* 6.3* 6.1* 6.5  ALBUMIN  4.1 2.7* 2.8* 2.8* 2.9*   No results for input(s): LIPASE, AMYLASE in the last 168 hours. No results for input(s): AMMONIA in the last 168 hours. Coagulation Profile: Recent Labs  Lab 11/12/18 1230 11/16/18 1519  INR 0.97 0.90   Cardiac Enzymes: Recent Labs  Lab 11/12/18 1230  CKTOTAL 34*   BNP (last 3 results) No results for input(s): PROBNP in the last 8760 hours. HbA1C: No results for input(s): HGBA1C in the last 72 hours. CBG: Recent Labs  Lab 11/16/18 1203 11/16/18 1717 11/16/18 2006 11/17/18 0740 11/17/18 1136  GLUCAP 189* 165* 186* 162* 187*   Lipid Profile: No results for input(s): CHOL, HDL, LDLCALC, TRIG,  CHOLHDL, LDLDIRECT in the last 72 hours. Thyroid Function Tests: No results for input(s): TSH, T4TOTAL, FREET4, T3FREE, THYROIDAB in the last 72 hours. Anemia Panel: No results for input(s): VITAMINB12, FOLATE, FERRITIN, TIBC, IRON, RETICCTPCT in the last 72 hours. Sepsis Labs: Recent Labs  Lab 11/12/18 1235 11/12/18 1358 11/14/18 0900 11/14/18 1232  LATICACIDVEN 2.75* 2.51* 2.2* 1.7    Recent Results (from the past 240 hour(s))  Culture, blood (Routine x 2)     Status: None (Preliminary result)   Collection Time: 11/12/18 12:18 PM  Result Value Ref Range Status   Specimen Description   Final    BLOOD LEFT ANTECUBITAL Performed at [facility], [address], [place], [place] [address]    Special Requests   Final    BOTTLES DRAWN AEROBIC AND ANAEROBIC Blood Culture adequate volume   Culture   Final    NO GROWTH 4 DAYS Performed at [facility], [address], [place], [place] [address]    Report Status PENDING  Incomplete    RN Pressure Injury Documentation: Pressure Injury 12/31/16 Stage I -  Intact skin with non-blanchable redness of a localized area usually over a bony prominence. baseball sized area of redness (Active)  12/31/16 1702   Location: Sacrum  Location Orientation: Mid  Staging: Stage I -  Intact  skin with non-blanchable redness of a localized area usually over a bony prominence.  Wound Description (Comments): baseball sized area of redness  Present on Admission: Yes   Radiology Studies: No results found. Scheduled Meds: . aspirin  81 mg Oral Daily  . carvedilol  6.25 mg Oral BID WC  . cholecalciferol  1,000 Units Oral Daily  . enoxaparin (LOVENOX) injection  40 mg Subcutaneous Q24H  . insulin aspart  0-15 Units Subcutaneous TID WC  . insulin aspart  0-5 Units Subcutaneous QHS  . insulin glargine  12 Units Subcutaneous Daily  . iron polysaccharides  150 mg Oral Daily  . polyethylene glycol  17 g Oral BID  . rosuvastatin  20 mg Oral q1800  . senna-docusate  1 tablet Oral BID   Continuous Infusions: . sodium chloride 10 mL/hr at 11/15/18 2345    LOS: 5 days   Merlene Laughter, DO [facility] PAGER is on AMION  If 7PM-7AM, please contact night-coverage [email or web address] Password [email or web address] 11/17/2018, 12:22 PM

## 2018-11-18 DIAGNOSIS — E872 Acidosis, unspecified: Secondary | ICD-10-CM

## 2018-11-18 DIAGNOSIS — S22050A Wedge compression fracture of T5-T6 vertebra, initial encounter for closed fracture: Secondary | ICD-10-CM | POA: Diagnosis present

## 2018-11-18 DIAGNOSIS — I2510 Atherosclerotic heart disease of native coronary artery without angina pectoris: Secondary | ICD-10-CM

## 2018-11-18 DIAGNOSIS — Z9861 Coronary angioplasty status: Secondary | ICD-10-CM

## 2018-11-18 DIAGNOSIS — S22050G Wedge compression fracture of T5-T6 vertebra, subsequent encounter for fracture with delayed healing: Secondary | ICD-10-CM

## 2018-11-18 DIAGNOSIS — E119 Type 2 diabetes mellitus without complications: Secondary | ICD-10-CM

## 2018-11-18 LAB — GLUCOSE, CAPILLARY
Glucose-Capillary: 129 mg/dL — ABNORMAL HIGH (ref 70–99)
Glucose-Capillary: 124 mg/dL — ABNORMAL HIGH (ref 70–99)
Glucose-Capillary: 82 mg/dL (ref 70–99)
Glucose-Capillary: 104 mg/dL — ABNORMAL HIGH (ref 70–99)

## 2018-11-18 LAB — CBC WITH DIFFERENTIAL/PLATELET
Abs Immature Granulocytes: 0.04 10*3/uL (ref 0.00–0.07)
Basophils Absolute: 0 10*3/uL (ref 0.0–0.1)
Basophils Relative: 0 %
Eosinophils Absolute: 0.1 10*3/uL (ref 0.0–0.5)
Eosinophils Relative: 1 %
HCT: 38.1 % (ref 36.0–46.0)
Hemoglobin: 12.4 g/dL (ref 12.0–15.0)
Immature Granulocytes: 1 %
Lymphocytes Relative: 18 %
Lymphs Abs: 1.5 10*3/uL (ref 0.7–4.0)
MCH: 26.6 pg (ref 26.0–34.0)
MCHC: 32.5 g/dL (ref 30.0–36.0)
MCV: 81.6 fL (ref 80.0–100.0)
MONOS PCT: 8 %
Monocytes Absolute: 0.6 10*3/uL (ref 0.1–1.0)
NEUTROS ABS: 6 10*3/uL (ref 1.7–7.7)
Neutrophils Relative %: 72 %
Platelets: 268 10*3/uL (ref 150–400)
RBC: 4.67 MIL/uL (ref 3.87–5.11)
RDW: 13 % (ref 11.5–15.5)
WBC: 8.3 10*3/uL (ref 4.0–10.5)
nRBC: 0 % (ref 0.0–0.2)

## 2018-11-18 LAB — COMPREHENSIVE METABOLIC PANEL
ALT: 13 U/L (ref 0–44)
AST: 14 U/L — ABNORMAL LOW (ref 15–41)
Albumin: 2.8 g/dL — ABNORMAL LOW (ref 3.5–5.0)
Alkaline Phosphatase: 52 U/L (ref 38–126)
Anion gap: 10 (ref 5–15)
BUN: 10 mg/dL (ref 8–23)
CO2: 24 mmol/L (ref 22–32)
Calcium: 8.3 mg/dL — ABNORMAL LOW (ref 8.9–10.3)
Chloride: 103 mmol/L (ref 98–111)
Creatinine, Ser: 0.38 mg/dL — ABNORMAL LOW (ref 0.44–1.00)
GFR calc Af Amer: >60 mL/min (ref >60)
GFR calc non Af Amer: >60 mL/min (ref >60)
GLUCOSE: 167 mg/dL — AB (ref 70–99)
Potassium: 3.5 mmol/L (ref 3.5–5.1)
Sodium: 137 mmol/L (ref 135–145)
Total Bilirubin: 0.7 mg/dL (ref 0.3–1.2)
Total Protein: 6.3 g/dL — ABNORMAL LOW (ref 6.5–8.1)

## 2018-11-18 LAB — URINE CULTURE: Culture: NO GROWTH

## 2018-11-18 LAB — MAGNESIUM: Magnesium: 2.2 mg/dL (ref 1.7–2.4)

## 2018-11-18 LAB — PHOSPHORUS: Phosphorus: 2.6 mg/dL (ref 2.5–4.6)

## 2018-11-18 MED ORDER — OXYCODONE HCL 5 MG PO TABS
5.0000 mg | ORAL_TABLET | ORAL | Status: DC | PRN
  Administered 2018-11-18 (×2): 5 mg via ORAL
  Filled 2018-11-18 (×2): qty 1

## 2018-11-18 MED ORDER — CEFAZOLIN SODIUM-DEXTROSE 2-4 GM/100ML-% IV SOLN
2.0000 g | INTRAVENOUS | Status: AC
  Administered 2018-11-20: 2 g via INTRAVENOUS
  Filled 2018-11-18: qty 100

## 2018-11-18 MED ORDER — ACETAMINOPHEN 500 MG PO TABS
500.0000 mg | ORAL_TABLET | Freq: Three times a day (TID) | ORAL | Status: DC
  Administered 2018-11-18 – 2018-11-23 (×16): 500 mg via ORAL
  Filled 2018-11-18 (×17): qty 1

## 2018-11-18 NOTE — Progress Notes (Signed)
PT Cancellation Note  Patient Details Name: Amy Hood MRN: [id number] DOB: 04/26/1941   Cancelled Treatment:    Reason Eval/Treat Not Completed: Other (comment) MD was about to enter room with interpreter.  Will hold off.  Per chart, [facility] and vertebro[facility].  Will continue to monitor and will check back as able.   Enzo MontgomeryKaren L Sherika Kubicki 11/18/2018, 1:09 PM

## 2018-11-18 NOTE — Progress Notes (Signed)
PROGRESS NOTE    Amy Hood  ZOX:[id number] DOB: September 05, 1941 DOA: 11/12/2018 PCP: Georgann Housekeeper, MD    Brief Narrative:  HPI per Dr. Erin Hearing on 11/12/18 Amy Hood a 77 y.o.femalewith medical history significant ofchronic arthritis, coronary artery disease, atrial fibrillation, dyslipidemia, hypertension,andtype 2 diabetes mellitus. Patient presents with severe lower back pain, sharp in nature, severe in intensity, worse with movement, no improving factors,associated ambulatory dysfunction,poor appetite,and confusion. Patient was seen November 25 in the emergency department for the same complaint, she was diagnosed with a urinary tract infection and was prescribed cephalexin. She did complete acourse of oral antibiotics at home with no improvement of her symptoms.   For the last 10 days patient has been in bedridden due to severe back pain, had difficulty seatingthat has affected her p.o. Intake. Patient usually ambulates with a walker.She takes opiates for pain control.  All the information is obtained through her daughter due to patient's language barriers.  **She speaks [place] and friend interpreted again along with our Armed forces technical officer.  Patient states again that her back pain is an 8/10 in severity and worse with ambulating and working with Therapy.  I have called IR for possible vertebroplasty given her acute back pain and they feel that she would benefit if her symptoms correlate clinically. Upon palpation of her thoracic spine she had severe pain and is not complaining of low back pain now but complaining of pain in her sides of her ribs. IR wanting patient to undergo repeat U/A and Urine Cx which they have ordered.    Assessment & Plan:   Principal Problem:   Compression fracture of T6 vertebra (HCC) Active Problems:   PAF (paroxysmal atrial fibrillation) (HCC)   Type 2 diabetes mellitus without complication (HCC)   Hyperglycemia   Lactic acid  acidosis   CAD S/P percutaneous coronary angioplasty  #1 severe back pain secondary to T6 compression fracture complicated by metabolic encephalopathy Metabolic encephalopathy improved. Patient with complaints of worsening back pain in the upper thoracic area with tenderness to palpation with radiation bilaterally.  Patient noted to have failed outpatient treatment. -MRI of the T-spine done showed acute T6 compression fracture with 50% height loss with bony retropulsion resulting in mild to moderate spinal stenosis no other acute abnormalities.  Chronic compression fractures noted in the L-spine with prior augmentation of L1, L2, L4 and L5 vertebral bodies.  Multilevel degenerative spondylosis with resultant moderate spinal stenosis at L1-L2, L3-L4, L4-L5.  Moderate right L3, bilateral L4 and left L5 foraminal narrowing with bulging disc and facet hypertrophy. -IR consulted for vertebroplasty.  Plavix has been on hold day #6. -Patient with ongoing pain. -Patient was to be transferred to [facility] for vertebroplasty.  Per IR, PA patient able to have vertebroplasty at [facility] on 11/20/2018 and as such we will cancel transfer. -Discontinue Vicodin and placed on oxycodone every 4 hours as needed.  Continue IV morphine as needed.  Placed on scheduled Tylenol 500 mg 3 times daily. -PT/OT. -Urinalysis and urine cultures negative.  Patient asymptomatic.  2.  Type 2 diabetes mellitus with uncontrolled hyperglycemia Hemoglobin A1c 8.0.  CBG was 129 this morning.  Continue to hold oral hypoglycemic agents.  Continue Lantus, SSI.  3.  Coronary artery disease status post stenting Currently asymptomatic.  Chest pain-free.  Continue aspirin and statin.  Plavix on hold for probable vertebroplasty/kyphoplasty.  IR to advise when Plavix may be resumed.  4.  Hypertension Be stable on current regimen of Coreg.  5.  Hypernatremia Improved.  Follow.  D5 has been discontinued.  6.  Constipation Improved.   Patient having bowel movements.  Continue current bowel regimen of MiraLAX and Senokot.  7.  Leukocytosis Likely reactive.  Resolved.  8.  Lactic acidosis Improved.  Patient noted to be on metformin in the presence of dehydration and poor oral intake.  Follow.    DVT prophylaxis: Lovenox Code Status: Full Family Communication: Updated patient via interpreter and grandson at bedside. Disposition Plan: Pending IR procedure.  Likely skilled nursing facility versus home with home health.   Consultants:   Interventional radiology  Procedures:   Chest x-ray 11/12/2018  MRI T-spine L-spine 11/12/2018  Antimicrobials:   None   Subjective: Patient with complaints of upper back pain with some radiation bilaterally.  Denies any nausea or vomiting.  Having bowel movement this morning.  No chest pain.  No shortness of breath.  Objective: Vitals:   11/17/18 1308 11/17/18 2215 11/18/18 0546 11/18/18 1503  BP: (!) 142/68 133/60 120/64 138/60  Pulse: 68 (!) 37 63 (!) 58  Resp: 16 16 16 16   Temp: 98.6 F (37 C) 98.6 F (37 C) 98.6 F (37 C) 98 F (36.7 C)  TempSrc: Oral Oral Oral Oral  SpO2: 95% 96% 95% 94%  Weight:      Height:        Intake/Output Summary (Last 24 hours) at 11/18/2018 1649 Last data filed at 11/18/2018 1503 Gross per 24 hour  Intake 720 ml  Output 1225 ml  Net -505 ml   Filed Weights   11/12/18 1257 11/16/18 0545  Weight: 73.2 kg 74.5 kg    Examination:  General exam: Appears calm and comfortable  Respiratory system: Clear to auscultation. Respiratory effort normal. Cardiovascular system: S1 & S2 heard, RRR. No JVD, murmurs, rubs, gallops or clicks. No pedal edema. Gastrointestinal system: Abdomen is nondistended, soft and nontender. No organomegaly or masses felt. Normal bowel sounds heard. Central nervous system: Alert and oriented. No focal neurological deficits. Extremities: Symmetric 5 x 5 power. Skin: No rashes, lesions or  ulcers Psychiatry: Judgement and insight appear normal. Mood & affect appropriate.     Data Reviewed: I have personally reviewed following labs and imaging studies  CBC: Recent Labs  Lab 11/14/18 0531 11/15/18 0558 11/16/18 0512 11/17/18 0403 11/18/18 0357  WBC 9.9 8.3 8.2 9.0 8.3  NEUTROABS 7.7 6.7 5.8 6.9 6.0  HGB 11.9* 12.4 12.8 12.7 12.4  HCT 37.5 38.9 40.2 39.8 38.1  MCV 81.2 81.0 82.9 81.9 81.6  PLT 261 293 264 273 268   Basic Metabolic Panel: Recent Labs  Lab 11/14/18 0531 11/15/18 0558 11/16/18 0512 11/17/18 0403 11/18/18 0357  NA 140 137 134* 135 137  K 3.9 3.8 3.8 3.6 3.5  CL 105 100 99 101 103  CO2 27 26 26 27 24   GLUCOSE 220* 284* 204* 185* 167*  BUN 18 9 12 11 10   CREATININE 0.51 0.51 0.51 0.53 0.38*  CALCIUM 8.1* 8.1* 8.4* 8.5* 8.3*  MG 2.2 2.2 2.3 2.1 2.2  PHOS 3.2 2.6 3.1 3.1 2.6   GFR: Estimated Creatinine Clearance: 48.5 mL/min (A) (by C-G formula based on SCr of 0.38 mg/dL (L)). Liver Function Tests: Recent Labs  Lab 11/14/18 0531 11/15/18 0558 11/16/18 0512 11/17/18 0403 11/18/18 0357  AST 20 15 14* 14* 14*  ALT 14 16 14 14 13   ALKPHOS 56 52 57 56 52  BILITOT 0.7 0.8 0.9 0.9 0.7  PROT 6.0* 6.3* 6.1* 6.5 6.3*  ALBUMIN 2.7* 2.8* 2.8* 2.9* 2.8*   No results for input(s): LIPASE, AMYLASE in the last 168 hours. No results for input(s): AMMONIA in the last 168 hours. Coagulation Profile: Recent Labs  Lab 11/12/18 1230 11/16/18 1519  INR 0.97 0.90   Cardiac Enzymes: Recent Labs  Lab 11/12/18 1230  CKTOTAL 34*   BNP (last 3 results) No results for input(s): PROBNP in the last 8760 hours. HbA1C: No results for input(s): HGBA1C in the last 72 hours. CBG: Recent Labs  Lab 11/17/18 1136 11/17/18 1647 11/17/18 2218 11/18/18 0742 11/18/18 1151  GLUCAP 187* 92 174* 129* 124*   Lipid Profile: No results for input(s): CHOL, HDL, LDLCALC, TRIG, CHOLHDL, LDLDIRECT in the last 72 hours. Thyroid Function Tests: No results for  input(s): TSH, T4TOTAL, FREET4, T3FREE, THYROIDAB in the last 72 hours. Anemia Panel: No results for input(s): VITAMINB12, FOLATE, FERRITIN, TIBC, IRON, RETICCTPCT in the last 72 hours. Sepsis Labs: Recent Labs  Lab 11/12/18 1235 11/12/18 1358 11/14/18 0900 11/14/18 1232  LATICACIDVEN 2.75* 2.51* 2.2* 1.7    Recent Results (from the past 240 hour(s))  Culture, blood (Routine x 2)     Status: None   Collection Time: 11/12/18 12:18 PM  Result Value Ref Range Status   Specimen Description   Final    BLOOD LEFT ANTECUBITAL Performed at [facility], [address] [place], [place] [address]    Special Requests   Final    BOTTLES DRAWN AEROBIC AND ANAEROBIC Blood Culture adequate volume   Culture   Final    NO GROWTH 5 DAYS Performed at [facility], [address] [place], [place] [address]    Report Status 11/17/2018 FINAL  Final  Urine Culture     Status: None   Collection Time: 11/16/18  1:25 PM  Result Value Ref Range Status   Specimen Description   Final    URINE, CLEAN CATCH Performed at [facility], [address] [place], [place] [address]    Special Requests   Final    NONE Performed at [facility], [address] [place], [place] [address]    Culture   Final    NO GROWTH Performed at [facility], [address] [place], [place] [address]    Report Status 11/18/2018 FINAL  Final         Radiology Studies: No results found.      Scheduled Meds: . acetaminophen  500 mg Oral TID  . aspirin  81 mg Oral Daily  . carvedilol  6.25 mg Oral BID WC  . cholecalciferol  1,000 Units Oral Daily  . enoxaparin (LOVENOX) injection  40 mg Subcutaneous Q24H  . insulin aspart  0-15 Units Subcutaneous TID WC  . insulin aspart  0-5 Units Subcutaneous QHS  . insulin glargine  12 Units Subcutaneous Daily  . iron polysaccharides  150 mg Oral Daily  . polyethylene glycol  17 g Oral BID  .  rosuvastatin  20 mg Oral q1800  . senna-docusate  1 tablet Oral BID   Continuous Infusions: . sodium chloride 10 mL/hr at 11/15/18 2345  . [START ON 11/20/2018]  ceFAZolin (ANCEF) IV       LOS: 6 days    Time spent: 40 mins    Ramiro Harvestaniel Khyan Oats, MD [facility] Pager [phone]  If 7PM-7AM, please contact night-coverage [email or web address] Password [email or web address] 11/18/2018, 4:49 PM

## 2018-11-18 NOTE — Progress Notes (Signed)
Patient ID: Amy Hood, female   DOB: 23-May-1941, 77 y.o.   MRN: [id number]    Referring Physician(s): [place]  Supervising Physician: Jacqulynn Cadet  Patient Status:  [facility] - In-pt  Chief Complaint: Back pain, T6 fracture   Subjective: Patient continues to have upper to mid back pain.  Otherwise no acute changes.  Past Medical History:  Diagnosis Date  . Anemia   . Arthritis   . CAD (coronary artery disease) 12/28/2016   S/p NSTEMI 1/18:  LHC - oLAD, mLAD 80, pLCx 75, mRCA 25 >> PCI:  3 x 24 mm Synergy DES to mid LAD; 3.5 x 28 mm Synergy DES to proximal LCx // Echo 1/18:  Mild LVH, EF 55-60, apical inf and apical HK, Gr 1 DD, AV mean 13, MAC, trivial MR, reduced RVSF, trivial TR, PASP 35, no eff  . Cataracts, bilateral   . Essential hypertension   . Generalized weakness   . Hard of hearing   . History of atrial fibrillation    post MI - likely related to acute medical illness therefore anticoag not started  . Hyperlipidemia   . Hyponatremia   . Mild aortic stenosis 12/30/2016   Echo 1/18:  Mild LVH, EF 55-60, apical inf and apical HK, Gr 1 DD, AV mean 13, MAC, trivial MR, reduced RVSF, trivial TR, PASP 35, no eff // Echo 09/02/2018:  Severe focal basal septal hypertrophy, EF 60-65, no RWMA, Gr 2 DD, very mild AS (mean 11), mild LAE   . Type 2 diabetes mellitus ([place])    Past Surgical History:  Procedure Laterality Date  . CARDIAC CATHETERIZATION N/A 12/31/2016   Procedure: Left Heart Cath and Coronary Angiography;  Surgeon: Jettie Booze, MD;  Location: [place] [facility];  Service: Cardiovascular;  Laterality: N/A;  . CARDIAC CATHETERIZATION N/A 12/31/2016   Procedure: Coronary Stent Intervention;  Surgeon: Jettie Booze, MD;  Location: [place] [facility];  Service: Cardiovascular;  Laterality: N/A;  . KYPHOPLASTY      Allergies: Patient has no known allergies.  Medications: Prior to Admission medications   Medication Sig Start Date End Date Taking?  Authorizing Provider  aspirin 81 MG chewable tablet Chew 81 mg by mouth daily.   Yes [provider]  carvedilol (COREG) 6.25 MG tablet Take 1 tablet (6.25 mg total) by mouth 2 (two) times daily with a meal. 01/02/17  Yes Darrick Meigs, Marge Duncans, MD  cephALEXin (KEFLEX) 500 MG capsule Take 1 capsule (500 mg total) by mouth 2 (two) times daily. 11/03/18  Yes Larene Pickett, PA-C  cholecalciferol (VITAMIN D3) 25 MCG (1000 UT) tablet Take 1,000 Units by mouth daily.   Yes [provider]  clopidogrel (PLAVIX) 75 MG tablet Take 1 tablet (75 mg total) by mouth daily. DO NOT START UNTIL FINISHED WITH BRIL[place]TA 08/14/18 08/14/19 Yes Weaver, Scott T, PA-C  HYDROcodone-acetaminophen (NORCO/VICODIN) 5-325 MG tablet Take 1 tablet by mouth every 6 (six) hours as needed. Patient taking differently: Take 1 tablet by mouth 3 (three) times daily.  01/02/17  Yes Oswald Hillock, MD  iron polysaccharides (NIFEREX) 150 MG capsule Take 1 capsule (150 mg total) by mouth daily. 09/21/13  Yes Wenda Low, MD  losartan (COZAAR) 25 MG tablet Take 1 tablet (25 mg total) by mouth daily. 09/03/18  Yes Richardson Dopp T, PA-C  metFORMIN (GLUCOPHAGE) 500 MG tablet Take 500 mg by mouth daily with breakfast. with food 07/31/18  Yes [provider]  rosuvastatin (CRESTOR) 20 MG tablet Take  1 tablet (20 mg total) by mouth daily at 6 PM. 01/02/17  Yes Darrick Meigs, Marge Duncans, MD  acetaminophen (TYLENOL) 500 MG tablet Take 1,000 mg by mouth every 6 (six) hours as needed for mild pain.    [provider]  cholecalciferol 4000 units TABS Take 4,000 Units by mouth daily. Patient not taking: Reported on 11/12/2018 01/02/17   Oswald Hillock, MD     Vital Signs: BP 138/60 (BP Location: Left Arm)   Pulse (!) 58   Temp 98 F (36.7 C) (Oral)   Resp 16   Ht _0  (1.448 m)   Wt 164 lb 3.9 oz (74.5 kg)   SpO2 94%   BMI 35.54 kg/m   Physical Exam awake, alert.  Chest clear to auscultation bilaterally anteriorly.  Heart with  regular rate and rhythm.  Abdomen soft, positive bowel sounds, nontender.  No significant lower extremity edema.  Moderate paravertebral tenderness T6 region  Imaging: No results found.  Labs:  CBC: Recent Labs    11/15/18 0558 11/16/18 0512 11/17/18 0403 11/18/18 0357  WBC 8.3 8.2 9.0 8.3  HGB 12.4 12.8 12.7 12.4  HCT 38.9 40.2 39.8 38.1  PLT 293 264 273 268    COAGS: Recent Labs    11/12/18 1230 11/16/18 1519  INR 0.97 0.90    BMP: Recent Labs    11/15/18 0558 11/16/18 0512 11/17/18 0403 11/18/18 0357  NA 137 134* 135 137  K 3.8 3.8 3.6 3.5  CL 100 99 101 103  CO2 _1 GLUCOSE 284* 204* 185* 167*  BUN _2 CALCIUM 8.1* 8.4* 8.5* 8.3*  CREATININE 0.51 0.51 0.53 0.38*  GFRNONAA >60 >60 >60 >60  GFRAA >60 >60 >60 >60    LIVER FUNCTION TESTS: Recent Labs    11/15/18 0558 11/16/18 0512 11/17/18 0403 11/18/18 0357  BILITOT 0.8 0.9 0.9 0.7  AST 15 14* 14* 14*  ALT _3 ALKPHOS 52 57 56 52  PROT 6.3* 6.1* 6.5 6.3*  ALBUMIN 2.8* 2.8* 2.9* 2.8*    Assessment and Plan: Patient with history of symptomatic acute T6 compression fracture with 50% height loss; imaging studies have been reviewed by Dr. Laurence Ferrari and pt appears to be candidate for T6 vertebral body augmentation. Risks and benefits of procedure were discussed with the patient/family via interpreter including, but not limited to education regarding the natural healing process of compression fractures without intervention, bleeding, infection, cement migration which may cause spinal cord damage, paralysis, pulmonary embolism or even death.  This interventional procedure involves the use of X-rays and because of the nature of the planned procedure, it is possible that we will have prolonged use of X-ray fluoroscopy.  Potential radiation risks to you include (but are not limited to) the following: - A slightly elevated risk for cancer  several years later in life. This risk is  typically less than 0.5% percent. This risk is low in comparison to the normal incidence of human cancer, which is 33% for women and 50% for men according to the [place]. - Radiation induced injury can include skin redness, resembling a rash, tissue breakdown / ulcers and hair loss (which can be temporary or permanent).   The likelihood of either of these occurring depends on the difficulty of the procedure and whether you are sensitive to radiation due to previous procedures, disease, or genetic conditions.   IF your procedure requires a prolonged use of radiation, you  will be notified and given written instructions for further action.  It is your responsibility to monitor the irradiated area for the 2 weeks following the procedure and to notify your physician if you are concerned that you have suffered a radiation induced injury.    All of the patient's questions were answered, patient is agreeable to proceed.  Consent signed and in chart.   She has been off Plavix for approximately 1 week (hx CAD with DES 2018).  Follow-up urine culture negative.  Currently on Lovenox- will need to hold 2200 dose on 12/12, now scheduled for 12/13 pm at [facility].   Electronically Signed: D. Rowe Robert, PA-C 11/18/2018, 4:32 PM   I spent a total of 20 minutes at the the patient's bedside AND on the patient's hospital floor or unit, greater than 50% of which was counseling/coordinating care for T6 vertebral body augmentation

## 2018-11-19 ENCOUNTER — Encounter (HOSPITAL_COMMUNITY): Payer: Self-pay | Admitting: *Deleted

## 2018-11-19 LAB — GLUCOSE, CAPILLARY
GLUCOSE-CAPILLARY: 124 mg/dL — AB (ref 70–99)
Glucose-Capillary: 87 mg/dL (ref 70–99)
Glucose-Capillary: 171 mg/dL — ABNORMAL HIGH (ref 70–99)
Glucose-Capillary: 111 mg/dL — ABNORMAL HIGH (ref 70–99)

## 2018-11-19 LAB — BASIC METABOLIC PANEL
Anion gap: 8 (ref 5–15)
BUN: 10 mg/dL (ref 8–23)
CO2: 26 mmol/L (ref 22–32)
Calcium: 8.5 mg/dL — ABNORMAL LOW (ref 8.9–10.3)
Chloride: 106 mmol/L (ref 98–111)
Creatinine, Ser: 0.48 mg/dL (ref 0.44–1.00)
GFR calc non Af Amer: >60 mL/min (ref >60)
Glucose, Bld: 89 mg/dL (ref 70–99)
Potassium: 4 mmol/L (ref 3.5–5.1)
Sodium: 140 mmol/L (ref 135–145)

## 2018-11-19 MED ORDER — INSULIN GLARGINE 100 UNIT/ML ~~LOC~~ SOLN
6.0000 [IU] | Freq: Every day | SUBCUTANEOUS | Status: DC
  Administered 2018-11-20 – 2018-11-23 (×4): 6 [IU] via SUBCUTANEOUS
  Filled 2018-11-19 (×4): qty 0.06

## 2018-11-19 MED ORDER — ENOXAPARIN SODIUM 40 MG/0.4ML ~~LOC~~ SOLN: 40.0000 mg | SUBCUTANEOUS | Status: DC

## 2018-11-19 MED ORDER — OXYCODONE HCL 5 MG PO TABS
5.0000 mg | ORAL_TABLET | ORAL | Status: DC | PRN
  Administered 2018-11-19 (×3): 5 mg via ORAL
  Filled 2018-11-19 (×3): qty 1

## 2018-11-19 MED ORDER — LIDOCAINE 5 % EX PTCH
1.0000 | MEDICATED_PATCH | CUTANEOUS | Status: DC
  Administered 2018-11-19 – 2018-11-22 (×3): 1 via TRANSDERMAL
  Filled 2018-11-19 (×5): qty 1

## 2018-11-19 MED ORDER — FUROSEMIDE 10 MG/ML IJ SOLN: 40.0000 mg | Freq: Once | INTRAMUSCULAR | Status: DC

## 2018-11-19 MED ORDER — ALBUMIN HUMAN 25 % IV SOLN: 25.0000 g | Freq: Four times a day (QID) | INTRAVENOUS | Status: DC

## 2018-11-19 NOTE — Progress Notes (Signed)
PT Cancellation Note  Patient Details Name: [name] MRN: [id number] DOB: 05/06/1941   Cancelled Treatment:    Reason Eval/Treat Not Completed: Medical issues which prohibited therapy, per Epic/ patient is  [facility]. Will reevaluate post procedure   Rada HayHill, Sherrell Farish Elizabeth 11/19/2018, 7:33 AM .sign

## 2018-11-19 NOTE — Care Management Important Message (Signed)
Important Message  Patient Details  Name: Amy Hood MRN: [id number] Date of Birth: 1941/09/28   Medicare Important Message Given:  Yes    Caren MacadamFuller, Forbes Loll 11/19/2018, 12:23 PMImportant Message  Patient Details  Name: Amy Hood MRN: [id number] Date of Birth: 1941/09/28   Medicare Important Message Given:  Yes    Caren MacadamFuller, Alyssia Heese 11/19/2018, 12:23 PM

## 2018-11-19 NOTE — Progress Notes (Signed)
No change in status. Plan to proceed with T6 VP/KP tomorrow with Dr. Archer AsaMcCullough. NPO p MN Lovenox schedule adjusted. Exact procedure time unknown   Amy ElKevin Lexine Jaspers PA-C Interventional Radiology 11/19/2018 3:46 PM

## 2018-11-19 NOTE — Progress Notes (Signed)
PROGRESS NOTE    Amy Hood  UJW:[id number] DOB: Dec 01, 1941 DOA: 11/12/2018 PCP: Georgann Housekeeper, MD    Brief Narrative:  HPI per Dr. Erin Hearing on 11/12/18 Palmira Adrongis a 77 y.o.femalewith medical history significant ofchronic arthritis, coronary artery disease, atrial fibrillation, dyslipidemia, hypertension,andtype 2 diabetes mellitus. Patient presents with severe lower back pain, sharp in nature, severe in intensity, worse with movement, no improving factors,associated ambulatory dysfunction,poor appetite,and confusion. Patient was seen November 25 in the emergency department for the same complaint, she was diagnosed with a urinary tract infection and was prescribed cephalexin. She did complete acourse of oral antibiotics at home with no improvement of her symptoms.   For the last 10 days patient has been in bedridden due to severe back pain, had difficulty seatingthat has affected her p.o. Intake. Patient usually ambulates with a walker.She takes opiates for pain control.  All the information is obtained through her daughter due to patient's language barriers.  **She speaks [place] and friend interpreted again along with our Armed forces technical officer.  Patient states again that her back pain is an 8/10 in severity and worse with ambulating and working with Therapy.  I have called IR for possible vertebroplasty given her acute back pain and they feel that she would benefit if her symptoms correlate clinically. Upon palpation of her thoracic spine she had severe pain and is not complaining of low back pain now but complaining of pain in her sides of her ribs. IR wanting patient to undergo repeat U/A and Urine Cx which they have ordered.    Assessment & Plan:   Principal Problem:   Compression fracture of T6 vertebra (HCC) Active Problems:   PAF (paroxysmal atrial fibrillation) (HCC)   Type 2 diabetes mellitus without complication (HCC)   Hyperglycemia   Lactic acid  acidosis   CAD S/P percutaneous coronary angioplasty  1 severe back pain secondary to T6 compression fracture complicated by metabolic encephalopathy Metabolic encephalopathy improved. Patient with complaints of worsening back pain in the upper thoracic area with tenderness to palpation with radiation bilaterally.  Patient with complaints of significant pain this morning after going to the bathroom.  Patient noted to have failed outpatient treatment. -MRI of the T-spine done showed acute T6 compression fracture with 50% height loss with bony retropulsion resulting in mild to moderate spinal stenosis no other acute abnormalities.  Chronic compression fractures noted in the L-spine with prior augmentation of L1, L2, L4 and L5 vertebral bodies.  Multilevel degenerative spondylosis with resultant moderate spinal stenosis at L1-L2, L3-L4, L4-L5.  Moderate right L3, bilateral L4 and left L5 foraminal narrowing with bulging disc and facet hypertrophy. -IR consulted for vertebroplasty.  Plavix has been on hold day #7. -Patient with ongoing pain. -Patient was to be transferred to [facility] for vertebroplasty.  Per IR, PA patient able to have vertebroplasty at [facility] long on 11/20/2018 and as such we will cancel transfer. -Discontinued Vicodin and placed on oxycodone every 4 hours as needed.  Continue IV morphine as needed.  Continue scheduled Tylenol 500 mg 3 times daily.  Place a Lidoderm patch.  Change oxycodone to 5 mg every 3 hours as needed.  -PT/OT. -Urinalysis and urine cultures negative.  Patient asymptomatic.  2.  Type 2 diabetes mellitus with uncontrolled hyperglycemia Hemoglobin A1c 8.0.  CBG was 87 this morning.  Continue to hold oral hypoglycemic agents.  Continue SSI.  Patient to be n.p.o. after midnight and as such we will decrease Lantus to 1/2 current dose tomorrow morning.  3.  Coronary artery disease status post stenting Currently asymptomatic.  Chest pain-free.  Continue aspirin and  statin.  Plavix on hold for probable vertebroplasty/kyphoplasty.  IR to advise when Plavix may be resumed.  4.  Hypertension Be stable on current regimen of Coreg.  5.  Hypernatremia Improved.  Follow.  D5 has been discontinued.  6.  Constipation Improved.  Patient having bowel movements.  Continue current bowel regimen of MiraLAX and Senokot.  7.  Leukocytosis Likely reactive.  Resolved.  8.  Lactic acidosis Improved.  Patient noted to be on metformin in the presence of dehydration and poor oral intake.  Follow.    DVT prophylaxis: Lovenox Code Status: Full Family Communication: Updated patient and daughter at bedside via interpreter. Disposition Plan: Pending IR procedure.  Likely skilled nursing facility versus home with home health.   Consultants:   Interventional radiology  Procedures:   Chest x-ray 11/12/2018  MRI T-spine L-spine 11/12/2018  Antimicrobials:   None   Subjective: Patient complaining of upper back pain.  Patient just went to the bathroom and had a bowel movement and since is complaining of significant back pain.  No nausea or vomiting.  No chest pain.  No shortness of breath.  Asking when procedure will be done.  Objective: Vitals:   11/18/18 0546 11/18/18 1503 11/18/18 2216 11/19/18 0603  BP: 120/64 138/60 (!) 152/84 128/67  Pulse: 63 (!) 58 (!) 59 60  Resp: 16 16 16 17   Temp: 98.6 F (37 C) 98 F (36.7 C) 97.6 F (36.4 C) 97.9 F (36.6 C)  TempSrc: Oral Oral Oral Oral  SpO2: 95% 94% 97% 98%  Weight:      Height:        Intake/Output Summary (Last 24 hours) at 11/19/2018 1236 Last data filed at 11/19/2018 0900 Gross per 24 hour  Intake 1300.12 ml  Output 425 ml  Net 875.12 ml   Filed Weights   11/12/18 1257 11/16/18 0545  Weight: 73.2 kg 74.5 kg    Examination:  General exam: NAD  Respiratory system: Lungs clear to auscultation bilaterally.  No wheezes, no crackles, no rhonchi.  Respiratory effort normal. Cardiovascular  system: Regular rate rhythm no murmurs rubs or gallops.  No JVD.  No lower extremity edema. Gastrointestinal system: Abdomen is soft, nontender, nondistended, positive bowel sounds.  No rebound.  No guarding.  Central nervous system: Alert and oriented. No focal neurological deficits. Extremities: Symmetric 5 x 5 power. Skin: No rashes, lesions or ulcers Psychiatry: Judgement and insight appear normal. Mood & affect appropriate.     Data Reviewed: I have personally reviewed following labs and imaging studies  CBC: Recent Labs  Lab 11/14/18 0531 11/15/18 0558 11/16/18 0512 11/17/18 0403 11/18/18 0357  WBC 9.9 8.3 8.2 9.0 8.3  NEUTROABS 7.7 6.7 5.8 6.9 6.0  HGB 11.9* 12.4 12.8 12.7 12.4  HCT 37.5 38.9 40.2 39.8 38.1  MCV 81.2 81.0 82.9 81.9 81.6  PLT 261 293 264 273 268   Basic Metabolic Panel: Recent Labs  Lab 11/14/18 0531 11/15/18 0558 11/16/18 0512 11/17/18 0403 11/18/18 0357 11/19/18 0426  NA 140 137 134* 135 137 140  K 3.9 3.8 3.8 3.6 3.5 4.0  CL 105 100 99 101 103 106  CO2 27 26 26 27 24 26   GLUCOSE 220* 284* 204* 185* 167* 89  BUN 18 9 12 11 10 10   CREATININE 0.51 0.51 0.51 0.53 0.38* 0.48  CALCIUM 8.1* 8.1* 8.4* 8.5* 8.3* 8.5*  MG 2.2 2.2 2.3  2.1 2.2  --   PHOS 3.2 2.6 3.1 3.1 2.6  --    GFR: Estimated Creatinine Clearance: 48.5 mL/min (by C-G formula based on SCr of 0.48 mg/dL). Liver Function Tests: Recent Labs  Lab 11/14/18 0531 11/15/18 0558 11/16/18 0512 11/17/18 0403 11/18/18 0357  AST 20 15 14* 14* 14*  ALT 14 16 14 14 13   ALKPHOS 56 52 57 56 52  BILITOT 0.7 0.8 0.9 0.9 0.7  PROT 6.0* 6.3* 6.1* 6.5 6.3*  ALBUMIN 2.7* 2.8* 2.8* 2.9* 2.8*   No results for input(s): LIPASE, AMYLASE in the last 168 hours. No results for input(s): AMMONIA in the last 168 hours. Coagulation Profile: Recent Labs  Lab 11/16/18 1519  INR 0.90   Cardiac Enzymes: No results for input(s): CKTOTAL, CKMB, CKMBINDEX, TROPONINI in the last 168 hours. BNP (last 3  results) No results for input(s): PROBNP in the last 8760 hours. HbA1C: No results for input(s): HGBA1C in the last 72 hours. CBG: Recent Labs  Lab 11/18/18 1151 11/18/18 1701 11/18/18 2218 11/19/18 0739 11/19/18 1155  GLUCAP 124* 82 104* 87 171*   Lipid Profile: No results for input(s): CHOL, HDL, LDLCALC, TRIG, CHOLHDL, LDLDIRECT in the last 72 hours. Thyroid Function Tests: No results for input(s): TSH, T4TOTAL, FREET4, T3FREE, THYROIDAB in the last 72 hours. Anemia Panel: No results for input(s): VITAMINB12, FOLATE, FERRITIN, TIBC, IRON, RETICCTPCT in the last 72 hours. Sepsis Labs: Recent Labs  Lab 11/12/18 1358 11/14/18 0900 11/14/18 1232  LATICACIDVEN 2.51* 2.2* 1.7    Recent Results (from the past 240 hour(s))  Culture, blood (Routine x 2)     Status: None   Collection Time: 11/12/18 12:18 PM  Result Value Ref Range Status   Specimen Description   Final    BLOOD LEFT ANTECUBITAL Performed at [facility], [address] [place], [place] [address]    Special Requests   Final    BOTTLES DRAWN AEROBIC AND ANAEROBIC Blood Culture adequate volume   Culture   Final    NO GROWTH 5 DAYS Performed at [facility], [address], [place], [place] [address]    Report Status 11/17/2018 FINAL  Final  Urine Culture     Status: None   Collection Time: 11/16/18  1:25 PM  Result Value Ref Range Status   Specimen Description   Final    URINE, CLEAN CATCH Performed at [facility], [address] [place], [place] [address]    Special Requests   Final    NONE Performed at [facility], [address], [place], [place] [address]    Culture   Final    NO GROWTH Performed at [facility], [address], [place], [place] [address]    Report Status 11/18/2018 FINAL  Final         Radiology Studies: No results found.      Scheduled Meds: . acetaminophen  500 mg Oral TID  . aspirin  81 mg  Oral Daily  . carvedilol  6.25 mg Oral BID WC  . cholecalciferol  1,000 Units Oral Daily  . [START ON 11/20/2018] enoxaparin (LOVENOX) injection  40 mg Subcutaneous Q24H  . insulin aspart  0-15 Units Subcutaneous TID WC  . insulin aspart  0-5 Units Subcutaneous QHS  . insulin glargine  12 Units Subcutaneous Daily  . iron polysaccharides  150 mg Oral Daily  . polyethylene glycol  17 g Oral BID  . rosuvastatin  20 mg Oral q1800  . senna-docusate  1 tablet Oral BID   Continuous Infusions: . sodium chloride 10 mL/hr at 11/16/18 0425  . [START ON 11/20/2018]  ceFAZolin (ANCEF) IV       LOS: 7 days    Time spent: 40 mins    Ramiro Harvestaniel Thompson, MD [facility] Pager [phone]  If 7PM-7AM, please contact night-coverage [email or web address] Password [email or web address] 11/19/2018, 12:36 PM

## 2018-11-20 ENCOUNTER — Inpatient Hospital Stay (HOSPITAL_COMMUNITY): Payer: Medicare Other

## 2018-11-20 ENCOUNTER — Encounter (HOSPITAL_COMMUNITY): Payer: Self-pay | Admitting: Interventional Radiology

## 2018-11-20 HISTORY — PX: IR KYPHO THORACIC WITH BONE BIOPSY: IMG5518

## 2018-11-20 LAB — BASIC METABOLIC PANEL
Anion gap: 9 (ref 5–15)
BUN: 15 mg/dL (ref 8–23)
CO2: 25 mmol/L (ref 22–32)
Calcium: 8.5 mg/dL — ABNORMAL LOW (ref 8.9–10.3)
Chloride: 104 mmol/L (ref 98–111)
Creatinine, Ser: 0.61 mg/dL (ref 0.44–1.00)
GFR calc Af Amer: >60 mL/min (ref >60)
GFR calc non Af Amer: >60 mL/min (ref >60)
Glucose, Bld: 110 mg/dL — ABNORMAL HIGH (ref 70–99)
Potassium: 4.1 mmol/L (ref 3.5–5.1)
Sodium: 138 mmol/L (ref 135–145)

## 2018-11-20 LAB — CBC
HCT: 36.1 % (ref 36.0–46.0)
Hemoglobin: 11.5 g/dL — ABNORMAL LOW (ref 12.0–15.0)
MCH: 26 pg (ref 26.0–34.0)
MCHC: 31.9 g/dL (ref 30.0–36.0)
MCV: 81.5 fL (ref 80.0–100.0)
Platelets: 300 10*3/uL (ref 150–400)
RBC: 4.43 MIL/uL (ref 3.87–5.11)
RDW: 13 % (ref 11.5–15.5)
WBC: 6 10*3/uL (ref 4.0–10.5)
nRBC: 0 % (ref 0.0–0.2)

## 2018-11-20 LAB — GLUCOSE, CAPILLARY
GLUCOSE-CAPILLARY: 117 mg/dL — AB (ref 70–99)
Glucose-Capillary: 118 mg/dL — ABNORMAL HIGH (ref 70–99)
Glucose-Capillary: 122 mg/dL — ABNORMAL HIGH (ref 70–99)
Glucose-Capillary: 182 mg/dL — ABNORMAL HIGH (ref 70–99)

## 2018-11-20 MED ORDER — LIDOCAINE HCL (PF) 1 % IJ SOLN
INTRAMUSCULAR | Status: AC | PRN
  Administered 2018-11-20: 10 mL
  Administered 2018-11-20: 5 mL

## 2018-11-20 MED ORDER — CEFAZOLIN SODIUM-DEXTROSE 2-4 GM/100ML-% IV SOLN
INTRAVENOUS | Status: AC
  Administered 2018-11-20: 2 g via INTRAVENOUS
  Filled 2018-11-20: qty 100

## 2018-11-20 MED ORDER — LIDOCAINE HCL (PF) 1 % IJ SOLN
INTRAMUSCULAR | Status: AC
  Filled 2018-11-20: qty 30

## 2018-11-20 MED ORDER — FENTANYL CITRATE (PF) 100 MCG/2ML IJ SOLN
INTRAMUSCULAR | Status: AC | PRN
  Administered 2018-11-20: 25 ug via INTRAVENOUS
  Administered 2018-11-20: 50 ug via INTRAVENOUS
  Administered 2018-11-20: 25 ug via INTRAVENOUS
  Administered 2018-11-20: 50 ug via INTRAVENOUS

## 2018-11-20 MED ORDER — IOPAMIDOL (ISOVUE-300) INJECTION 61%: 50.0000 mL | Freq: Once | INTRAVENOUS | Status: DC | PRN

## 2018-11-20 MED ORDER — MIDAZOLAM HCL 2 MG/2ML IJ SOLN
INTRAMUSCULAR | Status: AC | PRN
  Administered 2018-11-20: 0.5 mg via INTRAVENOUS
  Administered 2018-11-20 (×2): 1 mg via INTRAVENOUS
  Administered 2018-11-20: 0.5 mg via INTRAVENOUS

## 2018-11-20 MED ORDER — FENTANYL CITRATE (PF) 100 MCG/2ML IJ SOLN
INTRAMUSCULAR | Status: AC
  Filled 2018-11-20: qty 4

## 2018-11-20 MED ORDER — IOPAMIDOL (ISOVUE-300) INJECTION 61%
INTRAVENOUS | Status: AC
  Filled 2018-11-20: qty 50

## 2018-11-20 MED ORDER — MIDAZOLAM HCL 2 MG/2ML IJ SOLN
INTRAMUSCULAR | Status: AC
  Filled 2018-11-20: qty 6

## 2018-11-20 MED ORDER — ENOXAPARIN SODIUM 40 MG/0.4ML ~~LOC~~ SOLN
40.0000 mg | SUBCUTANEOUS | Status: DC
  Administered 2018-11-21 – 2018-11-22 (×2): 40 mg via SUBCUTANEOUS
  Filled 2018-11-20 (×2): qty 0.4

## 2018-11-20 NOTE — Progress Notes (Signed)
PROGRESS NOTE    Amy Hood  ZOX:[id number] DOB: 1941/01/22 DOA: 11/12/2018 PCP: Georgann Housekeeper, MD    Brief Narrative:  HPI per Dr. Erin Hearing on 11/12/18 Amy Hood a 77 y.o.femalewith medical history significant ofchronic arthritis, coronary artery disease, atrial fibrillation, dyslipidemia, hypertension,andtype 2 diabetes mellitus. Patient presents with severe lower back pain, sharp in nature, severe in intensity, worse with movement, no improving factors,associated ambulatory dysfunction,poor appetite,and confusion. Patient was seen November 25 in the emergency department for the same complaint, she was diagnosed with a urinary tract infection and was prescribed cephalexin. She did complete acourse of oral antibiotics at home with no improvement of her symptoms.   For the last 10 days patient has been in bedridden due to severe back pain, had difficulty seatingthat has affected her p.o. Intake. Patient usually ambulates with a walker.She takes opiates for pain control.  All the information is obtained through her daughter due to patient's language barriers.  **She speaks [place] and friend interpreted again along with our Armed forces technical officer.  Patient states again that her back pain is an 8/10 in severity and worse with ambulating and working with Therapy.  I have called IR for possible vertebroplasty given her acute back pain and they feel that she would benefit if her symptoms correlate clinically. Upon palpation of her thoracic spine she had severe pain and is not complaining of low back pain now but complaining of pain in her sides of her ribs. IR wanting patient to undergo repeat U/A and Urine Cx which they have ordered.    Assessment & Plan:   Principal Problem:   Compression fracture of T6 vertebra (HCC) Active Problems:   PAF (paroxysmal atrial fibrillation) (HCC)   Type 2 diabetes mellitus without complication (HCC)   Hyperglycemia   Lactic acid  acidosis   CAD S/P percutaneous coronary angioplasty  1 severe back pain secondary to T6 compression fracture complicated by metabolic encephalopathy Metabolic encephalopathy improved. Patient with complaints of worsening back pain in the upper thoracic area with tenderness to palpation with radiation bilaterally.  Patient with complaints of significant pain the morning of 11/19/2018, after going to the bathroom.  Patient noted to have failed outpatient treatment. -MRI of the T-spine done showed acute T6 compression fracture with 50% height loss with bony retropulsion resulting in mild to moderate spinal stenosis no other acute abnormalities.  Chronic compression fractures noted in the L-spine with prior augmentation of L1, L2, L4 and L5 vertebral bodies.  Multilevel degenerative spondylosis with resultant moderate spinal stenosis at L1-L2, L3-L4, L4-L5.  Moderate right L3, bilateral L4 and left L5 foraminal narrowing with bulging disc and facet hypertrophy. -IR consulted for vertebroplasty.  Plavix has been on hold day #8. -Patient with ongoing pain. -Patient was to be transferred to [facility] for vertebroplasty.  Per IR, PA patient able to have vertebroplasty at [facility] long on 11/20/2018 and as such transfer was canceled.  -Discontinued Vicodin and placed on oxycodone every 4 hours as needed.  Continue IV morphine as needed.  Continue scheduled Tylenol 500 mg 3 times daily.  Lidoderm patch was placed on 11/19/2018.  Oxycodone changed to 5 mg every 3 hours as needed.   -Patient for kyphoplasty this morning 11/20/2018.   -IR to advise when PT OT may be resumed.   -Urinalysis and urine cultures negative.  Patient asymptomatic.   -Follow.   2.  Type 2 diabetes mellitus with uncontrolled hyperglycemia Hemoglobin A1c 8.0.  CBG was 118 this morning.  Continue to  hold oral hypoglycemic agents.  Continue SSI.  Patient currently n.p.o. and Lantus dose has been decreased to 6 units daily.  Postprocedure  once patient tolerating diet likely increase Lantus back to 12 units tomorrow.  3.  Coronary artery disease status post stenting Asymptomatic.  Denies any chest pain.  Plavix on hold for vertebroplasty/kyphoplasty to be done today.  IR to advise when Plavix may be resumed.  Continue aspirin and statin.   4.  Hypertension Stable.  Continue current regimen of Coreg.   5.  Hypernatremia Improved with D5 W.  D5W has been discontinued.  Follow.  6.  Constipation Improved on current bowel regimen.  Patient having bowel movements.  Continue MiraLAX and Senokot.  7.  Leukocytosis Reactive.  Resolved.    8.  Lactic acidosis Improved.  Patient noted to be on metformin in the presence of dehydration and poor oral intake.  Follow.    DVT prophylaxis: Lovenox Code Status: Full Family Communication: Updated patient and daughter at bedside via interpreter. Disposition Plan: Pending IR procedure.  Likely skilled nursing facility versus home with home health.   Consultants:   Interventional radiology  Procedures:   Chest x-ray 11/12/2018  MRI T-spine L-spine 11/12/2018    Antimicrobials:   None   Subjective: Patient sleeping however easily arousable.  Patient states better management with back pain after adjustment of oxycodone and placement of Lidoderm patch.  Still with pain on movement.  No chest pain.  No shortness of breath.  No nausea.  No vomiting.   Objective: Vitals:   11/19/18 0603 11/19/18 1350 11/19/18 2115 11/20/18 0616  BP: 128/67 103/60 102/68 109/73  Pulse: 60 65 (!) 58 61  Resp: 17 16 18 17   Temp: 97.9 F (36.6 C) 98 F (36.7 C) 98.3 F (36.8 C) 98.1 F (36.7 C)  TempSrc: Oral Oral Oral Oral  SpO2: 98% 93% 95% 96%  Weight:      Height:        Intake/Output Summary (Last 24 hours) at 11/20/2018 0758 Last data filed at 11/20/2018 0400 Gross per 24 hour  Intake 606.83 ml  Output -  Net 606.83 ml   Filed Weights   11/12/18 1257 11/16/18 0545    Weight: 73.2 kg 74.5 kg    Examination:  General exam: NAD  Respiratory system: Clear to auscultation bilaterally anterior lung fields.  No wheezes, no crackles, no rhonchi.  Normal respiratory effort.  Cardiovascular system: RRR no murmurs rubs or gallops.  No JVD.  No lower extremity edema.   Gastrointestinal system: Abdomen is nontender, nondistended, soft, positive bowel sounds.  No rebound.  No guarding.   Central nervous system: Alert and oriented. No focal neurological deficits. Extremities: Symmetric 5 x 5 power. Skin: No rashes, lesions or ulcers Psychiatry: Judgement and insight appear normal. Mood & affect appropriate.     Data Reviewed: I have personally reviewed following labs and imaging studies  CBC: Recent Labs  Lab 11/14/18 0531 11/15/18 0558 11/16/18 0512 11/17/18 0403 11/18/18 0357 11/20/18 0448  WBC 9.9 8.3 8.2 9.0 8.3 6.0  NEUTROABS 7.7 6.7 5.8 6.9 6.0  --   HGB 11.9* 12.4 12.8 12.7 12.4 11.5*  HCT 37.5 38.9 40.2 39.8 38.1 36.1  MCV 81.2 81.0 82.9 81.9 81.6 81.5  PLT 261 293 264 273 268 300   Basic Metabolic Panel: Recent Labs  Lab 11/14/18 0531 11/15/18 0558 11/16/18 0512 11/17/18 0403 11/18/18 0357 11/19/18 0426 11/20/18 0448  NA 140 137 134* 135 137 140 138  K 3.9 3.8 3.8 3.6 3.5 4.0 4.1  CL 105 100 99 101 103 106 104  CO2 27 26 26 27 24 26 25   GLUCOSE 220* 284* 204* 185* 167* 89 110*  BUN 18 9 12 11 10 10 15   CREATININE 0.51 0.51 0.51 0.53 0.38* 0.48 0.61  CALCIUM 8.1* 8.1* 8.4* 8.5* 8.3* 8.5* 8.5*  MG 2.2 2.2 2.3 2.1 2.2  --   --   PHOS 3.2 2.6 3.1 3.1 2.6  --   --    GFR: Estimated Creatinine Clearance: 48.5 mL/min (by C-G formula based on SCr of 0.61 mg/dL). Liver Function Tests: Recent Labs  Lab 11/14/18 0531 11/15/18 0558 11/16/18 0512 11/17/18 0403 11/18/18 0357  AST 20 15 14* 14* 14*  ALT 14 16 14 14 13   ALKPHOS 56 52 57 56 52  BILITOT 0.7 0.8 0.9 0.9 0.7  PROT 6.0* 6.3* 6.1* 6.5 6.3*  ALBUMIN 2.7* 2.8* 2.8* 2.9*  2.8*   No results for input(s): LIPASE, AMYLASE in the last 168 hours. No results for input(s): AMMONIA in the last 168 hours. Coagulation Profile: Recent Labs  Lab 11/16/18 1519  INR 0.90   Cardiac Enzymes: No results for input(s): CKTOTAL, CKMB, CKMBINDEX, TROPONINI in the last 168 hours. BNP (last 3 results) No results for input(s): PROBNP in the last 8760 hours. HbA1C: No results for input(s): HGBA1C in the last 72 hours. CBG: Recent Labs  Lab 11/18/18 2218 11/19/18 0739 11/19/18 1155 11/19/18 1708 11/19/18 2113  GLUCAP 104* 87 171* 124* 111*   Lipid Profile: No results for input(s): CHOL, HDL, LDLCALC, TRIG, CHOLHDL, LDLDIRECT in the last 72 hours. Thyroid Function Tests: No results for input(s): TSH, T4TOTAL, FREET4, T3FREE, THYROIDAB in the last 72 hours. Anemia Panel: No results for input(s): VITAMINB12, FOLATE, FERRITIN, TIBC, IRON, RETICCTPCT in the last 72 hours. Sepsis Labs: Recent Labs  Lab 11/14/18 0900 11/14/18 1232  LATICACIDVEN 2.2* 1.7    Recent Results (from the past 240 hour(s))  Culture, blood (Routine x 2)     Status: None   Collection Time: 11/12/18 12:18 PM  Result Value Ref Range Status   Specimen Description   Final    BLOOD LEFT ANTECUBITAL Performed at [facility], [address] [place], [place] [address]    Special Requests   Final    BOTTLES DRAWN AEROBIC AND ANAEROBIC Blood Culture adequate volume   Culture   Final    NO GROWTH 5 DAYS Performed at [facility], [address] [place], [place] [address]    Report Status 11/17/2018 FINAL  Final  Urine Culture     Status: None   Collection Time: 11/16/18  1:25 PM  Result Value Ref Range Status   Specimen Description   Final    URINE, CLEAN CATCH Performed at [facility], [address] [place], [place] [address]    Special Requests   Final    NONE Performed at [facility], [address] [place],  [place] [address]    Culture   Final    NO GROWTH Performed at [facility], [address] [place], [place] [address]    Report Status 11/18/2018 FINAL  Final         Radiology Studies: No results found.      Scheduled Meds: . acetaminophen  500 mg Oral TID  . aspirin  81 mg Oral Daily  . carvedilol  6.25 mg Oral BID WC  .  cholecalciferol  1,000 Units Oral Daily  . enoxaparin (LOVENOX) injection  40 mg Subcutaneous Q24H  . insulin aspart  0-15 Units Subcutaneous TID WC  . insulin aspart  0-5 Units Subcutaneous QHS  . insulin glargine  6 Units Subcutaneous Daily  . iron polysaccharides  150 mg Oral Daily  . lidocaine  1 patch Transdermal Q24H  . polyethylene glycol  17 g Oral BID  . rosuvastatin  20 mg Oral q1800  . senna-docusate  1 tablet Oral BID   Continuous Infusions: . sodium chloride 1,000 mL (11/19/18 2142)  .  ceFAZolin (ANCEF) IV       LOS: 8 days    Time spent: 40 mins    Ramiro Harvest, MD [facility] Pager [phone]  If 7PM-7AM, please contact night-coverage [email or web address] Password [facility] 11/20/2018, 7:58 AM

## 2018-11-20 NOTE — Sedation Documentation (Signed)
Pt going in and out of ventricular bigeminy, MD aware.

## 2018-11-20 NOTE — Procedures (Signed)
Interventional Radiology Procedure Note  Procedure: T7 kyphoplasty  Complications: None  Estimated Blood Loss: None  Recommendations: - Bedrest x 3 hrs  Signed,  Sterling BigHeath K. McCullough, MD

## 2018-11-20 NOTE — Progress Notes (Signed)
Post IR Anti-coagulation  Kyphoplasty 12/13, last Lovenox 40mg  12/11 pm, held 12/12 pm in anticipation of procedure  Post- procedure risk of bleed - Standard Resume anti-coagulation at Day + 1  Lovenox 40mg  daily resumed 12/14 at 1000  Thank you,  Otho BellowsGreen, Haliyah Fryman L PharmD Pager [phone] 11/20/2018, 1:22 PM

## 2018-11-21 LAB — CBC WITH DIFFERENTIAL/PLATELET
ABS IMMATURE GRANULOCYTES: 0.01 10*3/uL (ref 0.00–0.07)
Basophils Absolute: 0 10*3/uL (ref 0.0–0.1)
Basophils Relative: 0 %
Eosinophils Absolute: 0.1 10*3/uL (ref 0.0–0.5)
Eosinophils Relative: 3 %
HCT: 37.7 % (ref 36.0–46.0)
Hemoglobin: 12 g/dL (ref 12.0–15.0)
IMMATURE GRANULOCYTES: 0 %
Lymphocytes Relative: 22 %
Lymphs Abs: 1.2 10*3/uL (ref 0.7–4.0)
MCH: 25.9 pg — AB (ref 26.0–34.0)
MCHC: 31.8 g/dL (ref 30.0–36.0)
MCV: 81.4 fL (ref 80.0–100.0)
Monocytes Absolute: 0.4 10*3/uL (ref 0.1–1.0)
Monocytes Relative: 8 %
NEUTROS ABS: 3.8 10*3/uL (ref 1.7–7.7)
NEUTROS PCT: 67 %
Platelets: 284 10*3/uL (ref 150–400)
RBC: 4.63 MIL/uL (ref 3.87–5.11)
RDW: 13.2 % (ref 11.5–15.5)
WBC: 5.6 10*3/uL (ref 4.0–10.5)
nRBC: 0 % (ref 0.0–0.2)

## 2018-11-21 LAB — BASIC METABOLIC PANEL
Anion gap: 10 (ref 5–15)
BUN: 10 mg/dL (ref 8–23)
CO2: 24 mmol/L (ref 22–32)
Calcium: 8.3 mg/dL — ABNORMAL LOW (ref 8.9–10.3)
Chloride: 104 mmol/L (ref 98–111)
Creatinine, Ser: 0.51 mg/dL (ref 0.44–1.00)
GFR calc Af Amer: >60 mL/min (ref >60)
Glucose, Bld: 125 mg/dL — ABNORMAL HIGH (ref 70–99)
Potassium: 3.9 mmol/L (ref 3.5–5.1)
Sodium: 138 mmol/L (ref 135–145)

## 2018-11-21 LAB — GLUCOSE, CAPILLARY
GLUCOSE-CAPILLARY: 83 mg/dL (ref 70–99)
Glucose-Capillary: 130 mg/dL — ABNORMAL HIGH (ref 70–99)
Glucose-Capillary: 140 mg/dL — ABNORMAL HIGH (ref 70–99)

## 2018-11-21 MED ORDER — OXYCODONE HCL 5 MG PO TABS
5.0000 mg | ORAL_TABLET | Freq: Four times a day (QID) | ORAL | Status: DC
  Administered 2018-11-21 – 2018-11-22 (×5): 5 mg via ORAL
  Filled 2018-11-21 (×5): qty 1

## 2018-11-21 MED ORDER — LOSARTAN POTASSIUM 50 MG PO TABS
25.0000 mg | ORAL_TABLET | Freq: Every day | ORAL | Status: DC
  Administered 2018-11-21 – 2018-11-23 (×3): 25 mg via ORAL
  Filled 2018-11-21 (×4): qty 1

## 2018-11-21 MED ORDER — CLOPIDOGREL BISULFATE 75 MG PO TABS
75.0000 mg | ORAL_TABLET | Freq: Every day | ORAL | Status: DC
  Administered 2018-11-22 – 2018-11-23 (×2): 75 mg via ORAL
  Filled 2018-11-21 (×3): qty 1

## 2018-11-21 NOTE — Progress Notes (Signed)
PROGRESS NOTE    Amy Hood  ZOX:[id number] DOB: 07/05/1941 DOA: 11/12/2018 PCP: Georgann Housekeeper, MD    Brief Narrative:  HPI per Dr. Erin Hearing on 11/12/18 Amy Hood a 77 y.o.femalewith medical history significant ofchronic arthritis, coronary artery disease, atrial fibrillation, dyslipidemia, hypertension,andtype 2 diabetes mellitus. Patient presents with severe lower back pain, sharp in nature, severe in intensity, worse with movement, no improving factors,associated ambulatory dysfunction,poor appetite,and confusion. Patient was seen November 25 in the emergency department for the same complaint, she was diagnosed with a urinary tract infection and was prescribed cephalexin. She did complete acourse of oral antibiotics at home with no improvement of her symptoms.   For the last 10 days patient has been in bedridden due to severe back pain, had difficulty seatingthat has affected her p.o. Intake. Patient usually ambulates with a walker.She takes opiates for pain control.  All the information is obtained through her daughter due to patient's language barriers.  **She speaks [place] and friend interpreted again along with our Armed forces technical officer.  Patient states again that her back pain is an 8/10 in severity and worse with ambulating and working with Therapy.  I have called IR for possible vertebroplasty given her acute back pain and they feel that she would benefit if her symptoms correlate clinically. Upon palpation of her thoracic spine she had severe pain and is not complaining of low back pain now but complaining of pain in her sides of her ribs. IR wanting patient to undergo repeat U/A and Urine Cx which they have ordered.    Assessment & Plan:   Principal Problem:   Compression fracture of T6 vertebra (HCC) Active Problems:   PAF (paroxysmal atrial fibrillation) (HCC)   Type 2 diabetes mellitus without complication (HCC)   Hyperglycemia   Lactic acid  acidosis   CAD S/P percutaneous coronary angioplasty  1 severe back pain secondary to T6 compression fracture complicated by metabolic encephalopathy Metabolic encephalopathy improved. Patient with complaints of back pain in the upper thoracic area with tenderness to palpation with radiation bilaterally.  Patient with complaints of significant pain the morning of 11/19/2018, after going to the bathroom.  Patient noted to have failed outpatient treatment. -MRI of the T-spine done showed acute T6 compression fracture with 50% height loss with bony retropulsion resulting in mild to moderate spinal stenosis no other acute abnormalities.  Chronic compression fractures noted in the L-spine with prior augmentation of L1, L2, L4 and L5 vertebral bodies.  Multilevel degenerative spondylosis with resultant moderate spinal stenosis at L1-L2, L3-L4, L4-L5.  Moderate right L3, bilateral L4 and left L5 foraminal narrowing with bulging disc and facet hypertrophy. -IR consulted for vertebroplasty.  Plavix has been on hold day #8. -Patient with ongoing pain. -Patient was to be transferred to [facility] for vertebroplasty.  Per IR, PA patient able to have vertebroplasty at [facility] long on 11/20/2018 and as such transfer was canceled.  -Discontinued Vicodin and placed on oxycodone every 4 hours as needed.  Continue IV morphine as needed.  Continue scheduled Tylenol 500 mg 3 times daily.  Lidoderm patch was placed on 11/19/2018.  Oxycodone changed to 5 mg every 3 hours as needed.   -Patient s/p T7 kyphoplasty 11/20/2018.  -Patient still with complaints of pain however it is noted that patient has not received any pain medications over the past 24 to 48 hours as pain regimen is as needed and likely due to language barrier.  Will schedule oxycodone 5 mg 4 times daily. -PT/OT to  reevaluate. -Urinalysis and urine cultures negative.  Patient asymptomatic.   -Follow.   2.  Type 2 diabetes mellitus with uncontrolled  hyperglycemia Hemoglobin A1c 8.0.  CBG was 130 this morning.  Continue to hold oral hypoglycemic agents.  Continue SSI.  Patient diet has been resumed.  Continue Lantus 6 units daily as well as sliding scale insulin for now.  May uptitrate Lantus to 12 units as needed for better CBG control.  Monitor CBG for now.   3.  Coronary artery disease status post stenting Asymptomatic.  Stable.  Plavix has been on hold for vertebroplasty/kyphoplasty which was done the evening of 11/20/2018.  Will resume Plavix tomorrow.  Continue aspirin and statin.   4.  Hypertension Stable.  Continue current regimen of Coreg.   5.  Hypernatremia Improved with D5 W.  IV fluids have been saline locked.  6.  Constipation Improved on current bowel regimen.  Continue current bowel regimen of MiraLAX and Senokot.   7.  Leukocytosis Reactive.  Resolved.    8.  Lactic acidosis Improved.  Patient noted to be on metformin in the presence of dehydration and poor oral intake.  Follow.    DVT prophylaxis: Lovenox Code Status: Full Family Communication: Updated patient, no family at bedside.  Disposition Plan: Pending PT evaluation post procedure.  Likely skilled nursing facility versus home with home health.    Consultants:   Interventional radiology  Procedures:   Chest x-ray 11/12/2018  MRI T-spine L-spine 11/12/2018  T7 kyphoplasty per Dr. Archer Asa 11/20/2018  Antimicrobials:   None   Subjective: Patient laying in bed.  Still complains of back pain even after kyphoplasty/vertebroplasty.  It is noted however that patient has not received any as needed pain medications over the past 24 hours likely secondary to language barrier and patient not knowing to ask for pain medication when she needs it.   Objective: Vitals:   11/20/18 0930 11/20/18 1505 11/20/18 2052 11/21/18 0505  BP: 138/87 (!) 150/66 93/63 (!) 158/63  Pulse: 72 73 64 65  Resp:  16 18 19   Temp:  98.4 F (36.9 C) 98.1 F (36.7 C) 98.1  F (36.7 C)  TempSrc:  Oral Oral Oral  SpO2: 98% 96% 93% 92%  Weight:      Height:        Intake/Output Summary (Last 24 hours) at 11/21/2018 1134 Last data filed at 11/21/2018 0854 Gross per 24 hour  Intake 720 ml  Output 850 ml  Net -130 ml   Filed Weights   11/12/18 1257 11/16/18 0545  Weight: 73.2 kg 74.5 kg    Examination:  General exam: NAD  Respiratory system: Lungs clear to auscultation bilaterally.  No wheezes, no crackles, no rhonchi.  Cardiovascular system: Regular rate and rhythm no murmurs rubs or gallops.  No JVD.  No lower extremity edema.  Gastrointestinal system: Abdomen is soft, nontender, nondistended, positive bowel sounds.  No rebound.  No guarding.   Central nervous system: Alert and oriented. No focal neurological deficits. Extremities: Symmetric 5 x 5 power. Skin: No rashes, lesions or ulcers Psychiatry: Judgement and insight appear normal. Mood & affect appropriate.     Data Reviewed: I have personally reviewed following labs and imaging studies  CBC: Recent Labs  Lab 11/15/18 0558 11/16/18 0512 11/17/18 0403 11/18/18 0357 11/20/18 0448 11/21/18 0546  WBC 8.3 8.2 9.0 8.3 6.0 5.6  NEUTROABS 6.7 5.8 6.9 6.0  --  3.8  HGB 12.4 12.8 12.7 12.4 11.5* 12.0  HCT 38.9 40.2  39.8 38.1 36.1 37.7  MCV 81.0 82.9 81.9 81.6 81.5 81.4  PLT 293 264 273 268 300 284   Basic Metabolic Panel: Recent Labs  Lab 11/15/18 0558 11/16/18 0512 11/17/18 0403 11/18/18 0357 11/19/18 0426 11/20/18 0448 11/21/18 0546  NA 137 134* 135 137 140 138 138  K 3.8 3.8 3.6 3.5 4.0 4.1 3.9  CL 100 99 101 103 106 104 104  CO2 26 26 27 24 26 25 24   GLUCOSE 284* 204* 185* 167* 89 110* 125*  BUN 9 12 11 10 10 15 10   CREATININE 0.51 0.51 0.53 0.38* 0.48 0.61 0.51  CALCIUM 8.1* 8.4* 8.5* 8.3* 8.5* 8.5* 8.3*  MG 2.2 2.3 2.1 2.2  --   --   --   PHOS 2.6 3.1 3.1 2.6  --   --   --    GFR: Estimated Creatinine Clearance: 48.5 mL/min (by C-G formula based on SCr of 0.51  mg/dL). Liver Function Tests: Recent Labs  Lab 11/15/18 0558 11/16/18 0512 11/17/18 0403 11/18/18 0357  AST 15 14* 14* 14*  ALT 16 14 14 13   ALKPHOS 52 57 56 52  BILITOT 0.8 0.9 0.9 0.7  PROT 6.3* 6.1* 6.5 6.3*  ALBUMIN 2.8* 2.8* 2.9* 2.8*   No results for input(s): LIPASE, AMYLASE in the last 168 hours. No results for input(s): AMMONIA in the last 168 hours. Coagulation Profile: Recent Labs  Lab 11/16/18 1519  INR 0.90   Cardiac Enzymes: No results for input(s): CKTOTAL, CKMB, CKMBINDEX, TROPONINI in the last 168 hours. BNP (last 3 results) No results for input(s): PROBNP in the last 8760 hours. HbA1C: No results for input(s): HGBA1C in the last 72 hours. CBG: Recent Labs  Lab 11/20/18 0752 11/20/18 1149 11/20/18 1656 11/20/18 2053 11/21/18 0748  GLUCAP 118* 122* 182* 117* 130*   Lipid Profile: No results for input(s): CHOL, HDL, LDLCALC, TRIG, CHOLHDL, LDLDIRECT in the last 72 hours. Thyroid Function Tests: No results for input(s): TSH, T4TOTAL, FREET4, T3FREE, THYROIDAB in the last 72 hours. Anemia Panel: No results for input(s): VITAMINB12, FOLATE, FERRITIN, TIBC, IRON, RETICCTPCT in the last 72 hours. Sepsis Labs: Recent Labs  Lab 11/14/18 1232  LATICACIDVEN 1.7    Recent Results (from the past 240 hour(s))  Culture, blood (Routine x 2)     Status: None   Collection Time: 11/12/18 12:18 PM  Result Value Ref Range Status   Specimen Description   Final    BLOOD LEFT ANTECUBITAL Performed at [facility], [address] [place], [place] [address]    Special Requests   Final    BOTTLES DRAWN AEROBIC AND ANAEROBIC Blood Culture adequate volume   Culture   Final    NO GROWTH 5 DAYS Performed at [facility], [address] [place], [place] [address]    Report Status 11/17/2018 FINAL  Final  Urine Culture     Status: None   Collection Time: 11/16/18  1:25 PM  Result Value Ref Range Status   Specimen Description   Final     URINE, CLEAN CATCH Performed at [facility], [address] [place], [place] [address]    Special Requests   Final    NONE Performed at [facility], [address] [place], [place] [address]    Culture   Final    NO GROWTH Performed at [facility], [address] [place], [place] [address]    Report Status 11/18/2018 FINAL  Final         Radiology Studies: Ir Kypho Thoracic With Bone Biopsy  Result Date: 11/20/2018 INDICATION: 77 year old female with symptomatic T7 compression fracture. EXAM: Prior MRI thoracic and lumbar spine COMPARISON:  Prior MRI thoracic and lumbar spine 11/12/2018 MEDICATIONS: As antibiotic prophylaxis, 2 g Ancef was ordered pre-procedure and administered intravenously within 1 hour of incision. ANESTHESIA/SEDATION: Moderate (conscious) sedation was employed during this procedure. A total of Versed 3 mg and Fentanyl 150 mcg was administered intravenously. Moderate Sedation Time: 41 minutes. The patient's level of consciousness and vital signs were monitored continuously by radiology nursing throughout the procedure under my direct supervision. FLUOROSCOPY TIME:  Fluoroscopy Time: 11 minutes 36 seconds (475 mGy) COMPLICATIONS: None immediate. PROCEDURE: Following a full explanation of the procedure along with the potential associated complications, an informed witnessed consent was obtained. The patient was placed prone on the fluoroscopic table. The skin overlying the thoracic region was then prepped and draped in the usual sterile fashion. The right pedicle at T7 was then infiltrated with 0.25% bupivacaine followed by the advancement of an 11-gauge Jamshidi needle through the T7 pedicle into the posterior one-third at T7. This was then exchanged for a Kyphon advanced osteo introducer system comprised of a working cannula and a Kyphon osteo drill. This combination was then advanced over a Kyphon osteo bone pin until the  tip of the Kyphon osteo drill was in the posterior third at T7. At this time, the bone pin was removed. In a medial trajectory, the combination was advanced until the tip of the working cannula was inside the posterior one-third at T7. Using the same technique, a second 11 gauge trocar needle was advanced through the left T7 pedicle in positioned in the posterior 1/3 of the T7 vertebral body. The osteo drill was again advanced into the anterior 1/3 at T7 and then removed. Through the working cannula, a Kyphon inflatable bone tamps 15 x 2 was advanced and positioned with the distal marker 5 mm from the anterior aspect of T7. The balloons were well-positioned on the AP projection. At this time, the balloons were expanded using contrast via a Kyphon inflation syringe device via microtubing. Inflations were continued until there was apposition with the superior and the inferior endplates. At this time, methylmethacrylate mixture was reconstituted with Tobramycin in the Kyphon bone mixing device system. The balloons were deflated and removed followed by installation of methylmethacrylate mixture at T7 with excellent filling in the AP and lateral projections. No extravasation was noted in the disk spaces or posteriorly into the spinal canal. No epidural venous contamination was seen. The working cannula and the bone filler were then retrieved and removed. IMPRESSION: 1. Status post vertebral body augmentation using balloon kyphoplasty at T7 as described without event. Signed, Sterling Big, MD, RPVI [facility] Radiology Electronically Signed   By: Malachy Moan M.D.   On: 11/20/2018 12:44        Scheduled Meds: . acetaminophen  500 mg Oral TID  . aspirin  81 mg Oral Daily  . carvedilol  6.25 mg Oral BID WC  . cholecalciferol  1,000 Units Oral Daily  . enoxaparin (LOVENOX) injection  40 mg Subcutaneous Q24H  . insulin aspart  0-15 Units Subcutaneous  TID WC  . insulin aspart  0-5 Units Subcutaneous QHS  . insulin glargine  6 Units Subcutaneous Daily  . iron polysaccharides  150 mg Oral Daily  . lidocaine  1 patch Transdermal Q24H  .  losartan  25 mg Oral Daily  . polyethylene glycol  17 g Oral BID  . rosuvastatin  20 mg Oral q1800  . senna-docusate  1 tablet Oral BID   Continuous Infusions: . sodium chloride 1,000 mL (11/19/18 2142)     LOS: 9 days    Time spent: 35 mins    Ramiro Harvest, MD [facility] Pager [phone]  If 7PM-7AM, please contact night-coverage [email or web address] Password [email or web address] 11/21/2018, 11:34 AM

## 2018-11-21 NOTE — Progress Notes (Signed)
Referring Physician(s): Thompson,D  Supervising Physician: Malachy Moan  Patient Status:  [facility] - In-pt  Chief Complaint:  Back pain,T7 fracture  Subjective: Pt doing ok; no family in room; says her back pain has improved some ;able to turn in bed without severe pain; moving all fours ok   Allergies: Patient has no known allergies.  Medications: Prior to Admission medications   Medication Sig Start Date End Date Taking? Authorizing Provider  aspirin 81 MG chewable tablet Chew 81 mg by mouth daily.   Yes [provider]  carvedilol (COREG) 6.25 MG tablet Take 1 tablet (6.25 mg total) by mouth 2 (two) times daily with a meal. 01/02/17  Yes Sharl Ma, Sarina Ill, MD  cephALEXin (KEFLEX) 500 MG capsule Take 1 capsule (500 mg total) by mouth 2 (two) times daily. 11/03/18  Yes Garlon Hatchet, PA-C  cholecalciferol (VITAMIN D3) 25 MCG (1000 UT) tablet Take 1,000 Units by mouth daily.   Yes [provider]  clopidogrel (PLAVIX) 75 MG tablet Take 1 tablet (75 mg total) by mouth daily. DO NOT START UNTIL FINISHED WITH BRILINTA 08/14/18 08/14/19 Yes Weaver, Scott T, PA-C  HYDROcodone-acetaminophen (NORCO/VICODIN) 5-325 MG tablet Take 1 tablet by mouth every 6 (six) hours as needed. Patient taking differently: Take 1 tablet by mouth 3 (three) times daily.  01/02/17  Yes Meredeth Ide, MD  iron polysaccharides (NIFEREX) 150 MG capsule Take 1 capsule (150 mg total) by mouth daily. 09/21/13  Yes Georgann Housekeeper, MD  losartan (COZAAR) 25 MG tablet Take 1 tablet (25 mg total) by mouth daily. 09/03/18  Yes Weaver, Scott T, PA-C  metFORMIN (GLUCOPHAGE) 500 MG tablet Take 500 mg by mouth daily with breakfast. with food 07/31/18  Yes [provider]  rosuvastatin (CRESTOR) 20 MG tablet Take 1 tablet (20 mg total) by mouth daily at 6 PM. 01/02/17  Yes Sharl Ma, Sarina Ill, MD  acetaminophen (TYLENOL) 500 MG tablet Take 1,000 mg by mouth every 6 (six) hours as needed for mild pain.    [provider]  cholecalciferol 4000 units TABS Take 4,000 Units by mouth daily. Patient not taking: Reported on 11/12/2018 01/02/17   Meredeth Ide, MD     Vital Signs: BP (!) 158/63 (BP Location: Right Arm)   Pulse 65   Temp 98.1 F (36.7 C) (Oral)   Resp 19   Ht 4\' 9"  (1.448 m)   Wt 164 lb 3.9 oz (74.5 kg)   SpO2 92%   BMI 35.54 kg/m   Physical Exam awake/alert; puncture site T7 region clean and dry, not sig tender, moving all fours ok  Imaging: Ir Kypho Thoracic With Bone Biopsy  Result Date: 11/20/2018 INDICATION: 77 year old female with symptomatic T7 compression fracture. EXAM: Prior MRI thoracic and lumbar spine COMPARISON:  Prior MRI thoracic and lumbar spine 11/12/2018 MEDICATIONS: As antibiotic prophylaxis, 2 g Ancef was ordered pre-procedure and administered intravenously within 1 hour of incision. ANESTHESIA/SEDATION: Moderate (conscious) sedation was employed during this procedure. A total of Versed 3 mg and Fentanyl 150 mcg was administered intravenously. Moderate Sedation Time: 41 minutes. The patient's level of consciousness and vital signs were monitored continuously by radiology nursing throughout the procedure under my direct supervision. FLUOROSCOPY TIME:  Fluoroscopy Time: 11 minutes 36 seconds (475 mGy) COMPLICATIONS: None immediate. PROCEDURE: Following a full explanation of the procedure along with the potential associated complications, an informed witnessed consent was obtained. The patient was placed prone on the fluoroscopic table. The skin overlying the thoracic  region was then prepped and draped in the usual sterile fashion. The right pedicle at T7 was then infiltrated with 0.25% bupivacaine followed by the advancement of an 11-gauge Jamshidi needle through the T7 pedicle into the posterior one-third at T7. This was then exchanged for a Kyphon advanced osteo introducer system comprised of a working cannula and a Kyphon osteo drill. This combination was then  advanced over a Kyphon osteo bone pin until the tip of the Kyphon osteo drill was in the posterior third at T7. At this time, the bone pin was removed. In a medial trajectory, the combination was advanced until the tip of the working cannula was inside the posterior one-third at T7. Using the same technique, a second 11 gauge trocar needle was advanced through the left T7 pedicle in positioned in the posterior 1/3 of the T7 vertebral body. The osteo drill was again advanced into the anterior 1/3 at T7 and then removed. Through the working cannula, a Kyphon inflatable bone tamps 15 x 2 was advanced and positioned with the distal marker 5 mm from the anterior aspect of T7. The balloons were well-positioned on the AP projection. At this time, the balloons were expanded using contrast via a Kyphon inflation syringe device via microtubing. Inflations were continued until there was apposition with the superior and the inferior endplates. At this time, methylmethacrylate mixture was reconstituted with Tobramycin in the Kyphon bone mixing device system. The balloons were deflated and removed followed by installation of methylmethacrylate mixture at T7 with excellent filling in the AP and lateral projections. No extravasation was noted in the disk spaces or posteriorly into the spinal canal. No epidural venous contamination was seen. The working cannula and the bone filler were then retrieved and removed. IMPRESSION: 1. Status post vertebral body augmentation using balloon kyphoplasty at T7 as described without event. Signed, Sterling Big, MD, RPVI [facility] Electronically Signed   By: Malachy Moan M.D.   On: 11/20/2018 12:44    Labs:  CBC: Recent Labs    11/17/18 0403 11/18/18 0357 11/20/18 0448 11/21/18 0546  WBC 9.0 8.3 6.0 5.6  HGB 12.7 12.4 11.5* 12.0  HCT 39.8 38.1 36.1 37.7  PLT 273 268 300 284    COAGS: Recent Labs     11/12/18 1230 11/16/18 1519  INR 0.97 0.90    BMP: Recent Labs    11/18/18 0357 11/19/18 0426 11/20/18 0448 11/21/18 0546  NA 137 140 138 138  K 3.5 4.0 4.1 3.9  CL 103 106 104 104  CO2 24 26 25 24   GLUCOSE 167* 89 110* 125*  BUN 10 10 15 10   CALCIUM 8.3* 8.5* 8.5* 8.3*  CREATININE 0.38* 0.48 0.61 0.51  GFRNONAA >60 >60 >60 >60  GFRAA >60 >60 >60 >60    LIVER FUNCTION TESTS: Recent Labs    11/15/18 0558 11/16/18 0512 11/17/18 0403 11/18/18 0357  BILITOT 0.8 0.9 0.9 0.7  AST 15 14* 14* 14*  ALT 16 14 14 13   ALKPHOS 52 57 56 52  PROT 6.3* 6.1* 6.5 6.3*  ALBUMIN 2.8* 2.8* 2.9* 2.8*    Assessment and Plan: Pt with hx back pain,T7 fracture; s/p T7 KP 12/13; afebrile; WBC nl; cont PT   Electronically Signed: D. Jeananne Rama, PA-C 11/21/2018, 9:43 AM   I spent a total of 15 minutes at the the patient's bedside AND on the patient's hospital floor or unit, greater than 50% of which was counseling/coordinating care for T7  kyphoplasty    Patient ID: Amy Hood, female   DOB: 01-Jan-1941, 77 y.o.   MRN: [id number]

## 2018-11-22 LAB — HEMOGLOBIN AND HEMATOCRIT, BLOOD
HCT: 37.4 % (ref 36.0–46.0)
Hemoglobin: 11.8 g/dL — ABNORMAL LOW (ref 12.0–15.0)

## 2018-11-22 LAB — GLUCOSE, CAPILLARY
Glucose-Capillary: 137 mg/dL — ABNORMAL HIGH (ref 70–99)
Glucose-Capillary: 157 mg/dL — ABNORMAL HIGH (ref 70–99)

## 2018-11-22 MED ORDER — OXYCODONE HCL 5 MG PO TABS
5.0000 mg | ORAL_TABLET | Freq: Three times a day (TID) | ORAL | Status: DC
  Administered 2018-11-22 – 2018-11-23 (×3): 5 mg via ORAL
  Filled 2018-11-22 (×3): qty 1

## 2018-11-22 NOTE — Progress Notes (Signed)
PROGRESS NOTE    Amy Hood  XLK:[id number] DOB: November 11, 1941 DOA: 11/12/2018 PCP: Georgann Housekeeper, MD    Brief Narrative:  HPI per Dr. Erin Hearing on 11/12/18 Amy Hood a 77 y.o.femalewith medical history significant ofchronic arthritis, coronary artery disease, atrial fibrillation, dyslipidemia, hypertension,andtype 2 diabetes mellitus. Patient presents with severe lower back pain, sharp in nature, severe in intensity, worse with movement, no improving factors,associated ambulatory dysfunction,poor appetite,and confusion. Patient was seen November 25 in the emergency department for the same complaint, she was diagnosed with a urinary tract infection and was prescribed cephalexin. She did complete acourse of oral antibiotics at home with no improvement of her symptoms.   For the last 10 days patient has been in bedridden due to severe back pain, had difficulty seatingthat has affected her p.o. Intake. Patient usually ambulates with a walker.She takes opiates for pain control.  All the information is obtained through her daughter due to patient's language barriers.  **She speaks [place] and friend interpreted again along with our Armed forces technical officer.  Patient states again that her back pain is an 8/10 in severity and worse with ambulating and working with Therapy.  I have called IR for possible vertebroplasty given her acute back pain and they feel that she would benefit if her symptoms correlate clinically. Upon palpation of her thoracic spine she had severe pain and is not complaining of low back pain now but complaining of pain in her sides of her ribs. IR wanting patient to undergo repeat U/A and Urine Cx which they have ordered.    Assessment & Plan:   Principal Problem:   Compression fracture of T6 vertebra (HCC) Active Problems:   PAF (paroxysmal atrial fibrillation) (HCC)   Type 2 diabetes mellitus without complication (HCC)   Hyperglycemia   Lactic acid  acidosis   CAD S/P percutaneous coronary angioplasty  1 severe back pain secondary to T6 compression fracture complicated by metabolic encephalopathy Metabolic encephalopathy improved. Patient with complaints of back pain in the upper thoracic area with tenderness to palpation with radiation bilaterally prior to kyphoplasty.  Patient with complaints of significant pain the morning of 11/19/2018, after going to the bathroom.  Patient noted to have failed outpatient treatment. -MRI of the T-spine done showed acute T6 compression fracture with 50% height loss with bony retropulsion resulting in mild to moderate spinal stenosis no other acute abnormalities.  Chronic compression fractures noted in the L-spine with prior augmentation of L1, L2, L4 and L5 vertebral bodies.  Multilevel degenerative spondylosis with resultant moderate spinal stenosis at L1-L2, L3-L4, L4-L5.  Moderate right L3, bilateral L4 and left L5 foraminal narrowing with bulging disc and facet hypertrophy. -IR consulted for vertebroplasty.  Plavix resumed today. -Patient with ongoing pain. -Patient was to be transferred to [facility] for vertebroplasty.  Per IR, PA patient able to have vertebroplasty at [facility] long on 11/20/2018 and as such transfer was canceled.  -Discontinued Vicodin and placed on oxycodone every 4 hours as needed.  Continue IV morphine as needed.  Continue scheduled Tylenol 500 mg 3 times daily.  Lidoderm patch was placed on 11/19/2018.   -Patient s/p T7 kyphoplasty 11/20/2018.  -Patient still with complaints of pain however it is noted that patient has not received any pain medications postoperatively and was placed on oxycodone 5 mg 4 times daily scheduled as patient was not asking for pain medication as needed likely due to language barrier.   -Change oxycodone to 5 mg every 8 hours scheduled as per daughter patient  with some increased drowsiness and increased sleep.  PT/OT reevaluation pending  postoperatively. -Urinalysis and urine cultures negative.  Patient asymptomatic.   -Follow.   2.  Type 2 diabetes mellitus with uncontrolled hyperglycemia Hemoglobin A1c 8.0.  CBG was 137 this morning.  Continue to hold oral hypoglycemic agents.  Continue SSI.  Patient diet has been resumed.  Continue Lantus 6 units daily as well as sliding scale insulin for now.  May uptitrate Lantus to 12 units as needed for better CBG control.  Monitor CBG for now.   3.  Coronary artery disease status post stenting Asymptomatic.  Stable.  Plavix has been was for vertebroplasty/kyphoplasty which was done the evening of 11/20/2018.  Resume Plavix today.  Continue aspirin and statin.    4.  Hypertension Stable.  Continue current regimen of Coreg.   5.  Hypernatremia Resolved with D5W.  IV fluids have been saline locked.   6.  Constipation Improved on current bowel regimen.  Continue current bowel regimen of MiraLAX and Senokot.   7.  Leukocytosis Reactive.  Resolved.    8.  Lactic acidosis Improved.  Patient noted to be on metformin in the presence of dehydration and poor oral intake.  Follow.    DVT prophylaxis: Lovenox Code Status: Full Family Communication: Updated patient, and daughter at bedside.  Disposition Plan: Pending PT evaluation post procedure.  Likely skilled nursing facility versus home with home health.    Consultants:   Interventional radiology  Procedures:   Chest x-ray 11/12/2018  MRI T-spine L-spine 11/12/2018  T7 kyphoplasty per Dr. Archer AsaMcCullough 11/20/2018  Antimicrobials:   None   Subjective: Patient laying in bed.  Patient states still with back pain.  Patient however states when she goes to the bathroom and sits on the toilet pain is a little bit improved.  Per daughter patient more sleepy and concerned may be secondary to pain medication.  No chest pain.  No shortness of breath.  Objective: Vitals:   11/21/18 1310 11/21/18 2035 11/21/18 2317 11/22/18 0509   BP: 131/69 (!) 92/38 (!) 126/53 137/60  Pulse: 62 (!) 58 (!) 56 (!) 58  Resp: 19 18  17   Temp: 98 F (36.7 C) 98.3 F (36.8 C)  98.2 F (36.8 C)  TempSrc: Oral Oral  Oral  SpO2: 94% 92%  92%  Weight:      Height:        Intake/Output Summary (Last 24 hours) at 11/22/2018 1311 Last data filed at 11/22/2018 0500 Gross per 24 hour  Intake 480 ml  Output 1350 ml  Net -870 ml   Filed Weights   11/12/18 1257 11/16/18 0545  Weight: 73.2 kg 74.5 kg    Examination:  General exam: NAD  Respiratory system: CTAB.  No wheezes, no crackles, no rhonchi.  Cardiovascular system: RRR no m/r/g. No JVD.  No lower extremity edema.  Gastrointestinal system: Abdomen is soft, nontender, nondistended, positive bowel sounds.  No rebound.  No guarding.  Central nervous system: Alert and oriented. No focal neurological deficits. Extremities: Symmetric 5 x 5 power. Skin: No rashes, lesions or ulcers Psychiatry: Judgement and insight appear normal. Mood & affect appropriate.     Data Reviewed: I have personally reviewed following labs and imaging studies  CBC: Recent Labs  Lab 11/16/18 0512 11/17/18 0403 11/18/18 0357 11/20/18 0448 11/21/18 0546 11/22/18 0541  WBC 8.2 9.0 8.3 6.0 5.6  --   NEUTROABS 5.8 6.9 6.0  --  3.8  --   HGB 12.8 12.7  12.4 11.5* 12.0 11.8*  HCT 40.2 39.8 38.1 36.1 37.7 37.4  MCV 82.9 81.9 81.6 81.5 81.4  --   PLT 264 273 268 300 284  --    Basic Metabolic Panel: Recent Labs  Lab 11/16/18 0512 11/17/18 0403 11/18/18 0357 11/19/18 0426 11/20/18 0448 11/21/18 0546  NA 134* 135 137 140 138 138  K 3.8 3.6 3.5 4.0 4.1 3.9  CL 99 101 103 106 104 104  CO2 26 27 24 26 25 24   GLUCOSE 204* 185* 167* 89 110* 125*  BUN 12 11 10 10 15 10   CREATININE 0.51 0.53 0.38* 0.48 0.61 0.51  CALCIUM 8.4* 8.5* 8.3* 8.5* 8.5* 8.3*  MG 2.3 2.1 2.2  --   --   --   PHOS 3.1 3.1 2.6  --   --   --    GFR: Estimated Creatinine Clearance: 48.5 mL/min (by C-G formula based on SCr  of 0.51 mg/dL). Liver Function Tests: Recent Labs  Lab 11/16/18 0512 11/17/18 0403 11/18/18 0357  AST 14* 14* 14*  ALT 14 14 13   ALKPHOS 57 56 52  BILITOT 0.9 0.9 0.7  PROT 6.1* 6.5 6.3*  ALBUMIN 2.8* 2.9* 2.8*   No results for input(s): LIPASE, AMYLASE in the last 168 hours. No results for input(s): AMMONIA in the last 168 hours. Coagulation Profile: Recent Labs  Lab 11/16/18 1519  INR 0.90   Cardiac Enzymes: No results for input(s): CKTOTAL, CKMB, CKMBINDEX, TROPONINI in the last 168 hours. BNP (last 3 results) No results for input(s): PROBNP in the last 8760 hours. HbA1C: No results for input(s): HGBA1C in the last 72 hours. CBG: Recent Labs  Lab 11/21/18 0748 11/21/18 1137 11/21/18 1712 11/22/18 0810 11/22/18 1209  GLUCAP 130* 140* 83 137* 157*   Lipid Profile: No results for input(s): CHOL, HDL, LDLCALC, TRIG, CHOLHDL, LDLDIRECT in the last 72 hours. Thyroid Function Tests: No results for input(s): TSH, T4TOTAL, FREET4, T3FREE, THYROIDAB in the last 72 hours. Anemia Panel: No results for input(s): VITAMINB12, FOLATE, FERRITIN, TIBC, IRON, RETICCTPCT in the last 72 hours. Sepsis Labs: No results for input(s): PROCALCITON, LATICACIDVEN in the last 168 hours.  Recent Results (from the past 240 hour(s))  Urine Culture     Status: None   Collection Time: 11/16/18  1:25 PM  Result Value Ref Range Status   Specimen Description   Final    URINE, CLEAN CATCH Performed at [facility], [address], [place], [place] [address]    Special Requests   Final    NONE Performed at [facility], [address] [place], [place] [address]    Culture   Final    NO GROWTH Performed at [facility], [address], [place], [place] [address]    Report Status 11/18/2018 FINAL  Final         Radiology Studies: No results found.      Scheduled Meds: . acetaminophen  500 mg Oral TID  . aspirin  81 mg Oral Daily   . carvedilol  6.25 mg Oral BID WC  . cholecalciferol  1,000 Units Oral Daily  . clopidogrel  75 mg Oral Daily  . enoxaparin (LOVENOX) injection  40 mg Subcutaneous Q24H  . insulin aspart  0-15 Units Subcutaneous TID WC  . insulin aspart  0-5 Units Subcutaneous QHS  . insulin glargine  6 Units Subcutaneous Daily  . iron polysaccharides  150 mg Oral Daily  . lidocaine  1 patch  Transdermal Q24H  . losartan  25 mg Oral Daily  . oxyCODONE  5 mg Oral Q8H  . polyethylene glycol  17 g Oral BID  . rosuvastatin  20 mg Oral q1800  . senna-docusate  1 tablet Oral BID   Continuous Infusions: . sodium chloride 1,000 mL (11/19/18 2142)     LOS: 10 days    Time spent: 35 mins    Ramiro Harvest, MD [facility] Pager [phone]  If 7PM-7AM, please contact night-coverage [email or web address] Password [email or web address] 11/22/2018, 1:11 PM

## 2018-11-22 NOTE — Evaluation (Signed)
Physical Therapy Evaluation Patient Details Name: Amy Hood MRN: [id number] DOB: 08-30-1941 Today's Date: 11/22/2018   History of Present Illness  Patient is a 77 year old female admitted 11/12/2018 with diagnosis of compression fracture of T6. PMH: paroxysmal a fib, T2DM, hyperglydemia, lactic acid acidosis, CAD s/p percutaneous coronary angioplasty, chronic arthritis, dyslipidemia. Patient usually ambulates with a walker. Language barriers. IR kyphoplasty with bone biopsy at T7 11/20/2018.    Clinical Impression  Patient performed quite well, nearing her baseline prior to admission. Patient exhibited less pain complaints today with bed mobility, transfers and ambulation with RW and required less assistance as well. PT continues with recommendation of HHPT to continue strengthening patient, improve balance and improve quality of life.  Patient would continue to benefit from skilled physical therapy in current environment and next venue to continue return to prior function and increase strength, endurance, balance, coordination, and functional mobility and gait skills.      Follow Up Recommendations Home health PT;Supervision for mobility/OOB    Equipment Recommendations  None recommended by PT    Recommendations for Other Services       Precautions / Restrictions Restrictions Weight Bearing Restrictions: No      Mobility  Bed Mobility Overal bed mobility: Needs Assistance Bed Mobility: Rolling;Sidelying to Sit Rolling: Supervision Sidelying to sit: Supervision       General bed mobility comments: Pt's daughter assisted in verbal cuing of pt to participate in log rolling for spine involvement.  Transfers Overall transfer level: Needs assistance Equipment used: Rolling walker (2 wheeled) Transfers: Sit to/from [employer] Sit to Stand: Supervision;Min guard Stand pivot transfers: Supervision;Min guard       General transfer comment: Min guard for  safety.  Ambulation/Gait Ambulation/Gait assistance: Min guard Gait Distance (Feet): 1000 Feet Assistive device: Rolling walker (2 wheeled) Gait Pattern/deviations: Step-through pattern;Decreased step length - right;Decreased step length - left;Decreased stride length Gait velocity: slightly decreased   General Gait Details: min guard to supervision for safety. Communication assist from daughter. Patient exhibit good endurance for ambulation with some fatigue noted at end of ambulation with RW.  Stairs            Wheelchair Mobility    Modified Rankin (Stroke Patients Only)       Balance Overall balance assessment: Modified Independent(with RW for weight bearing activities)                                           Pertinent Vitals/Pain Pain Score: 5  Pain Location: back Pain Descriptors / Indicators: Guarding;Sore Pain Intervention(s): Limited activity within patient's tolerance;Monitored during session;Repositioned    Home Living                        Prior Function                 Hand Dominance        Extremity/Trunk Assessment        Lower Extremity Assessment Lower Extremity Assessment: Overall WFL for tasks assessed    Cervical / Trunk Assessment Cervical / Trunk Assessment: Normal  Communication      Cognition Arousal/Alertness: Awake/alert Behavior During Therapy: WFL for tasks assessed/performed Overall Cognitive Status: Within Functional Limits for tasks assessed  General Comments: pt follows tactile cuing from PT, and verbal cuing from daughter at bedside      General Comments      Exercises     Assessment/Plan    PT Assessment Patient needs continued PT services  PT Problem List Decreased activity tolerance;Decreased balance;Decreased mobility;Pain       PT Treatment Interventions DME instruction;Gait training;Therapeutic exercise;Therapeutic  activities;Patient/family education    PT Goals (Current goals can be found in the Care Plan section)  Acute Rehab PT Goals Patient Stated Goal: Go home with HHPT to get stronger; less pain PT Goal Formulation: With patient/family Time For Goal Achievement: 12/06/18 Potential to Achieve Goals: Good    Frequency Min 3X/week   Barriers to discharge        Co-evaluation               AM-PAC PT "6 Clicks" Mobility  Outcome Measure Help needed turning from your back to your side while in a flat bed without using bedrails?: A Little Help needed moving from lying on your back to sitting on the side of a flat bed without using bedrails?: A Little Help needed moving to and from a bed to a chair (including a wheelchair)?: A Little Help needed standing up from a chair using your arms (e.g., wheelchair or bedside chair)?: A Little Help needed to walk in hospital room?: A Little Help needed climbing 3-5 steps with a railing? : A Lot 6 Click Score: 17    End of Session Equipment Utilized During Treatment: Gait belt(Pt with SpO2 of 91% after ambulation, O2 reapplied) Activity Tolerance: Patient tolerated treatment well Patient left: with call bell/phone within reach;with family/visitor present;in bed(pt's daughter and daughter's friend at bedside) Nurse Communication: Mobility status PT Visit Diagnosis: Difficulty in walking, not elsewhere classified (R26.2);Pain Pain - part of body: (back)    Time: [id number] PT Time Calculation (min) (ACUTE ONLY): 30 min   Charges:   PT Evaluation $PT Re-evaluation: 1 Re-eval PT Treatments $Gait Training: 8-22 mins        Katina DungBarbara D. Hartnett-Rands, MS, PT [facility] #[id number] 11/22/2018, 3:48 PM

## 2018-11-23 DIAGNOSIS — E860 Dehydration: Secondary | ICD-10-CM

## 2018-11-23 DIAGNOSIS — S22060A Wedge compression fracture of T7-T8 vertebra, initial encounter for closed fracture: Secondary | ICD-10-CM

## 2018-11-23 LAB — GLUCOSE, CAPILLARY
Glucose-Capillary: 110 mg/dL — ABNORMAL HIGH (ref 70–99)
Glucose-Capillary: 131 mg/dL — ABNORMAL HIGH (ref 70–99)
Glucose-Capillary: 139 mg/dL — ABNORMAL HIGH (ref 70–99)
Glucose-Capillary: 150 mg/dL — ABNORMAL HIGH (ref 70–99)
Glucose-Capillary: 153 mg/dL — ABNORMAL HIGH (ref 70–99)

## 2018-11-23 LAB — BASIC METABOLIC PANEL
Anion gap: 10 (ref 5–15)
BUN: 8 mg/dL (ref 8–23)
CO2: 24 mmol/L (ref 22–32)
Calcium: 8.6 mg/dL — ABNORMAL LOW (ref 8.9–10.3)
Chloride: 106 mmol/L (ref 98–111)
Creatinine, Ser: 0.51 mg/dL (ref 0.44–1.00)
GFR calc Af Amer: >60 mL/min (ref >60)
Glucose, Bld: 154 mg/dL — ABNORMAL HIGH (ref 70–99)
Potassium: 3.9 mmol/L (ref 3.5–5.1)
Sodium: 140 mmol/L (ref 135–145)

## 2018-11-23 LAB — CBC
HCT: 38.7 % (ref 36.0–46.0)
Hemoglobin: 12.1 g/dL (ref 12.0–15.0)
MCH: 25.5 pg — ABNORMAL LOW (ref 26.0–34.0)
MCHC: 31.3 g/dL (ref 30.0–36.0)
MCV: 81.5 fL (ref 80.0–100.0)
Platelets: 290 10*3/uL (ref 150–400)
RBC: 4.75 MIL/uL (ref 3.87–5.11)
RDW: 13.2 % (ref 11.5–15.5)
WBC: 4.4 10*3/uL (ref 4.0–10.5)
nRBC: 0 % (ref 0.0–0.2)

## 2018-11-23 MED ORDER — OXYCODONE HCL 5 MG PO TABS: 5.0000 mg | ORAL_TABLET | ORAL | 0 refills | Status: DC | PRN

## 2018-11-23 MED ORDER — LIDOCAINE 5 % EX PTCH: 1.0000 | MEDICATED_PATCH | CUTANEOUS | 0 refills | Status: DC

## 2018-11-23 MED ORDER — SENNOSIDES-DOCUSATE SODIUM 8.6-50 MG PO TABS: 1.0000 | ORAL_TABLET | Freq: Every day | ORAL | Status: AC

## 2018-11-23 MED ORDER — ACETAMINOPHEN 500 MG PO TABS: 500.0000 mg | ORAL_TABLET | Freq: Three times a day (TID) | ORAL | 0 refills | Status: AC

## 2018-11-23 MED ORDER — POLYETHYLENE GLYCOL 3350 17 G PO PACK: 17.0000 g | PACK | Freq: Two times a day (BID) | ORAL | 0 refills | Status: AC

## 2018-11-23 NOTE — Discharge Summary (Signed)
Physician Discharge Summary  Amy Hood ZOX:[id number] DOB: 30-Jul-1941 DOA: 11/12/2018  PCP: Georgann Housekeeper, MD  Admit date: 11/12/2018 Discharge date: 11/23/2018  Time spent: 50 minutes  Recommendations for Outpatient Follow-up:  1. Follow-up with Georgann Housekeeper, MD in 2 weeks.  On follow-up patient will need a basic metabolic profile done to follow-up on electrolytes and renal function.  Patient's T6 compression fracture status post kyphoplasty will need to be reassessed on follow-up.   Discharge Diagnoses:  Principal Problem:   Compression fracture of T7 vertebra (HCC) Active Problems:   Compression fracture of T6 vertebra (HCC)   PAF (paroxysmal atrial fibrillation) (HCC)   Type 2 diabetes mellitus without complication (HCC)   Hyperglycemia   Lactic acid acidosis   CAD S/P percutaneous coronary angioplasty   Dehydration   Discharge Condition: Stable and improved  Diet recommendation: Heart healthy  Filed Weights   11/12/18 1257 11/16/18 0545  Weight: 73.2 kg 74.5 kg    History of present illness:  Per Dr. Larene Hood is a 77 y.o. female with medical history significant of chronic arthritis, coronary artery disease, atrial fibrillation, dyslipidemia, hypertension, and type 2 diabetes mellitus.  Patient presented with severe lower back pain, sharp in nature, severe in intensity, worse with movement, no improving factors, associated ambulatory dysfunction, poor appetite, and confusion.  Patient was seen November 25 in the emergency department for the same complaint, she was diagnosed with a urinary tract infection and was prescribed cephalexin.  She did complete a course of oral antibiotics at home with no improvement of her symptoms.   For the last 10 days patient had been bedridden due to severe back pain, had difficulty seating that has affected her p.o. Intake. Patient usually ambulates with a walker.  She takes opiates for pain control.  All the information is  obtained through her daughter due to patient's language barriers.    ED Course: Patient was found ill looking appearing, deconditioned, hyperglycemic and somnolent.  Received IV fluids, IV antibiotics and referred for admission.  Hospital Course:  1 severe back pain secondary to T7 compression fracture complicated by metabolic encephalopathy Metabolic encephalopathy improved with better pain management. Patient with complaints of back pain in the upper thoracic area with tenderness to palpation with radiation bilaterally prior to kyphoplasty.  Patient with complaints of significant pain the morning of 11/19/2018, after going to the bathroom.  Patient noted to have failed outpatient treatment. -MRI of the T-spine done showed acute T6 compression fracture with 50% height loss with bony retropulsion resulting in mild to moderate spinal stenosis no other acute abnormalities.  Chronic compression fractures noted in the L-spine with prior augmentation of L1, L2, L4 and L5 vertebral bodies.  Multilevel degenerative spondylosis with resultant moderate spinal stenosis at L1-L2, L3-L4, L4-L5.  Moderate right L3, bilateral L4 and left L5 foraminal narrowing with bulging disc and facet hypertrophy. -IR consulted for vertebroplasty.  Plavix resumed today. -Patient with ongoing pain. -Patient was to be transferred to [facility] for vertebroplasty.  Per IR, PA patient able to have vertebroplasty at [facility] long on 11/20/2018 and as such transfer was canceled.  -Discontinued Vicodin and placed on oxycodone every 4 hours as needed.  Patient also placed on IV morphine as needed.  Patient was started on scheduled Tylenol 500 mg 3 times daily as well as a Lidoderm patch which was placed 11/19/2018 for better pain control. -Patient s/p T7 kyphoplasty 11/20/2018.  -Patient still with complaints of pain however it was noted that patient  had not received any pain medications postoperatively and was placed on oxycodone 5 mg  4 times daily scheduled as patient was not asking for pain medication as needed likely due to language barrier.   -Changed oxycodone to 5 mg every 8 hours scheduled as per daughter patient with some increased drowsiness and increased sleep.  PT/OT evaluated patient during the hospitalization.  -Urinalysis and urine cultures negative.  Patient asymptomatic.   -Post kyphoplasty patient improved slowly clinically during the hospitalization was able to ambulate in the hallways with physical therapy.  Patient was discharged home on oxycodone 5 mg every 8 hours x2 days and then every 4 hours as needed for breakthrough pain.  Patient be discharged home in stable and improved condition and is to follow-up with PCP in the outpatient setting.  2.  Type 2 diabetes mellitus with uncontrolled hyperglycemia Hemoglobin A1c 8.0.    Patient's oral hypoglycemic agents were held throughout the hospitalization and patient maintained on sliding scale insulin.  Patient was also subsequently started on low-dose Lantus and 6 units daily for better blood glucose control.  Patient be discharged home back on home regimen of oral hypoglycemic agents.  Outpatient follow-up with PCP.  3.  Coronary artery disease status post stenting Asymptomatic.  Stable.  Plavix was  held for vertebroplasty/kyphoplasty which was done the evening of 11/20/2018.    Plavix was subsequently resumed 24 hours postoperatively.  Patient maintained on home regimen of aspirin and statin.  Outpatient follow-up with cardiology as previously scheduled.  4.  Hypertension Stable.  Patient maintained on home regimen of Coreg.  ARB was held and will be resumed on discharge.  Outpatient follow-up with PCP.   5.  Hypernatremia Resolved with D5W.  IV fluids have been saline locked.   6.  Constipation Patient placed on a bowel regimen of MiraLAX and Senokot.  Patient was having good bowel movements during the hospitalization.  Patient was discharged home on  MiraLAX and Senokot.  Outpatient follow-up with PCP.   7.  Leukocytosis Reactive.  Resolved.    8.  Lactic acidosis Improved.  Patient noted to be on metformin in the presence of dehydration and poor oral intake.    Outpatient follow-up.     Procedures:  Chest x-ray 11/12/2018  MRI T-spine L-spine 11/12/2018  T7 kyphoplasty per Dr. Archer AsaMcCullough 11/20/2018  Consultations:  Interventional radiology  Discharge Exam: Vitals:   11/22/18 2109 11/23/18 0434  BP: (!) 149/68 (!) 145/71  Pulse: 60 66  Resp: 17 16  Temp: 98.1 F (36.7 C) 97.9 F (36.6 C)  SpO2: 92% 97%    General: NAD Cardiovascular: RRR Respiratory: CTAB  Discharge Instructions   Discharge Instructions    Diet - low sodium heart healthy   Complete by:  As directed    Increase activity slowly   Complete by:  As directed      Allergies as of 11/23/2018   No Known Allergies     Medication List    STOP taking these medications   cephALEXin 500 MG capsule Commonly known as:  KEFLEX   HYDROcodone-acetaminophen 5-325 MG tablet Commonly known as:  NORCO/VICODIN     TAKE these medications   acetaminophen 500 MG tablet Commonly known as:  TYLENOL Take 1,000 mg by mouth every 6 (six) hours as needed for mild pain. What changed:  Another medication with the same name was added. Make sure you understand how and when to take each.   acetaminophen 500 MG tablet Commonly known as:  TYLENOL Take 1 tablet (500 mg total) by mouth 3 (three) times daily for 5 days. What changed:  You were already taking a medication with the same name, and this prescription was added. Make sure you understand how and when to take each.   aspirin 81 MG chewable tablet Chew 81 mg by mouth daily.   carvedilol 6.25 MG tablet Commonly known as:  COREG Take 1 tablet (6.25 mg total) by mouth 2 (two) times daily with a meal.   cholecalciferol 25 MCG (1000 UT) tablet Commonly known as:  VITAMIN D3 Take 1,000 Units by mouth  daily. What changed:  Another medication with the same name was removed. Continue taking this medication, and follow the directions you see here.   clopidogrel 75 MG tablet Commonly known as:  PLAVIX Take 1 tablet (75 mg total) by mouth daily. DO NOT START UNTIL FINISHED WITH BRILINTA   iron polysaccharides 150 MG capsule Commonly known as:  NIFEREX Take 1 capsule (150 mg total) by mouth daily.   lidocaine 5 % Commonly known as:  LIDODERM Place 1 patch onto the skin daily. Remove & Discard patch within 12 hours or as directed by MD   losartan 25 MG tablet Commonly known as:  COZAAR Take 1 tablet (25 mg total) by mouth daily.   metFORMIN 500 MG tablet Commonly known as:  GLUCOPHAGE Take 500 mg by mouth daily with breakfast. with food   oxyCODONE 5 MG immediate release tablet Commonly known as:  Oxy IR/ROXICODONE Take 1 tablet (5 mg total) by mouth every 4 (four) hours as needed for severe pain. Use 3 times daily for the next 2 days, and then 1 tablet every 4 hours as needed for pain.   polyethylene glycol packet Commonly known as:  MIRALAX / GLYCOLAX Take 17 g by mouth 2 (two) times daily.   rosuvastatin 20 MG tablet Commonly known as:  CRESTOR Take 1 tablet (20 mg total) by mouth daily at 6 PM.   senna-docusate 8.6-50 MG tablet Commonly known as:  Senokot-S Take 1 tablet by mouth at bedtime.            Durable Medical Equipment  (From admission, onward)         Start     Ordered   11/23/18 1143  For home use only DME 3 n 1  Once     11/23/18 1142         No Known Allergies Follow-up Information    Georgann Housekeeper, MD. Schedule an appointment as soon as possible for a visit in 2 week(s).   Specialty:  Internal Medicine Contact information: [address] [place] [address] [phone]        Pricilla Riffle, MD .   Specialty:  Cardiology Contact information: [address] [place] [address] [phone]             The results of significant diagnostics from this hospitalization (including imaging, microbiology, ancillary and laboratory) are listed below for reference.    Significant Diagnostic Studies: Dg Chest 2 View  Result Date: 11/12/2018 CLINICAL DATA:  Labored breathing for several days. EXAM: CHEST - 2 VIEW COMPARISON:  Chest radiograph 07/22/2018 and priors. FINDINGS: The cardiac silhouette is enlarged. Calcific atherosclerotic disease and tortuosity of the aorta. There is no evidence of focal airspace consolidation, pleural effusion or pneumothorax. Mild pulmonary vascular congestion. Compression deformity of T7 vertebral body, new from the most recent lateral chest radiograph dated 12/28/2016.  Soft tissues are grossly normal. IMPRESSION: Enlarged cardiac silhouette with calcific atherosclerotic disease of the aorta and mild pulmonary vascular congestion. New from January 2018 T7 compression fracture with approximately 50% height loss. Electronically Signed   By: Ted Mcalpine M.D.   On: 11/12/2018 13:14   Mr Thoracic Spine Wo Contrast  Addendum Date: 11/20/2018   ADDENDUM REPORT: 11/20/2018 09:46 ADDENDUM: The site of acute thoracic compression fracture was inadvertently mislabeled on the initial interpretation. The T12 vertebral body was skipped. The fractured level is actually T7. Electronically Signed   By: Malachy Moan M.D.   On: 11/20/2018 09:46   Result Date: 11/20/2018 CLINICAL DATA:  Initial evaluation for mid and lower back pain. Evaluate for possible compression fractures. EXAM: MRI THORACIC AND LUMBAR SPINE WITHOUT CONTRAST TECHNIQUE: Multiplanar and multiecho pulse sequences of the thoracic and lumbar spine were obtained without intravenous contrast. COMPARISON:  None available. FINDINGS: MRI THORACIC SPINE FINDINGS Alignment: Mild roto dextroscoliosis of the thoracic spine. Alignment otherwise normal with preservation of the normal thoracic kyphosis. No listhesis.  Vertebrae: There is an acute compression fracture involving the T6 vertebral body. Associated height loss of up to 50% with 4 mm bony retropulsion. This is benign/mechanical in appearance. The fracture appears largely confined to the T6 vertebral body itself, although reactive marrow edema extends into the posterior elements bilaterally. Vertebral body heights otherwise maintained without evidence for acute or chronic fracture. Chronic compression deformities with sequelae of prior vertebral augmentation noted at L1 and L2. Underlying bone marrow signal intensity within normal limits. No discrete or worrisome osseous lesions. No other abnormal marrow edema. Cord: Signal intensity within the thoracic spinal cord is normal. Normal cord caliber and morphology. Conus medullaris terminates at approximately the L2 level. Paraspinal and other soft tissues: Mild paraspinous edema adjacent to the T6 compression fracture. Paraspinous soft tissues demonstrate no other acute finding. Atherosclerotic change noted within the aorta. Scattered T2 hyperintense renal cysts noted bilaterally, largest of which seen on the right and measures 19 mm in diameter. Remainder of the visualized visceral structures otherwise unremarkable. Visualized lungs are grossly clear. Disc levels: T1-2:  Unremarkable. T2-3: Unremarkable. T3-4:  Unremarkable. T4-5:  Unremarkable. T5-6: 4 mm bony retropulsion related to the acute T6 compression fracture. Ventral CSF largely effaced by retropulsed bone. Superimposed mild facet hypertrophy. Dorsal epidural lipomatosis. Resultant mild to moderate spinal stenosis. Foramina remain patent. T6-7:  Epidural lipomatosis with resultant mild spinal stenosis. T7-8: Mild dorsal epidural lipomatosis. No significant spinal stenosis. T8-9:  Unremarkable. T9-10: Mild bilateral facet hypertrophy.  No stenosis. T10-11:  Mild facet hypertrophy.  No stenosis. T11-12:  Mild facet hypertrophy.  No stenosis. MRI LUMBAR SPINE  FINDINGS Segmentation: There appear to be 5 lumbar type vertebral bodies. However, when counting from the dens superiorly there only appear to be 11 type thoracic vertebral bodies. Lowest well-formed disc space will be labeled L5-S1. Alignment: Mild levoscoliosis. Alignment otherwise normal with preservation of the normal lumbar lordosis. No listhesis. Vertebrae: Chronic compression deformity involving the L1 vertebral body with sequelae of prior vertebral augmentation. Associated height loss of up to 50-60 % with 5 mm bony retropulsion. Additional chronic compression deformity involving the L2 vertebral body with sequelae of vertebral augmentation. Associated mild approximate 25% height loss without bony retropulsion. Chronic compression deformity at L4 with up to 40-50% height loss without bony retropulsion. Additional chronic compression deformity at L5 with sequelae of prior vertebral augmentation. Height loss measures up to approximately 40% without bony retropulsion. No acute compression fracture within  the lumbar spine. Underlying bone marrow signal intensity heterogeneous without discrete or worrisome osseous lesions. No abnormal marrow edema. Conus medullaris and cauda equina: Conus extends to the L2 level. Conus and cauda equina appear normal. Paraspinal and other soft tissues: Paraspinous soft tissues demonstrate no acute finding. Chronic fatty atrophy noted within the posterior paraspinous and psoas musculature bilaterally. Atherosclerotic change noted within the aorta. Bilateral renal cyst noted. Disc levels: L1-2: 5 mm bony retropulsion related to the chronic L1 compression fracture. Retropulsed bone indents the ventral thecal sac. Mild facet hypertrophy. Resultant mild to moderate spinal stenosis. Mild bilateral L1 foraminal narrowing. L2-3: Mild disc bulge with disc desiccation. Mild bilateral facet and ligament flavum hypertrophy. No significant spinal stenosis. Mild bilateral L2 foraminal  narrowing, left greater than right. L3-4: Diffuse disc bulge with disc desiccation. Disc bulging asymmetric to the right with associated right far lateral reactive endplate changes with marginal endplate osteophytic spurring. Mild to moderate facet and ligament flavum hypertrophy. Epidural lipomatosis. Resultant moderate spinal stenosis. Moderate right with mild left L3 foraminal narrowing. L4-5: Mild diffuse disc bulge with disc desiccation. Disc bulging asymmetric to the right with superimposed central and right foraminal annular fissures. Moderate facet and ligament flavum hypertrophy. Epidural lipomatosis. Resultant moderate spinal stenosis. Moderate bilateral L4 foraminal narrowing, right worse than left. L5-S1: Mild diffuse disc bulge with disc desiccation. Epidural lipomatosis. Mild to moderate facet hypertrophy. Compression of the distal thecal sac by the epidural fat. Mild right with moderate left L5 foraminal narrowing. IMPRESSION: MR THORACIC SPINE IMPRESSION 1. Acute T6 compression fracture with up to 50% height loss and 4 mm bony retropulsion with resultant mild to moderate spinal stenosis. 2. No other acute abnormality within the thoracic spine. No other significant stenosis. 3. Mild facet hypertrophy within the lower thoracic spine. MR LUMBAR SPINE IMPRESSION 1. No acute compression fracture or other abnormality within the lumbar spine. 2. Multiple chronic compression fractures with sequelae of prior vertebral augmentation involving the L1, L2, L4, and L5 vertebral bodies as above. 3. Multilevel degenerative spondylolysis with resultant moderate spinal stenosis at L1-2, L3-4 and L4-5. 4. Moderate right L3, bilateral L4, and left L5 foraminal narrowing related to disc bulging and facet hypertrophy. Electronically Signed: By: Rise Mu M.D. On: 11/12/2018 22:47   Mr Lumbar Spine Wo Contrast  Addendum Date: 11/20/2018   ADDENDUM REPORT: 11/20/2018 09:46 ADDENDUM: The site of acute  thoracic compression fracture was inadvertently mislabeled on the initial interpretation. The T12 vertebral body was skipped. The fractured level is actually T7. Electronically Signed   By: Malachy Moan M.D.   On: 11/20/2018 09:46   Result Date: 11/20/2018 CLINICAL DATA:  Initial evaluation for mid and lower back pain. Evaluate for possible compression fractures. EXAM: MRI THORACIC AND LUMBAR SPINE WITHOUT CONTRAST TECHNIQUE: Multiplanar and multiecho pulse sequences of the thoracic and lumbar spine were obtained without intravenous contrast. COMPARISON:  None available. FINDINGS: MRI THORACIC SPINE FINDINGS Alignment: Mild roto dextroscoliosis of the thoracic spine. Alignment otherwise normal with preservation of the normal thoracic kyphosis. No listhesis. Vertebrae: There is an acute compression fracture involving the T6 vertebral body. Associated height loss of up to 50% with 4 mm bony retropulsion. This is benign/mechanical in appearance. The fracture appears largely confined to the T6 vertebral body itself, although reactive marrow edema extends into the posterior elements bilaterally. Vertebral body heights otherwise maintained without evidence for acute or chronic fracture. Chronic compression deformities with sequelae of prior vertebral augmentation noted at L1 and L2. Underlying bone marrow  signal intensity within normal limits. No discrete or worrisome osseous lesions. No other abnormal marrow edema. Cord: Signal intensity within the thoracic spinal cord is normal. Normal cord caliber and morphology. Conus medullaris terminates at approximately the L2 level. Paraspinal and other soft tissues: Mild paraspinous edema adjacent to the T6 compression fracture. Paraspinous soft tissues demonstrate no other acute finding. Atherosclerotic change noted within the aorta. Scattered T2 hyperintense renal cysts noted bilaterally, largest of which seen on the right and measures 19 mm in diameter. Remainder of  the visualized visceral structures otherwise unremarkable. Visualized lungs are grossly clear. Disc levels: T1-2:  Unremarkable. T2-3: Unremarkable. T3-4:  Unremarkable. T4-5:  Unremarkable. T5-6: 4 mm bony retropulsion related to the acute T6 compression fracture. Ventral CSF largely effaced by retropulsed bone. Superimposed mild facet hypertrophy. Dorsal epidural lipomatosis. Resultant mild to moderate spinal stenosis. Foramina remain patent. T6-7:  Epidural lipomatosis with resultant mild spinal stenosis. T7-8: Mild dorsal epidural lipomatosis. No significant spinal stenosis. T8-9:  Unremarkable. T9-10: Mild bilateral facet hypertrophy.  No stenosis. T10-11:  Mild facet hypertrophy.  No stenosis. T11-12:  Mild facet hypertrophy.  No stenosis. MRI LUMBAR SPINE FINDINGS Segmentation: There appear to be 5 lumbar type vertebral bodies. However, when counting from the dens superiorly there only appear to be 11 type thoracic vertebral bodies. Lowest well-formed disc space will be labeled L5-S1. Alignment: Mild levoscoliosis. Alignment otherwise normal with preservation of the normal lumbar lordosis. No listhesis. Vertebrae: Chronic compression deformity involving the L1 vertebral body with sequelae of prior vertebral augmentation. Associated height loss of up to 50-60 % with 5 mm bony retropulsion. Additional chronic compression deformity involving the L2 vertebral body with sequelae of vertebral augmentation. Associated mild approximate 25% height loss without bony retropulsion. Chronic compression deformity at L4 with up to 40-50% height loss without bony retropulsion. Additional chronic compression deformity at L5 with sequelae of prior vertebral augmentation. Height loss measures up to approximately 40% without bony retropulsion. No acute compression fracture within the lumbar spine. Underlying bone marrow signal intensity heterogeneous without discrete or worrisome osseous lesions. No abnormal marrow edema. Conus  medullaris and cauda equina: Conus extends to the L2 level. Conus and cauda equina appear normal. Paraspinal and other soft tissues: Paraspinous soft tissues demonstrate no acute finding. Chronic fatty atrophy noted within the posterior paraspinous and psoas musculature bilaterally. Atherosclerotic change noted within the aorta. Bilateral renal cyst noted. Disc levels: L1-2: 5 mm bony retropulsion related to the chronic L1 compression fracture. Retropulsed bone indents the ventral thecal sac. Mild facet hypertrophy. Resultant mild to moderate spinal stenosis. Mild bilateral L1 foraminal narrowing. L2-3: Mild disc bulge with disc desiccation. Mild bilateral facet and ligament flavum hypertrophy. No significant spinal stenosis. Mild bilateral L2 foraminal narrowing, left greater than right. L3-4: Diffuse disc bulge with disc desiccation. Disc bulging asymmetric to the right with associated right far lateral reactive endplate changes with marginal endplate osteophytic spurring. Mild to moderate facet and ligament flavum hypertrophy. Epidural lipomatosis. Resultant moderate spinal stenosis. Moderate right with mild left L3 foraminal narrowing. L4-5: Mild diffuse disc bulge with disc desiccation. Disc bulging asymmetric to the right with superimposed central and right foraminal annular fissures. Moderate facet and ligament flavum hypertrophy. Epidural lipomatosis. Resultant moderate spinal stenosis. Moderate bilateral L4 foraminal narrowing, right worse than left. L5-S1: Mild diffuse disc bulge with disc desiccation. Epidural lipomatosis. Mild to moderate facet hypertrophy. Compression of the distal thecal sac by the epidural fat. Mild right with moderate left L5 foraminal narrowing. IMPRESSION: MR THORACIC  SPINE IMPRESSION 1. Acute T6 compression fracture with up to 50% height loss and 4 mm bony retropulsion with resultant mild to moderate spinal stenosis. 2. No other acute abnormality within the thoracic spine. No  other significant stenosis. 3. Mild facet hypertrophy within the lower thoracic spine. MR LUMBAR SPINE IMPRESSION 1. No acute compression fracture or other abnormality within the lumbar spine. 2. Multiple chronic compression fractures with sequelae of prior vertebral augmentation involving the L1, L2, L4, and L5 vertebral bodies as above. 3. Multilevel degenerative spondylolysis with resultant moderate spinal stenosis at L1-2, L3-4 and L4-5. 4. Moderate right L3, bilateral L4, and left L5 foraminal narrowing related to disc bulging and facet hypertrophy. Electronically Signed: By: Rise Mu M.D. On: 11/12/2018 22:47   Dg Chest Port 1 View  Result Date: 11/12/2018 CLINICAL DATA:  Dyspnea.  Fever and pain. EXAM: PORTABLE CHEST 1 VIEW COMPARISON:  11/12/2018 at 1245 hours FINDINGS: Portable semi upright view of the chest was obtained. Linear densities in the left lower chest are similar to the recent comparison examination could represent scarring or atelectasis. Slightly prominent lung markings appear chronic. Old fracture of the right sixth rib. Heart size is within normal limits stable. Atherosclerotic calcifications at the aortic arch. Negative for a pneumothorax. IMPRESSION: Stable chest radiograph findings from the recent comparison examination. Linear densities in left lower chest are suggestive for subsegmental atelectasis versus scarring. Electronically Signed   By: Richarda Overlie M.D.   On: 11/12/2018 13:43   Ir Kypho Thoracic With Bone Biopsy  Result Date: 11/20/2018 INDICATION: 77 year old female with symptomatic T7 compression fracture. EXAM: Prior MRI thoracic and lumbar spine COMPARISON:  Prior MRI thoracic and lumbar spine 11/12/2018 MEDICATIONS: As antibiotic prophylaxis, 2 g Ancef was ordered pre-procedure and administered intravenously within 1 hour of incision. ANESTHESIA/SEDATION: Moderate (conscious) sedation was employed during this procedure. A total of Versed 3 mg and Fentanyl  150 mcg was administered intravenously. Moderate Sedation Time: 41 minutes. The patient's level of consciousness and vital signs were monitored continuously by radiology nursing throughout the procedure under my direct supervision. FLUOROSCOPY TIME:  Fluoroscopy Time: 11 minutes 36 seconds (475 mGy) COMPLICATIONS: None immediate. PROCEDURE: Following a full explanation of the procedure along with the potential associated complications, an informed witnessed consent was obtained. The patient was placed prone on the fluoroscopic table. The skin overlying the thoracic region was then prepped and draped in the usual sterile fashion. The right pedicle at T7 was then infiltrated with 0.25% bupivacaine followed by the advancement of an 11-gauge Jamshidi needle through the T7 pedicle into the posterior one-third at T7. This was then exchanged for a Kyphon advanced osteo introducer system comprised of a working cannula and a Kyphon osteo drill. This combination was then advanced over a Kyphon osteo bone pin until the tip of the Kyphon osteo drill was in the posterior third at T7. At this time, the bone pin was removed. In a medial trajectory, the combination was advanced until the tip of the working cannula was inside the posterior one-third at T7. Using the same technique, a second 11 gauge trocar needle was advanced through the left T7 pedicle in positioned in the posterior 1/3 of the T7 vertebral body. The osteo drill was again advanced into the anterior 1/3 at T7 and then removed. Through the working cannula, a Kyphon inflatable bone tamps 15 x 2 was advanced and positioned with the distal marker 5 mm from the anterior aspect of T7. The balloons were well-positioned on the  AP projection. At this time, the balloons were expanded using contrast via a Kyphon inflation syringe device via microtubing. Inflations were continued until there was apposition with the superior and the inferior endplates. At this time,  methylmethacrylate mixture was reconstituted with Tobramycin in the Kyphon bone mixing device system. The balloons were deflated and removed followed by installation of methylmethacrylate mixture at T7 with excellent filling in the AP and lateral projections. No extravasation was noted in the disk spaces or posteriorly into the spinal canal. No epidural venous contamination was seen. The working cannula and the bone filler were then retrieved and removed. IMPRESSION: 1. Status post vertebral body augmentation using balloon kyphoplasty at T7 as described without event. Signed, Sterling Big, MD, RPVI [facility] Electronically Signed   By: Malachy Moan M.D.   On: 11/20/2018 12:44   Ct Renal Stone Study  Result Date: 11/03/2018 CLINICAL DATA:  Worsening right flank pain.  Urinary frequency. EXAM: CT ABDOMEN AND PELVIS WITHOUT CONTRAST TECHNIQUE: Multidetector CT imaging of the abdomen and pelvis was performed following the standard protocol without IV contrast. COMPARISON:  None. FINDINGS: Lower chest: No acute findings. Hepatobiliary: No mass visualized on this unenhanced exam. Gallbladder is unremarkable. Pancreas: No mass or inflammatory process visualized on this unenhanced exam. Spleen:  Within normal limits in size. Adrenals/Urinary tract: A small fluid attenuation right renal cyst is noted. No evidence of urolithiasis or hydronephrosis. Unremarkable unopacified urinary bladder. Stomach/Bowel: No evidence of obstruction, inflammatory process, or abnormal fluid collections. Normal appendix visualized. Vascular/Lymphatic: No pathologically enlarged lymph nodes identified. No evidence of abdominal aortic aneurysm. Aortic atherosclerosis. Reproductive:  No mass or other significant abnormality. Other:  None. Musculoskeletal: No suspicious bone lesions identified. Generalized osteopenia noted, with several old lumbar vertebral compression  fractures and vertebroplasties. IMPRESSION: No acute findings. No evidence of ureteral calculi or hydronephrosis. Electronically Signed   By: Myles Rosenthal M.D.   On: 11/03/2018 01:03    Microbiology: Recent Results (from the past 240 hour(s))  Urine Culture     Status: None   Collection Time: 11/16/18  1:25 PM  Result Value Ref Range Status   Specimen Description   Final    URINE, CLEAN CATCH Performed at [facility], [address], [place], [place] [address]    Special Requests   Final    NONE Performed at [facility], [address], [place], [place] [address]    Culture   Final    NO GROWTH Performed at [facility], [address] [place], [place] [address]    Report Status 11/18/2018 FINAL  Final     Labs: Basic Metabolic Panel: Recent Labs  Lab 11/17/18 0403 11/18/18 0357 11/19/18 0426 11/20/18 0448 11/21/18 0546 11/23/18 0505  NA 135 137 140 138 138 140  K 3.6 3.5 4.0 4.1 3.9 3.9  CL 101 103 106 104 104 106  CO2 27 24 26 25 24 24   GLUCOSE 185* 167* 89 110* 125* 154*  BUN 11 10 10 15 10 8   CREATININE 0.53 0.38* 0.48 0.61 0.51 0.51  CALCIUM 8.5* 8.3* 8.5* 8.5* 8.3* 8.6*  MG 2.1 2.2  --   --   --   --   PHOS 3.1 2.6  --   --   --   --    Liver Function Tests: Recent Labs  Lab 11/17/18 0403 11/18/18 0357  AST 14* 14*  ALT 14 13  ALKPHOS 56 52  BILITOT 0.9 0.7  PROT 6.5 6.3*  ALBUMIN 2.9* 2.8*   No results for input(s): LIPASE, AMYLASE in the last 168 hours. No results for input(s): AMMONIA in the last 168 hours. CBC: Recent Labs  Lab 11/17/18 0403 11/18/18 0357 11/20/18 0448 11/21/18 0546 11/22/18 0541 11/23/18 0505  WBC 9.0 8.3 6.0 5.6  --  4.4  NEUTROABS 6.9 6.0  --  3.8  --   --   HGB 12.7 12.4 11.5* 12.0 11.8* 12.1  HCT 39.8 38.1 36.1 37.7 37.4 38.7  MCV 81.9 81.6 81.5 81.4  --  81.5  PLT 273 268 300 284  --  290   Cardiac Enzymes: No results for input(s): CKTOTAL, CKMB, CKMBINDEX,  TROPONINI in the last 168 hours. BNP: BNP (last 3 results) No results for input(s): BNP in the last 8760 hours.  ProBNP (last 3 results) No results for input(s): PROBNP in the last 8760 hours.  CBG: Recent Labs  Lab 11/22/18 1209 11/22/18 1730 11/22/18 2109 11/23/18 0737 11/23/18 1204  GLUCAP 157* 139* 131* 150* 153*       Signed:  Ramiro Harvest MD.  [facility] 11/23/2018, 2:51 PM

## 2018-11-23 NOTE — Care Management (Signed)
Per PT eval pt is very close to baseline with ambulation (normally walks with walker). Due to insurance unable to secure HHPT for pt. 3in1 order received and [facility] rep alerted of DME need. All above information relayed to pt and family at bedside via interpreter. Sandford Crazeora Orey Moure RN,BSN [phone]

## 2018-11-23 NOTE — Progress Notes (Signed)
Pt's daughter received discharge instructions and had no immediate questions or concerns. Pt will be transported downstairs in wheelchair with family when she is ready to go.

## 2018-11-23 NOTE — Care Management Important Message (Signed)
Important Message  Patient Details  Name: Amy Hood MRN: [id number] Date of Birth: 1941/06/01   Medicare Important Message Given:  Yes    Caren MacadamFuller, Jasemine Nawaz 11/23/2018, 1:09 PMImportant Message  Patient Details  Name: Amy Hood MRN: [id number] Date of Birth: 1941/06/01   Medicare Important Message Given:  Yes    Caren MacadamFuller, Waverly Tarquinio 11/23/2018, 1:09 PM

## 2018-11-23 NOTE — Progress Notes (Signed)
Pt was sleeping when I went to attempt my 10 am med pass. Family requested that I come back when she was awake. Jilda Rocheaeja, RN

## 2018-11-23 NOTE — Care Management Important Message (Signed)
Important Message  Patient Details  Name: Amy Hood MRN: [id number] Date of Birth: 01-12-1941   Medicare Important Message Given:  Yes    Caren MacadamFuller, Renesha Lizama 11/23/2018, 1:14 PMImportant Message  Patient Details  Name: Amy Hood MRN: [id number] Date of Birth: 01-12-1941   Medicare Important Message Given:  Yes    Caren MacadamFuller, Danitza Schoenfeldt 11/23/2018, 1:14 PM

## 2018-11-23 NOTE — Progress Notes (Signed)
Occupational Therapy Treatment Patient Details Name: Amy Hood MRN: [id number] DOB: [birth year] Today's Date: 11/23/2018    History of present illness Patient is a 77 year old female admitted 11/12/2018 with diagnosis of compression fracture of T6. PMH: paroxysmal a fib, T2DM, hyperglydemia, lactic acid acidosis, CAD s/p percutaneous coronary angioplasty, chronic arthritis, dyslipidemia. Patient usually ambulates with a walker. Language barriers. IR kyphoplasty with bone biopsy at T7 11/20/2018.   OT comments  Pt with good participation with OT. Daughter states plan in home and she will provide 24/7 A  Follow Up Recommendations  Supervision/Assistance - 24 hour;Home health OT    Equipment Recommendations  Other (comment);3 in 1 bedside commode(Defer to next venue)       Precautions / Restrictions Precautions Precautions: Fall       Mobility Bed Mobility               General bed mobility comments: pt inchair  Transfers Overall transfer level: Needs assistance Equipment used: Rolling walker (2 wheeled) Transfers: Sit to/from UGI CorporationStand;Stand Pivot Transfers Sit to Stand: Supervision Stand pivot transfers: Supervision                ADL either performed or assessed with clinical judgement   ADL Overall ADL's : Needs assistance/impaired     Grooming: Wash/dry hands;Standing;Supervision/safety               Lower Body Dressing: Minimal assistance;Sit to/from stand;Cueing for sequencing;Cueing for safety- pt able to cross legs up to readjust socks. No bending   Toilet Transfer: Supervision/safety;Comfort height toilet;RW   Toileting- Clothing Manipulation and Hygiene: Supervision/safety;Sit to/from stand         General ADL Comments: Daugther present and translated.  Pt wanted to walker in hall with OT after ADL activity . Pt overall S with walker. Pt daugther provides 23/7 A at home. Pt with GREAT participation               Cognition  Arousal/Alertness: Awake/alert Behavior During Therapy: WFL for tasks assessed/performed Overall Cognitive Status: Within Functional Limits for tasks assessed                                                     Pertinent Vitals/ Pain       Pain Assessment: No/denies pain     Prior Functioning/Environment              Frequency  Min 2X/week        Progress Toward Goals  OT Goals(current goals can now be found in the care plan section)     Acute Rehab OT Goals Patient Stated Goal: Go home with HHPT to get stronger; less pain  Plan Discharge plan remains appropriate       AM-PAC OT "6 Clicks" Daily Activity     Outcome Measure   Help from another person eating meals?: None Help from another person taking care of personal grooming?: A Little Help from another person toileting, which includes using toliet, bedpan, or urinal?: A Little Help from another person bathing (including washing, rinsing, drying)?: A Lot Help from another person to put on and taking off regular upper body clothing?: A Little Help from another person to put on and taking off regular lower body clothing?: A Lot 6 Click Score: 17    End of Session Equipment Utilized  During Treatment: Rolling walker  OT Visit Diagnosis: Muscle weakness (generalized) (M62.81);Pain Pain - Right/Left: (Low back pain with functional activity) Pain - part of body: (Back)   Activity Tolerance Patient tolerated treatment well   Patient Left with call bell/phone within reach;with family/visitor present;in chair   Nurse Communication Mobility status;Other (comment)(Toileting and ADL/grooming performed)        Time: 1191-4782 OT Time Calculation (min): 19 min  Charges: OT General Charges $OT Visit: 1 Visit OT Treatments $Self Care/Home Management : 8-22 mins  Lise Auer, OT Acute Rehabilitation Services [phone] Office- [phone]      Abhiram Criado, Amy Hood  D 11/23/2018, 11:34 AM

## 2018-11-23 NOTE — Progress Notes (Signed)
PT Cancellation Note  Patient Details Name: Amy Hood MRN: [id number] DOB: 10-23-1941   Cancelled Treatment:    Reason Eval/Treat Not Completed: Other (comment)(per daughter, pt ambulated 45800' with RW earlier this morning, no loss of balance, and she is DCing home today. Encouraged pt to walk frequently at home to progress mobility.  )   Tamala SerUhlenberg, Tondra Reierson Kistler PT 11/23/2018  Acute Rehabilitation Services Pager [phone] Office [phone]

## 2018-12-15 ENCOUNTER — Encounter: Payer: Self-pay | Admitting: Physician Assistant

## 2019-02-15 ENCOUNTER — Ambulatory Visit: Payer: Medicare Other | Admitting: Physician Assistant

## 2019-07-24 IMAGING — DX DG CHEST 1V PORT
1 series · 1 of 1 positions shown · non-contrast
Comparison: 12/29/2016

CLINICAL DATA: Weakness

EXAM:
PORTABLE CHEST 1 VIEW

[chest ap]
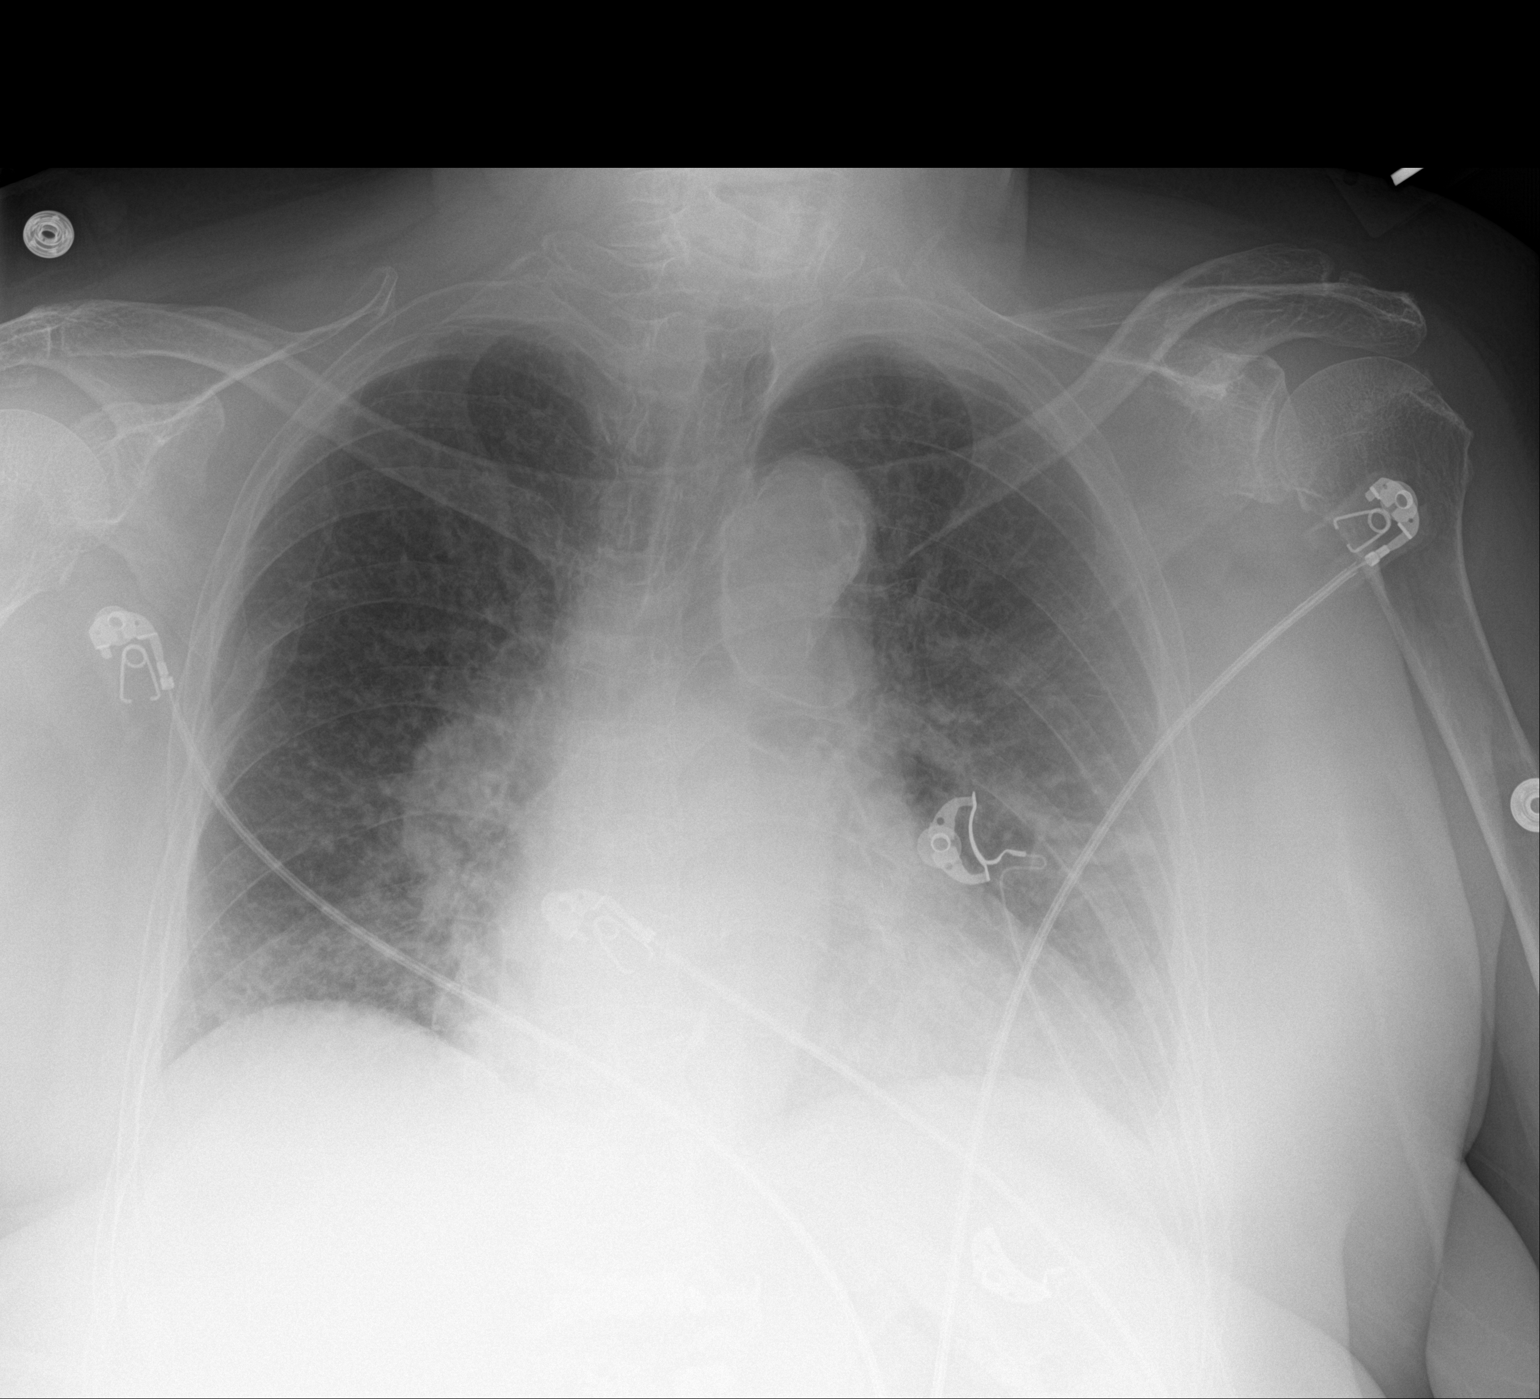

[1 of 1 positions shown; findings below may reference images not displayed]

FINDINGS: Cardiomegaly with vascular congestion and mild edema. No focal
airspace disease or significant effusion. Aortic atherosclerosis. No
pneumothorax. Old right-sided rib fractures.
IMPRESSION: Cardiomegaly with vascular congestion and mild interstitial edema.

## 2019-08-03 ENCOUNTER — Other Ambulatory Visit: Payer: Self-pay | Admitting: Physician Assistant

## 2019-08-29 ENCOUNTER — Other Ambulatory Visit: Payer: Self-pay | Admitting: Physician Assistant

## 2019-08-31 NOTE — Telephone Encounter (Signed)
This is Dr. Montgomery's pt.  °

## 2019-09-25 ENCOUNTER — Other Ambulatory Visit: Payer: Self-pay | Admitting: Physician Assistant

## 2019-10-18 ENCOUNTER — Other Ambulatory Visit: Payer: Self-pay | Admitting: Physician Assistant

## 2019-11-14 IMAGING — DX DG CHEST 1V PORT
1 series · 1 of 1 positions shown · non-contrast
Comparison: 11/12/2018 at 3449 hours

CLINICAL DATA: Dyspnea.  Fever and pain.

EXAM:
PORTABLE CHEST 1 VIEW

[chest ap]
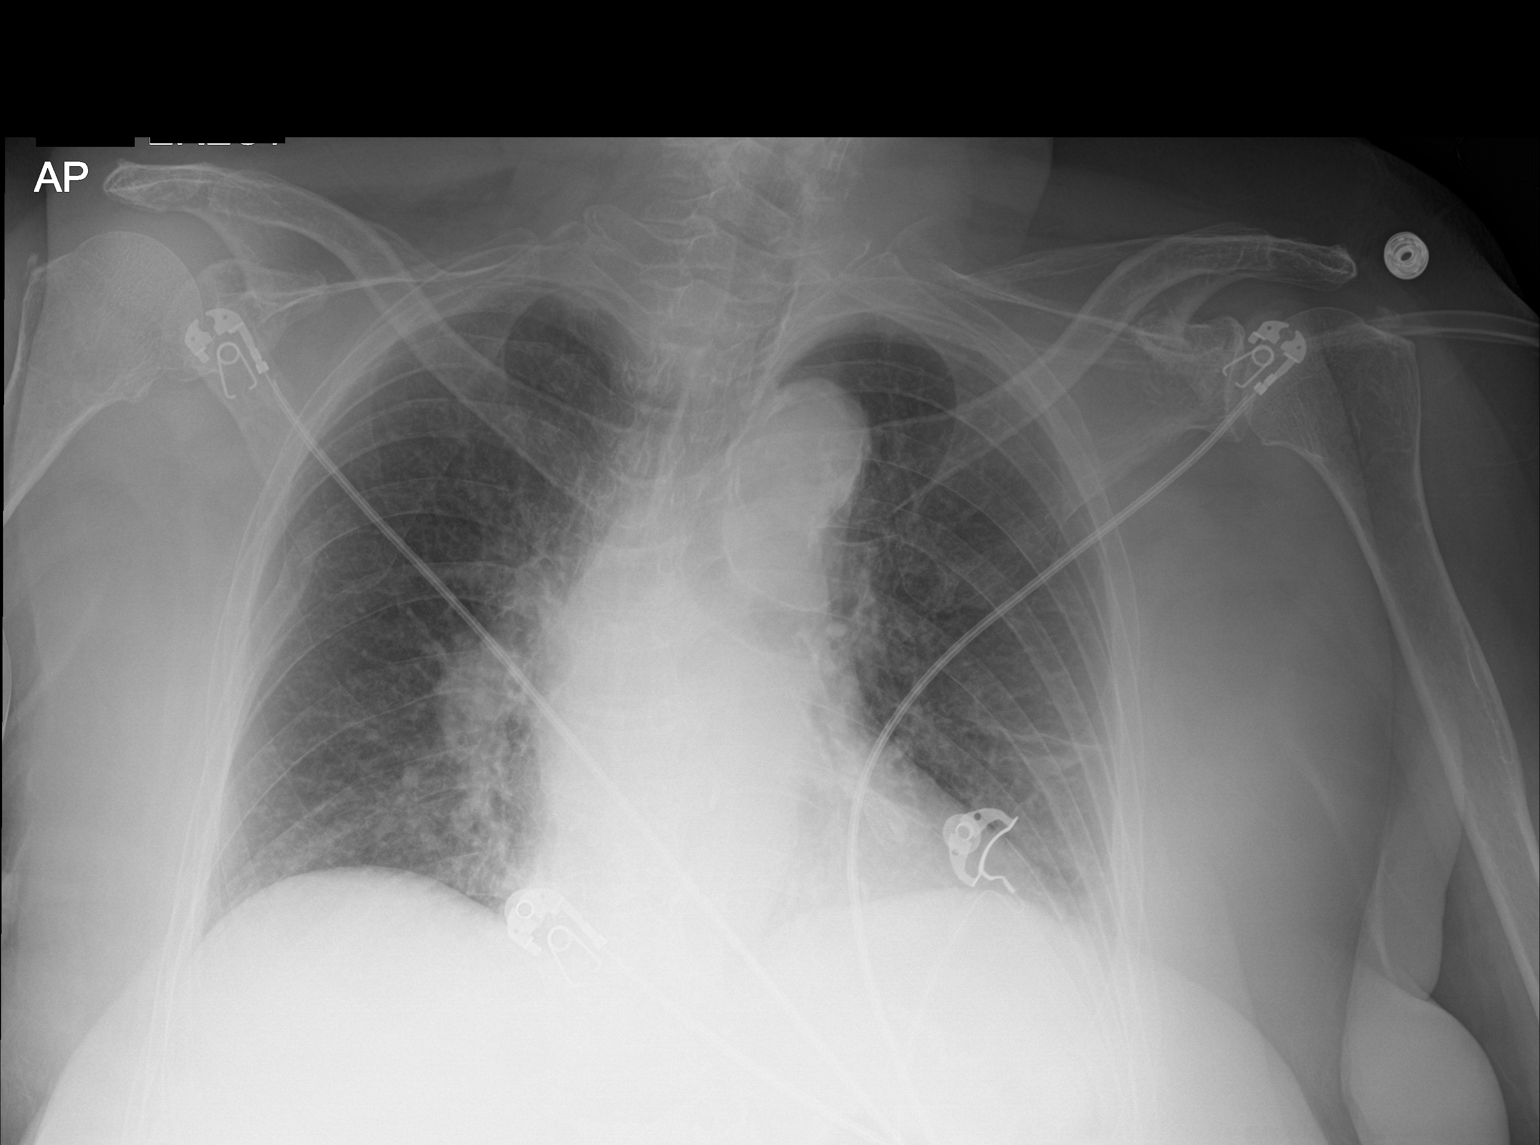

[1 of 1 positions shown; findings below may reference images not displayed]

FINDINGS: Portable semi upright view of the chest was obtained. Linear
densities in the left lower chest are similar to the recent
comparison examination could represent scarring or atelectasis.
Slightly prominent lung markings appear chronic. Old fracture of the
right sixth rib. Heart size is within normal limits stable.
Atherosclerotic calcifications at the aortic arch. Negative for a
pneumothorax.
IMPRESSION: Stable chest radiograph findings from the recent comparison
examination. Linear densities in left lower chest are suggestive for
subsegmental atelectasis versus scarring.

## 2020-10-11 ENCOUNTER — Inpatient Hospital Stay (HOSPITAL_COMMUNITY)
Admission: EM | Admit: 2020-10-11 | Discharge: 2020-10-14 | DRG: 287 | Disposition: A | Discharge status: Home / Self Care | Payer: Medicare Other | Attending: Internal Medicine | Admitting: Internal Medicine

## 2020-10-11 ENCOUNTER — Emergency Department (HOSPITAL_COMMUNITY): Payer: Medicare Other

## 2020-10-11 ENCOUNTER — Encounter (HOSPITAL_COMMUNITY): Payer: Self-pay

## 2020-10-11 DIAGNOSIS — E119 Type 2 diabetes mellitus without complications: Secondary | ICD-10-CM

## 2020-10-11 DIAGNOSIS — E876 Hypokalemia: Secondary | ICD-10-CM | POA: Diagnosis present

## 2020-10-11 DIAGNOSIS — Z955 Presence of coronary angioplasty implant and graft: Secondary | ICD-10-CM

## 2020-10-11 DIAGNOSIS — R531 Weakness: Secondary | ICD-10-CM

## 2020-10-11 DIAGNOSIS — Z79899 Other long term (current) drug therapy: Secondary | ICD-10-CM

## 2020-10-11 DIAGNOSIS — I480 Paroxysmal atrial fibrillation: Secondary | ICD-10-CM | POA: Diagnosis present

## 2020-10-11 DIAGNOSIS — Z7982 Long term (current) use of aspirin: Secondary | ICD-10-CM

## 2020-10-11 DIAGNOSIS — R079 Chest pain, unspecified: Secondary | ICD-10-CM | POA: Diagnosis present

## 2020-10-11 DIAGNOSIS — I2510 Atherosclerotic heart disease of native coronary artery without angina pectoris: Secondary | ICD-10-CM | POA: Diagnosis present

## 2020-10-11 DIAGNOSIS — R7989 Other specified abnormal findings of blood chemistry: Secondary | ICD-10-CM | POA: Diagnosis present

## 2020-10-11 DIAGNOSIS — I252 Old myocardial infarction: Secondary | ICD-10-CM

## 2020-10-11 DIAGNOSIS — R630 Anorexia: Secondary | ICD-10-CM | POA: Diagnosis present

## 2020-10-11 DIAGNOSIS — R911 Solitary pulmonary nodule: Secondary | ICD-10-CM | POA: Diagnosis present

## 2020-10-11 DIAGNOSIS — Z833 Family history of diabetes mellitus: Secondary | ICD-10-CM

## 2020-10-11 DIAGNOSIS — E785 Hyperlipidemia, unspecified: Secondary | ICD-10-CM | POA: Diagnosis present

## 2020-10-11 DIAGNOSIS — E46 Unspecified protein-calorie malnutrition: Secondary | ICD-10-CM | POA: Diagnosis present

## 2020-10-11 DIAGNOSIS — Z789 Other specified health status: Secondary | ICD-10-CM

## 2020-10-11 DIAGNOSIS — I10 Essential (primary) hypertension: Secondary | ICD-10-CM | POA: Diagnosis present

## 2020-10-11 DIAGNOSIS — I25119 Atherosclerotic heart disease of native coronary artery with unspecified angina pectoris: Principal | ICD-10-CM | POA: Diagnosis present

## 2020-10-11 DIAGNOSIS — Z7984 Long term (current) use of oral hypoglycemic drugs: Secondary | ICD-10-CM

## 2020-10-11 DIAGNOSIS — K5900 Constipation, unspecified: Secondary | ICD-10-CM | POA: Diagnosis present

## 2020-10-11 DIAGNOSIS — R778 Other specified abnormalities of plasma proteins: Secondary | ICD-10-CM

## 2020-10-11 DIAGNOSIS — Z20822 Contact with and (suspected) exposure to covid-19: Secondary | ICD-10-CM | POA: Diagnosis present

## 2020-10-11 DIAGNOSIS — I248 Other forms of acute ischemic heart disease: Secondary | ICD-10-CM | POA: Diagnosis present

## 2020-10-11 DIAGNOSIS — D509 Iron deficiency anemia, unspecified: Secondary | ICD-10-CM | POA: Diagnosis present

## 2020-10-11 DIAGNOSIS — Z23 Encounter for immunization: Secondary | ICD-10-CM

## 2020-10-11 LAB — CBC WITH DIFFERENTIAL/PLATELET
Abs Immature Granulocytes: 0.08 10*3/uL — ABNORMAL HIGH (ref 0.00–0.07)
Basophils Absolute: 0 10*3/uL (ref 0.0–0.1)
Basophils Relative: 0 %
Eosinophils Absolute: 0 10*3/uL (ref 0.0–0.5)
Eosinophils Relative: 0 %
HCT: 35.2 % — ABNORMAL LOW (ref 36.0–46.0)
Hemoglobin: 11.6 g/dL — ABNORMAL LOW (ref 12.0–15.0)
Immature Granulocytes: 1 %
Lymphocytes Relative: 11 %
Lymphs Abs: 1.3 10*3/uL (ref 0.7–4.0)
MCH: 26.6 pg (ref 26.0–34.0)
MCHC: 33 g/dL (ref 30.0–36.0)
MCV: 80.7 fL (ref 80.0–100.0)
Monocytes Absolute: 0.8 10*3/uL (ref 0.1–1.0)
Monocytes Relative: 7 %
Neutro Abs: 10.5 10*3/uL — ABNORMAL HIGH (ref 1.7–7.7)
Neutrophils Relative %: 81 %
Platelets: 264 10*3/uL (ref 150–400)
RBC: 4.36 MIL/uL (ref 3.87–5.11)
RDW: 13.1 % (ref 11.5–15.5)
WBC: 12.7 10*3/uL — ABNORMAL HIGH (ref 4.0–10.5)
nRBC: 0 % (ref 0.0–0.2)

## 2020-10-11 LAB — BASIC METABOLIC PANEL
Anion gap: 11 (ref 5–15)
BUN: 7 mg/dL — ABNORMAL LOW (ref 8–23)
CO2: 23 mmol/L (ref 22–32)
Calcium: 7.8 mg/dL — ABNORMAL LOW (ref 8.9–10.3)
Chloride: 101 mmol/L (ref 98–111)
Creatinine, Ser: 0.54 mg/dL (ref 0.44–1.00)
GFR, Estimated: >60 mL/min (ref >60)
Glucose, Bld: 142 mg/dL — ABNORMAL HIGH (ref 70–99)
Potassium: 4.4 mmol/L (ref 3.5–5.1)
Sodium: 135 mmol/L (ref 135–145)

## 2020-10-11 LAB — TROPONIN I (HIGH SENSITIVITY)
Troponin I (High Sensitivity): 117 ng/L (ref <18)
Troponin I (High Sensitivity): 114 ng/L (ref <18)

## 2020-10-11 LAB — RESPIRATORY PANEL BY RT PCR (FLU A&B, COVID)
Influenza A by PCR: NEGATIVE
Influenza B by PCR: NEGATIVE
SARS Coronavirus 2 by RT PCR: NEGATIVE

## 2020-10-11 LAB — BRAIN NATRIURETIC PEPTIDE: B Natriuretic Peptide: 491.7 pg/mL — ABNORMAL HIGH (ref 0.0–100.0)

## 2020-10-11 MED ORDER — SODIUM CHLORIDE 0.9 % IV BOLUS
500.0000 mL | Freq: Once | INTRAVENOUS | Status: AC
  Administered 2020-10-11: 500 mL via INTRAVENOUS

## 2020-10-11 MED ORDER — ASPIRIN EC 325 MG PO TBEC
325.0000 mg | DELAYED_RELEASE_TABLET | Freq: Once | ORAL | Status: AC
  Administered 2020-10-12: 325 mg via ORAL
  Filled 2020-10-11: qty 1

## 2020-10-11 NOTE — ED Provider Notes (Signed)
[facility] DEPT Provider Note   CSN: [id number] Arrival date & time: 10/11/20  1812     History Chief Complaint  Patient presents with  . Chest Pain  . Constipation  . Weakness    Amy Hood is a 79 y.o. female.  Pt presents to the ED today with weakness, sob, and cp.  Pt's daughter gives hx.  Pt has not been feeling well for 3 days.  Pt has not had a fever.  She has not been vaccinated against Covid.  Pt's daughter said pt has been sleeping a lot.  She has not been able to ambulate.  Pt does not speak [place].  Information is obtained via daughter.        Past Medical History:  Diagnosis Date  . Anemia   . Arthritis   . CAD (coronary artery disease) 12/28/2016   S/p NSTEMI 1/18:  LHC - oLAD, mLAD 80, pLCx 75, mRCA 25 >> PCI:  3 x 24 mm Synergy DES to mid LAD; 3.5 x 28 mm Synergy DES to proximal LCx // Echo 1/18:  Mild LVH, EF 55-60, apical inf and apical HK, Gr 1 DD, AV mean 13, MAC, trivial MR, reduced RVSF, trivial TR, PASP 35, no eff  . Cataracts, bilateral   . Essential hypertension   . Generalized weakness   . Hard of hearing   . History of atrial fibrillation    post MI - likely related to acute medical illness therefore anticoag not started  . Hyperlipidemia   . Hyponatremia   . Mild aortic stenosis 12/30/2016   Echo 1/18:  Mild LVH, EF 55-60, apical inf and apical HK, Gr 1 DD, AV mean 13, MAC, trivial MR, reduced RVSF, trivial TR, PASP 35, no eff // Echo 09/02/2018:  Severe focal basal septal hypertrophy, EF 60-65, no RWMA, Gr 2 DD, very mild AS (mean 11), mild LAE   . Type 2 diabetes mellitus [facility])     Patient Active Problem List   Diagnosis Date Noted  . Elevated troponin 10/12/2020  . Chest pain 10/12/2020  . Compression fracture of T7 vertebra (HCC)   . Dehydration   . Compression fracture of T6 vertebra ([place]) 11/18/2018  . Lactic acid acidosis   . CAD S/P percutaneous coronary angioplasty   . Hyperglycemia 11/12/2018    . Type 2 diabetes mellitus without complication ([place]) 26/94/8546  . Pressure injury of skin 12/31/2016  . Mild aortic stenosis 12/30/2016  . NSTEMI (non-ST elevated myocardial infarction) ([place]) 12/28/2016  . CAD (coronary artery disease) 12/28/2016  . Abnormal ECG 12/28/2016  . Generalized weakness 12/28/2016  . Non-English speaking patient 12/28/2016  . Thrombocytopenia ([place]) 12/28/2016  . PAF (paroxysmal atrial fibrillation) ([place]) 12/28/2016  . Hard of hearing 12/28/2016  . Essential hypertension   . Arthritis   . Compression fracture of L5 lumbar vertebra ([place]) 09/20/2013  . Acute lumbar radiculopathy 09/20/2013  . Acute encephalopathy 09/19/2013  . Suicidal behavior 09/19/2013  . Hyperlipidemia   . Cataracts, bilateral     Past Surgical History:  Procedure Laterality Date  . CARDIAC CATHETERIZATION N/A 12/31/2016   Procedure: Left Heart Cath and Coronary Angiography;  Surgeon: Jettie Booze, MD;  Location: [facility] CV LAB;  Service: Cardiovascular;  Laterality: N/A;  . CARDIAC CATHETERIZATION N/A 12/31/2016   Procedure: Coronary Stent Intervention;  Surgeon: Jettie Booze, MD;  Location: [facility] CV LAB;  Service: Cardiovascular;  Laterality: N/A;  . IR KYPHO THORACIC WITH BONE BIOPSY  11/20/2018  .  KYPHOPLASTY       OB History   No obstetric history on file.     Family History  Problem Relation Age of Onset  . Diabetes Mother     Social History   Tobacco Use  . Smoking status: Never Smoker  . Smokeless tobacco: Never Used  Vaping Use  . Vaping Use: Never used  Substance Use Topics  . Alcohol use: No  . Drug use: No    Home Medications Prior to Admission medications   Medication Sig Start Date End Date Taking? Authorizing Provider  acetaminophen (TYLENOL) 500 MG tablet Take 1,000 mg by mouth every 6 (six) hours as needed for mild pain.   Yes [provider]  aspirin 81 MG chewable tablet Chew 81 mg by mouth daily.   Yes [provider]  carvedilol (COREG) 6.25 MG tablet Take 1 tablet (6.25 mg total) by mouth 2 (two) times daily with a meal. 01/02/17  Yes Darrick Meigs, Marge Duncans, MD  cholecalciferol (VITAMIN D3) 25 MCG (1000 UT) tablet Take 1,000 Units by mouth daily.   Yes [provider]  iron polysaccharides (NIFEREX) 150 MG capsule Take 1 capsule (150 mg total) by mouth daily. 09/21/13  Yes Wenda Low, MD  losartan (COZAAR) 25 MG tablet TAKE 1 TABLET BY MOUTH EVERY DAY Patient taking differently: Take 25 mg by mouth daily.  08/31/19  Yes Richardson Dopp T, PA-C  metFORMIN (GLUCOPHAGE) 500 MG tablet Take 500 mg by mouth daily with breakfast. with food 07/31/18  Yes [provider]  oxyCODONE (OXY IR/ROXICODONE) 5 MG immediate release tablet Take 1 tablet (5 mg total) by mouth every 4 (four) hours as needed for severe pain. Use 3 times daily for the next 2 days, and then 1 tablet every 4 hours as needed for pain. 11/23/18  Yes Eugenie Filler, MD  polyethylene glycol [facility] / Floria Raveling) packet Take 17 g by mouth 2 (two) times daily. Patient taking differently: Take 17 g by mouth daily as needed for mild constipation.  11/23/18  Yes Eugenie Filler, MD  rosuvastatin (CRESTOR) 20 MG tablet Take 1 tablet (20 mg total) by mouth daily at 6 PM. 01/02/17  Yes Lama, Marge Duncans, MD  senna-docusate (SENOKOT-S) 8.6-50 MG tablet Take 1 tablet by mouth at bedtime. 11/23/18  Yes Eugenie Filler, MD  lidocaine (LIDODERM) 5 % Place 1 patch onto the skin daily. Remove & Discard patch within 12 hours or as directed by MD Patient not taking: Reported on 10/11/2020 11/23/18   Eugenie Filler, MD    Allergies    Patient has no known allergies.  Review of Systems   Review of Systems  Respiratory: Positive for shortness of breath.   Cardiovascular: Positive for chest pain.  Neurological: Positive for weakness.  All other systems reviewed and are negative.   Physical Exam Updated Vital Signs BP 118/73 (BP  Location: Right Arm)   Pulse 69   Temp 98.2 F (36.8 C) (Oral)   Resp 13   Wt 62.7 kg   SpO2 94%   BMI 29.91 kg/m   Physical Exam Vitals and nursing note reviewed.  Constitutional:      Appearance: She is well-developed.  HENT:     Head: Normocephalic and atraumatic.  Eyes:     Extraocular Movements: Extraocular movements intact.     Pupils: Pupils are equal, round, and reactive to light.  Cardiovascular:     Rate and Rhythm: Normal rate and regular rhythm.  Heart sounds: Normal heart sounds.  Pulmonary:     Effort: Pulmonary effort is normal.  Abdominal:     General: Bowel sounds are normal.     Palpations: Abdomen is soft.  Musculoskeletal:        General: Normal range of motion.     Cervical back: Normal range of motion and neck supple.  Skin:    General: Skin is warm.     Capillary Refill: Capillary refill takes less than 2 seconds.  Neurological:     General: No focal deficit present.     Mental Status: She is alert and oriented to person, place, and time.     ED Results / Procedures / Treatments   Labs (all labs ordered are listed, but only abnormal results are displayed) Labs Reviewed  BASIC METABOLIC PANEL - Abnormal; Notable for the following components:      Result Value   Glucose, Bld 142 (*)    BUN 7 (*)    Calcium 7.8 (*)    All other components within normal limits  BRAIN NATRIURETIC PEPTIDE - Abnormal; Notable for the following components:   B Natriuretic Peptide 491.7 (*)    All other components within normal limits  CBC WITH DIFFERENTIAL/PLATELET - Abnormal; Notable for the following components:   WBC 12.7 (*)    Hemoglobin 11.6 (*)    HCT 35.2 (*)    Neutro Abs 10.5 (*)    Abs Immature Granulocytes 0.08 (*)    All other components within normal limits  D-DIMER, QUANTITATIVE (NOT AT [facility]) - Abnormal; Notable for the following components:   D-Dimer, Quant 1.50 (*)    All other components within normal limits  CBC - Abnormal; Notable for  the following components:   Hemoglobin 11.5 (*)    HCT 34.1 (*)    All other components within normal limits  BASIC METABOLIC PANEL - Abnormal; Notable for the following components:   Glucose, Bld 120 (*)    Creatinine, Ser 0.42 (*)    Calcium 8.3 (*)    All other components within normal limits  CBC - Abnormal; Notable for the following components:   Hemoglobin 11.3 (*)    HCT 34.2 (*)    MCV 79.7 (*)    All other components within normal limits  GLUCOSE, CAPILLARY - Abnormal; Notable for the following components:   Glucose-Capillary 100 (*)    All other components within normal limits  GLUCOSE, CAPILLARY - Abnormal; Notable for the following components:   Glucose-Capillary 134 (*)    All other components within normal limits  TROPONIN I (HIGH SENSITIVITY) - Abnormal; Notable for the following components:   Troponin I (High Sensitivity) 117 (*)    All other components within normal limits  TROPONIN I (HIGH SENSITIVITY) - Abnormal; Notable for the following components:   Troponin I (High Sensitivity) 114 (*)    All other components within normal limits  RESPIRATORY PANEL BY RT PCR (FLU A&B, COVID)  CREATININE, SERUM  GLUCOSE, CAPILLARY  URINALYSIS, ROUTINE W REFLEX MICROSCOPIC  CBC WITH DIFFERENTIAL/PLATELET  COMPREHENSIVE METABOLIC PANEL  PHOSPHORUS  MAGNESIUM    EKG EKG Interpretation  Date/Time:  Wednesday October 11 2020 18:28:31 EDT Ventricular Rate:  76 PR Interval:    QRS Duration: 95 QT Interval:  440 QTC Calculation: 495 R Axis:   -34 Text Interpretation: Sinus rhythm Ventricular premature complex Inferior infarct, age indeterminate Abnormal T, consider ischemia, anterior leads No significant change since last tracing Confirmed by Isla Pence [phone]) on  10/11/2020 6:57:43 PM   Radiology CT ANGIO CHEST PE W OR WO CONTRAST  Result Date: 10/12/2020 CLINICAL DATA:  Chest pain, positive D-dimer level. EXAM: CT ANGIOGRAPHY CHEST WITH CONTRAST TECHNIQUE:  Multidetector CT imaging of the chest was performed using the standard protocol during bolus administration of intravenous contrast. Multiplanar CT image reconstructions and MIPs were obtained to evaluate the vascular anatomy. CONTRAST:  134m OMNIPAQUE IOHEXOL 350 MG/ML SOLN COMPARISON:  None. FINDINGS: Cardiovascular: Satisfactory opacification of the pulmonary arteries to the segmental level. No evidence of pulmonary embolism. Mild cardiomegaly is noted. No pericardial effusion. Extensive coronary artery calcifications are noted. Atherosclerosis of thoracic aorta is noted. Mediastinum/Nodes: No enlarged mediastinal, hilar, or axillary lymph nodes. Thyroid gland, trachea, and esophagus demonstrate no significant findings. Lungs/Pleura: No pneumothorax or pleural effusion is noted. Mild subsegmental atelectasis or infiltrates are noted in the lingula aspect of the left upper lobe. 9 mm nodule is noted in the right perihilar region of the right upper lobe best seen on image number 30 of series 9. Upper Abdomen: Probable small nonobstructive right renal calculus. Musculoskeletal: Status post kyphoplasty at multiple levels of the thoracic and lumbar spine. Several old compression fractures are noted. No definite acute abnormality is noted. Review of the MIP images confirms the above findings. IMPRESSION: 1. No definite evidence of pulmonary embolus. 2. 9 mm nodule is noted in the right perihilar region of the right upper lobe. Consider one of the following in 3 months for both low-risk and high-risk individuals: (a) repeat chest CT, (b) follow-up PET-CT, or (c) tissue sampling. This recommendation follows the consensus statement: Guidelines for Management of Incidental Pulmonary Nodules Detected on CT Images: From the Fleischner Society 2017; Radiology 2017; 284:228-243. These results will be called to the ordering clinician or representative by the Radiologist Assistant, and communication documented in the PACS or  zVision Dashboard. 3. Probable small nonobstructive right renal calculus. 4. Extensive coronary artery calcifications are noted suggesting coronary artery disease. 5. Status post kyphoplasty at multiple levels of the thoracic and lumbar spine. Several old compression fractures are noted. Aortic Atherosclerosis (ICD10-I70.0). Electronically Signed   By: JMarijo ConceptionM.D.   On: 10/12/2020 12:35   DG Chest Port 1 View  Result Date: 10/11/2020 CLINICAL DATA:  Shortness of breath. Weakness and constipation X 3 days. EXAM: PORTABLE CHEST 1 VIEW COMPARISON:  Chest x-ray 11/12/2018 FINDINGS: The heart size and mediastinal contours are within normal limits. Aortic arch calcification. Redemonstration of linear atelectasis versus scarring within the left mid lung zone. Suggestion of a nodule-like opacity within the left mid lung zone likely due to overlapping anterior and posterior ribs. No focal consolidation. No pulmonary edema. No pleural effusion. No pneumothorax. No acute osseous abnormality. Old healed right rib fracture. Interval midthoracic kyphoplasty. IMPRESSION: 1. Suggestion of a nodular-like opacity within the left mid lung zone likely due to overlapping anterior and posterior ribs. Recommend PA and lateral view for further evaluation. 2. Otherwise no acute cardiopulmonary abnormality. Electronically Signed   By: MIven FinnM.D.   On: 10/11/2020 19:27    Procedures Procedures (including critical care time)  Medications Ordered in ED Medications  carvedilol (COREG) tablet 6.25 mg (6.25 mg Oral Given 10/12/20 1635)  losartan (COZAAR) tablet 25 mg (25 mg Oral Given 10/12/20 0902)  rosuvastatin (CRESTOR) tablet 20 mg (20 mg Oral Given 10/12/20 1634)  polyethylene glycol (MIRALAX / GLYCOLAX) packet 17 g (17 g Oral Given 10/12/20 0912)  cholecalciferol (VITAMIN D3) tablet 1,000 Units (1,000 Units Oral  Given 10/12/20 0902)  enoxaparin (LOVENOX) injection 40 mg (40 mg Subcutaneous Given 10/12/20 0902)    acetaminophen (TYLENOL) tablet 650 mg (650 mg Oral Given 10/12/20 0909)    Or  acetaminophen (TYLENOL) suppository 650 mg ( Rectal See Alternative 10/12/20 0909)  ondansetron (ZOFRAN) tablet 4 mg (has no administration in time range)    Or  ondansetron (ZOFRAN) injection 4 mg (has no administration in time range)  insulin aspart (novoLOG) injection 0-15 Units (2 Units Subcutaneous Given 10/12/20 1635)  insulin aspart (novoLOG) injection 0-5 Units (has no administration in time range)  influenza vaccine adjuvanted (FLUAD) injection 0.5 mL (has no administration in time range)  sodium chloride flush (NS) 0.9 % injection 3 mL (has no administration in time range)  sodium chloride flush (NS) 0.9 % injection 3 mL (has no administration in time range)  0.9 %  sodium chloride infusion (has no administration in time range)  aspirin chewable tablet 81 mg (has no administration in time range)  0.9% sodium chloride infusion (has no administration in time range)    Followed by  0.9% sodium chloride infusion (has no administration in time range)  aspirin chewable tablet 81 mg (has no administration in time range)  sodium chloride 0.9 % bolus 500 mL (0 mLs Intravenous Stopped 10/12/20 0015)  aspirin EC tablet 325 mg (325 mg Oral Given 10/12/20 0039)  iohexol (OMNIPAQUE) 350 MG/ML injection 100 mL (100 mLs Intravenous Contrast Given 10/12/20 1201)    ED Course  I have reviewed the triage vital signs and the nursing notes.  Pertinent labs & imaging results that were available during my care of the patient were reviewed by me and considered in my medical decision making (see chart for details).    MDM Rules/Calculators/A&P                          Pt given IVFs.  Troponin is elevated.  Pt denies any current cp, but daughter said she had cp earlier.  Pt's covid swab is negative.  Pt d/w Dr. Humphrey Rolls (triad) for admission.   Amy Hood was evaluated in Emergency Department on 10/12/2020 for the symptoms  described in the history of present illness. She was evaluated in the context of the global COVID-19 pandemic, which necessitated consideration that the patient might be at risk for infection with the SARS-CoV-2 virus that causes COVID-19. Institutional protocols and algorithms that pertain to the evaluation of patients at risk for COVID-19 are in a state of rapid change based on information released by regulatory bodies including the CDC and federal and state organizations. These policies and algorithms were followed during the patient's care in the ED.  Final Clinical Impression(s) / ED Diagnoses Final diagnoses:  Elevated troponin  Language barrier    Rx / DC Orders ED Discharge Orders    None       Isla Pence, MD 10/12/20 1955

## 2020-10-11 NOTE — ED Triage Notes (Signed)
Pt arrived via EMS, c/o chest pain, reproducible with deep breathing, SOB, generalized weakness and constipation x3 days. Denies any other sx or sick contacts.

## 2020-10-12 ENCOUNTER — Other Ambulatory Visit: Payer: Self-pay

## 2020-10-12 ENCOUNTER — Encounter (HOSPITAL_COMMUNITY): Payer: Self-pay | Admitting: Internal Medicine

## 2020-10-12 ENCOUNTER — Observation Stay (HOSPITAL_COMMUNITY): Payer: Medicare Other

## 2020-10-12 DIAGNOSIS — E782 Mixed hyperlipidemia: Secondary | ICD-10-CM | POA: Diagnosis not present

## 2020-10-12 DIAGNOSIS — E876 Hypokalemia: Secondary | ICD-10-CM | POA: Diagnosis present

## 2020-10-12 DIAGNOSIS — I361 Nonrheumatic tricuspid (valve) insufficiency: Secondary | ICD-10-CM | POA: Diagnosis not present

## 2020-10-12 DIAGNOSIS — Z23 Encounter for immunization: Secondary | ICD-10-CM | POA: Diagnosis not present

## 2020-10-12 DIAGNOSIS — R531 Weakness: Secondary | ICD-10-CM | POA: Diagnosis not present

## 2020-10-12 DIAGNOSIS — Z7984 Long term (current) use of oral hypoglycemic drugs: Secondary | ICD-10-CM | POA: Diagnosis not present

## 2020-10-12 DIAGNOSIS — R911 Solitary pulmonary nodule: Secondary | ICD-10-CM | POA: Diagnosis present

## 2020-10-12 DIAGNOSIS — R7989 Other specified abnormal findings of blood chemistry: Secondary | ICD-10-CM | POA: Diagnosis present

## 2020-10-12 DIAGNOSIS — Z20822 Contact with and (suspected) exposure to covid-19: Secondary | ICD-10-CM | POA: Diagnosis present

## 2020-10-12 DIAGNOSIS — I248 Other forms of acute ischemic heart disease: Secondary | ICD-10-CM | POA: Diagnosis present

## 2020-10-12 DIAGNOSIS — I480 Paroxysmal atrial fibrillation: Secondary | ICD-10-CM | POA: Diagnosis present

## 2020-10-12 DIAGNOSIS — I2510 Atherosclerotic heart disease of native coronary artery without angina pectoris: Secondary | ICD-10-CM

## 2020-10-12 DIAGNOSIS — E119 Type 2 diabetes mellitus without complications: Secondary | ICD-10-CM | POA: Diagnosis present

## 2020-10-12 DIAGNOSIS — E785 Hyperlipidemia, unspecified: Secondary | ICD-10-CM | POA: Diagnosis present

## 2020-10-12 DIAGNOSIS — R778 Other specified abnormalities of plasma proteins: Secondary | ICD-10-CM | POA: Diagnosis not present

## 2020-10-12 DIAGNOSIS — E46 Unspecified protein-calorie malnutrition: Secondary | ICD-10-CM | POA: Diagnosis present

## 2020-10-12 DIAGNOSIS — D509 Iron deficiency anemia, unspecified: Secondary | ICD-10-CM | POA: Diagnosis present

## 2020-10-12 DIAGNOSIS — Z955 Presence of coronary angioplasty implant and graft: Secondary | ICD-10-CM | POA: Diagnosis not present

## 2020-10-12 DIAGNOSIS — I25119 Atherosclerotic heart disease of native coronary artery with unspecified angina pectoris: Secondary | ICD-10-CM | POA: Diagnosis present

## 2020-10-12 DIAGNOSIS — Z79899 Other long term (current) drug therapy: Secondary | ICD-10-CM | POA: Diagnosis not present

## 2020-10-12 DIAGNOSIS — I252 Old myocardial infarction: Secondary | ICD-10-CM | POA: Diagnosis not present

## 2020-10-12 DIAGNOSIS — Z833 Family history of diabetes mellitus: Secondary | ICD-10-CM | POA: Diagnosis not present

## 2020-10-12 DIAGNOSIS — E7800 Pure hypercholesterolemia, unspecified: Secondary | ICD-10-CM

## 2020-10-12 DIAGNOSIS — I10 Essential (primary) hypertension: Secondary | ICD-10-CM

## 2020-10-12 DIAGNOSIS — R079 Chest pain, unspecified: Secondary | ICD-10-CM | POA: Diagnosis present

## 2020-10-12 DIAGNOSIS — R630 Anorexia: Secondary | ICD-10-CM | POA: Diagnosis present

## 2020-10-12 DIAGNOSIS — I350 Nonrheumatic aortic (valve) stenosis: Secondary | ICD-10-CM | POA: Diagnosis not present

## 2020-10-12 DIAGNOSIS — K5900 Constipation, unspecified: Secondary | ICD-10-CM | POA: Diagnosis present

## 2020-10-12 DIAGNOSIS — Z7982 Long term (current) use of aspirin: Secondary | ICD-10-CM | POA: Diagnosis not present

## 2020-10-12 LAB — BASIC METABOLIC PANEL
Anion gap: 13 (ref 5–15)
BUN: 10 mg/dL (ref 8–23)
CO2: 23 mmol/L (ref 22–32)
Calcium: 8.3 mg/dL — ABNORMAL LOW (ref 8.9–10.3)
Chloride: 101 mmol/L (ref 98–111)
Creatinine, Ser: 0.42 mg/dL — ABNORMAL LOW (ref 0.44–1.00)
GFR, Estimated: >60 mL/min (ref >60)
Glucose, Bld: 120 mg/dL — ABNORMAL HIGH (ref 70–99)
Potassium: 3.6 mmol/L (ref 3.5–5.1)
Sodium: 137 mmol/L (ref 135–145)

## 2020-10-12 LAB — GLUCOSE, CAPILLARY
Glucose-Capillary: 100 mg/dL — ABNORMAL HIGH (ref 70–99)
Glucose-Capillary: 96 mg/dL (ref 70–99)
Glucose-Capillary: 134 mg/dL — ABNORMAL HIGH (ref 70–99)
Glucose-Capillary: 124 mg/dL — ABNORMAL HIGH (ref 70–99)

## 2020-10-12 LAB — CBC
HCT: 34.1 % — ABNORMAL LOW (ref 36.0–46.0)
HCT: 34.2 % — ABNORMAL LOW (ref 36.0–46.0)
Hemoglobin: 11.3 g/dL — ABNORMAL LOW (ref 12.0–15.0)
Hemoglobin: 11.5 g/dL — ABNORMAL LOW (ref 12.0–15.0)
MCH: 26.3 pg (ref 26.0–34.0)
MCH: 27 pg (ref 26.0–34.0)
MCHC: 33 g/dL (ref 30.0–36.0)
MCHC: 33.7 g/dL (ref 30.0–36.0)
MCV: 79.7 fL — ABNORMAL LOW (ref 80.0–100.0)
MCV: 80 fL (ref 80.0–100.0)
Platelets: 296 10*3/uL (ref 150–400)
Platelets: 303 10*3/uL (ref 150–400)
RBC: 4.26 MIL/uL (ref 3.87–5.11)
RBC: 4.29 MIL/uL (ref 3.87–5.11)
RDW: 13.1 % (ref 11.5–15.5)
RDW: 13.1 % (ref 11.5–15.5)
WBC: 10.1 10*3/uL (ref 4.0–10.5)
WBC: 10.2 10*3/uL (ref 4.0–10.5)
nRBC: 0 % (ref 0.0–0.2)
nRBC: 0 % (ref 0.0–0.2)

## 2020-10-12 LAB — D-DIMER, QUANTITATIVE: D-Dimer, Quant: 1.5 ug/mL-FEU — ABNORMAL HIGH (ref 0.00–0.50)

## 2020-10-12 LAB — CREATININE, SERUM
Creatinine, Ser: 0.52 mg/dL (ref 0.44–1.00)
GFR, Estimated: >60 mL/min (ref >60)

## 2020-10-12 MED ORDER — CARVEDILOL 6.25 MG PO TABS
6.2500 mg | ORAL_TABLET | Freq: Two times a day (BID) | ORAL | Status: DC
  Administered 2020-10-12 – 2020-10-14 (×5): 6.25 mg via ORAL
  Filled 2020-10-12 (×5): qty 1

## 2020-10-12 MED ORDER — ASPIRIN 81 MG PO CHEW
81.0000 mg | CHEWABLE_TABLET | Freq: Every day | ORAL | Status: DC
  Administered 2020-10-12: 81 mg via ORAL
  Filled 2020-10-12: qty 1

## 2020-10-12 MED ORDER — ASPIRIN 81 MG PO CHEW
81.0000 mg | CHEWABLE_TABLET | Freq: Every day | ORAL | Status: DC
  Administered 2020-10-14: 81 mg via ORAL
  Filled 2020-10-12: qty 1

## 2020-10-12 MED ORDER — ACETAMINOPHEN 325 MG PO TABS
650.0000 mg | ORAL_TABLET | Freq: Four times a day (QID) | ORAL | Status: DC | PRN
  Administered 2020-10-12: 650 mg via ORAL
  Filled 2020-10-12: qty 2

## 2020-10-12 MED ORDER — SODIUM CHLORIDE 0.9 % IV SOLN: 250.0000 mL | INTRAVENOUS | Status: DC | PRN

## 2020-10-12 MED ORDER — SODIUM CHLORIDE 0.9 % WEIGHT BASED INFUSION
3.0000 mL/kg/h | INTRAVENOUS | Status: DC
  Administered 2020-10-13: 3 mL/kg/h via INTRAVENOUS

## 2020-10-12 MED ORDER — POLYETHYLENE GLYCOL 3350 17 G PO PACK
17.0000 g | PACK | Freq: Every day | ORAL | Status: DC | PRN
  Administered 2020-10-12: 17 g via ORAL
  Filled 2020-10-12: qty 1

## 2020-10-12 MED ORDER — ACETAMINOPHEN 650 MG RE SUPP: 650.0000 mg | Freq: Four times a day (QID) | RECTAL | Status: DC | PRN

## 2020-10-12 MED ORDER — INSULIN ASPART 100 UNIT/ML ~~LOC~~ SOLN: 0.0000 [IU] | Freq: Every day | SUBCUTANEOUS | Status: DC

## 2020-10-12 MED ORDER — SODIUM CHLORIDE 0.9% FLUSH
3.0000 mL | Freq: Two times a day (BID) | INTRAVENOUS | Status: DC
  Administered 2020-10-12 – 2020-10-13 (×2): 3 mL via INTRAVENOUS

## 2020-10-12 MED ORDER — ENOXAPARIN SODIUM 40 MG/0.4ML ~~LOC~~ SOLN
40.0000 mg | SUBCUTANEOUS | Status: DC
  Administered 2020-10-12: 40 mg via SUBCUTANEOUS
  Filled 2020-10-12: qty 0.4

## 2020-10-12 MED ORDER — IOHEXOL 350 MG/ML SOLN
100.0000 mL | Freq: Once | INTRAVENOUS | Status: AC | PRN
  Administered 2020-10-12: 100 mL via INTRAVENOUS

## 2020-10-12 MED ORDER — ASPIRIN 81 MG PO CHEW
81.0000 mg | CHEWABLE_TABLET | ORAL | Status: AC
  Administered 2020-10-13: 81 mg via ORAL
  Filled 2020-10-12: qty 1

## 2020-10-12 MED ORDER — LOSARTAN POTASSIUM 25 MG PO TABS
25.0000 mg | ORAL_TABLET | Freq: Every day | ORAL | Status: DC
  Administered 2020-10-12 – 2020-10-14 (×3): 25 mg via ORAL
  Filled 2020-10-12 (×3): qty 1

## 2020-10-12 MED ORDER — SODIUM CHLORIDE 0.9% FLUSH: 3.0000 mL | INTRAVENOUS | Status: DC | PRN

## 2020-10-12 MED ORDER — ONDANSETRON HCL 4 MG/2ML IJ SOLN: 4.0000 mg | Freq: Four times a day (QID) | INTRAMUSCULAR | Status: DC | PRN

## 2020-10-12 MED ORDER — SODIUM CHLORIDE 0.9 % WEIGHT BASED INFUSION
1.0000 mL/kg/h | INTRAVENOUS | Status: DC
  Administered 2020-10-13: 1 mL/kg/h via INTRAVENOUS

## 2020-10-12 MED ORDER — INSULIN ASPART 100 UNIT/ML ~~LOC~~ SOLN
0.0000 [IU] | Freq: Three times a day (TID) | SUBCUTANEOUS | Status: DC
  Administered 2020-10-12 – 2020-10-13 (×2): 2 [IU] via SUBCUTANEOUS
  Administered 2020-10-14: 3 [IU] via SUBCUTANEOUS

## 2020-10-12 MED ORDER — ROSUVASTATIN CALCIUM 20 MG PO TABS
20.0000 mg | ORAL_TABLET | Freq: Every day | ORAL | Status: DC
  Administered 2020-10-12 – 2020-10-13 (×2): 20 mg via ORAL
  Filled 2020-10-12 (×2): qty 1

## 2020-10-12 MED ORDER — INFLUENZA VAC A&B SA ADJ QUAD 0.5 ML IM PRSY
0.5000 mL | PREFILLED_SYRINGE | INTRAMUSCULAR | Status: AC
  Administered 2020-10-14: 0.5 mL via INTRAMUSCULAR
  Filled 2020-10-12: qty 0.5

## 2020-10-12 MED ORDER — ONDANSETRON HCL 4 MG PO TABS: 4.0000 mg | ORAL_TABLET | Freq: Four times a day (QID) | ORAL | Status: DC | PRN

## 2020-10-12 MED ORDER — VITAMIN D 25 MCG (1000 UNIT) PO TABS
1000.0000 [IU] | ORAL_TABLET | Freq: Every day | ORAL | Status: DC
  Administered 2020-10-12 – 2020-10-14 (×3): 1000 [IU] via ORAL
  Filled 2020-10-12 (×3): qty 1

## 2020-10-12 NOTE — Plan of Care (Signed)

## 2020-10-12 NOTE — Consult Note (Signed)
CARDIOLOGY CONSULT NOTE       Patient ID: Amy Hood MRN: [id number] DOB/AGE: [birth year] 79 y.o.  Admit date: 10/11/2020 Referring Physician: Raiford Noble Primary Physician: Wenda Low, MD Primary Cardiologist: Oval Linsey Reason for Consultation: Chest Pain Elevated troponin  Principal Problem:   Generalized weakness Active Problems:   Hyperlipidemia   Essential hypertension   CAD (coronary artery disease)   Type 2 diabetes mellitus without complication (HCC)   Elevated troponin   Chest pain   HPI:  79 y.o. admitted with weakness and chest discomfort. No interpreter she is [place] Daughter indicates SSCP for 3 days accompanied by dyspnea and weakness. She still has some lingering pain She is COVID negative and CXR with NAD. Troponin has been mildly elevated at 114 and 117. BNP 491.  She is not anemic with no signs of infection. She is normally able to ambulate and do all ADL's. She has a known history of CAD with NSTEMI in January 2018 resulting in DES to Mid LAD and proximal circumflex This hospitalization was complicated by PAF in setting of UTI but she converted and was not felt to need long term anticoagulation. She has been compliant with her meds   ROS All other systems reviewed and negative except as noted above  Past Medical History:  Diagnosis Date  . Anemia   . Arthritis   . CAD (coronary artery disease) 12/28/2016   S/p NSTEMI 1/18:  LHC - oLAD, mLAD 80, pLCx 75, mRCA 25 >> PCI:  3 x 24 mm Synergy DES to mid LAD; 3.5 x 28 mm Synergy DES to proximal LCx // Echo 1/18:  Mild LVH, EF 55-60, apical inf and apical HK, Gr 1 DD, AV mean 13, MAC, trivial MR, reduced RVSF, trivial TR, PASP 35, no eff  . Cataracts, bilateral   . Essential hypertension   . Generalized weakness   . Hard of hearing   . History of atrial fibrillation    post MI - likely related to acute medical illness therefore anticoag not started  . Hyperlipidemia   . Hyponatremia   .  Mild aortic stenosis 12/30/2016   Echo 1/18:  Mild LVH, EF 55-60, apical inf and apical HK, Gr 1 DD, AV mean 13, MAC, trivial MR, reduced RVSF, trivial TR, PASP 35, no eff // Echo 09/02/2018:  Severe focal basal septal hypertrophy, EF 60-65, no RWMA, Gr 2 DD, very mild AS (mean 11), mild LAE   . Type 2 diabetes mellitus (HCC)     Family History  Problem Relation Age of Onset  . Diabetes Mother     Social History   Socioeconomic History  . Marital status: Married    Spouse name: Not on file  . Number of children: Not on file  . Years of education: Not on file  . Highest education level: Not on file  Occupational History  . Not on file  Tobacco Use  . Smoking status: Never Smoker  . Smokeless tobacco: Never Used  Vaping Use  . Vaping Use: Never used  Substance and Sexual Activity  . Alcohol use: No  . Drug use: No  . Sexual activity: Never  Other Topics Concern  . Not on file  Social History Narrative  . Not on file   Social Determinants of Health   Financial Resource Strain:   . Difficulty of Paying Living Expenses: Not on file  Food Insecurity:   . Worried About Charity fundraiser in the Last Year: Not on file  .  Ran Out of Food in the Last Year: Not on file  Transportation Needs:   . Lack of Transportation (Medical): Not on file  . Lack of Transportation (Non-Medical): Not on file  Physical Activity:   . Days of Exercise per Week: Not on file  . Minutes of Exercise per Session: Not on file  Stress:   . Feeling of Stress : Not on file  Social Connections:   . Frequency of Communication with Friends and Family: Not on file  . Frequency of Social Gatherings with Friends and Family: Not on file  . Attends Religious Services: Not on file  . Active Member of Clubs or Organizations: Not on file  . Attends Archivist Meetings: Not on file  . Marital Status: Not on file  Intimate Partner Violence:   . Fear of Current or Ex-Partner: Not on file  . Emotionally  Abused: Not on file  . Physically Abused: Not on file  . Sexually Abused: Not on file    Past Surgical History:  Procedure Laterality Date  . CARDIAC CATHETERIZATION N/A 12/31/2016   Procedure: Left Heart Cath and Coronary Angiography;  Surgeon: Jettie Booze, MD;  Location: [facility];  Service: Cardiovascular;  Laterality: N/A;  . CARDIAC CATHETERIZATION N/A 12/31/2016   Procedure: Coronary Stent Intervention;  Surgeon: Jettie Booze, MD;  Location: [facility];  Service: Cardiovascular;  Laterality: N/A;  . IR KYPHO THORACIC WITH BONE BIOPSY  11/20/2018  . KYPHOPLASTY        Current Facility-Administered Medications:  .  acetaminophen (TYLENOL) tablet 650 mg, 650 mg, Oral, Q6H PRN, 650 mg at 10/12/20 0909 **OR** acetaminophen (TYLENOL) suppository 650 mg, 650 mg, Rectal, Q6H PRN, Bunnie Pion Z, DO .  aspirin chewable tablet 81 mg, 81 mg, Oral, Daily, Bunnie Pion Z, DO, 81 mg at 10/12/20 0902 .  carvedilol (COREG) tablet 6.25 mg, 6.25 mg, Oral, BID WC, Bunnie Pion Z, DO, 6.25 mg at 10/12/20 1635 .  cholecalciferol (VITAMIN D3) tablet 1,000 Units, 1,000 Units, Oral, Daily, Bunnie Pion Z, DO, 1,000 Units at 10/12/20 0902 .  enoxaparin (LOVENOX) injection 40 mg, 40 mg, Subcutaneous, Q24H, Bunnie Pion Z, DO, 40 mg at 10/12/20 3762 .  [START ON 10/13/2020] influenza vaccine adjuvanted (FLUAD) injection 0.5 mL, 0.5 mL, Intramuscular, Tomorrow-1000, Khan, Mohammad Z, DO .  insulin aspart (novoLOG) injection 0-15 Units, 0-15 Units, Subcutaneous, TID WC, Bunnie Pion Z, DO, 2 Units at 10/12/20 1635 .  insulin aspart (novoLOG) injection 0-5 Units, 0-5 Units, Subcutaneous, QHS, Khan, Mohammad Z, DO .  losartan (COZAAR) tablet 25 mg, 25 mg, Oral, Daily, Bunnie Pion Z, DO, 25 mg at 10/12/20 0902 .  ondansetron (ZOFRAN) tablet 4 mg, 4 mg, Oral, Q6H PRN **OR** ondansetron (ZOFRAN) injection 4 mg, 4 mg, Intravenous, Q6H PRN, Bunnie Pion Z, DO .  polyethylene  glycol (MIRALAX / GLYCOLAX) packet 17 g, 17 g, Oral, Daily PRN, Bunnie Pion Z, DO, 17 g at 10/12/20 0912 .  rosuvastatin (CRESTOR) tablet 20 mg, 20 mg, Oral, q1800, Bunnie Pion Z, DO, 20 mg at 10/12/20 1634 . aspirin  81 mg Oral Daily  . carvedilol  6.25 mg Oral BID WC  . cholecalciferol  1,000 Units Oral Daily  . enoxaparin (LOVENOX) injection  40 mg Subcutaneous Q24H  . [START ON 10/13/2020] influenza vaccine adjuvanted  0.5 mL Intramuscular Tomorrow-1000  . insulin aspart  0-15 Units Subcutaneous TID WC  . insulin aspart  0-5 Units Subcutaneous QHS  .  losartan  25 mg Oral Daily  . rosuvastatin  20 mg Oral q1800     Physical Exam: Blood pressure 118/73, pulse 69, temperature 98.2 F (36.8 C), temperature source Oral, resp. rate 13, SpO2 94 %.    Affect appropriate Elderly Vietamese female  HEENT: normal Neck supple with no adenopathy JVP normal no bruits no thyromegaly Lungs clear with no wheezing and good diaphragmatic motion Heart:  S1/S2 AS murmur, no rub, gallop or click PMI normal Abdomen: benighn, BS positve, no tenderness, no AAA no bruit.  No HSM or HJR Distal pulses intact with no bruits No edema Neuro non-focal Skin warm and dry No muscular weakness   Labs:   Lab Results  Component Value Date   WBC 10.1 10/12/2020   WBC 10.2 10/12/2020   HGB 11.5 (L) 10/12/2020   HGB 11.3 (L) 10/12/2020   HCT 34.1 (L) 10/12/2020   HCT 34.2 (L) 10/12/2020   MCV 80.0 10/12/2020   MCV 79.7 (L) 10/12/2020   PLT 303 10/12/2020   PLT 296 10/12/2020    Recent Labs  Lab 10/12/20 0426  NA 137  K 3.6  CL 101  CO2 23  BUN 10  CREATININE 0.42*  0.52  CALCIUM 8.3*  GLUCOSE 120*   Lab Results  Component Value Date   CKTOTAL 34 (L) 11/12/2018   TROPONINI 1.90 (HH) 12/29/2016    Lab Results  Component Value Date   CHOL 116 12/31/2016   CHOL 94 12/28/2016   Lab Results  Component Value Date   HDL 41 12/31/2016   HDL 47 12/28/2016   Lab Results  Component  Value Date   LDLCALC 52 12/31/2016   LDLCALC 36 12/28/2016   Lab Results  Component Value Date   TRIG 117 12/31/2016   TRIG 54 12/28/2016   Lab Results  Component Value Date   CHOLHDL 2.8 12/31/2016   CHOLHDL 2.0 12/28/2016   No results found for: LDLDIRECT    Radiology: CT ANGIO CHEST PE W OR WO CONTRAST  Result Date: 10/12/2020 CLINICAL DATA:  Chest pain, positive D-dimer level. EXAM: CT ANGIOGRAPHY CHEST WITH CONTRAST TECHNIQUE: Multidetector CT imaging of the chest was performed using the standard protocol during bolus administration of intravenous contrast. Multiplanar CT image reconstructions and MIPs were obtained to evaluate the vascular anatomy. CONTRAST:  166m OMNIPAQUE IOHEXOL 350 MG/ML SOLN COMPARISON:  None. FINDINGS: Cardiovascular: Satisfactory opacification of the pulmonary arteries to the segmental level. No evidence of pulmonary embolism. Mild cardiomegaly is noted. No pericardial effusion. Extensive coronary artery calcifications are noted. Atherosclerosis of thoracic aorta is noted. Mediastinum/Nodes: No enlarged mediastinal, hilar, or axillary lymph nodes. Thyroid gland, trachea, and esophagus demonstrate no significant findings. Lungs/Pleura: No pneumothorax or pleural effusion is noted. Mild subsegmental atelectasis or infiltrates are noted in the lingula aspect of the left upper lobe. 9 mm nodule is noted in the right perihilar region of the right upper lobe best seen on image number 30 of series 9. Upper Abdomen: Probable small nonobstructive right renal calculus. Musculoskeletal: Status post kyphoplasty at multiple levels of the thoracic and lumbar spine. Several old compression fractures are noted. No definite acute abnormality is noted. Review of the MIP images confirms the above findings. IMPRESSION: 1. No definite evidence of pulmonary embolus. 2. 9 mm nodule is noted in the right perihilar region of the right upper lobe. Consider one of the following in 3 months for  both low-risk and high-risk individuals: (a) repeat chest CT, (b) follow-up PET-CT, or (c) tissue  sampling. This recommendation follows the consensus statement: Guidelines for Management of Incidental Pulmonary Nodules Detected on CT Images: From the Fleischner Society 2017; Radiology 2017; 284:228-243. These results will be called to the ordering clinician or representative by the Radiologist Assistant, and communication documented in the PACS or zVision Dashboard. 3. Probable small nonobstructive right renal calculus. 4. Extensive coronary artery calcifications are noted suggesting coronary artery disease. 5. Status post kyphoplasty at multiple levels of the thoracic and lumbar spine. Several old compression fractures are noted. Aortic Atherosclerosis (ICD10-I70.0). Electronically Signed   By: Marijo Conception M.D.   On: 10/12/2020 12:35   DG Chest Port 1 View  Result Date: 10/11/2020 CLINICAL DATA:  Shortness of breath. Weakness and constipation X 3 days. EXAM: PORTABLE CHEST 1 VIEW COMPARISON:  Chest x-ray 11/12/2018 FINDINGS: The heart size and mediastinal contours are within normal limits. Aortic arch calcification. Redemonstration of linear atelectasis versus scarring within the left mid lung zone. Suggestion of a nodule-like opacity within the left mid lung zone likely due to overlapping anterior and posterior ribs. No focal consolidation. No pulmonary edema. No pleural effusion. No pneumothorax. No acute osseous abnormality. Old healed right rib fracture. Interval midthoracic kyphoplasty. IMPRESSION: 1. Suggestion of a nodular-like opacity within the left mid lung zone likely due to overlapping anterior and posterior ribs. Recommend PA and lateral view for further evaluation. 2. Otherwise no acute cardiopulmonary abnormality. Electronically Signed   By: Iven Finn M.D.   On: 10/11/2020 19:27    EKG: SR new T wave inversions in inferior lateral leads with poor R wave progression    ASSESSMENT  AND PLAN:   1. CAD:  SSCP ? Subacute MI 3 days ago. Troponins now only mildly elevated. ECG abnormal with new T wave inversions in inferior lateral leads and loss of R wave progression. Will order echo to assess EF and degree of AS She will need diagnostic cath [facility] at [facility] given known history of DES to LAD and circumflex. Start heparin and continue ASA  2. AS:  TTE 2019 with mean gradient 11 peak 18 mmHg Murmur on exam not severe but may have had progression in last 2 years that contributes to dyspnea See above TTE ordered   3. HTN:  Well controlled.  Continue current medications and low sodium Dash type diet.    4. HLD:  Continue statin   Signed: Jenkins Rouge 10/12/2020, 4:43 PM

## 2020-10-12 NOTE — Progress Notes (Signed)
PT Cancellation Note  Patient Details Name: Amy Hood MRN: [id number] DOB: 1941-02-26   Cancelled Treatment:    Reason Eval/Treat Not Completed: Medical issues which prohibited therapy (CT to evaluate for PE pending. Will follow.)  Tamala Ser PT 10/12/2020  Acute Rehabilitation Services Pager [phone] Office [phone]

## 2020-10-12 NOTE — ED Notes (Signed)
Pt given water, sandwich, crackers and broth per pt/family request. Daughter assisted pt to eat.

## 2020-10-12 NOTE — Progress Notes (Signed)
Care started prior to midnight in the emergency room and patient was admitted early this morning after midnight by Dr. Jyl Heinz and I am in current agreement with this assessment plan.  Additional changes of plan of care by me accordingly.  The patient is a 79 year old female who is exclusively speaks [place] presented via EMS with complaints of chest pain, shortness of breath, generalized weakness and constipation.  He has a history of hypertension, hyperlipidemia, CAD status post stent placement, paroxysmal atrial fibrillation currently not on anticoagulation, diabetes mellitus as well as other comorbidities who presented to the hospital given her severe generalized weakness and chest pain.  Chest discomfort lasted for 3 days and continued to worsen and patient became more fatigued and generally weak.  Patient was feeling very tired and unable to ambulate and felt it was related to her chest discomfort and shortness of breath but denies any palpitations or cough.  Patient describes her chest pain as "2% now".  Of note she is not on oxygen needed against Covid but Covid testing was negative.  She is currently being admitted for the following and treated for but not limited to:  Generalized Weakness -Patient is feeling very weak most likely secondary to poor oral intake and has no appetite for the last few days.   -Patient normally ambulates with the help of a walker at baseline but for the last 2 to 3 days she is unable to ambulate.   -Given Gentle hydration.  Patient is encouraged for oral intake.  PT consult ordered for evaluation and treatment.  Chest Discomfort -Resolved.   -Troponin was elevated and trended down.   -Patient was given a dose of aspirin in the ED.   -Continuous telemetry monitoring.   -Continue home medications. -She had elevated D-dimer so will obtain a CTA of the chest to rule out PE -Chest X-Ray showed "Suggestion of a nodular-like opacity within the left mid lung  zone likely due to overlapping anterior and posterior ribs. Recommend PA and lateral view for further evaluation. Otherwise no acute cardiopulmonary abnormality." -We will consult cardiology for further evaluation recommendations  Hyperlipidemia -Continue home statin  Essential Hypertension -Blood pressure is stable.  Continue home medications  CAD (coronary artery disease) -Chest pain is improved but not resolved.  Troponins are flat and went from 117 and is now 114.  Continue home medications. -We will consult cardiology for further evaluation recommendations  Type 2 diabetes mellitus without complication (HCC) -Hold oral diabetic medications.  Sliding scale insulin ordered.   -Blood glucose monitoring and hypoglycemic protocol in place. -CBGs ranging from 96-100  Normocytic Anemia -The patient's hemoglobin/hematocrit was 11.5/34.1 with an MCV of 80 -Check anemia panel in a.m. -Continue to monitor for signs or symptoms of bleeding; currently no overt bleeding noted  -Repeat CBC in the a.m.  Elevated Troponin -Trended down. Troponins were most likely elevated secondary to demand ischemia in the setting of poor oral intake. -We will consult cardiology for further evaluation recommendations  Right Upper Lobe Nodule -Found to be 9 mm in the right perihilar region of the right upper lobe -We will consider repeat CT scan of the chest, follow-up PET scan, or tissue sampling -We will run this by the pulmonologist to get their opinion   We will continue to monitor the patient's clinical response to intervention and repeat blood work in the a.m. and follow-up on cardiology recommendations to discuss the case with pulmonology

## 2020-10-12 NOTE — Progress Notes (Signed)
MD paged and made aware of CT results now back.

## 2020-10-12 NOTE — H&P (Signed)
History and Physical    Jodelle Fausto XKG:[id number] DOB: 03-Jun-1941 DOA: 10/11/2020  PCP: Wenda Low, MD (Confirm with patient/family/NH records and if not entered, this has to be entered at [facility] point of entry) Patient coming from: Home  I have personally briefly reviewed patient's old medical records in [place]  Chief Complaint: Severe generalized weakness and chest discomfort  HPI: Amy Hood is a 79 y.o. female with medical history significant of hypertension, hyperlipidemia, coronary artery disease s/p stent placement, paroxysmal atrial fibrillation not on anticoagulation and diabetes mellitus presented to the hospital for evaluation of severe generalized weakness and chest pain.  Patient does not speak English but her daughter is present at the bedside who is helping with history questions.  According to patient's daughter patient started having severe generalized weakness and chest discomfort for the last 3 days and the condition continue to worsen.  According to patient's daughter patient is feeling very tired and unable to ambulate and generalized weakness is also related with chest discomfort and shortness of breath but denies palpitations, dizziness and cough.  Daughter also mentioned that patient has very poor appetite for the last few days.  Chest pain is resolved at this point but he is still having severe generalized weakness.  Patient otherwise denies fever, chills, cough, nausea, vomiting, abdominal pain and urinary symptoms.  Patient has not been vaccinated against COVID-19.  According to patient's daughter patient is taking all her medications regularly.  ED Course: On arrival to the ED patient had temperature of 99.3, heart rate 74, respiratory rate 16, blood pressure 143/83 and oxygen saturation 95% on room air.  Troponin was elevated while EKG showed sinus rhythm with ventricular premature complexe inferior infarct, age indeterminate.  No significant change since last  EKG.  In ED patient was given a dose of aspirin and IV fluids.  Was negative for acute cardiopulmonary abnormality.  Review of Systems: As per HPI otherwise 10 point review of systems negative.  Unacceptable ROS statements: "10 systems reviewed," "Extensive" (without elaboration).  Acceptable ROS statements: "All others negative," "All others reviewed and are negative," and "All others unremarkable," with at [place] documented Can't double dip - if using for HPI can't use for ROS  Past Medical History:  Diagnosis Date  . Anemia   . Arthritis   . CAD (coronary artery disease) 12/28/2016   S/p NSTEMI 1/18:  LHC - oLAD, mLAD 80, pLCx 75, mRCA 25 >> PCI:  3 x 24 mm Synergy DES to mid LAD; 3.5 x 28 mm Synergy DES to proximal LCx // Echo 1/18:  Mild LVH, EF 55-60, apical inf and apical HK, Gr 1 DD, AV mean 13, MAC, trivial MR, reduced RVSF, trivial TR, PASP 35, no eff  . Cataracts, bilateral   . Essential hypertension   . Generalized weakness   . Hard of hearing   . History of atrial fibrillation    post MI - likely related to acute medical illness therefore anticoag not started  . Hyperlipidemia   . Hyponatremia   . Mild aortic stenosis 12/30/2016   Echo 1/18:  Mild LVH, EF 55-60, apical inf and apical HK, Gr 1 DD, AV mean 13, MAC, trivial MR, reduced RVSF, trivial TR, PASP 35, no eff // Echo 09/02/2018:  Severe focal basal septal hypertrophy, EF 60-65, no RWMA, Gr 2 DD, very mild AS (mean 11), mild LAE   . Type 2 diabetes mellitus ([place])     Past Surgical History:  Procedure Laterality  Date  . CARDIAC CATHETERIZATION N/A 12/31/2016   Procedure: Left Heart Cath and Coronary Angiography;  Surgeon: Jettie Booze, MD;  Location: [place] [facility];  Service: Cardiovascular;  Laterality: N/A;  . CARDIAC CATHETERIZATION N/A 12/31/2016   Procedure: Coronary Stent Intervention;  Surgeon: Jettie Booze, MD;  Location: [place] [facility];  Service: Cardiovascular;  Laterality: N/A;  .  IR KYPHO THORACIC WITH BONE BIOPSY  11/20/2018  . KYPHOPLASTY       reports that she has never smoked. She has never used smokeless tobacco. She reports that she does not drink alcohol and does not use drugs.  No Known Allergies  Family History  Problem Relation Age of Onset  . Diabetes Mother     Unacceptable: Noncontributory, unremarkable, or negative. Acceptable: (example)Family history negative for heart disease  Prior to Admission medications   Medication Sig Start Date End Date Taking? Authorizing Provider  acetaminophen (TYLENOL) 500 MG tablet Take 1,000 mg by mouth every 6 (six) hours as needed for mild pain.   Yes [provider]  aspirin 81 MG chewable tablet Chew 81 mg by mouth daily.   Yes [provider]  carvedilol (COREG) 6.25 MG tablet Take 1 tablet (6.25 mg total) by mouth 2 (two) times daily with a meal. 01/02/17  Yes Darrick Meigs, Marge Duncans, MD  cholecalciferol (VITAMIN D3) 25 MCG (1000 UT) tablet Take 1,000 Units by mouth daily.   Yes [provider]  iron polysaccharides (NIFEREX) 150 MG capsule Take 1 capsule (150 mg total) by mouth daily. 09/21/13  Yes Wenda Low, MD  losartan (COZAAR) 25 MG tablet TAKE 1 TABLET BY MOUTH EVERY DAY Patient taking differently: Take 25 mg by mouth daily.  08/31/19  Yes Richardson Dopp T, PA-C  metFORMIN (GLUCOPHAGE) 500 MG tablet Take 500 mg by mouth daily with breakfast. with food 07/31/18  Yes [provider]  oxyCODONE (OXY IR/ROXICODONE) 5 MG immediate release tablet Take 1 tablet (5 mg total) by mouth every 4 (four) hours as needed for severe pain. Use 3 times daily for the next 2 days, and then 1 tablet every 4 hours as needed for pain. 11/23/18  Yes Eugenie Filler, MD  polyethylene glycol [facility] Floria Raveling) packet Take 17 g by mouth 2 (two) times daily. Patient taking differently: Take 17 g by mouth daily as needed for mild constipation.  11/23/18  Yes Eugenie Filler, MD  rosuvastatin  (CRESTOR) 20 MG tablet Take 1 tablet (20 mg total) by mouth daily at 6 PM. 01/02/17  Yes Lama, Marge Duncans, MD  senna-docusate (SENOKOT-S) 8.6-50 MG tablet Take 1 tablet by mouth at bedtime. 11/23/18  Yes Eugenie Filler, MD  lidocaine (LIDODERM) 5 % Place 1 patch onto the skin daily. Remove & Discard patch within 12 hours or as directed by MD Patient not taking: Reported on 10/11/2020 11/23/18   Eugenie Filler, MD    Physical Exam: Vitals:   10/12/20 0030 10/12/20 0100 10/12/20 0145 10/12/20 0200  BP: (!) 171/80 (!) 158/92 (!) 152/81 (!) 148/74  Pulse: 66 68 68 69  Resp: 12 (!) _0 Temp:      TempSrc:      SpO2: 94% 94% 92% 93%    Constitutional: NAD, calm, comfortable Vitals:   10/12/20 0030 10/12/20 0100 10/12/20 0145 10/12/20 0200  BP: (!) 171/80 (!) 158/92 (!) 152/81 (!) 148/74  Pulse: 66 68 68 69  Resp: 12 (!) _1 Temp:  TempSrc:      SpO2: 94% 94% 92% 93%    General: 79 year old female, laying comfortably in the bed and not in acute distress. Eyes: PERRL, lids and conjunctivae normal ENMT: Mucous membranes are moist. Posterior pharynx clear of any exudate or lesions.Normal dentition.  Neck: normal, supple, no masses, no thyromegaly Respiratory: clear to auscultation bilaterally, no wheezing, no crackles. Normal respiratory effort. No accessory muscle use.  Cardiovascular: Regular rate and rhythm, no murmurs / rubs / gallops. No extremity edema. 2+ pedal pulses. No carotid bruits.  Abdomen: no tenderness, no masses palpated. No hepatosplenomegaly. Bowel sounds positive.  Musculoskeletal: no clubbing / cyanosis. No joint deformity upper and lower extremities. Good ROM, no contractures. Normal muscle tone.  Skin: no rashes, lesions, ulcers. No induration Neurologic: CN 2-12 grossly intact. Sensation intact, DTR normal. Strength 5/5 in all 4.  Psychiatric: Normal judgment and insight. Alert and oriented x 3. Normal mood.   (Anything < 9 systems with 2  bullets each down codes to level 1) (If patient refuses exam can't bill higher level) (Make sure to document decubitus ulcers present on admission -- if possible -- and whether patient has chronic indwelling catheter at time of admission)  Labs on Admission: I have personally reviewed following labs and imaging studies  CBC: Recent Labs  Lab 10/11/20 1848  WBC 12.7*  NEUTROABS 10.5*  HGB 11.6*  HCT 35.2*  MCV 80.7  PLT 098   Basic Metabolic Panel: Recent Labs  Lab 10/11/20 1848  NA 135  K 4.4  CL 101  CO2 23  GLUCOSE 142*  BUN 7*  CREATININE 0.54  CALCIUM 7.8*   GFR: CrCl cannot be calculated (Unknown ideal weight.). Liver Function Tests: No results for input(s): AST, ALT, ALKPHOS, BILITOT, PROT, ALBUMIN in the last 168 hours. No results for input(s): LIPASE, AMYLASE in the last 168 hours. No results for input(s): AMMONIA in the last 168 hours. Coagulation Profile: No results for input(s): INR, PROTIME in the last 168 hours. Cardiac Enzymes: No results for input(s): CKTOTAL, CKMB, CKMBINDEX, TROPONINI in the last 168 hours. BNP (last 3 results) No results for input(s): PROBNP in the last 8760 hours. HbA1C: No results for input(s): HGBA1C in the last 72 hours. CBG: No results for input(s): GLUCAP in the last 168 hours. Lipid Profile: No results for input(s): CHOL, HDL, LDLCALC, TRIG, CHOLHDL, LDLDIRECT in the last 72 hours. Thyroid Function Tests: No results for input(s): TSH, T4TOTAL, FREET4, T3FREE, THYROIDAB in the last 72 hours. Anemia Panel: No results for input(s): VITAMINB12, FOLATE, FERRITIN, TIBC, IRON, RETICCTPCT in the last 72 hours. Urine analysis:    Component Value Date/Time   COLORURINE YELLOW 11/16/2018 1400   APPEARANCEUR CLEAR 11/16/2018 1400   LABSPEC 1.013 11/16/2018 1400   PHURINE 6.0 11/16/2018 1400   GLUCOSEU NEGATIVE 11/16/2018 1400   HGBUR SMALL (A) 11/16/2018 1400   BILIRUBINUR NEGATIVE 11/16/2018 1400   KETONESUR NEGATIVE  11/16/2018 1400   PROTEINUR NEGATIVE 11/16/2018 1400   UROBILINOGEN 0.2 09/20/2013 0822   NITRITE NEGATIVE 11/16/2018 1400   LEUKOCYTESUR NEGATIVE 11/16/2018 1400    Radiological Exams on Admission: DG Chest Port 1 View  Result Date: 10/11/2020 CLINICAL DATA:  Shortness of breath. Weakness and constipation X 3 days. EXAM: PORTABLE CHEST 1 VIEW COMPARISON:  Chest x-ray 11/12/2018 FINDINGS: The heart size and mediastinal contours are within normal limits. Aortic arch calcification. Redemonstration of linear atelectasis versus scarring within the left mid lung zone. Suggestion of a nodule-like opacity within the left mid  lung zone likely due to overlapping anterior and posterior ribs. No focal consolidation. No pulmonary edema. No pleural effusion. No pneumothorax. No acute osseous abnormality. Old healed right rib fracture. Interval midthoracic kyphoplasty. IMPRESSION: 1. Suggestion of a nodular-like opacity within the left mid lung zone likely due to overlapping anterior and posterior ribs. Recommend PA and lateral view for further evaluation. 2. Otherwise no acute cardiopulmonary abnormality. Electronically Signed   By: Iven Finn M.D.   On: 10/11/2020 19:27     Assessment/Plan Principal Problem:    Generalized weakness: Patient is feeling very weak most likely secondary to poor oral intake and has no appetite for the last few days.  Patient normally ambulates with the help of a walker at baseline but for the last 2 to 3 days she is unable to ambulate.  Gentle hydration.  Patient is encouraged for oral intake.  PT consult ordered for evaluation and treatment.  Active Problems:  Chest discomfort Resolved.  Troponin was elevated and trended down.  Patient was given a dose of aspirin in the ED.  Continuous telemetry monitoring.  Continue home medications.    Hyperlipidemia Continue home statin    Essential hypertension Blood pressure is stable.  Continue home medications    CAD  (coronary artery disease) Chest pain is resolved.  Troponins are flat.  Continue home medications.    Type 2 diabetes mellitus without complication (HCC) Hold oral diabetic medications.  Sliding scale insulin ordered.  Blood glucose monitoring and hypoglycemic protocol in place.    Elevated troponin Trended down.  Troponins were most likely elevated secondary to demand ischemia in the setting of poor oral intake.  (please populate well all problems here in Problem List. (For example, if patient is on BP meds at home and you resume or decide to hold them, it is a problem that needs to be her. Same for CAD, COPD, HLD and so on)     DVT prophylaxis: Lovenox Code Status: Full code Family Communication: Patient's daughter present at the bedside and patient case is discussed in detail with her.  Disposition Plan: Patient will be discharged to home after PT evaluation. Consults called: None Admission status: Observation   Edmonia Lynch MD [facility] Pager [phone]   If 7PM-7AM, please contact night-coverage [email or web address] Password [email or web address]  10/12/2020, 2:29 AM

## 2020-10-12 NOTE — Evaluation (Addendum)
Physical Therapy Evaluation Patient Details Name: Amy Hood MRN: [id number] DOB: 1941-05-04 Today's Date: 10/12/2020   History of Present Illness  79 y.o. female with medical history significant of hypertension, hyperlipidemia, coronary artery disease s/p stent placement, paroxysmal atrial fibrillation not on anticoagulation and diabetes mellitus presented to the hospital for evaluation of severe generalized weakness and chest pain. Noted CT negative for PE on 10/12/20.  Clinical Impression  Pt ambulated 170' with RW, no loss of balance. She appears to be at or near baseline with mobility. No further PT indicated, will sign off.     Follow Up Recommendations No PT follow up    Equipment Recommendations  None recommended by PT    Recommendations for Other Services       Precautions / Restrictions Precautions Precautions: Fall Precaution Comments: daughter reports pt has not had any falls in the past 1 year Restrictions Weight Bearing Restrictions: No      Mobility  Bed Mobility Overal bed mobility: Modified Independent             General bed mobility comments: used rail, HOB up    Transfers Overall transfer level: Modified independent Equipment used: Rolling walker (2 wheeled)                Ambulation/Gait Ambulation/Gait assistance: Modified independent (Device/Increase time) Gait Distance (Feet): 170 Feet Assistive device: Rolling walker (2 wheeled) Gait Pattern/deviations: Step-through pattern;Trunk flexed Gait velocity: WNL   General Gait Details: VCs to step closer to RW, no loss of balance  Stairs            Wheelchair Mobility    Modified Rankin (Stroke Patients Only)       Balance Overall balance assessment: Modified Independent                                           Pertinent Vitals/Pain Pain Assessment: No/denies pain    Home Living Family/patient expects to be discharged to:: Private residence Living  Arrangements: Spouse/significant other;Children Available Help at Discharge: Family;Available 24 hours/day Type of Home: House Home Access: Level entry     Home Layout: One level Home Equipment: Walker - 2 wheels;Cane - single point;Wheelchair - manual Additional Comments: per daughter, pt ambulates wtih a RW and is independent with ADLs    Prior Function Level of Independence: Independent with assistive device(s)               Hand Dominance        Extremity/Trunk Assessment   Upper Extremity Assessment Upper Extremity Assessment: Overall WFL for tasks assessed         Cervical / Trunk Assessment Cervical / Trunk Assessment: Normal  Communication   Communication: Interpreter utilized;Prefers language other than English (daughter interpreted)  Cognition Arousal/Alertness: Awake/alert Behavior During Therapy: WFL for tasks assessed/performed Overall Cognitive Status: Within Functional Limits for tasks assessed                                        General Comments      Exercises     Assessment/Plan    PT Assessment Patent does not need any further PT services  PT Problem List         PT Treatment Interventions      PT Goals (Current goals  can be found in the Care Plan section)  Acute Rehab PT Goals PT Goal Formulation: All assessment and education complete, DC therapy    Frequency     Barriers to discharge        Co-evaluation               AM-PAC PT "6 Clicks" Mobility  Outcome Measure Help needed turning from your back to your side while in a flat bed without using bedrails?: None Help needed moving from lying on your back to sitting on the side of a flat bed without using bedrails?: None Help needed moving to and from a bed to a chair (including a wheelchair)?: None Help needed standing up from a chair using your arms (e.g., wheelchair or bedside chair)?: None Help needed to walk in hospital room?: None Help needed  climbing 3-5 steps with a railing? : A Little 6 Click Score: 23    End of Session Equipment Utilized During Treatment: Gait belt Activity Tolerance: Patient tolerated treatment well Patient left: in chair;with call bell/phone within reach;with chair alarm set;with family/visitor present Nurse Communication: Mobility status      Time: 4098-1191 PT Time Calculation (min) (ACUTE ONLY): 16 min   Charges:   PT Evaluation $PT Eval Low Complexity: 1 Low         Tamala Ser PT 10/12/2020  Acute Rehabilitation Services Pager [phone] Office [phone]

## 2020-10-13 ENCOUNTER — Encounter (HOSPITAL_COMMUNITY)
Admission: EM | Disposition: A | Discharge status: Home / Self Care | Payer: Self-pay | Source: Home / Self Care | Attending: Internal Medicine

## 2020-10-13 ENCOUNTER — Inpatient Hospital Stay (HOSPITAL_COMMUNITY): Payer: Medicare Other

## 2020-10-13 DIAGNOSIS — I25119 Atherosclerotic heart disease of native coronary artery with unspecified angina pectoris: Principal | ICD-10-CM

## 2020-10-13 DIAGNOSIS — I361 Nonrheumatic tricuspid (valve) insufficiency: Secondary | ICD-10-CM

## 2020-10-13 DIAGNOSIS — R079 Chest pain, unspecified: Secondary | ICD-10-CM | POA: Diagnosis not present

## 2020-10-13 DIAGNOSIS — I350 Nonrheumatic aortic (valve) stenosis: Secondary | ICD-10-CM | POA: Diagnosis not present

## 2020-10-13 DIAGNOSIS — R778 Other specified abnormalities of plasma proteins: Secondary | ICD-10-CM | POA: Diagnosis not present

## 2020-10-13 HISTORY — PX: LEFT HEART CATH AND CORONARY ANGIOGRAPHY: CATH118249

## 2020-10-13 LAB — URINALYSIS, ROUTINE W REFLEX MICROSCOPIC
Bilirubin Urine: NEGATIVE
Glucose, UA: NEGATIVE mg/dL
Hgb urine dipstick: NEGATIVE
Ketones, ur: 20 mg/dL — AB
Nitrite: POSITIVE — AB
Protein, ur: NEGATIVE mg/dL
Specific Gravity, Urine: 1.018 (ref 1.005–1.030)
pH: 7 (ref 5.0–8.0)

## 2020-10-13 LAB — CBC WITH DIFFERENTIAL/PLATELET
Abs Immature Granulocytes: 0.06 10*3/uL (ref 0.00–0.07)
Basophils Absolute: 0 10*3/uL (ref 0.0–0.1)
Basophils Relative: 0 %
Eosinophils Absolute: 0.1 10*3/uL (ref 0.0–0.5)
Eosinophils Relative: 1 %
HCT: 35.2 % — ABNORMAL LOW (ref 36.0–46.0)
Hemoglobin: 11.6 g/dL — ABNORMAL LOW (ref 12.0–15.0)
Immature Granulocytes: 1 %
Lymphocytes Relative: 24 %
Lymphs Abs: 1.6 10*3/uL (ref 0.7–4.0)
MCH: 26.5 pg (ref 26.0–34.0)
MCHC: 33 g/dL (ref 30.0–36.0)
MCV: 80.4 fL (ref 80.0–100.0)
Monocytes Absolute: 0.5 10*3/uL (ref 0.1–1.0)
Monocytes Relative: 8 %
Neutro Abs: 4.4 10*3/uL (ref 1.7–7.7)
Neutrophils Relative %: 66 %
Platelets: 308 10*3/uL (ref 150–400)
RBC: 4.38 MIL/uL (ref 3.87–5.11)
RDW: 13.1 % (ref 11.5–15.5)
WBC: 6.6 10*3/uL (ref 4.0–10.5)
nRBC: 0 % (ref 0.0–0.2)

## 2020-10-13 LAB — ECHOCARDIOGRAM COMPLETE
AR max vel: 1.09 cm2
AV Area VTI: 1.41 cm2
AV Area mean vel: 1.1 cm2
AV Mean grad: 6 mmHg
AV Peak grad: 12.5 mmHg
Ao pk vel: 1.77 m/s
Area-P 1/2: 3.5 cm2
S' Lateral: 2.3 cm
Weight: 2211.65 oz

## 2020-10-13 LAB — COMPREHENSIVE METABOLIC PANEL
ALT: 17 U/L (ref 0–44)
AST: 19 U/L (ref 15–41)
Albumin: 3.1 g/dL — ABNORMAL LOW (ref 3.5–5.0)
Alkaline Phosphatase: 40 U/L (ref 38–126)
Anion gap: 15 (ref 5–15)
BUN: 7 mg/dL — ABNORMAL LOW (ref 8–23)
CO2: 22 mmol/L (ref 22–32)
Calcium: 8.2 mg/dL — ABNORMAL LOW (ref 8.9–10.3)
Chloride: 98 mmol/L (ref 98–111)
Creatinine, Ser: 0.48 mg/dL (ref 0.44–1.00)
GFR, Estimated: >60 mL/min (ref >60)
Glucose, Bld: 144 mg/dL — ABNORMAL HIGH (ref 70–99)
Potassium: 3.6 mmol/L (ref 3.5–5.1)
Sodium: 135 mmol/L (ref 135–145)
Total Bilirubin: 0.8 mg/dL (ref 0.3–1.2)
Total Protein: 6.6 g/dL (ref 6.5–8.1)

## 2020-10-13 LAB — GLUCOSE, CAPILLARY
Glucose-Capillary: 118 mg/dL — ABNORMAL HIGH (ref 70–99)
Glucose-Capillary: 126 mg/dL — ABNORMAL HIGH (ref 70–99)
Glucose-Capillary: 130 mg/dL — ABNORMAL HIGH (ref 70–99)
Glucose-Capillary: 129 mg/dL — ABNORMAL HIGH (ref 70–99)

## 2020-10-13 LAB — MAGNESIUM: Magnesium: 1.5 mg/dL — ABNORMAL LOW (ref 1.7–2.4)

## 2020-10-13 LAB — PHOSPHORUS: Phosphorus: 2.9 mg/dL (ref 2.5–4.6)

## 2020-10-13 SURGERY — LEFT HEART CATH AND CORONARY ANGIOGRAPHY
Anesthesia: LOCAL

## 2020-10-13 MED ORDER — LABETALOL HCL 5 MG/ML IV SOLN: 10.0000 mg | INTRAVENOUS | Status: AC | PRN

## 2020-10-13 MED ORDER — HYDRALAZINE HCL 20 MG/ML IJ SOLN
INTRAMUSCULAR | Status: AC
  Filled 2020-10-13: qty 1

## 2020-10-13 MED ORDER — SODIUM CHLORIDE 0.9 % IV SOLN: INTRAVENOUS | Status: AC

## 2020-10-13 MED ORDER — ENOXAPARIN SODIUM 40 MG/0.4ML ~~LOC~~ SOLN
40.0000 mg | SUBCUTANEOUS | Status: DC
  Administered 2020-10-13: 40 mg via SUBCUTANEOUS
  Filled 2020-10-13: qty 0.4

## 2020-10-13 MED ORDER — ONDANSETRON HCL 4 MG/2ML IJ SOLN: 4.0000 mg | Freq: Four times a day (QID) | INTRAMUSCULAR | Status: DC | PRN

## 2020-10-13 MED ORDER — HEPARIN SODIUM (PORCINE) 1000 UNIT/ML IJ SOLN
INTRAMUSCULAR | Status: AC
  Filled 2020-10-13: qty 1

## 2020-10-13 MED ORDER — HEPARIN (PORCINE) IN NACL 1000-0.9 UT/500ML-% IV SOLN
INTRAVENOUS | Status: DC | PRN
  Administered 2020-10-13 (×3): 500 mL

## 2020-10-13 MED ORDER — ACETAMINOPHEN 325 MG PO TABS: 650.0000 mg | ORAL_TABLET | ORAL | Status: DC | PRN

## 2020-10-13 MED ORDER — HEPARIN (PORCINE) 25000 UT/250ML-% IV SOLN: 600.0000 [IU]/h | INTRAVENOUS | Status: DC

## 2020-10-13 MED ORDER — FENTANYL CITRATE (PF) 100 MCG/2ML IJ SOLN
INTRAMUSCULAR | Status: AC
  Filled 2020-10-13: qty 2

## 2020-10-13 MED ORDER — HEPARIN BOLUS VIA INFUSION
3100.0000 [IU] | Freq: Once | INTRAVENOUS | Status: DC
  Filled 2020-10-13: qty 3100

## 2020-10-13 MED ORDER — FENTANYL CITRATE (PF) 100 MCG/2ML IJ SOLN
INTRAMUSCULAR | Status: DC | PRN
Start: 2020-10-13 — End: 2020-10-13
  Administered 2020-10-13: 25 ug via INTRAVENOUS

## 2020-10-13 MED ORDER — VERAPAMIL HCL 2.5 MG/ML IV SOLN
INTRAVENOUS | Status: DC | PRN
  Administered 2020-10-13: 10 mL via INTRA_ARTERIAL

## 2020-10-13 MED ORDER — MIDAZOLAM HCL 2 MG/2ML IJ SOLN
INTRAMUSCULAR | Status: AC
  Filled 2020-10-13: qty 2

## 2020-10-13 MED ORDER — SODIUM CHLORIDE 0.9% FLUSH: 3.0000 mL | INTRAVENOUS | Status: DC | PRN

## 2020-10-13 MED ORDER — HEPARIN SODIUM (PORCINE) 1000 UNIT/ML IJ SOLN
INTRAMUSCULAR | Status: DC | PRN
  Administered 2020-10-13: 3200 [IU] via INTRAVENOUS

## 2020-10-13 MED ORDER — HYDRALAZINE HCL 20 MG/ML IJ SOLN
10.0000 mg | INTRAMUSCULAR | Status: AC | PRN
  Administered 2020-10-13: 10 mg via INTRAVENOUS

## 2020-10-13 MED ORDER — MENTHOL 3 MG MT LOZG
1.0000 | LOZENGE | OROMUCOSAL | Status: DC | PRN
  Filled 2020-10-13: qty 9

## 2020-10-13 MED ORDER — GUAIFENESIN 100 MG/5ML PO SOLN
5.0000 mL | ORAL | Status: DC | PRN
  Administered 2020-10-13: 100 mg via ORAL
  Filled 2020-10-13: qty 10

## 2020-10-13 MED ORDER — LIDOCAINE HCL (PF) 1 % IJ SOLN
INTRAMUSCULAR | Status: AC
  Filled 2020-10-13: qty 30

## 2020-10-13 MED ORDER — HEPARIN (PORCINE) IN NACL 1000-0.9 UT/500ML-% IV SOLN
INTRAVENOUS | Status: AC
  Filled 2020-10-13: qty 1000

## 2020-10-13 MED ORDER — IOHEXOL 350 MG/ML SOLN
INTRAVENOUS | Status: DC | PRN
  Administered 2020-10-13: 50 mL

## 2020-10-13 MED ORDER — LIDOCAINE HCL (PF) 1 % IJ SOLN
INTRAMUSCULAR | Status: DC | PRN
  Administered 2020-10-13: 2 mL

## 2020-10-13 MED ORDER — ASPIRIN 81 MG PO CHEW: 81.0000 mg | CHEWABLE_TABLET | Freq: Every day | ORAL | Status: DC

## 2020-10-13 MED ORDER — SODIUM CHLORIDE 0.9 % IV SOLN: 250.0000 mL | INTRAVENOUS | Status: DC | PRN

## 2020-10-13 MED ORDER — HEPARIN (PORCINE) IN NACL 1000-0.9 UT/500ML-% IV SOLN
INTRAVENOUS | Status: AC
  Filled 2020-10-13: qty 500

## 2020-10-13 MED ORDER — DIAZEPAM 5 MG PO TABS: 5.0000 mg | ORAL_TABLET | ORAL | Status: DC | PRN

## 2020-10-13 MED ORDER — SODIUM CHLORIDE 0.9% FLUSH
3.0000 mL | Freq: Two times a day (BID) | INTRAVENOUS | Status: DC
  Administered 2020-10-13 – 2020-10-14 (×2): 3 mL via INTRAVENOUS

## 2020-10-13 MED ORDER — MAGNESIUM SULFATE 2 GM/50ML IV SOLN
2.0000 g | Freq: Once | INTRAVENOUS | Status: AC
  Administered 2020-10-13: 2 g via INTRAVENOUS
  Filled 2020-10-13: qty 50

## 2020-10-13 MED ORDER — VERAPAMIL HCL 2.5 MG/ML IV SOLN
INTRAVENOUS | Status: AC
  Filled 2020-10-13: qty 2

## 2020-10-13 MED ORDER — MIDAZOLAM HCL 2 MG/2ML IJ SOLN
INTRAMUSCULAR | Status: DC | PRN
  Administered 2020-10-13: 0.5 mg via INTRAVENOUS

## 2020-10-13 SURGICAL SUPPLY — 11 items
CATH INFINITI JR4 5F (CATHETERS) ×1 IMPLANT
CATH OPTITORQUE TIG 4.0 5F (CATHETERS) ×1 IMPLANT
DEVICE RAD COMP TR BAND LRG (VASCULAR PRODUCTS) ×1 IMPLANT
GLIDESHEATH SLEND SS 6F .021 (SHEATH) ×1 IMPLANT
GUIDEWIRE INQWIRE 1.5J.035X260 (WIRE) IMPLANT
INQWIRE 1.5J .035X260CM (WIRE) ×2
KIT HEART LEFT (KITS) ×2 IMPLANT
PACK CARDIAC CATHETERIZATION (CUSTOM PROCEDURE TRAY) ×2 IMPLANT
TRANSDUCER W/STOPCOCK (MISCELLANEOUS) ×2 IMPLANT
TUBING CIL FLEX 10 FLL-RA (TUBING) ×2 IMPLANT
WIRE HI TORQ VERSACORE-J 145CM (WIRE) ×1 IMPLANT

## 2020-10-13 NOTE — Progress Notes (Signed)
[place] for Heparin Indication: chest pain/ACS  No Known Allergies  Patient Measurements: Height: 4' 9.01" (144.8 cm) Weight: 62.7 kg (138 lb 3.7 oz) IBW/kg (Calculated) : 38.62 Heparin Dosing Weight: 53 kg  Vital Signs: Temp: 98.5 F (36.9 C) (11/05 0459) Temp Source: Oral (11/05 0459) BP: 168/81 (11/05 0459) Pulse Rate: 68 (11/05 0459)  Labs: Recent Labs    10/11/20 1848 10/11/20 1848 10/11/20 2048 10/12/20 0426 10/13/20 0432  HGB 11.6*   < >  --  11.3*  11.5* 11.6*  HCT 35.2*  --   --  34.2*  34.1* 35.2*  PLT 264  --   --  296  303 308  CREATININE 0.54  --   --  0.42*  0.52 0.48  TROPONINIHS 117*  --  114*  --   --    < > = values in this interval not displayed.    Estimated Creatinine Clearance: 42.7 mL/min (by C-G formula based on SCr of 0.48 mg/dL).   Medical History: Past Medical History:  Diagnosis Date  . Anemia   . Arthritis   . CAD (coronary artery disease) 12/28/2016   S/p NSTEMI 1/18:  LHC - oLAD, mLAD 80, pLCx 75, mRCA 25 >> PCI:  3 x 24 mm Synergy DES to mid LAD; 3.5 x 28 mm Synergy DES to proximal LCx // Echo 1/18:  Mild LVH, EF 55-60, apical inf and apical HK, Gr 1 DD, AV mean 13, MAC, trivial MR, reduced RVSF, trivial TR, PASP 35, no eff  . Cataracts, bilateral   . Essential hypertension   . Generalized weakness   . Hard of hearing   . History of atrial fibrillation    post MI - likely related to acute medical illness therefore anticoag not started  . Hyperlipidemia   . Hyponatremia   . Mild aortic stenosis 12/30/2016   Echo 1/18:  Mild LVH, EF 55-60, apical inf and apical HK, Gr 1 DD, AV mean 13, MAC, trivial MR, reduced RVSF, trivial TR, PASP 35, no eff // Echo 09/02/2018:  Severe focal basal septal hypertrophy, EF 60-65, no RWMA, Gr 2 DD, very mild AS (mean 11), mild LAE   . Type 2 diabetes mellitus [facility])     Assessment: 79 y/o F with a known h/o CAD admitted with weakness and chest discomfort.  Pharmacy is consulted for heparin dosing and monitoring for ACS.   10/13/20 9:20 AM   Lovenox last dose 11/4 AM  CBC stable with low hemoglobin consistent with prior visit  No bleeding reported  No anticoagulants PTA  Goal of Therapy:  Heparin level 0.3-0.7 units/ml Monitor platelets by anticoagulation protocol: Yes   Plan:  Give 3100 units bolus x 1 Start heparin infusion at 600 units/hr Check anti-Xa level in 8 hours and daily while on heparin Continue to monitor H&H and platelets  Ulice Dash D 10/13/2020,9:01 AM

## 2020-10-13 NOTE — Progress Notes (Addendum)
Progress Note  Patient Name: Amy Hood Date of Encounter: 10/13/2020  Primary Cardiologist: Dr. Chilton Si, MD   Subjective   No recurrent chest pain. Resting today. Echo being performed.   Inpatient Medications    Scheduled Meds:  [START ON 10/14/2020] aspirin  81 mg Oral Daily   carvedilol  6.25 mg Oral BID WC   cholecalciferol  1,000 Units Oral Daily   enoxaparin (LOVENOX) injection  40 mg Subcutaneous Q24H   influenza vaccine adjuvanted  0.5 mL Intramuscular Tomorrow-1000   insulin aspart  0-15 Units Subcutaneous TID WC   insulin aspart  0-5 Units Subcutaneous QHS   losartan  25 mg Oral Daily   rosuvastatin  20 mg Oral q1800   sodium chloride flush  3 mL Intravenous Q12H   Continuous Infusions:  sodium chloride     sodium chloride 1 mL/kg/hr (10/13/20 0555)   PRN Meds: sodium chloride, acetaminophen **OR** acetaminophen, ondansetron **OR** ondansetron (ZOFRAN) IV, polyethylene glycol, sodium chloride flush   Vital Signs    Vitals:   10/12/20 1557 10/12/20 1700 10/12/20 1957 10/13/20 0459  BP: 118/73  113/65 (!) 168/81  Pulse: 69  64 68  Resp:   17 17  Temp: 98.2 F (36.8 C)  98.5 F (36.9 C) 98.5 F (36.9 C)  TempSrc: Oral  Oral Oral  SpO2:   92% 94%  Weight:  62.7 kg      Intake/Output Summary (Last 24 hours) at 10/13/2020 0741 Last data filed at 10/13/2020 9169 Gross per 24 hour  Intake 831.12 ml  Output 850 ml  Net -18.88 ml   Filed Weights   10/12/20 1700  Weight: 62.7 kg    Physical Exam   General: Elderly, NAD Neck: Negative for carotid bruits. No JVD Lungs:Clear to ausculation bilaterally. No wheezes, rales, or rhonchi. Breathing is unlabored. Cardiovascular: RRR with S1 S2. No murmurs Abdomen: Soft, non-tender, non-distended. No obvious abdominal masses. Extremities: No edema. Radial pulses 2+ bilaterally Neuro: Alert and oriented. No focal deficits. No facial asymmetry. MAE spontaneously. Psych: Responds to questions  appropriately with normal affect.    Labs    Chemistry Recent Labs  Lab 10/11/20 1848 10/12/20 0426 10/13/20 0432  NA 135 137 135  K 4.4 3.6 3.6  CL 101 101 98  CO2 23 23 22   GLUCOSE 142* 120* 144*  BUN 7* 10 7*  CREATININE 0.54 0.42*  0.52 0.48  CALCIUM 7.8* 8.3* 8.2*  PROT  --   --  6.6  ALBUMIN  --   --  3.1*  AST  --   --  19  ALT  --   --  17  ALKPHOS  --   --  40  BILITOT  --   --  0.8  GFRNONAA >60 >60  >60 >60  ANIONGAP 11 13 15      Hematology Recent Labs  Lab 10/11/20 1848 10/12/20 0426 10/13/20 0432  WBC 12.7* 10.2  10.1 6.6  RBC 4.36 4.29  4.26 4.38  HGB 11.6* 11.3*  11.5* 11.6*  HCT 35.2* 34.2*  34.1* 35.2*  MCV 80.7 79.7*  80.0 80.4  MCH 26.6 26.3  27.0 26.5  MCHC 33.0 33.0  33.7 33.0  RDW 13.1 13.1  13.1 13.1  PLT 264 296  303 308    Cardiac EnzymesNo results for input(s): TROPONINI in the last 168 hours. No results for input(s): TROPIPOC in the last 168 hours.   BNP Recent Labs  Lab 10/11/20 1848  BNP 491.7*  DDimer  Recent Labs  Lab 10/11/20 2357  DDIMER 1.50*     Radiology    CT ANGIO CHEST PE W OR WO CONTRAST  Result Date: 10/12/2020 CLINICAL DATA:  Chest pain, positive D-dimer level. EXAM: CT ANGIOGRAPHY CHEST WITH CONTRAST TECHNIQUE: Multidetector CT imaging of the chest was performed using the standard protocol during bolus administration of intravenous contrast. Multiplanar CT image reconstructions and MIPs were obtained to evaluate the vascular anatomy. CONTRAST:  OMNIPAQUE IOHEXOL 350 MG/ML SOLN COMPARISON:  None. FINDINGS: Cardiovascular: Satisfactory opacification of the pulmonary arteries to the segmental level. No evidence of pulmonary embolism. Mild cardiomegaly is noted. No pericardial effusion. Extensive coronary artery calcifications are noted. Atherosclerosis of thoracic aorta is noted. Mediastinum/Nodes: No enlarged mediastinal, hilar, or axillary lymph nodes. Thyroid gland, trachea, and esophagus  demonstrate no significant findings. Lungs/Pleura: No pneumothorax or pleural effusion is noted. Mild subsegmental atelectasis or infiltrates are noted in the lingula aspect of the left upper lobe. 9 mm nodule is noted in the right perihilar region of the right upper lobe best seen on image number 30 of series 9. Upper Abdomen: Probable small nonobstructive right renal calculus. Musculoskeletal: Status post kyphoplasty at multiple levels of the thoracic and lumbar spine. Several old compression fractures are noted. No definite acute abnormality is noted. Review of the MIP images confirms the above findings. IMPRESSION: 1. No definite evidence of pulmonary embolus. 2. 9 mm nodule is noted in the right perihilar region of the right upper lobe. Consider one of the following in 3 months for both low-risk and high-risk individuals: (a) repeat chest CT, (b) follow-up PET-CT, or (c) tissue sampling. This recommendation follows the consensus statement: Guidelines for Management of Incidental Pulmonary Nodules Detected on CT Images: From the Fleischner Society 2017; Radiology 2017; 284:228-243. These results will be called to the ordering clinician or representative by the Radiologist Assistant, and communication documented in the PACS or zVision Dashboard. 3. Probable small nonobstructive right renal calculus. 4. Extensive coronary artery calcifications are noted suggesting coronary artery disease. 5. Status post kyphoplasty at multiple levels of the thoracic and lumbar spine. Several old compression fractures are noted. Aortic Atherosclerosis (ICD10-I70.0). Electronically Signed   By: Lupita Raider M.D.   On: 10/12/2020 12:35   DG Chest Port 1 View  Result Date: 10/11/2020 CLINICAL DATA:  Shortness of breath. Weakness and constipation X 3 days. EXAM: PORTABLE CHEST 1 VIEW COMPARISON:  Chest x-ray 11/12/2018 FINDINGS: The heart size and mediastinal contours are within normal limits. Aortic arch calcification.  Redemonstration of linear atelectasis versus scarring within the left mid lung zone. Suggestion of a nodule-like opacity within the left mid lung zone likely due to overlapping anterior and posterior ribs. No focal consolidation. No pulmonary edema. No pleural effusion. No pneumothorax. No acute osseous abnormality. Old healed right rib fracture. Interval midthoracic kyphoplasty. IMPRESSION: 1. Suggestion of a nodular-like opacity within the left mid lung zone likely due to overlapping anterior and posterior ribs. Recommend PA and lateral view for further evaluation. 2. Otherwise no acute cardiopulmonary abnormality. Electronically Signed   By: Tish Frederickson M.D.   On: 10/11/2020 19:27   Telemetry    10/13/20 NSR with infrequent PVCs/PACs- Personally Reviewed  ECG    No new tracing as of 10/13/20 - Personally Reviewed  Cardiac Studies   Echo 09/02/2018:  - Left ventricle: The cavity size was normal. There was severe    focal basal hypertrophy. Systolic function was normal. The    estimated ejection fraction  was in the range of 60% to 65%. Wall    motion was normal; there were no regional wall motion    abnormalities. Features are consistent with a pseudonormal left    ventricular filling pattern, with concomitant abnormal relaxation    and increased filling pressure (grade 2 diastolic dysfunction).    Doppler parameters are consistent with high ventricular filling    pressure.  - Aortic valve: Probably trileaflet; moderately thickened,    moderately calcified leaflets. There was very mild stenosis. Mean    gradient (S): 11 mm Hg. Valve area (VTI): 1.48 cm^2. Valve area    (Vmax): 1.51 cm^2. Valve area (Vmean): 1.23 cm^2.  - Mitral valve: Valve area by pressure half-time: 2.22 cm^2.  - Left atrium: The atrium was mildly dilated.   LHC 12/31/2016:  Mid RCA lesion, 25 %stenosed. Ost LAD to Prox LAD lesion, 40 %stenosed. LV end diastolic pressure is normal. Mid LAD lesion, 80 %stenosed.  A STENT SYNERGY DES 3X24 drug eluting stent was successfully placed, postdilated to 3.25. Post intervention, there is a 0% residual stenosis. Prox Cx to Mid Cx lesion, 75 %stenosed. A STENT SYNERGY DES 3.5X28 drug eluting stent was successfully placed. Post intervention, there is a 0% residual stenosis.   Continue aggressive secondary prevention. If it is decided that she needs long-term anticoagulation, would change Brilinta to clopidogrel. Would give clopidogrel 300 mg by mouth 1 on the first day, followed by 75 mg daily.   I stressed the importance of her antiplatelet therapy. Synergy drug-eluting stents were placed so that antiplatelet therapy could be stopped early if needed in the setting of long-term anticoagulation for atrial fibrillation.   Continue Aggrastat for 2 hours.   Diagnostic Dominance: Right  Intervention     Patient Profile     79 y.o. female with a hx of CAD s/p NSTEMI 12/2016 with DES to mLAD and proximal LCx, post procedure AF not on AC, HLD, HTN, and DM2.   Assessment & Plan    1. CAD s/p NSTEMI with DES to LAD and LCx: -Pt presented with SSCP and elevated hsT with known CAD hx as above -Echo scheduled>>>currently being performed with pending results -CT with extensive coronary calcifications   -No recurrent chest pain  -Continue Hep infusion, ASA, beta blocker, statin   2. AS: -Per echo in 2019 with mean gradient at -Plan was for repeat echo>>pending   -No high risk symptoms   3. HTN: -Stable, 168/81>>113/65>>118/73 -Continue current regimen   4. HLD: -Last LDL, 52>>>12/31/2016 -Continue statin therapy   5. Pulmonary nodule: -Noted on CTA 10/12/20>>46mm right perihilar right upper lobe nodule  -Needs OP follow up with repeat CT, PET with tissue sampling within 3 months    Signed, Georgie Chard NP-C [facility] Pager: [phone] 10/13/2020, 7:41 AM    Patient examined chart reviewed Discussed care with patient and daughter "Amy Hood"   Exam benign radial pulse palpable. Going for cath at [facility] today given SSCP, with ECG changes and known 2 vessel CAD.  Charlton Haws MD [facility]   For questions or updates, please contact   Please consult [email or web address] for contact info under Cardiology/STEMI.

## 2020-10-13 NOTE — TOC Progression Note (Signed)
Transition of Care [facility]) - Progression Note    Patient Details  Name: Amy Hood MRN: [id number] Date of Birth: May 03, 1941  [facility]) CM/SW Contact  Geni Bers, RN Phone Number: 10/13/2020, 3:44 PM  Clinical Narrative:     Pt needs interpreter, from home with spouse. Plan to discharge home. TOC will continue to follow.   Expected Discharge Plan: Home/Self Care Barriers to Discharge: No Barriers Identified  Expected Discharge Plan and Services Expected Discharge Plan: Home/Self Care       Living arrangements for the past 2 months: Single Family Home                                       Social Determinants of Health (SDOH) Interventions    Readmission Risk Interventions No flowsheet data found.

## 2020-10-13 NOTE — Progress Notes (Signed)
PROGRESS NOTE    Amy Hood  [name] DOB: [birth year] DOA: 10/11/2020 PCP: Georgann Housekeeper, MD   Brief Narrative: The patient is a 79 year old female who is exclusively speaks [place] presented via EMS with complaints of chest pain, shortness of breath, generalized weakness and constipation.  He has a history of hypertension, hyperlipidemia, CAD status post stent placement, paroxysmal atrial fibrillation currently not on anticoagulation, diabetes mellitus as well as other comorbidities who presented to the hospital given her severe generalized weakness and chest pain.  Chest discomfort lasted for 3 days and continued to worsen and patient became more fatigued and generally weak.  Patient was feeling very tired and unable to ambulate and felt it was related to her chest discomfort and shortness of breath but denies any palpitations or cough.  Patient describes her chest pain as "2% now".  Of note she is not on oxygen needed against Covid but Covid testing was negative.   **Interim History SHe underwent a cardiac catheterization today which showed mild coronary calcification and nonobstructive CAD and the recommendation was for medical therapy for mild CAD.  Echo Doppler was also recommended and there is no significant aortic stenosis noted on cath.  Echocardiogram has been done and pending read. Cardiology had the patient on a heparin drip but now discontinued this.  Will need to follow-up with cardiology recommendations and discharge when she is stable from their perspective.  PT recommending no follow-up.  Assessment & Plan:   Principal Problem:   Generalized weakness Active Problems:   Hyperlipidemia   Essential hypertension   CAD (coronary artery disease)   Type 2 diabetes mellitus without complication (HCC)   Elevated troponin   Chest pain  Generalized Weakness -Patient is feeling very weak most likely secondary to poor oral intake and has no appetite for the last few  days.  -Patient normally ambulates with the help of a walker at baseline but for the last 2 to 3 days she is unable to ambulate.  -Given Gentle hydration. Patient is encouraged for oral intake. PT consult ordered for evaluation and treatment. -PT recommending no follow-up -Continue to monitor closely and continue ambulation  Chest Discomfort -Resolved.  -Troponin was elevated and trended down.  -Patient was given a dose of aspirin in the ED.  -Continuous telemetry monitoring.  -Continue home medications. -She had elevated D-dimer so will obtain a CTA of the chest to rule out PE -Chest X-Ray showed "Suggestion of a nodular-like opacity within the left mid lung zone likely due to overlapping anterior and posterior ribs. Recommend PA and lateral view for further evaluation. Otherwise no acute cardiopulmonary abnormality." -See below cardiology recommended cath and this was done today -Echocardiogram has been ordered and still pending to be read; BNP on admission was 491.7 but she does not appear to be volume overloaded on my examination -Follow-up on echocardiogram  Hyperlipidemia -Continue home statin  Essential Hypertension -Blood pressure is stable. Continue home medications  CAD (coronary artery disease) -Chest pain is improved but not resolved. Troponins are flat and went from 117 and is now 114. Continue home medications. -We will consult cardiology for further evaluation recommendations and they are recommending a cardiac catheterization and it is as below  -She was placed on a heparin drip but not off of it -Cardiology recommending continue medical management and clearance by cardiology prior to safe discharge -Echocardiogram was done today and still pending official read  Type 2 diabetes mellitus without complication (HCC) -Hold oral diabetic medications. Sliding scale  insulin ordered.  -Blood glucose monitoring and hypoglycemic protocol in place. -CBGs  ranging from 96-1 26  Normocytic Anemia -The patient's hemoglobin/hematocrit was down to 11.6/35.2 and MCV of 80.4 -Check anemia panel in a.m. -Continue to monitor for signs or symptoms of bleeding; currently no overt bleeding noted  -Repeat CBC in the a.m.  Elevated Troponin -Trended down. Troponins were most likely elevated secondary to demand ischemia in the setting of poor oral intake. -We will consult cardiology for further evaluation recommendations and they recommended cardiac catheterization and cardiac catheterization showed "Mild coronary calcification and nonobstructive CAD with smooth tubular 30% ostial proximal LAD stenosis with a widely patent stent in the LAD; widely patent stent in the mid left circumflex vessel and mild 30% proximal and mid smooth RCA stenoses. No aortic stenosis on LV to AO pullback.  Hyperdynamic LV function with EF estimate at least 65%.  LVEDP 8 mmHg." -Cardiology recommended medical management  Right Upper Lobe Nodule -Found to be 9 mm in the right perihilar region of the right upper lobe -We will consider repeat CT scan of the chest, follow-up PET scan, or tissue sampling -We will run this by the pulmonologist to get their opinion  and they are recommending outpatient follow-up with PCP with pulmonology referral if necessary and warranted by the PCP; Dr. Kendrick Fries does not feel that this needs to be biopsied while she is in-house  Hypomagnesemia  -Patient's magnesium level was 1.5 -Replete with IV Mag Sulfate 2 grams -Continue to Monitor and Replete as Necessary -Repeat magnesium level in a.m.  DVT prophylaxis: Was anticoagulated with a heparin drip but now being switched off by cardiology and transitioning to subcu Lovenox Code Status: FULL CODE  Family Communication: Discussed with daughter at bedside Disposition Plan: Further clinical improvement and clearance by cardiology  Status is: Inpatient  Remains inpatient appropriate because:Unsafe  d/c plan, IV treatments appropriate due to intensity of illness or inability to take PO and Inpatient level of care appropriate due to severity of illness   Dispo: The patient is from: Home              Anticipated d/c is to: Home              Anticipated d/c date is: 1 day              Patient currently is not medically stable to d/c.  Consultants:   Cardiology  Procedures:  ECHOCARDIOGRAM Done and pending read  CARDIAC CATHETERIZATION  Previously placed Prox LAD to Mid LAD stent (unknown type) is widely patent.  Ost LAD to Prox LAD lesion is 30% stenosed.  Prox LAD lesion is 20% stenosed.  Previously placed Prox Cx to Mid Cx stent (unknown type) is widely patent.  Prox RCA lesion is 30% stenosed.  Mid RCA lesion is 30% stenosed.  The left ventricular ejection fraction is greater than 65% by visual estimate.  The left ventricular systolic function is normal.  LV end diastolic pressure is normal.   Mild coronary calcification and nonobstructive CAD with smooth tubular 30% ostial proximal LAD stenosis with a widely patent stent in the LAD; widely patent stent in the mid left circumflex vessel and mild 30% proximal and mid smooth RCA stenoses.  No aortic stenosis on LV to AO pullback.  Hyperdynamic LV function with EF estimate at least 65%.  LVEDP 8 mmHg.  RECOMMENDATION: Medical therapy for mild CAD.  An echo Doppler study had been done just prior to the procedure, results  pending.  There does not appear to be any hemodynamic significant aortic stenosis with no gradient on pullback.   Antimicrobials:  Anti-infectives (From admission, onward)   None        Subjective: Seen and examined after her cardiac catheterization and is resting comfortably.  Denies any chest pain now and states that the sensation that she had is totally gone.  Her daughter helped translate as we did not have a Nurse, learning disabilitytranslator.  Daughter feels that she is improved.  Currently getting normal  saline at 100 MLS per hour for 6 hours after cath given that her cath site was from the groin.  She had no other concerns or complaints at this time and physical therapy recommended no follow-up.  Likely can be discharged tomorrow after echocardiogram is read and cleared by cardiology.  Objective: Vitals:   10/13/20 1155 10/13/20 1240 10/13/20 1255 10/13/20 1455  BP: 117/67 137/64 133/69 118/66  Pulse: 77 70 74 85  Resp: (!) 25 (!) 23 (!) 27 (!) 21  Temp:    98.4 F (36.9 C)  TempSrc:    Oral  SpO2: 94% 93% 94% 97%  Weight:      Height:        Intake/Output Summary (Last 24 hours) at 10/13/2020 1628 Last data filed at 10/13/2020 1547 Gross per 24 hour  Intake 544.45 ml  Output 840 ml  Net -295.55 ml   Filed Weights   10/12/20 1700  Weight: 62.7 kg   Examination: Physical Exam:  Constitutional: WN/WD overweight elderly female currently in NAD and appears calm and comfortable Eyes: Lids and conjunctivae normal, sclerae anicteric  ENMT: External Ears, Nose appear normal. Grossly normal hearing.  Neck: Appears normal, supple, no cervical masses, normal ROM, no appreciable thyromegaly; no JVD Respiratory: Diminished to auscultation bilaterally, no wheezing, rales, rhonchi or crackles. Normal respiratory effort and patient is not tachypenic. No accessory muscle use.  Unlabored breathing Cardiovascular: RRR, no murmurs / rubs / gallops. S1 and S2 auscultated.  Mild extremity edema Abdomen: Soft, non-tender, distended secondary body habitus. Bowel sounds positive.  GU: Deferred. Musculoskeletal: No clubbing / cyanosis of digits/nails. No joint deformity upper and lower extremities. Skin: No rashes, lesions, ulcers on limited skin evaluation cath site not evaluated. No induration; Warm and dry.  Neurologic: CN 2-12 grossly intact with no focal deficits. Romberg sign cerebellar reflexes not assessed.  Psychiatric: Normal judgment and insight. Alert and oriented x 3. Normal mood and  appropriate affect.   Data Reviewed: I have personally reviewed following labs and imaging studies  CBC: Recent Labs  Lab 10/11/20 1848 10/12/20 0426 10/13/20 0432  WBC 12.7* 10.2  10.1 6.6  NEUTROABS 10.5*  --  4.4  HGB 11.6* 11.3*  11.5* 11.6*  HCT 35.2* 34.2*  34.1* 35.2*  MCV 80.7 79.7*  80.0 80.4  PLT 264 296  303 308   Basic Metabolic Panel: Recent Labs  Lab 10/11/20 1848 10/12/20 0426 10/13/20 0432  NA 135 137 135  K 4.4 3.6 3.6  CL 101 101 98  CO2 23 23 22   GLUCOSE 142* 120* 144*  BUN 7* 10 7*  CREATININE 0.54 0.42*  0.52 0.48  CALCIUM 7.8* 8.3* 8.2*  MG  --   --  1.5*  PHOS  --   --  2.9   GFR: Estimated Creatinine Clearance: 42.7 mL/min (by C-G formula based on SCr of 0.48 mg/dL). Liver Function Tests: Recent Labs  Lab 10/13/20 0432  AST 19  ALT 17  ALKPHOS  40  BILITOT 0.8  PROT 6.6  ALBUMIN 3.1*   No results for input(s): LIPASE, AMYLASE in the last 168 hours. No results for input(s): AMMONIA in the last 168 hours. Coagulation Profile: No results for input(s): INR, PROTIME in the last 168 hours. Cardiac Enzymes: No results for input(s): CKTOTAL, CKMB, CKMBINDEX, TROPONINI in the last 168 hours. BNP (last 3 results) No results for input(s): PROBNP in the last 8760 hours. HbA1C: No results for input(s): HGBA1C in the last 72 hours. CBG: Recent Labs  Lab 10/12/20 1230 10/12/20 1622 10/12/20 2125 10/13/20 0801 10/13/20 1109  GLUCAP 96 134* 124* 118* 126*   Lipid Profile: No results for input(s): CHOL, HDL, LDLCALC, TRIG, CHOLHDL, LDLDIRECT in the last 72 hours. Thyroid Function Tests: No results for input(s): TSH, T4TOTAL, FREET4, T3FREE, THYROIDAB in the last 72 hours. Anemia Panel: No results for input(s): VITAMINB12, FOLATE, FERRITIN, TIBC, IRON, RETICCTPCT in the last 72 hours. Sepsis Labs: No results for input(s): PROCALCITON, LATICACIDVEN in the last 168 hours.  Recent Results (from the past 240 hour(s))  Respiratory  Panel by RT PCR (Flu A&B, Covid) - Nasopharyngeal Swab     Status: None   Collection Time: 10/11/20  6:49 PM   Specimen: Nasopharyngeal Swab  Result Value Ref Range Status   SARS Coronavirus 2 by RT PCR NEGATIVE NEGATIVE Final    Comment: (NOTE) SARS-CoV-2 target nucleic acids are NOT DETECTED.  The SARS-CoV-2 RNA is generally detectable in upper respiratoy specimens during the acute phase of infection. The lowest concentration of SARS-CoV-2 viral copies this assay can detect is 131 copies/mL. A negative result does not preclude SARS-Cov-2 infection and should not be used as the sole basis for treatment or other patient management decisions. A negative result may occur with  improper specimen collection/handling, submission of specimen other than nasopharyngeal swab, presence of viral mutation(s) within the areas targeted by this assay, and inadequate number of viral copies (<131 copies/mL). A negative result must be combined with clinical observations, patient history, and epidemiological information. The expected result is Negative.  Fact Sheet for Patients:  https://www.moore.com/  Fact Sheet for Healthcare Providers:  https://www.young.biz/  This test is no t yet approved or cleared by the [place] FDA and  has been authorized for detection and/or diagnosis of SARS-CoV-2 by FDA under an Emergency Use Authorization (EUA). This EUA will remain  in effect (meaning this test can be used) for the duration of the COVID-19 declaration under Section 564(b)(1) of the Act, 21 U.S.C. section 360bbb-3(b)(1), unless the authorization is terminated or revoked sooner.     Influenza A by PCR NEGATIVE NEGATIVE Final   Influenza B by PCR NEGATIVE NEGATIVE Final    Comment: (NOTE) The Xpert Xpress SARS-CoV-2/FLU/RSV assay is intended as an aid in  the diagnosis of influenza from Nasopharyngeal swab specimens and  should not be used as a sole basis  for treatment. Nasal washings and  aspirates are unacceptable for Xpert Xpress SARS-CoV-2/FLU/RSV  testing.  Fact Sheet for Patients: https://www.moore.com/  Fact Sheet for Healthcare Providers: https://www.young.biz/  This test is not yet approved or cleared by the [place] FDA and  has been authorized for detection and/or diagnosis of SARS-CoV-2 by  FDA under an Emergency Use Authorization (EUA). This EUA will remain  in effect (meaning this test can be used) for the duration of the  Covid-19 declaration under Section 564(b)(1) of the Act, 21  U.S.C. section 360bbb-3(b)(1), unless the authorization is  terminated or revoked. Performed at  [facility], [address], [place], [place] [address]      RN Pressure Injury Documentation: Pressure Injury 12/31/16 Stage I -  Intact skin with non-blanchable redness of a localized area usually over a bony prominence. baseball sized area of redness (Active)  12/31/16 1702  Location: Sacrum  Location Orientation: Mid  Staging: Stage I -  Intact skin with non-blanchable redness of a localized area usually over a bony prominence.  Wound Description (Comments): baseball sized area of redness  Present on Admission: Yes     Estimated body mass index is 29.9 kg/m as calculated from the following:   Height as of this encounter: 4' 9.01" (1.448 m).   Weight as of this encounter: 62.7 kg.  Malnutrition Type:   Malnutrition Characteristics:   Nutrition Interventions:   Radiology Studies: CT ANGIO CHEST PE W OR WO CONTRAST  Result Date: 10/12/2020 CLINICAL DATA:  Chest pain, positive D-dimer level. EXAM: CT ANGIOGRAPHY CHEST WITH CONTRAST TECHNIQUE: Multidetector CT imaging of the chest was performed using the standard protocol during bolus administration of intravenous contrast. Multiplanar CT image reconstructions and MIPs were obtained to evaluate the vascular anatomy.  CONTRAST:  OMNIPAQUE IOHEXOL 350 MG/ML SOLN COMPARISON:  None. FINDINGS: Cardiovascular: Satisfactory opacification of the pulmonary arteries to the segmental level. No evidence of pulmonary embolism. Mild cardiomegaly is noted. No pericardial effusion. Extensive coronary artery calcifications are noted. Atherosclerosis of thoracic aorta is noted. Mediastinum/Nodes: No enlarged mediastinal, hilar, or axillary lymph nodes. Thyroid gland, trachea, and esophagus demonstrate no significant findings. Lungs/Pleura: No pneumothorax or pleural effusion is noted. Mild subsegmental atelectasis or infiltrates are noted in the lingula aspect of the left upper lobe. 9 mm nodule is noted in the right perihilar region of the right upper lobe best seen on image number 30 of series 9. Upper Abdomen: Probable small nonobstructive right renal calculus. Musculoskeletal: Status post kyphoplasty at multiple levels of the thoracic and lumbar spine. Several old compression fractures are noted. No definite acute abnormality is noted. Review of the MIP images confirms the above findings. IMPRESSION: 1. No definite evidence of pulmonary embolus. 2. 9 mm nodule is noted in the right perihilar region of the right upper lobe. Consider one of the following in 3 months for both low-risk and high-risk individuals: (a) repeat chest CT, (b) follow-up PET-CT, or (c) tissue sampling. This recommendation follows the consensus statement: Guidelines for Management of Incidental Pulmonary Nodules Detected on CT Images: From the Fleischner Society 2017; Radiology 2017; 284:228-243. These results will be called to the ordering clinician or representative by the Radiologist Assistant, and communication documented in the PACS or zVision Dashboard. 3. Probable small nonobstructive right renal calculus. 4. Extensive coronary artery calcifications are noted suggesting coronary artery disease. 5. Status post kyphoplasty at multiple levels of the thoracic and  lumbar spine. Several old compression fractures are noted. Aortic Atherosclerosis (ICD10-I70.0). Electronically Signed   By: Lupita Raider M.D.   On: 10/12/2020 12:35   CARDIAC CATHETERIZATION  Addendum Date: 10/13/2020    Previously placed Prox LAD to Mid LAD stent (unknown type) is widely patent.  Ost LAD to Prox LAD lesion is 30% stenosed.  Prox LAD lesion is 20% stenosed.  Previously placed Prox Cx to Mid Cx stent (unknown type) is widely patent.  Prox RCA lesion is 30% stenosed.  Mid RCA lesion is 30% stenosed.  The left ventricular ejection fraction is greater than 65% by visual estimate.  The left ventricular systolic function is normal.  LV end diastolic pressure is normal.  Mild coronary calcification and nonobstructive CAD with smooth tubular 30% ostial proximal LAD stenosis with a widely patent stent in the LAD; widely patent stent in the mid left circumflex vessel and mild 30% proximal and mid smooth RCA stenoses. No aortic stenosis on LV to AO pullback. Hyperdynamic LV function with EF estimate at least 65%.  LVEDP 8 mmHg. RECOMMENDATION: Medical therapy for mild CAD.  An echo Doppler study had been done just prior to the procedure, results pending.  There does not appear to be any hemodynamic significant aortic stenosis with no gradient on pullback.   Result Date: 10/13/2020  Previously placed Prox LAD to Mid LAD stent (unknown type) is widely patent.  Ost LAD to Prox LAD lesion is 30% stenosed.  Prox LAD lesion is 20% stenosed.  Previously placed Prox Cx to Mid Cx stent (unknown type) is widely patent.  Prox RCA lesion is 30% stenosed.  Mid RCA lesion is 30% stenosed.  The left ventricular ejection fraction is greater than 65% by visual estimate.  The left ventricular systolic function is normal.  LV end diastolic pressure is normal.  Mild coronary calcification and nonobstructive CAD with smooth tubular 30% ostial proximal LAD stenosis with a widely patent stent in the LAD;  widely patent stent in the mid left circumflex vessel and mild 30% proximal and mid smooth RCA stenoses. No aortic stenosis on LV to AO pullback. Hyperdynamic LV function with EF estimate at least 65%.  LVEDP 8 mmHg. RECOMMENDATION: Medical therapy for mild CAD.  An echo Doppler study had been done just prior to the procedure, results pending.  There does not appear to be any hemodynamic significant aortic stenosis with no gradient on pullback.   DG Chest Port 1 View  Result Date: 10/11/2020 CLINICAL DATA:  Shortness of breath. Weakness and constipation X 3 days. EXAM: PORTABLE CHEST 1 VIEW COMPARISON:  Chest x-ray 11/12/2018 FINDINGS: The heart size and mediastinal contours are within normal limits. Aortic arch calcification. Redemonstration of linear atelectasis versus scarring within the left mid lung zone. Suggestion of a nodule-like opacity within the left mid lung zone likely due to overlapping anterior and posterior ribs. No focal consolidation. No pulmonary edema. No pleural effusion. No pneumothorax. No acute osseous abnormality. Old healed right rib fracture. Interval midthoracic kyphoplasty. IMPRESSION: 1. Suggestion of a nodular-like opacity within the left mid lung zone likely due to overlapping anterior and posterior ribs. Recommend PA and lateral view for further evaluation. 2. Otherwise no acute cardiopulmonary abnormality. Electronically Signed   By: Tish Frederickson M.D.   On: 10/11/2020 19:27   Scheduled Meds: . [START ON 10/14/2020] aspirin  81 mg Oral Daily  . carvedilol  6.25 mg Oral BID WC  . cholecalciferol  1,000 Units Oral Daily  . enoxaparin (LOVENOX) injection  40 mg Subcutaneous Q24H  . influenza vaccine adjuvanted  0.5 mL Intramuscular Tomorrow-1000  . insulin aspart  0-15 Units Subcutaneous TID WC  . insulin aspart  0-5 Units Subcutaneous QHS  . losartan  25 mg Oral Daily  . rosuvastatin  20 mg Oral q1800  . sodium chloride flush  3 mL Intravenous Q12H  . sodium chloride  flush  3 mL Intravenous Q12H   Continuous Infusions: . sodium chloride 100 mL/hr at 10/13/20 1523  . sodium chloride    . magnesium sulfate bolus IVPB      LOS: 1 day   Merlene Laughter, DO [facility] PAGER is on [employer]  If 7PM-7AM, please contact night-coverage [email or web address]

## 2020-10-13 NOTE — H&P (View-Only) (Signed)
Progress Note  Patient Name: Amy Hood Date of Encounter: 10/13/2020  Primary Cardiologist: Dr. Chilton Si, MD   Subjective   No recurrent chest pain. Resting today. Echo being performed.   Inpatient Medications    Scheduled Meds:  [START ON 10/14/2020] aspirin  81 mg Oral Daily   carvedilol  6.25 mg Oral BID WC   cholecalciferol  1,000 Units Oral Daily   enoxaparin (LOVENOX) injection  40 mg Subcutaneous Q24H   influenza vaccine adjuvanted  0.5 mL Intramuscular Tomorrow-1000   insulin aspart  0-15 Units Subcutaneous TID WC   insulin aspart  0-5 Units Subcutaneous QHS   losartan  25 mg Oral Daily   rosuvastatin  20 mg Oral q1800   sodium chloride flush  3 mL Intravenous Q12H   Continuous Infusions:  sodium chloride     sodium chloride 1 mL/kg/hr (10/13/20 0555)   PRN Meds: sodium chloride, acetaminophen **OR** acetaminophen, ondansetron **OR** ondansetron (ZOFRAN) IV, polyethylene glycol, sodium chloride flush   Vital Signs    Vitals:   10/12/20 1557 10/12/20 1700 10/12/20 1957 10/13/20 0459  BP: 118/73  113/65 (!) 168/81  Pulse: 69  64 68  Resp:   17 17  Temp: 98.2 F (36.8 C)  98.5 F (36.9 C) 98.5 F (36.9 C)  TempSrc: Oral  Oral Oral  SpO2:   92% 94%  Weight:  62.7 kg      Intake/Output Summary (Last 24 hours) at 10/13/2020 0741 Last data filed at 10/13/2020 9169 Gross per 24 hour  Intake 831.12 ml  Output 850 ml  Net -18.88 ml   Filed Weights   10/12/20 1700  Weight: 62.7 kg    Physical Exam   General: Elderly, NAD Neck: Negative for carotid bruits. No JVD Lungs:Clear to ausculation bilaterally. No wheezes, rales, or rhonchi. Breathing is unlabored. Cardiovascular: RRR with S1 S2. No murmurs Abdomen: Soft, non-tender, non-distended. No obvious abdominal masses. Extremities: No edema. Radial pulses 2+ bilaterally Neuro: Alert and oriented. No focal deficits. No facial asymmetry. MAE spontaneously. Psych: Responds to questions  appropriately with normal affect.    Labs    Chemistry Recent Labs  Lab 10/11/20 1848 10/12/20 0426 10/13/20 0432  NA 135 137 135  K 4.4 3.6 3.6  CL 101 101 98  CO2 23 23 22   GLUCOSE 142* 120* 144*  BUN 7* 10 7*  CREATININE 0.54 0.42*  0.52 0.48  CALCIUM 7.8* 8.3* 8.2*  PROT  --   --  6.6  ALBUMIN  --   --  3.1*  AST  --   --  19  ALT  --   --  17  ALKPHOS  --   --  40  BILITOT  --   --  0.8  GFRNONAA >60 >60  >60 >60  ANIONGAP 11 13 15      Hematology Recent Labs  Lab 10/11/20 1848 10/12/20 0426 10/13/20 0432  WBC 12.7* 10.2  10.1 6.6  RBC 4.36 4.29  4.26 4.38  HGB 11.6* 11.3*  11.5* 11.6*  HCT 35.2* 34.2*  34.1* 35.2*  MCV 80.7 79.7*  80.0 80.4  MCH 26.6 26.3  27.0 26.5  MCHC 33.0 33.0  33.7 33.0  RDW 13.1 13.1  13.1 13.1  PLT 264 296  303 308    Cardiac EnzymesNo results for input(s): TROPONINI in the last 168 hours. No results for input(s): TROPIPOC in the last 168 hours.   BNP Recent Labs  Lab 10/11/20 1848  BNP 491.7*  DDimer  Recent Labs  Lab 10/11/20 2357  DDIMER 1.50*     Radiology    CT ANGIO CHEST PE W OR WO CONTRAST  Result Date: 10/12/2020 CLINICAL DATA:  Chest pain, positive D-dimer level. EXAM: CT ANGIOGRAPHY CHEST WITH CONTRAST TECHNIQUE: Multidetector CT imaging of the chest was performed using the standard protocol during bolus administration of intravenous contrast. Multiplanar CT image reconstructions and MIPs were obtained to evaluate the vascular anatomy. CONTRAST:  OMNIPAQUE IOHEXOL 350 MG/ML SOLN COMPARISON:  None. FINDINGS: Cardiovascular: Satisfactory opacification of the pulmonary arteries to the segmental level. No evidence of pulmonary embolism. Mild cardiomegaly is noted. No pericardial effusion. Extensive coronary artery calcifications are noted. Atherosclerosis of thoracic aorta is noted. Mediastinum/Nodes: No enlarged mediastinal, hilar, or axillary lymph nodes. Thyroid gland, trachea, and esophagus  demonstrate no significant findings. Lungs/Pleura: No pneumothorax or pleural effusion is noted. Mild subsegmental atelectasis or infiltrates are noted in the lingula aspect of the left upper lobe. 9 mm nodule is noted in the right perihilar region of the right upper lobe best seen on image number 30 of series 9. Upper Abdomen: Probable small nonobstructive right renal calculus. Musculoskeletal: Status post kyphoplasty at multiple levels of the thoracic and lumbar spine. Several old compression fractures are noted. No definite acute abnormality is noted. Review of the MIP images confirms the above findings. IMPRESSION: 1. No definite evidence of pulmonary embolus. 2. 9 mm nodule is noted in the right perihilar region of the right upper lobe. Consider one of the following in 3 months for both low-risk and high-risk individuals: (a) repeat chest CT, (b) follow-up PET-CT, or (c) tissue sampling. This recommendation follows the consensus statement: Guidelines for Management of Incidental Pulmonary Nodules Detected on CT Images: From the Fleischner Society 2017; Radiology 2017; 284:228-243. These results will be called to the ordering clinician or representative by the Radiologist Assistant, and communication documented in the PACS or zVision Dashboard. 3. Probable small nonobstructive right renal calculus. 4. Extensive coronary artery calcifications are noted suggesting coronary artery disease. 5. Status post kyphoplasty at multiple levels of the thoracic and lumbar spine. Several old compression fractures are noted. Aortic Atherosclerosis (ICD10-I70.0). Electronically Signed   By: Lupita Raider M.D.   On: 10/12/2020 12:35   DG Chest Port 1 View  Result Date: 10/11/2020 CLINICAL DATA:  Shortness of breath. Weakness and constipation X 3 days. EXAM: PORTABLE CHEST 1 VIEW COMPARISON:  Chest x-ray 11/12/2018 FINDINGS: The heart size and mediastinal contours are within normal limits. Aortic arch calcification.  Redemonstration of linear atelectasis versus scarring within the left mid lung zone. Suggestion of a nodule-like opacity within the left mid lung zone likely due to overlapping anterior and posterior ribs. No focal consolidation. No pulmonary edema. No pleural effusion. No pneumothorax. No acute osseous abnormality. Old healed right rib fracture. Interval midthoracic kyphoplasty. IMPRESSION: 1. Suggestion of a nodular-like opacity within the left mid lung zone likely due to overlapping anterior and posterior ribs. Recommend PA and lateral view for further evaluation. 2. Otherwise no acute cardiopulmonary abnormality. Electronically Signed   By: Tish Frederickson M.D.   On: 10/11/2020 19:27   Telemetry    10/13/20 NSR with infrequent PVCs/PACs- Personally Reviewed  ECG    No new tracing as of 10/13/20 - Personally Reviewed  Cardiac Studies   Echo 09/02/2018:  - Left ventricle: The cavity size was normal. There was severe    focal basal hypertrophy. Systolic function was normal. The    estimated ejection fraction  was in the range of 60% to 65%. Wall    motion was normal; there were no regional wall motion    abnormalities. Features are consistent with a pseudonormal left    ventricular filling pattern, with concomitant abnormal relaxation    and increased filling pressure (grade 2 diastolic dysfunction).    Doppler parameters are consistent with high ventricular filling    pressure.  - Aortic valve: Probably trileaflet; moderately thickened,    moderately calcified leaflets. There was very mild stenosis. Mean    gradient (S): 11 mm Hg. Valve area (VTI): 1.48 cm^2. Valve area    (Vmax): 1.51 cm^2. Valve area (Vmean): 1.23 cm^2.  - Mitral valve: Valve area by pressure half-time: 2.22 cm^2.  - Left atrium: The atrium was mildly dilated.   LHC 12/31/2016:  Mid RCA lesion, 25 %stenosed. Ost LAD to Prox LAD lesion, 40 %stenosed. LV end diastolic pressure is normal. Mid LAD lesion, 80 %stenosed.  A STENT SYNERGY DES 3X24 drug eluting stent was successfully placed, postdilated to 3.25. Post intervention, there is a 0% residual stenosis. Prox Cx to Mid Cx lesion, 75 %stenosed. A STENT SYNERGY DES 3.5X28 drug eluting stent was successfully placed. Post intervention, there is a 0% residual stenosis.   Continue aggressive secondary prevention. If it is decided that she needs long-term anticoagulation, would change Brilinta to clopidogrel. Would give clopidogrel 300 mg by mouth 1 on the first day, followed by 75 mg daily.   I stressed the importance of her antiplatelet therapy. Synergy drug-eluting stents were placed so that antiplatelet therapy could be stopped early if needed in the setting of long-term anticoagulation for atrial fibrillation.   Continue Aggrastat for 2 hours.   Diagnostic Dominance: Right  Intervention     Patient Profile     79 y.o. female with a hx of CAD s/p NSTEMI 12/2016 with DES to mLAD and proximal LCx, post procedure AF not on AC, HLD, HTN, and DM2.   Assessment & Plan    1. CAD s/p NSTEMI with DES to LAD and LCx: -Pt presented with SSCP and elevated hsT with known CAD hx as above -Echo scheduled>>>currently being performed with pending results -CT with extensive coronary calcifications   -No recurrent chest pain  -Continue Hep infusion, ASA, beta blocker, statin   2. AS: -Per echo in 2019 with mean gradient at -Plan was for repeat echo>>pending   -No high risk symptoms   3. HTN: -Stable, 168/81>>113/65>>118/73 -Continue current regimen   4. HLD: -Last LDL, 52>>>12/31/2016 -Continue statin therapy   5. Pulmonary nodule: -Noted on CTA 10/12/20>>46mm right perihilar right upper lobe nodule  -Needs OP follow up with repeat CT, PET with tissue sampling within 3 months    Signed, Georgie Chard NP-C [facility] Pager: [phone] 10/13/2020, 7:41 AM    Patient examined chart reviewed Discussed care with patient and daughter "Amy Hood"   Exam benign radial pulse palpable. Going for cath at [facility] today given SSCP, with ECG changes and known 2 vessel CAD.  Charlton Haws MD [facility]   For questions or updates, please contact   Please consult [email or web address] for contact info under Cardiology/STEMI.

## 2020-10-13 NOTE — Interval H&P Note (Signed)
Cath Lab Visit (complete for each Cath Lab visit)  Clinical Evaluation Leading to the Procedure:   ACS: No.  Non-ACS:    Anginal Classification: CCS III  Anti-ischemic medical therapy: Minimal Therapy (1 class of medications)  Non-Invasive Test Results: No non-invasive testing performed  Prior CABG: No previous CABG      History and Physical Interval Note:  10/13/2020 10:22 AM  Amy Hood  has presented today for surgery, with the diagnosis of coronary artery disease.  The various methods of treatment have been discussed with the patient and family. After consideration of risks, benefits and other options for treatment, the patient has consented to  Procedure(s): LEFT HEART CATH AND CORONARY ANGIOGRAPHY (N/A) as a surgical intervention.  The patient's history has been reviewed, patient examined, no change in status, stable for surgery.  I have reviewed the patient's chart and labs.  Questions were answered to the patient's satisfaction.     Nicki Guadalajara

## 2020-10-13 NOTE — Progress Notes (Signed)
  Echocardiogram 2D Echocardiogram 3D has been performed.  Amy Hood 10/13/2020, 8:20 AM

## 2020-10-14 DIAGNOSIS — R531 Weakness: Secondary | ICD-10-CM

## 2020-10-14 LAB — CBC WITH DIFFERENTIAL/PLATELET
Abs Immature Granulocytes: 0.03 10*3/uL (ref 0.00–0.07)
Basophils Absolute: 0 10*3/uL (ref 0.0–0.1)
Basophils Relative: 0 %
Eosinophils Absolute: 0.1 10*3/uL (ref 0.0–0.5)
Eosinophils Relative: 1 %
HCT: 33.8 % — ABNORMAL LOW (ref 36.0–46.0)
Hemoglobin: 11.4 g/dL — ABNORMAL LOW (ref 12.0–15.0)
Immature Granulocytes: 0 %
Lymphocytes Relative: 22 %
Lymphs Abs: 1.5 10*3/uL (ref 0.7–4.0)
MCH: 26.5 pg (ref 26.0–34.0)
MCHC: 33.7 g/dL (ref 30.0–36.0)
MCV: 78.4 fL — ABNORMAL LOW (ref 80.0–100.0)
Monocytes Absolute: 0.6 10*3/uL (ref 0.1–1.0)
Monocytes Relative: 8 %
Neutro Abs: 4.7 10*3/uL (ref 1.7–7.7)
Neutrophils Relative %: 69 %
Platelets: 341 10*3/uL (ref 150–400)
RBC: 4.31 MIL/uL (ref 3.87–5.11)
RDW: 13 % (ref 11.5–15.5)
WBC: 6.9 10*3/uL (ref 4.0–10.5)
nRBC: 0 % (ref 0.0–0.2)

## 2020-10-14 LAB — COMPREHENSIVE METABOLIC PANEL
ALT: 15 U/L (ref 0–44)
AST: 17 U/L (ref 15–41)
Albumin: 3.2 g/dL — ABNORMAL LOW (ref 3.5–5.0)
Alkaline Phosphatase: 38 U/L (ref 38–126)
Anion gap: 11 (ref 5–15)
BUN: 6 mg/dL — ABNORMAL LOW (ref 8–23)
CO2: 24 mmol/L (ref 22–32)
Calcium: 8 mg/dL — ABNORMAL LOW (ref 8.9–10.3)
Chloride: 102 mmol/L (ref 98–111)
Creatinine, Ser: 0.45 mg/dL (ref 0.44–1.00)
GFR, Estimated: >60 mL/min (ref >60)
Glucose, Bld: 130 mg/dL — ABNORMAL HIGH (ref 70–99)
Potassium: 3.1 mmol/L — ABNORMAL LOW (ref 3.5–5.1)
Sodium: 137 mmol/L (ref 135–145)
Total Bilirubin: 0.8 mg/dL (ref 0.3–1.2)
Total Protein: 6.8 g/dL (ref 6.5–8.1)

## 2020-10-14 LAB — MAGNESIUM: Magnesium: 1.8 mg/dL (ref 1.7–2.4)

## 2020-10-14 LAB — PHOSPHORUS: Phosphorus: 2.9 mg/dL (ref 2.5–4.6)

## 2020-10-14 LAB — GLUCOSE, CAPILLARY
Glucose-Capillary: 178 mg/dL — ABNORMAL HIGH (ref 70–99)
Glucose-Capillary: 89 mg/dL (ref 70–99)

## 2020-10-14 MED ORDER — MAGNESIUM SULFATE 2 GM/50ML IV SOLN
2.0000 g | Freq: Once | INTRAVENOUS | Status: AC
  Administered 2020-10-14: 2 g via INTRAVENOUS
  Filled 2020-10-14: qty 50

## 2020-10-14 MED ORDER — POTASSIUM CHLORIDE CRYS ER 20 MEQ PO TBCR
40.0000 meq | EXTENDED_RELEASE_TABLET | Freq: Two times a day (BID) | ORAL | Status: DC
  Administered 2020-10-14: 40 meq via ORAL
  Filled 2020-10-14: qty 2

## 2020-10-14 MED ORDER — GUAIFENESIN 100 MG/5ML PO SOLN: 5.0000 mL | ORAL | 0 refills | Status: AC | PRN

## 2020-10-14 MED ORDER — ONDANSETRON HCL 4 MG PO TABS: 4.0000 mg | ORAL_TABLET | Freq: Four times a day (QID) | ORAL | 0 refills | Status: DC | PRN

## 2020-10-14 NOTE — Progress Notes (Signed)
IV's removed from bilateral arms, right and left AC's are WDL. Purewick and tele removed. Discharge paperwork reviewed with patients daughter Amy Hood, who verbalized understanding and denies any questions or concerns. Patient did receive Flu vaccine prior to this discharge. Attending MD and Cardiology has sighned off. Family denies any other needs in the home caring for the patient. Discharged to home via private vehicle with her daughter accompanying her.

## 2020-10-14 NOTE — Discharge Summary (Signed)
Physician Discharge Summary  Amy Hood ZOX:[id number] DOB: 08-26-1941 DOA: 10/11/2020  PCP: Georgann Housekeeper, MD  Admit date: 10/11/2020 Discharge date: 10/14/2020  Admitted From: Home Disposition: Home  Recommendations for Outpatient Follow-up:  1. Follow up with PCP in 1-2 weeks 2. Follow up with Cardiology within 1-2 weeks 3. Follow up with RUL Nodule as an outpatient  4. Please obtain CMP/CBC, Mag, Phos in one week 5. Please follow up on the following pending results:  Home Health: No Equipment/Devices: None    Discharge Condition: Stable CODE STATUS: FULL CODE Diet recommendation: Heart Health Diet   Brief/Interim Summary: The patient is a 79 year old female who is exclusively speaks Montagn[place] Rhadepresented via EMS with complaints of chest pain, shortness of breath, generalized weakness and constipation. He has a history of hypertension, hyperlipidemia, CAD status post stent placement, paroxysmal atrial fibrillation currently not on anticoagulation, diabetes mellitus as well as other comorbidities who presented to the hospital given her severe generalized weakness and chest pain. Chest discomfort lasted for 3 days and continued to worsen and patient became more fatigued and generally weak. Patient was feeling very tired and unable to ambulate and felt it was related to her chest discomfort and shortness of breath but denies any palpitations or cough.Patient describes her chest pain as "2% now". Of note she is not on oxygen needed against Covid but Covid testing was negative.   **Interim History SHe underwent a cardiac catheterization today which showed mild coronary calcification and nonobstructive CAD and the recommendation was for medical therapy for mild CAD.  Echo Doppler was also recommended and there is no significant aortic stenosis noted on cath.  Echocardiogram has been done and pending read. Cardiology had the patient on a heparin drip but now discontinued this.   Will need to follow-up with cardiology recommendations and discharge when she is stable from their perspective.  PT recommending no follow-up.  Discharge Diagnoses:  Principal Problem:   Generalized weakness Active Problems:   Hyperlipidemia   Essential hypertension   CAD (coronary artery disease)   Type 2 diabetes mellitus without complication (HCC)   Elevated troponin   Chest pain  GeneralizedWeakness, improved  -Patient is feeling very weak most likely secondary to poor oral intake and has no appetite for the last few days. -Patient normally ambulates with the help of a walker at baseline but for the last 2 to 3 days she is unable to ambulate. -GivenGentle hydration. Patient is encouraged for oral intake. PT consult ordered for evaluation and treatment. -PT recommending no follow-up -Continue to monitor closely and continue ambulation  ChestDiscomfort, improved  -Resolved. -Troponin was elevated and trended down. -Patient was given a dose of aspirin in the ED. -Continuous telemetry monitoring. -Continue home medications. -She had elevated D-dimer so will obtain a CTA of the chest to rule out PE -Chest X-Ray showed "Suggestion of a nodular-like opacity within the left mid lung zone likely due to overlapping anterior and posterior ribs. Recommend PA and lateral view for further evaluation. Otherwise no acute cardiopulmonary abnormality." -See below cardiology recommended cath and this was done today -Echocardiogram has been ordered and still pending to be read; BNP on admission was 491.7 but she does not appear to be volume overloaded on my examination -Follow-up on echocardiogram and as below showed EF of 60-65% with Grade 1 Diastolic Dysfunction -Cardiology cleared patient for Discharge   Hyperlipidemia -Continue home statin  EssentialHypertension -Blood pressure is stable. Continue home medications  CAD (coronary artery disease) -Chest pain  isimproved  but not resolved. Troponins are flatand went from 117 and is now 114. Continue home medications. -We will consult cardiology for further evaluation recommendations and they are recommending a cardiac catheterization and it is as below  -She was placed on a heparin drip but not off of it -Cardiology recommending continue medical management and clearance by cardiology prior to safe discharge -C/w Cardiac Meds per Dr. Wyline Mood Recommendations  -Echocardiogram was done today and still pending official read  Type 2 diabetes mellitus without complication (HCC) -Hold oral diabetic medications. Sliding scale insulin ordered. -Blood glucose monitoring and hypoglycemic protocol in place. -CBGs ranging from 89-178  NormocyticAnemia -> Microcytic Anemia  -The patient's hemoglobin/hematocrit was down to 11.4/33.8 and MCV of 78.4 -Check anemia panel in a.m. as an outpatient  -Continue to monitor for signs or symptoms of bleeding;currently no overt bleeding noted -Repeat CBC within 1 week   ElevatedTroponin -Trended down. Troponins were most likely elevated secondary to demand ischemia in the setting of poor oral intake. -We will consult cardiology for further evaluation recommendations and they recommended cardiac catheterization and cardiac catheterization showed "Mild coronary calcification and nonobstructive CAD with smooth tubular 30% ostial proximal LAD stenosis with a widely patent stent in the LAD; widely patent stent in the mid left circumflex vessel and mild 30% proximal and mid smooth RCA stenoses. No aortic stenosis on LV to AO pullback.  Hyperdynamic LV function with EF estimate at least 65%. LVEDP 8 mmHg." -Cardiology recommended medical management  RightUpperLobeNodule -Found to be 9 mm in the right perihilar region of the right upper lobe -We will consider repeat CT scan of the chest, follow-up PET scan, or tissue sampling -We will run this by the  pulmonologistto get their opinion and they are recommending outpatient follow-up with PCP with pulmonology referral if necessary and warranted by the PCP; Dr. Kendrick Fries does not feel that this needs to be biopsied while she is in-house  Hypomagnesemia  -Patient's magnesium level was 1.8 prior to D/C -Replete with IV Mag Sulfate 2 grams yesterday -Continue to Monitor and Replete as Necessary -Repeat magnesium level within 1 week   Hypokalemia -Mild at 3.1 -Replete prior to D/C -Continue to Monitor and Replete as Necessary -Repeat CMP within 1 week   Discharge Instructions  Discharge Instructions    (HEART FAILURE PATIENTS) Call MD:  Anytime you have any of the following symptoms: 1) 3 pound weight gain in 24 hours or 5 pounds in 1 week 2) shortness of breath, with or without a dry hacking cough 3) swelling in the hands, feet or stomach 4) if you have to sleep on extra pillows at night in order to breathe.   Complete by: As directed    Call MD for:  difficulty breathing, headache or visual disturbances   Complete by: As directed    Call MD for:  extreme fatigue   Complete by: As directed    Call MD for:  hives   Complete by: As directed    Call MD for:  persistant dizziness or light-headedness   Complete by: As directed    Call MD for:  persistant nausea and vomiting   Complete by: As directed    Call MD for:  redness, tenderness, or signs of infection (pain, swelling, redness, odor or green/yellow discharge around incision site)   Complete by: As directed    Call MD for:  severe uncontrolled pain   Complete by: As directed    Call MD for:  temperature >100.4  Complete by: As directed    Diet - low sodium heart healthy   Complete by: As directed    Diet Carb Modified   Complete by: As directed    Discharge instructions   Complete by: As directed    You were cared for by a hospitalist during your hospital stay. If you have any questions about your discharge medications or the  care you received while you were in the hospital after you are discharged, you can call the unit and ask to speak with the hospitalist on call if the hospitalist that took care of you is not available. Once you are discharged, your primary care physician will handle any further medical issues. Please note that NO REFILLS for any discharge medications will be authorized once you are discharged, as it is imperative that you return to your primary care physician (or establish a relationship with a primary care physician if you do not have one) for your aftercare needs so that they can reassess your need for medications and monitor your lab values.  Follow up with PCP and Cardiology in the outpatient setting. Take all medications as prescribed. If symptoms change or worsen please return to the ED for evaluation   Increase activity slowly   Complete by: As directed      Allergies as of 10/14/2020   No Known Allergies     Medication List    STOP taking these medications   acetaminophen 500 MG tablet Commonly known as: TYLENOL   lidocaine 5 % Commonly known as: LIDODERM     TAKE these medications   aspirin 81 MG chewable tablet Chew 81 mg by mouth daily.   carvedilol 6.25 MG tablet Commonly known as: COREG Take 1 tablet (6.25 mg total) by mouth 2 (two) times daily with a meal.   cholecalciferol 25 MCG (1000 UNIT) tablet Commonly known as: VITAMIN D3 Take 1,000 Units by mouth daily.   guaiFENesin 100 MG/5ML Soln Commonly known as: ROBITUSSIN Take 5 mLs (100 mg total) by mouth every 4 (four) hours as needed for cough or to loosen phlegm.   iron polysaccharides 150 MG capsule Commonly known as: NIFEREX Take 1 capsule (150 mg total) by mouth daily.   losartan 25 MG tablet Commonly known as: COZAAR TAKE 1 TABLET BY MOUTH EVERY DAY   metFORMIN 500 MG tablet Commonly known as: GLUCOPHAGE Take 500 mg by mouth daily with breakfast. with food   ondansetron 4 MG tablet Commonly known  as: ZOFRAN Take 1 tablet (4 mg total) by mouth every 6 (six) hours as needed for nausea.   oxyCODONE 5 MG immediate release tablet Commonly known as: Oxy IR/ROXICODONE Take 1 tablet (5 mg total) by mouth every 4 (four) hours as needed for severe pain. Use 3 times daily for the next 2 days, and then 1 tablet every 4 hours as needed for pain.   polyethylene glycol 17 g packet Commonly known as: MIRALAX / GLYCOLAX Take 17 g by mouth 2 (two) times daily. What changed:   when to take this  reasons to take this   rosuvastatin 20 MG tablet Commonly known as: CRESTOR Take 1 tablet (20 mg total) by mouth daily at 6 PM.   senna-docusate 8.6-50 MG tablet Commonly known as: Senokot-S Take 1 tablet by mouth at bedtime.       Follow-up Information    Georgann HousekeeperHusain, Karrar, MD. Call.   Specialty: Internal Medicine Why: Follow up within 1 week Contact information: [address],  [address] [place] [address] [phone]        Pricilla Riffle, MD Follow up.   Specialty: Cardiology Why: Follow up within 1 week Contact information: [address] [place] [address] [phone]              No Known Allergies  Consultations:  Cardiology  Procedures/Studies: CT ANGIO CHEST PE W OR WO CONTRAST  Result Date: 10/12/2020 CLINICAL DATA:  Chest pain, positive D-dimer level. EXAM: CT ANGIOGRAPHY CHEST WITH CONTRAST TECHNIQUE: Multidetector CT imaging of the chest was performed using the standard protocol during bolus administration of intravenous contrast. Multiplanar CT image reconstructions and MIPs were obtained to evaluate the vascular anatomy. CONTRAST:  OMNIPAQUE IOHEXOL 350 MG/ML SOLN COMPARISON:  None. FINDINGS: Cardiovascular: Satisfactory opacification of the pulmonary arteries to the segmental level. No evidence of pulmonary embolism. Mild cardiomegaly is noted. No pericardial effusion. Extensive coronary artery calcifications are noted.  Atherosclerosis of thoracic aorta is noted. Mediastinum/Nodes: No enlarged mediastinal, hilar, or axillary lymph nodes. Thyroid gland, trachea, and esophagus demonstrate no significant findings. Lungs/Pleura: No pneumothorax or pleural effusion is noted. Mild subsegmental atelectasis or infiltrates are noted in the lingula aspect of the left upper lobe. 9 mm nodule is noted in the right perihilar region of the right upper lobe best seen on image number 30 of series 9. Upper Abdomen: Probable small nonobstructive right renal calculus. Musculoskeletal: Status post kyphoplasty at multiple levels of the thoracic and lumbar spine. Several old compression fractures are noted. No definite acute abnormality is noted. Review of the MIP images confirms the above findings. IMPRESSION: 1. No definite evidence of pulmonary embolus. 2. 9 mm nodule is noted in the right perihilar region of the right upper lobe. Consider one of the following in 3 months for both low-risk and high-risk individuals: (a) repeat chest CT, (b) follow-up PET-CT, or (c) tissue sampling. This recommendation follows the consensus statement: Guidelines for Management of Incidental Pulmonary Nodules Detected on CT Images: From the Fleischner Society 2017; Radiology 2017; 284:228-243. These results will be called to the ordering clinician or representative by the Radiologist Assistant, and communication documented in the PACS or zVision Dashboard. 3. Probable small nonobstructive right renal calculus. 4. Extensive coronary artery calcifications are noted suggesting coronary artery disease. 5. Status post kyphoplasty at multiple levels of the thoracic and lumbar spine. Several old compression fractures are noted. Aortic Atherosclerosis (ICD10-I70.0). Electronically Signed   By: Lupita Raider M.D.   On: 10/12/2020 12:35   CARDIAC CATHETERIZATION  Addendum Date: 10/13/2020    Previously placed Prox LAD to Mid LAD stent (unknown type) is widely patent.   Ost LAD to Prox LAD lesion is 30% stenosed.  Prox LAD lesion is 20% stenosed.  Previously placed Prox Cx to Mid Cx stent (unknown type) is widely patent.  Prox RCA lesion is 30% stenosed.  Mid RCA lesion is 30% stenosed.  The left ventricular ejection fraction is greater than 65% by visual estimate.  The left ventricular systolic function is normal.  LV end diastolic pressure is normal.  Mild coronary calcification and nonobstructive CAD with smooth tubular 30% ostial proximal LAD stenosis with a widely patent stent in the LAD; widely patent stent in the mid left circumflex vessel and mild 30% proximal and mid smooth RCA stenoses. No aortic stenosis on LV to AO pullback. Hyperdynamic LV function with EF estimate at least 65%.  LVEDP 8 mmHg. RECOMMENDATION: Medical therapy for mild CAD.  An echo Doppler  study had been done just prior to the procedure, results pending.  There does not appear to be any hemodynamic significant aortic stenosis with no gradient on pullback.   Result Date: 10/13/2020  Previously placed Prox LAD to Mid LAD stent (unknown type) is widely patent.  Ost LAD to Prox LAD lesion is 30% stenosed.  Prox LAD lesion is 20% stenosed.  Previously placed Prox Cx to Mid Cx stent (unknown type) is widely patent.  Prox RCA lesion is 30% stenosed.  Mid RCA lesion is 30% stenosed.  The left ventricular ejection fraction is greater than 65% by visual estimate.  The left ventricular systolic function is normal.  LV end diastolic pressure is normal.  Mild coronary calcification and nonobstructive CAD with smooth tubular 30% ostial proximal LAD stenosis with a widely patent stent in the LAD; widely patent stent in the mid left circumflex vessel and mild 30% proximal and mid smooth RCA stenoses. No aortic stenosis on LV to AO pullback. Hyperdynamic LV function with EF estimate at least 65%.  LVEDP 8 mmHg. RECOMMENDATION: Medical therapy for mild CAD.  An echo Doppler study had been done just  prior to the procedure, results pending.  There does not appear to be any hemodynamic significant aortic stenosis with no gradient on pullback.   DG Chest Port 1 View  Result Date: 10/11/2020 CLINICAL DATA:  Shortness of breath. Weakness and constipation X 3 days. EXAM: PORTABLE CHEST 1 VIEW COMPARISON:  Chest x-ray 11/12/2018 FINDINGS: The heart size and mediastinal contours are within normal limits. Aortic arch calcification. Redemonstration of linear atelectasis versus scarring within the left mid lung zone. Suggestion of a nodule-like opacity within the left mid lung zone likely due to overlapping anterior and posterior ribs. No focal consolidation. No pulmonary edema. No pleural effusion. No pneumothorax. No acute osseous abnormality. Old healed right rib fracture. Interval midthoracic kyphoplasty. IMPRESSION: 1. Suggestion of a nodular-like opacity within the left mid lung zone likely due to overlapping anterior and posterior ribs. Recommend PA and lateral view for further evaluation. 2. Otherwise no acute cardiopulmonary abnormality. Electronically Signed   By: Tish Frederickson M.D.   On: 10/11/2020 19:27   ECHOCARDIOGRAM COMPLETE  Result Date: 10/13/2020    ECHOCARDIOGRAM REPORT   Patient Name:   Amy Hood Date of Exam: 10/13/2020 Medical Rec #:  [id number]   Height:       57.0 in Accession #:    [id number]  Weight:       138.2 lb Date of Birth:  Feb 03, 1941  BSA:          1.537 m Patient Age:    78 years    BP:           168/81 mmHg Patient Gender: F           HR:           68 bpm. Exam Location:  Inpatient Procedure: 2D Echo and 3D Echo Indications:    Aortic Stenosis 424.1 / 135.0  History:        Patient has prior history of Echocardiogram examinations, most                 recent 09/06/2018. CAD, Arrythmias:PVC; Risk Factors:Hypertension                 and Dyslipidemia. Generalized weakness is also related with                 chest discomfort and shortness of breath. History  of paroxysmal                  atrial fibrillation. Elevated troponin.  Sonographer:    Leta Jungling RDCS Referring Phys: [id number] IMPRESSIONS  1. Global longitudincal strain is -13.2%. Left ventricular ejection fraction, by estimation, is 60 to 65%. The left ventricle has normal function. The left ventricle has no regional wall motion abnormalities. Left ventricular diastolic parameters are consistent with Grade I diastolic dysfunction (impaired relaxation). Elevated left atrial pressure.  2. Right ventricular systolic function is normal. The right ventricular size is normal. There is normal pulmonary artery systolic pressure.  3. The mitral valve is abnormal. Trivial mitral valve regurgitation.  4. AV is thickened, calcified with minimally restricted motion . Peak and mean gradients through the valea are 12 and 6 mm Hg respectively This is lower than what was reported previously in 2019. The aortic valve is tricuspid. Aortic valve regurgitation  is not visualized. FINDINGS  Left Ventricle: Global longitudincal strain is -13.2%. Left ventricular ejection fraction, by estimation, is 60 to 65%. The left ventricle has normal function. The left ventricle has no regional wall motion abnormalities. The left ventricular internal cavity size was normal in size. There is no left ventricular hypertrophy. Left ventricular diastolic parameters are consistent with Grade I diastolic dysfunction (impaired relaxation). Elevated left atrial pressure. Right Ventricle: The right ventricular size is normal. Right vetricular wall thickness was not assessed. Right ventricular systolic function is normal. There is normal pulmonary artery systolic pressure. The tricuspid regurgitant velocity is 2.69 m/s, and with an assumed right atrial pressure of 3 mmHg, the estimated right ventricular systolic pressure is 31.9 mmHg. Left Atrium: Left atrial size was normal in size. Right Atrium: Right atrial size was normal in size. Pericardium: There is no  evidence of pericardial effusion. Mitral Valve: The mitral valve is abnormal. There is mild thickening of the mitral valve leaflet(s). Mild mitral annular calcification. Trivial mitral valve regurgitation. Tricuspid Valve: The tricuspid valve is normal in structure. Tricuspid valve regurgitation is mild. Aortic Valve: AV is thickened, calcified with minimally restricted motion . Peak and mean gradients through the valea are 12 and 6 mm Hg respectively This is lower than what was reported previously in 2019. The aortic valve is tricuspid. Aortic valve regurgitation is not visualized. Aortic valve mean gradient measures 6.0 mmHg. Aortic valve peak gradient measures 12.5 mmHg. Aortic valve area, by VTI measures 1.41 cm. Pulmonic Valve: The pulmonic valve was normal in structure. Pulmonic valve regurgitation is not visualized. Aorta: The aortic root and ascending aorta are structurally normal, with no evidence of dilitation. IAS/Shunts: The interatrial septum was not well visualized.  LEFT VENTRICLE PLAX 2D LVIDd:         3.70 cm  Diastology LVIDs:         2.30 cm  LV e' medial:    2.51 cm/s LV PW:         1.10 cm  LV E/e' medial:  28.9 LV IVS:        1.10 cm  LV e' lateral:   3.81 cm/s LVOT diam:     1.70 cm  LV E/e' lateral: 19.0 LV SV:         47 LV SV Index:   31 LVOT Area:     2.27 cm                          3D Volume EF:  3D EF:        66 %                         LV EDV:       92 ml                         LV ESV:       31 ml                         LV SV:        61 ml RIGHT VENTRICLE RV S prime:     10.30 cm/s TAPSE (M-mode): 1.4 cm LEFT ATRIUM             Index       RIGHT ATRIUM           Index LA diam:        2.20 cm 1.43 cm/m  RA Area:     11.10 cm LA Vol (A2C):   43.9 ml 28.56 ml/m RA Volume:   21.10 ml  13.73 ml/m LA Vol (A4C):   29.0 ml 18.86 ml/m LA Biplane Vol: 35.6 ml 23.16 ml/m  AORTIC VALVE AV Area (Vmax):    1.09 cm AV Area (Vmean):   1.10 cm AV Area (VTI):     1.41  cm AV Vmax:           177.00 cm/s AV Vmean:          112.000 cm/s AV VTI:            0.335 m AV Peak Grad:      12.5 mmHg AV Mean Grad:      6.0 mmHg LVOT Vmax:         85.00 cm/s LVOT Vmean:        54.400 cm/s LVOT VTI:          0.208 m LVOT/AV VTI ratio: 0.62  AORTA Ao Asc diam: 3.55 cm MITRAL VALVE                TRICUSPID VALVE MV Area (PHT): 3.50 cm     TR Peak grad:   28.9 mmHg MV Decel Time: 217 msec     TR Vmax:        269.00 cm/s MV E velocity: 72.50 cm/s MV A velocity: 112.50 cm/s  SHUNTS MV E/A ratio:  0.64         Systemic VTI:  0.21 m                             Systemic Diam: 1.70 cm Dietrich Pates MD Electronically signed by Dietrich Pates MD Signature Date/Time: 10/13/2020/4:54:43 PM    Final      Subjective: Examined at bedside and her chest pain resolved.  She did have a slight cough with some clear sputum but this is stable.  She denies any nausea or vomiting.  Ready to be discharged home as she is improved.  Discharge Exam: Vitals:   10/14/20 0432 10/14/20 0800  BP: (!) 159/77   Pulse: 76   Resp: 16   Temp: 98.8 F (37.1 C)   SpO2: 91% 92%   Vitals:   10/13/20 1455 10/13/20 2057 10/14/20 0432 10/14/20 0800  BP: 118/66 121/60 (!) 159/77   Pulse: 85 68 76   Resp: (!) 21  20 16   Temp: 98.4 F (36.9 C) 98.3 F (36.8 C) 98.8 F (37.1 C)   TempSrc: Oral Oral Oral   SpO2: 97% 91% 91% 92%  Weight:      Height:       General: Pt is alert, awake, not in acute distress Cardiovascular: RRR, S1/S2 +, no rubs, no gallops Respiratory: Slightly diminished bilaterally, no wheezing, no rhonchi Abdominal: Soft, NT, ND, bowel sounds + Extremities: Minimal edema, no cyanosis  The results of significant diagnostics from this hospitalization (including imaging, microbiology, ancillary and laboratory) are listed below for reference.    Microbiology: Recent Results (from the past 240 hour(s))  Respiratory Panel by RT PCR (Flu A&B, Covid) - Nasopharyngeal Swab     Status: None    Collection Time: 10/11/20  6:49 PM   Specimen: Nasopharyngeal Swab  Result Value Ref Range Status   SARS Coronavirus 2 by RT PCR NEGATIVE NEGATIVE Final    Comment: (NOTE) SARS-CoV-2 target nucleic acids are NOT DETECTED.  The SARS-CoV-2 RNA is generally detectable in upper respiratoy specimens during the acute phase of infection. The lowest concentration of SARS-CoV-2 viral copies this assay can detect is 131 copies/mL. A negative result does not preclude SARS-Cov-2 infection and should not be used as the sole basis for treatment or other patient management decisions. A negative result may occur with  improper specimen collection/handling, submission of specimen other than nasopharyngeal swab, presence of viral mutation(s) within the areas targeted by this assay, and inadequate number of viral copies (<131 copies/mL). A negative result must be combined with clinical observations, patient history, and epidemiological information. The expected result is Negative.  Fact Sheet for Patients:  https://www.moore.com/  Fact Sheet for Healthcare Providers:  https://www.young.biz/  This test is no t yet approved or cleared by the [place] FDA and  has been authorized for detection and/or diagnosis of SARS-CoV-2 by FDA under an Emergency Use Authorization (EUA). This EUA will remain  in effect (meaning this test can be used) for the duration of the COVID-19 declaration under Section 564(b)(1) of the Act, 21 U.S.C. section 360bbb-3(b)(1), unless the authorization is terminated or revoked sooner.     Influenza A by PCR NEGATIVE NEGATIVE Final   Influenza B by PCR NEGATIVE NEGATIVE Final    Comment: (NOTE) The Xpert Xpress SARS-CoV-2/FLU/RSV assay is intended as an aid in  the diagnosis of influenza from Nasopharyngeal swab specimens and  should not be used as a sole basis for treatment. Nasal washings and  aspirates are unacceptable for Xpert  Xpress SARS-CoV-2/FLU/RSV  testing.  Fact Sheet for Patients: https://www.moore.com/  Fact Sheet for Healthcare Providers: https://www.young.biz/  This test is not yet approved or cleared by the [place] FDA and  has been authorized for detection and/or diagnosis of SARS-CoV-2 by  FDA under an Emergency Use Authorization (EUA). This EUA will remain  in effect (meaning this test can be used) for the duration of the  Covid-19 declaration under Section 564(b)(1) of the Act, 21  U.S.C. section 360bbb-3(b)(1), unless the authorization is  terminated or revoked. Performed at [facility], [address] [place], [place] [address]     Labs: BNP (last 3 results) Recent Labs    10/11/20 1848  BNP 491.7*   Basic Metabolic Panel: Recent Labs  Lab 10/11/20 1848 10/12/20 0426 10/13/20 0432 10/14/20 0552  NA 135 137 135 137  K 4.4 3.6 3.6 3.1*  CL 101 101 98 102  CO2 23 23 22  24  GLUCOSE 142* 120* 144* 130*  BUN 7* 10 7* 6*  CREATININE 0.54 0.42*  0.52 0.48 0.45  CALCIUM 7.8* 8.3* 8.2* 8.0*  MG  --   --  1.5* 1.8  PHOS  --   --  2.9 2.9   Liver Function Tests: Recent Labs  Lab 10/13/20 0432 10/14/20 0552  AST 19 17  ALT 17 15  ALKPHOS 40 38  BILITOT 0.8 0.8  PROT 6.6 6.8  ALBUMIN 3.1* 3.2*   No results for input(s): LIPASE, AMYLASE in the last 168 hours. No results for input(s): AMMONIA in the last 168 hours. CBC: Recent Labs  Lab 10/11/20 1848 10/12/20 0426 10/13/20 0432 10/14/20 0552  WBC 12.7* 10.2  10.1 6.6 6.9  NEUTROABS 10.5*  --  4.4 4.7  HGB 11.6* 11.3*  11.5* 11.6* 11.4*  HCT 35.2* 34.2*  34.1* 35.2* 33.8*  MCV 80.7 79.7*  80.0 80.4 78.4*  PLT 264 296  303 308 341   Cardiac Enzymes: No results for input(s): CKTOTAL, CKMB, CKMBINDEX, TROPONINI in the last 168 hours. BNP: Invalid input(s): POCBNP CBG: Recent Labs  Lab 10/13/20 1109 10/13/20 1653 10/13/20 2059 10/14/20 0738  10/14/20 1136  GLUCAP 126* 130* 129* 178* 89   D-Dimer Recent Labs    10/11/20 2357  DDIMER 1.50*   Hgb A1c No results for input(s): HGBA1C in the last 72 hours. Lipid Profile No results for input(s): CHOL, HDL, LDLCALC, TRIG, CHOLHDL, LDLDIRECT in the last 72 hours. Thyroid function studies No results for input(s): TSH, T4TOTAL, T3FREE, THYROIDAB in the last 72 hours.  Invalid input(s): FREET3 Anemia work up No results for input(s): VITAMINB12, FOLATE, FERRITIN, TIBC, IRON, RETICCTPCT in the last 72 hours. Urinalysis    Component Value Date/Time   COLORURINE YELLOW 10/13/2020 0601   APPEARANCEUR HAZY (A) 10/13/2020 0601   LABSPEC 1.018 10/13/2020 0601   PHURINE 7.0 10/13/2020 0601   GLUCOSEU NEGATIVE 10/13/2020 0601   HGBUR NEGATIVE 10/13/2020 0601   BILIRUBINUR NEGATIVE 10/13/2020 0601   KETONESUR 20 (A) 10/13/2020 0601   PROTEINUR NEGATIVE 10/13/2020 0601   UROBILINOGEN 0.2 09/20/2013 0822   NITRITE POSITIVE (A) 10/13/2020 0601   LEUKOCYTESUR TRACE (A) 10/13/2020 0601   Sepsis Labs Invalid input(s): PROCALCITONIN,  WBC,  LACTICIDVEN Microbiology Recent Results (from the past 240 hour(s))  Respiratory Panel by RT PCR (Flu A&B, Covid) - Nasopharyngeal Swab     Status: None   Collection Time: 10/11/20  6:49 PM   Specimen: Nasopharyngeal Swab  Result Value Ref Range Status   SARS Coronavirus 2 by RT PCR NEGATIVE NEGATIVE Final    Comment: (NOTE) SARS-CoV-2 target nucleic acids are NOT DETECTED.  The SARS-CoV-2 RNA is generally detectable in upper respiratoy specimens during the acute phase of infection. The lowest concentration of SARS-CoV-2 viral copies this assay can detect is 131 copies/mL. A negative result does not preclude SARS-Cov-2 infection and should not be used as the sole basis for treatment or other patient management decisions. A negative result may occur with  improper specimen collection/handling, submission of specimen other than nasopharyngeal  swab, presence of viral mutation(s) within the areas targeted by this assay, and inadequate number of viral copies (<131 copies/mL). A negative result must be combined with clinical observations, patient history, and epidemiological information. The expected result is Negative.  Fact Sheet for Patients:  https://www.moore.com/  Fact Sheet for Healthcare Providers:  https://www.young.biz/  This test is no t yet approved or cleared by the [place] FDA and  has  been authorized for detection and/or diagnosis of SARS-CoV-2 by FDA under an Emergency Use Authorization (EUA). This EUA will remain  in effect (meaning this test can be used) for the duration of the COVID-19 declaration under Section 564(b)(1) of the Act, 21 U.S.C. section 360bbb-3(b)(1), unless the authorization is terminated or revoked sooner.     Influenza A by PCR NEGATIVE NEGATIVE Final   Influenza B by PCR NEGATIVE NEGATIVE Final    Comment: (NOTE) The Xpert Xpress SARS-CoV-2/FLU/RSV assay is intended as an aid in  the diagnosis of influenza from Nasopharyngeal swab specimens and  should not be used as a sole basis for treatment. Nasal washings and  aspirates are unacceptable for Xpert Xpress SARS-CoV-2/FLU/RSV  testing.  Fact Sheet for Patients: https://www.moore.com/  Fact Sheet for Healthcare Providers: https://www.young.biz/  This test is not yet approved or cleared by the [place] FDA and  has been authorized for detection and/or diagnosis of SARS-CoV-2 by  FDA under an Emergency Use Authorization (EUA). This EUA will remain  in effect (meaning this test can be used) for the duration of the  Covid-19 declaration under Section 564(b)(1) of the Act, 21  U.S.C. section 360bbb-3(b)(1), unless the authorization is  terminated or revoked. Performed at [facility], [address] [place], [place] [address]     Time coordinating discharge: 35 minutes  SIGNED:  Merlene Laughter, DO [facility] 10/14/2020, 7:47 PM Pager is on AMION  If 7PM-7AM, please contact night-coverage [email or web address]

## 2020-10-14 NOTE — Progress Notes (Signed)
    Dr Fabio Bering rounding note reviewed, cath and echo from yesterday reviewed.    1. CAD/Chest pain - prior history of NSTEMI Jan 2018 received DES to LAD and prox LCX Admitted with chest pain and generalized weakenss, peak trop 117 this admit,  cath this admit showed nonobstructive disease, LVEDP 8. - echo 60-65%, no WMAs, grade I diastolic dysfunction, normal RV function -CT PE was also negative  - continue medical therapy for her chronic CAD: ASA 81, coreg 6.25mg  bid, losartan 25, crestor 20    - no evidence of acute cardiac process this admission. Coronaries without new obstructive disease, normal filling pressures. No further cardiac workup is planned, we will signoff inpatient care.    For questions or updates, please contact [facility] Please consult [email or web address] for contact info under        Signed, Dina Rich, MD  10/14/2020, 8:39 AM

## 2020-10-16 ENCOUNTER — Encounter (HOSPITAL_COMMUNITY): Payer: Self-pay | Admitting: Cardiovascular Disease

## 2020-10-19 ENCOUNTER — Other Ambulatory Visit: Payer: Self-pay | Admitting: Internal Medicine

## 2020-10-19 DIAGNOSIS — R911 Solitary pulmonary nodule: Secondary | ICD-10-CM

## 2020-11-25 ENCOUNTER — Emergency Department (HOSPITAL_COMMUNITY): Payer: Medicare Other

## 2020-11-25 ENCOUNTER — Inpatient Hospital Stay (HOSPITAL_COMMUNITY)
Admission: EM | Admit: 2020-11-25 | Discharge: 2020-11-27 | DRG: 690 | Disposition: A | Discharge status: Home Health | Payer: Medicare Other | Attending: Internal Medicine | Admitting: Internal Medicine

## 2020-11-25 ENCOUNTER — Encounter (HOSPITAL_COMMUNITY): Payer: Self-pay

## 2020-11-25 DIAGNOSIS — G8929 Other chronic pain: Secondary | ICD-10-CM | POA: Diagnosis present

## 2020-11-25 DIAGNOSIS — I480 Paroxysmal atrial fibrillation: Secondary | ICD-10-CM | POA: Diagnosis present

## 2020-11-25 DIAGNOSIS — N390 Urinary tract infection, site not specified: Secondary | ICD-10-CM | POA: Diagnosis not present

## 2020-11-25 DIAGNOSIS — I10 Essential (primary) hypertension: Secondary | ICD-10-CM | POA: Diagnosis present

## 2020-11-25 DIAGNOSIS — Z833 Family history of diabetes mellitus: Secondary | ICD-10-CM

## 2020-11-25 DIAGNOSIS — I4581 Long QT syndrome: Secondary | ICD-10-CM | POA: Diagnosis present

## 2020-11-25 DIAGNOSIS — Z7982 Long term (current) use of aspirin: Secondary | ICD-10-CM

## 2020-11-25 DIAGNOSIS — Z79899 Other long term (current) drug therapy: Secondary | ICD-10-CM

## 2020-11-25 DIAGNOSIS — I2510 Atherosclerotic heart disease of native coronary artery without angina pectoris: Secondary | ICD-10-CM | POA: Diagnosis present

## 2020-11-25 DIAGNOSIS — R531 Weakness: Secondary | ICD-10-CM | POA: Diagnosis present

## 2020-11-25 DIAGNOSIS — R9431 Abnormal electrocardiogram [ECG] [EKG]: Secondary | ICD-10-CM | POA: Diagnosis not present

## 2020-11-25 DIAGNOSIS — E876 Hypokalemia: Secondary | ICD-10-CM | POA: Diagnosis present

## 2020-11-25 DIAGNOSIS — Z7984 Long term (current) use of oral hypoglycemic drugs: Secondary | ICD-10-CM

## 2020-11-25 DIAGNOSIS — E119 Type 2 diabetes mellitus without complications: Secondary | ICD-10-CM

## 2020-11-25 DIAGNOSIS — E785 Hyperlipidemia, unspecified: Secondary | ICD-10-CM | POA: Diagnosis present

## 2020-11-25 DIAGNOSIS — K5900 Constipation, unspecified: Secondary | ICD-10-CM | POA: Diagnosis present

## 2020-11-25 DIAGNOSIS — Z20822 Contact with and (suspected) exposure to covid-19: Secondary | ICD-10-CM | POA: Diagnosis present

## 2020-11-25 DIAGNOSIS — I252 Old myocardial infarction: Secondary | ICD-10-CM

## 2020-11-25 DIAGNOSIS — N200 Calculus of kidney: Secondary | ICD-10-CM | POA: Diagnosis present

## 2020-11-25 DIAGNOSIS — B9620 Unspecified Escherichia coli [E. coli] as the cause of diseases classified elsewhere: Secondary | ICD-10-CM | POA: Diagnosis present

## 2020-11-25 DIAGNOSIS — Z79891 Long term (current) use of opiate analgesic: Secondary | ICD-10-CM

## 2020-11-25 DIAGNOSIS — Z7983 Long term (current) use of bisphosphonates: Secondary | ICD-10-CM

## 2020-11-25 DIAGNOSIS — Z955 Presence of coronary angioplasty implant and graft: Secondary | ICD-10-CM

## 2020-11-25 LAB — CBC
HCT: 36.4 % (ref 36.0–46.0)
Hemoglobin: 12.4 g/dL (ref 12.0–15.0)
MCH: 25.7 pg — ABNORMAL LOW (ref 26.0–34.0)
MCHC: 34.1 g/dL (ref 30.0–36.0)
MCV: 75.5 fL — ABNORMAL LOW (ref 80.0–100.0)
Platelets: 248 10*3/uL (ref 150–400)
RBC: 4.82 MIL/uL (ref 3.87–5.11)
RDW: 13.2 % (ref 11.5–15.5)
WBC: 8.8 10*3/uL (ref 4.0–10.5)
nRBC: 0 % (ref 0.0–0.2)

## 2020-11-25 LAB — COMPREHENSIVE METABOLIC PANEL
ALT: 17 U/L (ref 0–44)
AST: 23 U/L (ref 15–41)
Albumin: 3.3 g/dL — ABNORMAL LOW (ref 3.5–5.0)
Alkaline Phosphatase: 41 U/L (ref 38–126)
Anion gap: 14 (ref 5–15)
BUN: 19 mg/dL (ref 8–23)
CO2: 22 mmol/L (ref 22–32)
Calcium: 8.4 mg/dL — ABNORMAL LOW (ref 8.9–10.3)
Chloride: 93 mmol/L — ABNORMAL LOW (ref 98–111)
Creatinine, Ser: 0.67 mg/dL (ref 0.44–1.00)
GFR, Estimated: >60 mL/min (ref >60)
Glucose, Bld: 166 mg/dL — ABNORMAL HIGH (ref 70–99)
Potassium: 3 mmol/L — ABNORMAL LOW (ref 3.5–5.1)
Sodium: 129 mmol/L — ABNORMAL LOW (ref 135–145)
Total Bilirubin: 0.6 mg/dL (ref 0.3–1.2)
Total Protein: 6.6 g/dL (ref 6.5–8.1)

## 2020-11-25 LAB — HEMOGLOBIN A1C
Hgb A1c MFr Bld: 7.2 % — ABNORMAL HIGH (ref 4.8–5.6)
Mean Plasma Glucose: 159.94 mg/dL

## 2020-11-25 LAB — URINALYSIS, ROUTINE W REFLEX MICROSCOPIC
Bilirubin Urine: NEGATIVE
Glucose, UA: NEGATIVE mg/dL
Hgb urine dipstick: NEGATIVE
Ketones, ur: NEGATIVE mg/dL
Nitrite: POSITIVE — AB
Protein, ur: NEGATIVE mg/dL
Specific Gravity, Urine: 1.011 (ref 1.005–1.030)
pH: 7 (ref 5.0–8.0)

## 2020-11-25 LAB — RESP PANEL BY RT-PCR (FLU A&B, COVID) ARPGX2
Influenza A by PCR: NEGATIVE
Influenza B by PCR: NEGATIVE
SARS Coronavirus 2 by RT PCR: NEGATIVE

## 2020-11-25 LAB — CBC WITH DIFFERENTIAL/PLATELET
Abs Immature Granulocytes: 0.02 10*3/uL (ref 0.00–0.07)
Basophils Absolute: 0 10*3/uL (ref 0.0–0.1)
Basophils Relative: 0 %
Eosinophils Absolute: 0 10*3/uL (ref 0.0–0.5)
Eosinophils Relative: 0 %
HCT: 33.6 % — ABNORMAL LOW (ref 36.0–46.0)
Hemoglobin: 11.5 g/dL — ABNORMAL LOW (ref 12.0–15.0)
Immature Granulocytes: 0 %
Lymphocytes Relative: 23 %
Lymphs Abs: 1.8 10*3/uL (ref 0.7–4.0)
MCH: 26.2 pg (ref 26.0–34.0)
MCHC: 34.2 g/dL (ref 30.0–36.0)
MCV: 76.5 fL — ABNORMAL LOW (ref 80.0–100.0)
Monocytes Absolute: 0.8 10*3/uL (ref 0.1–1.0)
Monocytes Relative: 10 %
Neutro Abs: 5.1 10*3/uL (ref 1.7–7.7)
Neutrophils Relative %: 67 %
Platelets: 230 10*3/uL (ref 150–400)
RBC: 4.39 MIL/uL (ref 3.87–5.11)
RDW: 13.2 % (ref 11.5–15.5)
WBC: 7.7 10*3/uL (ref 4.0–10.5)
nRBC: 0 % (ref 0.0–0.2)

## 2020-11-25 LAB — MAGNESIUM: Magnesium: 1.6 mg/dL — ABNORMAL LOW (ref 1.7–2.4)

## 2020-11-25 LAB — LACTIC ACID, PLASMA
Lactic Acid, Venous: 3.3 mmol/L (ref 0.5–1.9)
Lactic Acid, Venous: 2.5 mmol/L (ref 0.5–1.9)

## 2020-11-25 LAB — CREATININE, SERUM
Creatinine, Ser: 0.56 mg/dL (ref 0.44–1.00)
GFR, Estimated: >60 mL/min (ref >60)

## 2020-11-25 LAB — GLUCOSE, CAPILLARY: Glucose-Capillary: 153 mg/dL — ABNORMAL HIGH (ref 70–99)

## 2020-11-25 LAB — LIPASE, BLOOD: Lipase: 30 U/L (ref 11–51)

## 2020-11-25 MED ORDER — BOOST / RESOURCE BREEZE PO LIQD CUSTOM
1.0000 | Freq: Three times a day (TID) | ORAL | Status: DC
  Administered 2020-11-26 – 2020-11-27 (×4): 1 via ORAL

## 2020-11-25 MED ORDER — POTASSIUM CHLORIDE 10 MEQ/100ML IV SOLN
10.0000 meq | INTRAVENOUS | Status: AC
  Administered 2020-11-25: 18:00:00 10 meq via INTRAVENOUS
  Filled 2020-11-25: qty 100

## 2020-11-25 MED ORDER — SODIUM CHLORIDE 0.9 % IV SOLN
1.0000 g | Freq: Once | INTRAVENOUS | Status: AC
  Administered 2020-11-25: 18:00:00 1 g via INTRAVENOUS
  Filled 2020-11-25: qty 10

## 2020-11-25 MED ORDER — ACETAMINOPHEN 325 MG PO TABS: 650.0000 mg | ORAL_TABLET | Freq: Four times a day (QID) | ORAL | Status: DC | PRN

## 2020-11-25 MED ORDER — INSULIN ASPART 100 UNIT/ML ~~LOC~~ SOLN
0.0000 [IU] | Freq: Every day | SUBCUTANEOUS | Status: DC
  Filled 2020-11-25: qty 0.05

## 2020-11-25 MED ORDER — SODIUM CHLORIDE 0.9 % IV BOLUS
1000.0000 mL | Freq: Once | INTRAVENOUS | Status: AC
  Administered 2020-11-25: 21:00:00 1000 mL via INTRAVENOUS

## 2020-11-25 MED ORDER — SODIUM CHLORIDE 0.9 % IV BOLUS
500.0000 mL | Freq: Once | INTRAVENOUS | Status: AC
  Administered 2020-11-25: 18:00:00 500 mL via INTRAVENOUS

## 2020-11-25 MED ORDER — SODIUM CHLORIDE 0.9 % IV SOLN
1.0000 g | INTRAVENOUS | Status: DC
  Administered 2020-11-26: 18:00:00 1 g via INTRAVENOUS
  Filled 2020-11-25: qty 10
  Filled 2020-11-25: qty 1

## 2020-11-25 MED ORDER — MAGNESIUM SULFATE 50 % IJ SOLN: 1.0000 g | Freq: Once | INTRAMUSCULAR | Status: DC

## 2020-11-25 MED ORDER — ACETAMINOPHEN 650 MG RE SUPP: 650.0000 mg | Freq: Four times a day (QID) | RECTAL | Status: DC | PRN

## 2020-11-25 MED ORDER — IOHEXOL 300 MG/ML  SOLN
100.0000 mL | Freq: Once | INTRAMUSCULAR | Status: AC | PRN
  Administered 2020-11-25: 17:00:00 100 mL via INTRAVENOUS

## 2020-11-25 MED ORDER — INSULIN ASPART 100 UNIT/ML ~~LOC~~ SOLN
0.0000 [IU] | Freq: Three times a day (TID) | SUBCUTANEOUS | Status: DC
  Administered 2020-11-26: 08:00:00 2 [IU] via SUBCUTANEOUS
  Administered 2020-11-26 (×2): 3 [IU] via SUBCUTANEOUS
  Administered 2020-11-27: 09:00:00 2 [IU] via SUBCUTANEOUS
  Administered 2020-11-27: 12:00:00 8 [IU] via SUBCUTANEOUS
  Filled 2020-11-25: qty 0.15

## 2020-11-25 MED ORDER — ENOXAPARIN SODIUM 40 MG/0.4ML ~~LOC~~ SOLN
40.0000 mg | SUBCUTANEOUS | Status: DC
  Administered 2020-11-25 – 2020-11-26 (×2): 40 mg via SUBCUTANEOUS
  Filled 2020-11-25 (×2): qty 0.4

## 2020-11-25 MED ORDER — SODIUM CHLORIDE 0.9 % IV SOLN: INTRAVENOUS | Status: DC

## 2020-11-25 MED ORDER — MAGNESIUM SULFATE IN D5W 1-5 GM/100ML-% IV SOLN
1.0000 g | Freq: Once | INTRAVENOUS | Status: AC
  Administered 2020-11-25: 19:00:00 1 g via INTRAVENOUS
  Filled 2020-11-25: qty 100

## 2020-11-25 NOTE — ED Provider Notes (Addendum)
[facility] DEPT Provider Note   CSN: [id number] Arrival date & time: 11/25/20  1326     History Chief Complaint  Patient presents with  . Abdominal Pain    Amy Hood is a 79 y.o. female with PMH of CAD and NSTEMI 12/2016, multivessel disease, paroxysmal atrial fibrillation not anticoagulated, HTN, HLD, and type II DM who presents the ED via EMS with a 1 week history of abdominal pain, nausea, vomiting, and constipation.  Patient takes oxycodone 5 mg IR 3 times daily for chronic pain.  I reviewed patient's medical record and she was recently admitted to hospital 10/11/2020 for generalized weakness in context of elevated troponin levels.  Cardiac catheterization obtained demonstrated nonobstructive CAD.  Echocardiogram demonstrates grade 1 diastolic dysfunction. Noted to ambulate with assistance of walker, PT did not recommend any specific follow-up.    Patient is accompanied by her daughter who is at bedside and is providing much of the history.  Her daughter states that for the past week she has been particularly weak.  She also is experiencing 6 out of 10 generalized abdominal pain, predominantly in lower regions.  She has been having bowel movements with relative regularity, however this morning it was smaller and "harder" than usual.  No melena or hematochezia.  Daughter states that this morning she was unable to tolerate even a spoonful of her food before vomiting her food contents.  No previous episodes of emesis.  Daughter states that patient can typically ambulate with a walker, but this week she has been profoundly weak and fatigued.  No hematemesis.  No fevers or chills.  Daughter also denies any reports of chest pain, shortness of breath, urinary symptoms, or other symptoms.  Patient lives at home with daughter and extended family.  She states that she has never had a colonoscopy performed.  No melena or BRBPR.  Patient has not been immunized for  COVID-19.    HPI     Past Medical History:  Diagnosis Date  . Anemia   . Arthritis   . CAD (coronary artery disease) 12/28/2016   S/p NSTEMI 1/18:  LHC - oLAD, mLAD 80, pLCx 75, mRCA 25 >> PCI:  3 x 24 mm Synergy DES to mid LAD; 3.5 x 28 mm Synergy DES to proximal LCx // Echo 1/18:  Mild LVH, EF 55-60, apical inf and apical HK, Gr 1 DD, AV mean 13, MAC, trivial MR, reduced RVSF, trivial TR, PASP 35, no eff  . Cataracts, bilateral   . Essential hypertension   . Generalized weakness   . Hard of hearing   . History of atrial fibrillation    post MI - likely related to acute medical illness therefore anticoag not started  . Hyperlipidemia   . Hyponatremia   . Mild aortic stenosis 12/30/2016   Echo 1/18:  Mild LVH, EF 55-60, apical inf and apical HK, Gr 1 DD, AV mean 13, MAC, trivial MR, reduced RVSF, trivial TR, PASP 35, no eff // Echo 09/02/2018:  Severe focal basal septal hypertrophy, EF 60-65, no RWMA, Gr 2 DD, very mild AS (mean 11), mild LAE   . Type 2 diabetes mellitus [facility])     Patient Active Problem List   Diagnosis Date Noted  . UTI (urinary tract infection) 11/25/2020  . Elevated troponin 10/12/2020  . Chest pain 10/12/2020  . Compression fracture of T7 vertebra (HCC)   . Dehydration   . Compression fracture of T6 vertebra ([place]) 11/18/2018  . Lactic acid acidosis   .  CAD S/P percutaneous coronary angioplasty   . Hyperglycemia 11/12/2018  . Type 2 diabetes mellitus without complication ([place]) 65/02/5464  . Pressure injury of skin 12/31/2016  . Mild aortic stenosis 12/30/2016  . NSTEMI (non-ST elevated myocardial infarction) ([place]) 12/28/2016  . CAD (coronary artery disease) 12/28/2016  . Abnormal ECG 12/28/2016  . Generalized weakness 12/28/2016  . Non-English speaking patient 12/28/2016  . Thrombocytopenia ([place]) 12/28/2016  . PAF (paroxysmal atrial fibrillation) ([place]) 12/28/2016  . Hard of hearing 12/28/2016  . Essential hypertension   . Arthritis   . Compression  fracture of L5 lumbar vertebra ([place]) 09/20/2013  . Acute lumbar radiculopathy 09/20/2013  . Acute encephalopathy 09/19/2013  . Suicidal behavior 09/19/2013  . Hyperlipidemia   . Cataracts, bilateral     Past Surgical History:  Procedure Laterality Date  . CARDIAC CATHETERIZATION N/A 12/31/2016   Procedure: Left Heart Cath and Coronary Angiography;  Surgeon: Jettie Booze, MD;  Location: [facility];  Service: Cardiovascular;  Laterality: N/A;  . CARDIAC CATHETERIZATION N/A 12/31/2016   Procedure: Coronary Stent Intervention;  Surgeon: Jettie Booze, MD;  Location: [facility];  Service: Cardiovascular;  Laterality: N/A;  . IR KYPHO THORACIC WITH BONE BIOPSY  11/20/2018  . KYPHOPLASTY    . LEFT HEART CATH AND CORONARY ANGIOGRAPHY N/A 10/13/2020   Procedure: LEFT HEART CATH AND CORONARY ANGIOGRAPHY;  Surgeon: Troy Sine, MD;  Location: [facility];  Service: Cardiovascular;  Laterality: N/A;     OB History   No obstetric history on file.     Family History  Problem Relation Age of Onset  . Diabetes Mother     Social History   Tobacco Use  . Smoking status: Never Smoker  . Smokeless tobacco: Never Used  Vaping Use  . Vaping Use: Never used  Substance Use Topics  . Alcohol use: No  . Drug use: No    Home Medications Prior to Admission medications   Medication Sig Start Date End Date Taking? Authorizing Provider  alendronate (FOSAMAX) 70 MG tablet Take 70 mg by mouth once a week. 11/05/20   [provider]  aspirin 81 MG chewable tablet Chew 81 mg by mouth daily.    [provider]  carvedilol (COREG) 6.25 MG tablet Take 1 tablet (6.25 mg total) by mouth 2 (two) times daily with a meal. 01/02/17   Oswald Hillock, MD  cholecalciferol (VITAMIN D3) 25 MCG (1000 UT) tablet Take 1,000 Units by mouth daily.    [provider]  guaiFENesin (ROBITUSSIN) 100 MG/5ML SOLN Take 5 mLs (100 mg total) by mouth every 4 (four)  hours as needed for cough or to loosen phlegm. 10/14/20   Raiford Noble Latif, DO  HYDROcodone-acetaminophen (NORCO) 7.5-325 MG tablet Take 1 tablet by mouth 3 (three) times daily as needed for pain. 10/20/20   [provider]  iron polysaccharides (NIFEREX) 150 MG capsule Take 1 capsule (150 mg total) by mouth daily. 09/21/13   Wenda Low, MD  losartan (COZAAR) 25 MG tablet TAKE 1 TABLET BY MOUTH EVERY DAY Patient taking differently: Take 25 mg by mouth daily.  08/31/19   Richardson Dopp T, PA-C  losartan (COZAAR) 50 MG tablet Take 50 mg by mouth daily. 10/25/20   [provider]  metFORMIN (GLUCOPHAGE) 500 MG tablet Take 500 mg by mouth daily with breakfast. with food 07/31/18   [provider]  ondansetron (ZOFRAN) 4 MG tablet Take 1 tablet (4 mg total) by mouth  every 6 (six) hours as needed for nausea. 10/14/20   Raiford Noble Latif, DO  oxyCODONE (OXY IR/ROXICODONE) 5 MG immediate release tablet Take 1 tablet (5 mg total) by mouth every 4 (four) hours as needed for severe pain. Use 3 times daily for the next 2 days, and then 1 tablet every 4 hours as needed for pain. 11/23/18   Eugenie Filler, MD  polyethylene glycol [facility] Floria Raveling) packet Take 17 g by mouth 2 (two) times daily. Patient taking differently: Take 17 g by mouth daily as needed for mild constipation.  11/23/18   Eugenie Filler, MD  rosuvastatin (CRESTOR) 20 MG tablet Take 1 tablet (20 mg total) by mouth daily at 6 PM. 01/02/17   Oswald Hillock, MD  senna-docusate (SENOKOT-S) 8.6-50 MG tablet Take 1 tablet by mouth at bedtime. 11/23/18   Eugenie Filler, MD    Allergies    Patient has no known allergies.  Review of Systems   Review of Systems  All other systems reviewed and are negative.   Physical Exam Updated Vital Signs BP 126/79   Pulse 72   Temp 99.8 F (37.7 C) (Rectal)   Resp 20   SpO2 99%   Physical Exam Vitals and nursing note reviewed. Exam conducted with a chaperone  present.  Constitutional:      General: She is not in acute distress.    Appearance: She is ill-appearing.  HENT:     Head: Normocephalic and atraumatic.  Eyes:     General: No scleral icterus.    Conjunctiva/sclera: Conjunctivae normal.  Cardiovascular:     Rate and Rhythm: Normal rate.     Pulses: Normal pulses.  Pulmonary:     Effort: Pulmonary effort is normal. No respiratory distress.  Abdominal:     General: Abdomen is flat. There is no distension.     Palpations: Abdomen is soft. There is no mass.     Tenderness: There is abdominal tenderness. There is no guarding.     Comments: Moderate TTP diffusely.  No guarding.  Soft, nondistended.    Skin:    General: Skin is dry.     Capillary Refill: Capillary refill takes less than 2 seconds.  Neurological:     Mental Status: She is alert.     GCS: GCS eye subscore is 4. GCS verbal subscore is 5. GCS motor subscore is 6.  Psychiatric:        Mood and Affect: Mood normal.        Behavior: Behavior normal.        Thought Content: Thought content normal.     ED Results / Procedures / Treatments   Labs (all labs ordered are listed, but only abnormal results are displayed) Labs Reviewed  CBC WITH DIFFERENTIAL/PLATELET - Abnormal; Notable for the following components:      Result Value   Hemoglobin 11.5 (*)    HCT 33.6 (*)    MCV 76.5 (*)    All other components within normal limits  COMPREHENSIVE METABOLIC PANEL - Abnormal; Notable for the following components:   Sodium 129 (*)    Potassium 3.0 (*)    Chloride 93 (*)    Glucose, Bld 166 (*)    Calcium 8.4 (*)    Albumin 3.3 (*)    All other components within normal limits  URINALYSIS, ROUTINE W REFLEX MICROSCOPIC - Abnormal; Notable for the following components:   APPearance HAZY (*)    Nitrite POSITIVE (*)  Leukocytes,Ua SMALL (*)    Bacteria, UA MANY (*)    All other components within normal limits  URINE CULTURE  CULTURE, BLOOD (ROUTINE X 2)  CULTURE, BLOOD  (ROUTINE X 2)  RESP PANEL BY RT-PCR (FLU A&B, COVID) ARPGX2  LIPASE, BLOOD  MAGNESIUM  LACTIC ACID, PLASMA  LACTIC ACID, PLASMA    EKG EKG Interpretation  Date/Time:  Saturday November 25 2020 15:21:20 EST Ventricular Rate:  74 PR Interval:    QRS Duration: 84 QT Interval:  471 QTC Calculation: 523 R Axis:   22 Text Interpretation: Ectopic atrial rhythm Nonspecific T abnormalities, diffuse leads Prolonged QT interval Since last tracing QT has lengthened Otherwise no significant change Confirmed by Daleen Bo [phone]) on 11/25/2020 4:04:01 PM   Radiology CT ABDOMEN PELVIS W CONTRAST  Result Date: 11/25/2020 CLINICAL DATA:  Lower abdominal pain, early satiety, vomiting EXAM: CT ABDOMEN AND PELVIS WITH CONTRAST TECHNIQUE: Multidetector CT imaging of the abdomen and pelvis was performed using the standard protocol following bolus administration of intravenous contrast. CONTRAST:  144m OMNIPAQUE IOHEXOL 300 MG/ML  SOLN COMPARISON:  10/12/2020, 11/03/2018 FINDINGS: Lower chest: No acute pleural or parenchymal lung disease. Hepatobiliary: No focal liver abnormality is seen. No gallstones, gallbladder wall thickening, or biliary dilatation. Pancreas: Unremarkable. No pancreatic ductal dilatation or surrounding inflammatory changes. Spleen: Normal in size without focal abnormality. Adrenals/Urinary Tract: There is scattered bilateral areas of renal cortical scarring. Small cortical cysts are noted. 2 mm nonobstructing calculus right kidney image 22. No obstructive uropathy. The adrenals and bladder are unremarkable. Stomach/Bowel: No bowel obstruction or ileus. Normal appendix right lower quadrant. No bowel wall thickening or inflammatory change. Vascular/Lymphatic: Aortic atherosclerosis. No enlarged abdominal or pelvic lymph nodes. Reproductive: Uterus and bilateral adnexa are unremarkable. Other: No free fluid or free gas. No abdominal wall hernia. Musculoskeletal: Stable multilevel  compression deformities of the lumbar spine with previous vertebral augmentation at L1, L2, and L5. No acute bony abnormalities. Reconstructed images demonstrate no additional findings. IMPRESSION: 1. No acute intra-abdominal or intrapelvic process. 2. A 2 mm nonobstructing right renal calculus. 3. Aortic Atherosclerosis (ICD10-I70.0). Electronically Signed   By: MRanda NgoM.D.   On: 11/25/2020 17:27   DG Chest Portable 1 View  Result Date: 11/25/2020 CLINICAL DATA:  Syncope, vomiting and constipation. EXAM: PORTABLE CHEST 1 VIEW COMPARISON:  Single-view of the chest 10/11/2020. CT chest 10/12/2020. FINDINGS: Lungs clear. Heart size normal. Aortic atherosclerosis. Postoperative change of vertebral augmentation in the midthoracic spine noted. Remote bilateral rib fractures again seen. IMPRESSION: No acute disease. Aortic Atherosclerosis (ICD10-I70.0). Electronically Signed   By: TInge RiseM.D.   On: 11/25/2020 17:41    Procedures Procedures (including critical care time)  Medications Ordered in ED Medications  sodium chloride 0.9 % bolus 500 mL (has no administration in time range)  potassium chloride 10 mEq in 100 mL IVPB (has no administration in time range)  magnesium sulfate IVPB 1 g 100 mL (has no administration in time range)  cefTRIAXone (ROCEPHIN) 1 g in sodium chloride 0.9 % 100 mL IVPB (has no administration in time range)  iohexol (OMNIPAQUE) 300 MG/ML solution 100 mL (100 mLs Intravenous Contrast Given 11/25/20 1652)    ED Course  I have reviewed the triage vital signs and the nursing notes.  Pertinent labs & imaging results that were available during my care of the patient were reviewed by me and considered in my medical decision making (see chart for details).  Clinical Course as of 11/25/20 1746  Sat  Nov 25, 2020  1744 Spoke with Dr. Wyline Copas who will admit patient for weakness and difficulty tolerating PO in context of complicated UTI. [GG]    Clinical Course User  Index [GG] Corena Herter, PA-C   MDM Rules/Calculators/A&P                          Labs CBC with differential: Microcytic anemia appears consistent with baseline.  No leukocytosis, vacuolated neutrophils are noted.   CMP: Worsening hyponatremia to 129 from 137 and hypokalemia to 3.0.  Renal function is preserved.   Lipase: Within normal limits. UA: Nitrite positive with small leukocytes and many bacteria.   Urine culture: Pending.    EKG is personally reviewed and demonstrates prolonged QTc of 523, but otherwise no significant changes when compared to prior tracings.  Rectal temperature of 99.8 F.  Mild tachypnea, otherwise vital signs are stable.  Patient had a low magnesium during last hospital admission.  Will add a magnesium lab check here in the ED and then replenish potassium +/- magnesium.  Daughter tells me that usually she can ambulate with walker quite well, but this week she has not been able to ambulate without returning to her bed and going back to sleep.  Will also add on lactic acid and blood cultures in context of severe weakness and failure to thrive.    Imaging CT abdomen and pelvis with contrast is personally reviewed and without any obvious acute intra-abdominal or intrapelvic pathology. No obstruction or ileus.  No bowel wall thickening.   UA is nitrite positive with many bacteria and small leukocytes, suggestive of urinary tract infection.  Given patient's weakness and difficulty tolerating food and liquid by mouth in context of prolonged QTc, I feel as though admission to hospitalist for complicated UTI is prudent.  Will start her on Rocephin.    Spoke with Dr. Wyline Copas who will admit patient for weakness and difficulty tolerating PO in context of complicated UTI.   Final Clinical Impression(s) / ED Diagnoses Final diagnoses:  Complicated UTI (urinary tract infection)    Rx / DC Orders ED Discharge Orders    None       Corena Herter, PA-C 11/25/20  1745    Corena Herter, PA-C 11/25/20 1746    Lacretia Leigh, MD 11/26/20 1309

## 2020-11-25 NOTE — ED Notes (Signed)
Attempted to call report 2x. Nurse not available at this time, stated they will call me back.

## 2020-11-25 NOTE — ED Notes (Signed)
Rectal temp was 99.8 

## 2020-11-25 NOTE — Progress Notes (Signed)
CRITICAL VALUE ALERT  Critical Value:  Lactic acid 2.5  Date & Time Notied:  12/18 @ 2202  Provider Notified: Bruna Potter  Orders Received/Actions taken: Awaiting new orders

## 2020-11-25 NOTE — ED Notes (Signed)
Placed pt on purewick  

## 2020-11-25 NOTE — ED Triage Notes (Signed)
Bib ems from home. Pt speaks [place]. abd pain, constipation and N/V x 1 week. Takes pain meds and stool softeners per ems.

## 2020-11-25 NOTE — ED Provider Notes (Signed)
Medical screening examination/treatment/procedure(s) were conducted as a shared visit with non-physician practitioner(s) and myself.  I personally evaluated the patient during the encounter.  EKG Interpretation  Date/Time:  Saturday November 25 2020 15:21:20 EST Ventricular Rate:  74 PR Interval:    QRS Duration: 84 QT Interval:  471 QTC Calculation: 523 R Axis:   22 Text Interpretation: Ectopic atrial rhythm Nonspecific T abnormalities, diffuse leads Prolonged QT interval Since last tracing QT has lengthened Otherwise no significant change Confirmed by Mancel Bale [phone]) on 11/25/2020 4:18:15 PM  79 year old female presents with 1 week of lower abdominal discomfort as well as dysuria.  Mild hypokalemia noted.  Slightly tender to pubic region.  Abdominal CT pending.  Patient has diffuse weakness more like require admission   Lorre Nick, MD 11/25/20 1724

## 2020-11-25 NOTE — H&P (Signed)
History and Physical    Amy Hood [name] DOB: 09-29-1941 DOA: 11/25/2020  PCP: Wenda Low, MD  Patient coming from: Home  Chief Complaint: Nausea, weakness, lower abd pain  HPI: Amy Hood is a 79 y.o. female with medical history significant of CAD, DM who presented to the ED with complaints of lower abd pain, later with progressive weakness and intractable n/v being unable to tolerate even small bites prior to admit. Pt also noted to have bouts of constipation during this time requiring miralax at home. Family tried feeding bowl of rice with fish, however pt did not tolerate. On the morning of admit pt tried "one spoon" of liquid PO this AM but pt promptly vomited it up.  ED Course: In the ED, pt was noted to be afebrile with tmax of 99.38F. Covid test pending. UA suggestive of UTI. Pt was noted to have QTc of 523 on EKG with K of 3.0. Pt was given potassium and Mg replacement. Pt was started on empiric rocephin. Blood and urine cultures were requested. Hospitalist consulted for consideration for admission.  Review of Systems:  Review of Systems  Constitutional: Positive for malaise/fatigue. Negative for weight loss.  HENT: Negative for sinus pain.   Eyes: Negative for pain, discharge and redness.  Respiratory: Negative for hemoptysis, shortness of breath and stridor.   Cardiovascular: Negative for palpitations and orthopnea.  Gastrointestinal: Positive for abdominal pain, constipation, nausea and vomiting.  Genitourinary: Negative for frequency and hematuria.  Musculoskeletal: Negative for back pain, falls and neck pain.  Neurological: Negative for tremors and loss of consciousness.  Psychiatric/Behavioral: Negative for hallucinations and memory loss. The patient is not nervous/anxious.     Past Medical History:  Diagnosis Date  . Anemia   . Arthritis   . CAD (coronary artery disease) 12/28/2016   S/p NSTEMI 1/18:  LHC - oLAD, mLAD 80, pLCx 75, mRCA 25 >> PCI:  3 x 24  mm Synergy DES to mid LAD; 3.5 x 28 mm Synergy DES to proximal LCx // Echo 1/18:  Mild LVH, EF 55-60, apical inf and apical HK, Gr 1 DD, AV mean 13, MAC, trivial MR, reduced RVSF, trivial TR, PASP 35, no eff  . Cataracts, bilateral   . Essential hypertension   . Generalized weakness   . Hard of hearing   . History of atrial fibrillation    post MI - likely related to acute medical illness therefore anticoag not started  . Hyperlipidemia   . Hyponatremia   . Mild aortic stenosis 12/30/2016   Echo 1/18:  Mild LVH, EF 55-60, apical inf and apical HK, Gr 1 DD, AV mean 13, MAC, trivial MR, reduced RVSF, trivial TR, PASP 35, no eff // Echo 09/02/2018:  Severe focal basal septal hypertrophy, EF 60-65, no RWMA, Gr 2 DD, very mild AS (mean 11), mild LAE   . Type 2 diabetes mellitus ([place])     Past Surgical History:  Procedure Laterality Date  . CARDIAC CATHETERIZATION N/A 12/31/2016   Procedure: Left Heart Cath and Coronary Angiography;  Surgeon: Jettie Booze, MD;  Location: [facility];  Service: Cardiovascular;  Laterality: N/A;  . CARDIAC CATHETERIZATION N/A 12/31/2016   Procedure: Coronary Stent Intervention;  Surgeon: Jettie Booze, MD;  Location: [facility];  Service: Cardiovascular;  Laterality: N/A;  . IR KYPHO THORACIC WITH BONE BIOPSY  11/20/2018  . KYPHOPLASTY    . LEFT HEART CATH AND CORONARY ANGIOGRAPHY N/A 10/13/2020   Procedure: LEFT HEART CATH  AND CORONARY ANGIOGRAPHY;  Surgeon: Troy Sine, MD;  Location: [facility];  Service: Cardiovascular;  Laterality: N/A;     reports that she has never smoked. She has never used smokeless tobacco. She reports that she does not drink alcohol and does not use drugs.  No Known Allergies  Family History  Problem Relation Age of Onset  . Diabetes Mother     Prior to Admission medications   Medication Sig Start Date End Date Taking? Authorizing Provider  alendronate (FOSAMAX) 70 MG tablet Take 70 mg by  mouth once a week. 11/05/20   [provider]  aspirin 81 MG chewable tablet Chew 81 mg by mouth daily.    [provider]  carvedilol (COREG) 6.25 MG tablet Take 1 tablet (6.25 mg total) by mouth 2 (two) times daily with a meal. 01/02/17   Oswald Hillock, MD  cholecalciferol (VITAMIN D3) 25 MCG (1000 UT) tablet Take 1,000 Units by mouth daily.    [provider]  guaiFENesin (ROBITUSSIN) 100 MG/5ML SOLN Take 5 mLs (100 mg total) by mouth every 4 (four) hours as needed for cough or to loosen phlegm. 10/14/20   Raiford Noble Latif, DO  HYDROcodone-acetaminophen (NORCO) 7.5-325 MG tablet Take 1 tablet by mouth 3 (three) times daily as needed for pain. 10/20/20   [provider]  iron polysaccharides (NIFEREX) 150 MG capsule Take 1 capsule (150 mg total) by mouth daily. 09/21/13   Wenda Low, MD  losartan (COZAAR) 25 MG tablet TAKE 1 TABLET BY MOUTH EVERY DAY Patient taking differently: Take 25 mg by mouth daily.  08/31/19   Richardson Dopp T, PA-C  losartan (COZAAR) 50 MG tablet Take 50 mg by mouth daily. 10/25/20   [provider]  metFORMIN (GLUCOPHAGE) 500 MG tablet Take 500 mg by mouth daily with breakfast. with food 07/31/18   [provider]  ondansetron (ZOFRAN) 4 MG tablet Take 1 tablet (4 mg total) by mouth every 6 (six) hours as needed for nausea. 10/14/20   Raiford Noble Latif, DO  oxyCODONE (OXY IR/ROXICODONE) 5 MG immediate release tablet Take 1 tablet (5 mg total) by mouth every 4 (four) hours as needed for severe pain. Use 3 times daily for the next 2 days, and then 1 tablet every 4 hours as needed for pain. 11/23/18   Eugenie Filler, MD  polyethylene glycol [facility] / Floria Raveling) packet Take 17 g by mouth 2 (two) times daily. Patient taking differently: Take 17 g by mouth daily as needed for mild constipation.  11/23/18   Eugenie Filler, MD  rosuvastatin (CRESTOR) 20 MG tablet Take 1 tablet (20 mg total) by mouth daily at 6 PM.  01/02/17   Oswald Hillock, MD  senna-docusate (SENOKOT-S) 8.6-50 MG tablet Take 1 tablet by mouth at bedtime. 11/23/18   Eugenie Filler, MD    Physical Exam: Vitals:   11/25/20 1500 11/25/20 1515 11/25/20 1600 11/25/20 1627  BP: 124/83 129/80  126/79  Pulse: 75 73  72  Resp: (!) 37 (!) 21  20  Temp:   99.8 F (37.7 C)   TempSrc:   Rectal   SpO2: 98% 99%  99%    Constitutional: NAD, calm, comfortable Vitals:   11/25/20 1500 11/25/20 1515 11/25/20 1600 11/25/20 1627  BP: 124/83 129/80  126/79  Pulse: 75 73  72  Resp: (!) 37 (!) 21  20  Temp:   99.8 F (37.7 C)   TempSrc:   Rectal  SpO2: 98% 99%  99%   Eyes: PERRL, lids and conjunctivae normal ENMT: Mucous membranes are dryt. Posterior pharynx clear of any exudate or lesions.Normal dentition.  Neck: normal, supple, no masses, no thyromegaly Respiratory: clear to auscultation bilaterally, no wheezing, no crackles. Normal respiratory effort. No accessory muscle use.  Cardiovascular: Regular rate and rhythm, s1, s2 Abdomen: soft, nondistended  Musculoskeletal: no clubbing / cyanosis. No joint deformity upper and lower extremities Skin: no rashes, lesions Neurologic: CN 2-12 grossly intact. Sensation intact, Psychiatric: Normal judgment and insight. Alert and oriented x 3. Normal mood.    Labs on Admission: I have personally reviewed following labs and imaging studies  CBC: Recent Labs  Lab 11/25/20 1503  WBC 7.7  NEUTROABS 5.1  HGB 11.5*  HCT 33.6*  MCV 76.5*  PLT 711   Basic Metabolic Panel: Recent Labs  Lab 11/25/20 1503  NA 129*  K 3.0*  CL 93*  CO2 22  GLUCOSE 166*  BUN 19  CREATININE 0.67  CALCIUM 8.4*   GFR: CrCl cannot be calculated (Unknown ideal weight.). Liver Function Tests: Recent Labs  Lab 11/25/20 1503  AST 23  ALT 17  ALKPHOS 41  BILITOT 0.6  PROT 6.6  ALBUMIN 3.3*   Recent Labs  Lab 11/25/20 1503  LIPASE 30   No results for input(s): AMMONIA in the last 168  hours. Coagulation Profile: No results for input(s): INR, PROTIME in the last 168 hours. Cardiac Enzymes: No results for input(s): CKTOTAL, CKMB, CKMBINDEX, TROPONINI in the last 168 hours. BNP (last 3 results) No results for input(s): PROBNP in the last 8760 hours. HbA1C: No results for input(s): HGBA1C in the last 72 hours. CBG: No results for input(s): GLUCAP in the last 168 hours. Lipid Profile: No results for input(s): CHOL, HDL, LDLCALC, TRIG, CHOLHDL, LDLDIRECT in the last 72 hours. Thyroid Function Tests: No results for input(s): TSH, T4TOTAL, FREET4, T3FREE, THYROIDAB in the last 72 hours. Anemia Panel: No results for input(s): VITAMINB12, FOLATE, FERRITIN, TIBC, IRON, RETICCTPCT in the last 72 hours. Urine analysis:    Component Value Date/Time   COLORURINE YELLOW 11/25/2020 1501   APPEARANCEUR HAZY (A) 11/25/2020 1501   LABSPEC 1.011 11/25/2020 1501   PHURINE 7.0 11/25/2020 1501   GLUCOSEU NEGATIVE 11/25/2020 1501   HGBUR NEGATIVE 11/25/2020 1501   BILIRUBINUR NEGATIVE 11/25/2020 1501   KETONESUR NEGATIVE 11/25/2020 1501   PROTEINUR NEGATIVE 11/25/2020 1501   UROBILINOGEN 0.2 09/20/2013 0822   NITRITE POSITIVE (A) 11/25/2020 1501   LEUKOCYTESUR SMALL (A) 11/25/2020 1501   Sepsis Labs: !!!!!!!!!!!!!!!!!!!!!!!!!!!!!!!!!!!!!!!!!!!! _0 (procalcitonin:4,lacticidven:4) )No results found for this or any previous visit (from the past 240 hour(s)).   Radiological Exams on Admission: CT ABDOMEN PELVIS W CONTRAST  Result Date: 11/25/2020 CLINICAL DATA:  Lower abdominal pain, early satiety, vomiting EXAM: CT ABDOMEN AND PELVIS WITH CONTRAST TECHNIQUE: Multidetector CT imaging of the abdomen and pelvis was performed using the standard protocol following bolus administration of intravenous contrast. CONTRAST:  11m OMNIPAQUE IOHEXOL 300 MG/ML  SOLN COMPARISON:  10/12/2020, 11/03/2018 FINDINGS: Lower chest: No acute pleural or parenchymal lung disease. Hepatobiliary: No  focal liver abnormality is seen. No gallstones, gallbladder wall thickening, or biliary dilatation. Pancreas: Unremarkable. No pancreatic ductal dilatation or surrounding inflammatory changes. Spleen: Normal in size without focal abnormality. Adrenals/Urinary Tract: There is scattered bilateral areas of renal cortical scarring. Small cortical cysts are noted. 2 mm nonobstructing calculus right kidney image 22. No obstructive uropathy. The adrenals and bladder are unremarkable. Stomach/Bowel: No bowel obstruction or ileus.  Normal appendix right lower quadrant. No bowel wall thickening or inflammatory change. Vascular/Lymphatic: Aortic atherosclerosis. No enlarged abdominal or pelvic lymph nodes. Reproductive: Uterus and bilateral adnexa are unremarkable. Other: No free fluid or free gas. No abdominal wall hernia. Musculoskeletal: Stable multilevel compression deformities of the lumbar spine with previous vertebral augmentation at L1, L2, and L5. No acute bony abnormalities. Reconstructed images demonstrate no additional findings. IMPRESSION: 1. No acute intra-abdominal or intrapelvic process. 2. A 2 mm nonobstructing right renal calculus. 3. Aortic Atherosclerosis (ICD10-I70.0). Electronically Signed   By: Randa Ngo M.D.   On: 11/25/2020 17:27   DG Chest Portable 1 View  Result Date: 11/25/2020 CLINICAL DATA:  Syncope, vomiting and constipation. EXAM: PORTABLE CHEST 1 VIEW COMPARISON:  Single-view of the chest 10/11/2020. CT chest 10/12/2020. FINDINGS: Lungs clear. Heart size normal. Aortic atherosclerosis. Postoperative change of vertebral augmentation in the midthoracic spine noted. Remote bilateral rib fractures again seen. IMPRESSION: No acute disease. Aortic Atherosclerosis (ICD10-I70.0). Electronically Signed   By: Inge Rise M.D.   On: 11/25/2020 17:41    EKG: Independently reviewed. Sinus, QTc 523  Assessment/Plan Principal Problem:   UTI (urinary tract infection) Active Problems:    Hyperlipidemia   Essential hypertension   CAD (coronary artery disease)   Prolonged QT interval   Generalized weakness   Type 2 diabetes mellitus without complication ([place])   1. Symptomatic UTI without sepsis 1. Presenting with worsening weakness with intractable n/v 2. UA reviewed, suggestive of UTI 3. F/u on blood and urine cultures 4. Cont empiric rocephin 5. Repeat cmp and cbc in AM 6. COVID pending 2. HLD 1. Cont home statin as tolerated 3. HTN 1. BP stable and controlled 2. Cont home regimen as tolerated 4. CAD 1. Without chest pain 2. EKG reviewed, nonischemic 5. Prolonged QT 1. Potassium low, replaced by EDP 2. Cont on tele 3. Repeat lytes as well as Mg level in am and correct as needed with goal of K>4 and Mg>2 6. DM2 1. Hold oral hyperglycemic regimen while in hospital 2. Continue on SSI coverage 7. Weakness 1. Suspect secondary to above symptomatic UTI 2. Will consult PT  DVT prophylaxis: Lovenox subq  Code Status: Full Family Communication: Pt in room, family at bedside  Disposition Plan: uncertain at this time  Consults called:  Admission status: Observation as it would likely require less than 2 midnight stay for IVF hydration and to treat UTI   Marylu Lund MD [facility] Pager On Amion  If 7PM-7AM, please contact night-coverage  11/25/2020, 5:49 PM

## 2020-11-25 NOTE — Progress Notes (Signed)
CRITICAL VALUE ALERT  Critical Value:  Lactic Acid 3.3  Date & Time Notied:11/25/20;2041  Provider Notified: Yes  Orders Received/Actions taken: Awaiting new orders

## 2020-11-26 ENCOUNTER — Other Ambulatory Visit: Payer: Self-pay

## 2020-11-26 DIAGNOSIS — B9620 Unspecified Escherichia coli [E. coli] as the cause of diseases classified elsewhere: Secondary | ICD-10-CM | POA: Diagnosis present

## 2020-11-26 DIAGNOSIS — K5900 Constipation, unspecified: Secondary | ICD-10-CM | POA: Diagnosis present

## 2020-11-26 DIAGNOSIS — N390 Urinary tract infection, site not specified: Secondary | ICD-10-CM | POA: Diagnosis not present

## 2020-11-26 DIAGNOSIS — E119 Type 2 diabetes mellitus without complications: Secondary | ICD-10-CM | POA: Diagnosis present

## 2020-11-26 DIAGNOSIS — Z20822 Contact with and (suspected) exposure to covid-19: Secondary | ICD-10-CM | POA: Diagnosis present

## 2020-11-26 DIAGNOSIS — Z955 Presence of coronary angioplasty implant and graft: Secondary | ICD-10-CM | POA: Diagnosis not present

## 2020-11-26 DIAGNOSIS — R531 Weakness: Secondary | ICD-10-CM | POA: Diagnosis present

## 2020-11-26 DIAGNOSIS — Z7982 Long term (current) use of aspirin: Secondary | ICD-10-CM | POA: Diagnosis not present

## 2020-11-26 DIAGNOSIS — I4581 Long QT syndrome: Secondary | ICD-10-CM | POA: Diagnosis present

## 2020-11-26 DIAGNOSIS — Z7983 Long term (current) use of bisphosphonates: Secondary | ICD-10-CM | POA: Diagnosis not present

## 2020-11-26 DIAGNOSIS — Z7984 Long term (current) use of oral hypoglycemic drugs: Secondary | ICD-10-CM | POA: Diagnosis not present

## 2020-11-26 DIAGNOSIS — E876 Hypokalemia: Secondary | ICD-10-CM | POA: Diagnosis present

## 2020-11-26 DIAGNOSIS — I2510 Atherosclerotic heart disease of native coronary artery without angina pectoris: Secondary | ICD-10-CM | POA: Diagnosis present

## 2020-11-26 DIAGNOSIS — N200 Calculus of kidney: Secondary | ICD-10-CM | POA: Diagnosis present

## 2020-11-26 DIAGNOSIS — Z79899 Other long term (current) drug therapy: Secondary | ICD-10-CM | POA: Diagnosis not present

## 2020-11-26 DIAGNOSIS — E785 Hyperlipidemia, unspecified: Secondary | ICD-10-CM | POA: Diagnosis present

## 2020-11-26 DIAGNOSIS — I10 Essential (primary) hypertension: Secondary | ICD-10-CM | POA: Diagnosis not present

## 2020-11-26 DIAGNOSIS — Z79891 Long term (current) use of opiate analgesic: Secondary | ICD-10-CM | POA: Diagnosis not present

## 2020-11-26 DIAGNOSIS — I252 Old myocardial infarction: Secondary | ICD-10-CM | POA: Diagnosis not present

## 2020-11-26 DIAGNOSIS — G8929 Other chronic pain: Secondary | ICD-10-CM | POA: Diagnosis present

## 2020-11-26 DIAGNOSIS — I480 Paroxysmal atrial fibrillation: Secondary | ICD-10-CM | POA: Diagnosis present

## 2020-11-26 DIAGNOSIS — Z833 Family history of diabetes mellitus: Secondary | ICD-10-CM | POA: Diagnosis not present

## 2020-11-26 LAB — CBC
HCT: 38.4 % (ref 36.0–46.0)
Hemoglobin: 13.1 g/dL (ref 12.0–15.0)
MCH: 25.8 pg — ABNORMAL LOW (ref 26.0–34.0)
MCHC: 34.1 g/dL (ref 30.0–36.0)
MCV: 75.6 fL — ABNORMAL LOW (ref 80.0–100.0)
Platelets: 237 10*3/uL (ref 150–400)
RBC: 5.08 MIL/uL (ref 3.87–5.11)
RDW: 13.2 % (ref 11.5–15.5)
WBC: 8.8 10*3/uL (ref 4.0–10.5)
nRBC: 0 % (ref 0.0–0.2)

## 2020-11-26 LAB — COMPREHENSIVE METABOLIC PANEL
ALT: 18 U/L (ref 0–44)
AST: 21 U/L (ref 15–41)
Albumin: 3.3 g/dL — ABNORMAL LOW (ref 3.5–5.0)
Alkaline Phosphatase: 45 U/L (ref 38–126)
Anion gap: 13 (ref 5–15)
BUN: 10 mg/dL (ref 8–23)
CO2: 24 mmol/L (ref 22–32)
Calcium: 7.9 mg/dL — ABNORMAL LOW (ref 8.9–10.3)
Chloride: 95 mmol/L — ABNORMAL LOW (ref 98–111)
Creatinine, Ser: 0.38 mg/dL — ABNORMAL LOW (ref 0.44–1.00)
GFR, Estimated: >60 mL/min (ref >60)
Glucose, Bld: 153 mg/dL — ABNORMAL HIGH (ref 70–99)
Potassium: 2.6 mmol/L — CL (ref 3.5–5.1)
Sodium: 132 mmol/L — ABNORMAL LOW (ref 135–145)
Total Bilirubin: 0.6 mg/dL (ref 0.3–1.2)
Total Protein: 6.7 g/dL (ref 6.5–8.1)

## 2020-11-26 LAB — MAGNESIUM: Magnesium: 1.7 mg/dL (ref 1.7–2.4)

## 2020-11-26 LAB — GLUCOSE, CAPILLARY
Glucose-Capillary: 132 mg/dL — ABNORMAL HIGH (ref 70–99)
Glucose-Capillary: 168 mg/dL — ABNORMAL HIGH (ref 70–99)
Glucose-Capillary: 174 mg/dL — ABNORMAL HIGH (ref 70–99)
Glucose-Capillary: 99 mg/dL (ref 70–99)

## 2020-11-26 MED ORDER — ASPIRIN 81 MG PO CHEW
81.0000 mg | CHEWABLE_TABLET | Freq: Every day | ORAL | Status: DC
  Administered 2020-11-26 – 2020-11-27 (×2): 81 mg via ORAL
  Filled 2020-11-26 (×2): qty 1

## 2020-11-26 MED ORDER — POTASSIUM CHLORIDE CRYS ER 20 MEQ PO TBCR
60.0000 meq | EXTENDED_RELEASE_TABLET | Freq: Two times a day (BID) | ORAL | Status: AC
  Administered 2020-11-26 (×2): 60 meq via ORAL
  Filled 2020-11-26 (×2): qty 3

## 2020-11-26 MED ORDER — MAGNESIUM SULFATE 4 GM/100ML IV SOLN: 4.0000 g | Freq: Once | INTRAVENOUS | Status: DC

## 2020-11-26 MED ORDER — SODIUM CHLORIDE 0.9 % IV BOLUS
500.0000 mL | Freq: Once | INTRAVENOUS | Status: AC
  Administered 2020-11-26: 19:00:00 500 mL via INTRAVENOUS

## 2020-11-26 MED ORDER — HYDROCODONE-ACETAMINOPHEN 7.5-325 MG PO TABS: 1.0000 | ORAL_TABLET | Freq: Three times a day (TID) | ORAL | Status: DC | PRN

## 2020-11-26 MED ORDER — CARVEDILOL 6.25 MG PO TABS
6.2500 mg | ORAL_TABLET | Freq: Two times a day (BID) | ORAL | Status: DC
  Administered 2020-11-26 (×2): 6.25 mg via ORAL
  Filled 2020-11-26 (×2): qty 1

## 2020-11-26 MED ORDER — ROSUVASTATIN CALCIUM 20 MG PO TABS
20.0000 mg | ORAL_TABLET | Freq: Every day | ORAL | Status: DC
  Administered 2020-11-26: 18:00:00 20 mg via ORAL
  Filled 2020-11-26: qty 1

## 2020-11-26 MED ORDER — MAGNESIUM SULFATE 2 GM/50ML IV SOLN
2.0000 g | Freq: Once | INTRAVENOUS | Status: AC
  Administered 2020-11-26: 08:00:00 2 g via INTRAVENOUS
  Filled 2020-11-26: qty 50

## 2020-11-26 MED ORDER — SODIUM CHLORIDE 0.9 % IV BOLUS: 1000.0000 mL | Freq: Once | INTRAVENOUS | Status: DC

## 2020-11-26 MED ORDER — POLYETHYLENE GLYCOL 3350 17 G PO PACK: 17.0000 g | PACK | Freq: Every day | ORAL | Status: DC | PRN

## 2020-11-26 NOTE — Progress Notes (Signed)
Noted patient's BP in the 80s, rechecked manually 80/42, Dr. Rhona Leavens notified order given for NS bolus, infusing, report given to night shift nurse to FF/U with plan of care.

## 2020-11-26 NOTE — Progress Notes (Signed)
PROGRESS NOTE    Amy Hood  SEG:[id number] DOB: 09/28/1941 DOA: 11/25/2020 PCP: Georgann Housekeeper, MD    Brief Narrative:   79 y.o. female with medical history significant of CAD, DM who presented to the ED with complaints of lower abd pain, later with progressive weakness and intractable n/v being unable to tolerate even small bites prior to admit. Pt also noted to have bouts of constipation during this time requiring miralax at home. Family tried feeding bowl of rice with fish, however pt did not tolerate. On the morning of admit pt tried "one spoon" of liquid PO this AM but pt promptly vomited it up.  ED Course: In the ED, pt was noted to be afebrile with tmax of 99.76F. Covid test pending. UA suggestive of UTI. Pt was noted to have QTc of 523 on EKG with K of 3.0. Pt was given potassium and Mg replacement. Pt was started on empiric rocephin. Blood and urine cultures were requested. Hospitalist consulted for consideration for admission.  Assessment & Plan:   Principal Problem:   UTI (urinary tract infection) Active Problems:   Hyperlipidemia   Essential hypertension   CAD (coronary artery disease)   Prolonged QT interval   Generalized weakness   Type 2 diabetes mellitus without complication (HCC)  1. Symptomatic UTI without sepsis 1. Presenting with worsening weakness with intractable n/v 2. UA reviewed, suggestive of UTI 3. F/u on blood and urine cultures, thus far blood cx neg 4. Cont empiric rocephin for now 5. Clinically improved with pt denying nausea 2. HLD 1. Cont home statin as pt tolerates 3. HTN 1. BP stable and controlled 2. Cont home regimen as tolerated 4. CAD 1. Without chest pain 2. EKG on admit was reviewed, nonischemic 5. Prolonged QT 1. Potassium remains low, replace 2. Cont on tele 3. Repeat lytes as well as Mg level in am and correct as needed with goal of K>4 and Mg>2 6. DM2 1. Hold oral hyperglycemic regimen while in hospital 2. Continue on SSI  coverage as needed 7. Weakness 1. Suspect secondary to above symptomatic UTI 2. PT seen with recs for HHPT  DVT prophylaxis: Lovenox subq Code Status: Full Family Communication: Pt in room, family at bedside  Status is: Observation  The patient will require care spanning > 2 midnights and should be moved to inpatient because: Persistent severe electrolyte disturbances and Inpatient level of care appropriate due to severity of illness  Dispo: The patient is from: Home              Anticipated d/c is to: Home              Anticipated d/c date is: 1 day              Patient currently is not medically stable to d/c.   Consultants:   Procedures:     Antimicrobials: Anti-infectives (From admission, onward)   Start     Dose/Rate Route Frequency Ordered Stop   11/26/20 1800  cefTRIAXone (ROCEPHIN) 1 g in sodium chloride 0.9 % 100 mL IVPB        1 g 200 mL/hr over 30 Minutes Intravenous Every 24 hours 11/25/20 1818     11/25/20 1745  cefTRIAXone (ROCEPHIN) 1 g in sodium chloride 0.9 % 100 mL IVPB        1 g 200 mL/hr over 30 Minutes Intravenous  Once 11/25/20 1731 11/25/20 2011       Subjective: No more nausea. Eager to advance diet  Objective: Vitals:   11/25/20 2039 11/26/20 0039 11/26/20 0606 11/26/20 1255  BP: (!) 132/92 119/72 114/69 (!) 88/60  Pulse: 81 82 81 78  Resp: 20 20 20 18   Temp: 98 F (36.7 C) 98.2 F (36.8 C) 98 F (36.7 C) (!) 97.5 F (36.4 C)  TempSrc: Oral  Oral   SpO2: 94% 94% 98% 97%  Weight: 55.6 kg  58 kg   Height: 5' (1.524 m)       Intake/Output Summary (Last 24 hours) at 11/26/2020 1600 Last data filed at 11/26/2020 1152 Gross per 24 hour  Intake 1050 ml  Output 1350 ml  Net -300 ml   Filed Weights   11/25/20 2039 11/26/20 0606  Weight: 55.6 kg 58 kg    Examination: General exam: Awake, laying in bed, in nad Respiratory system: Normal respiratory effort, no wheezing Cardiovascular system: regular rate, s1, s2 Gastrointestinal  system: Soft, nondistended, positive BS Central nervous system: CN2-12 grossly intact, strength intact Extremities: Perfused, no clubbing Skin: Normal skin turgor, no notable skin lesions seen Psychiatry: Mood normal // no visual hallucinations   Data Reviewed: I have personally reviewed following labs and imaging studies  CBC: Recent Labs  Lab 11/25/20 1503 11/25/20 1815 11/26/20 0543  WBC 7.7 8.8 8.8  NEUTROABS 5.1  --   --   HGB 11.5* 12.4 13.1  HCT 33.6* 36.4 38.4  MCV 76.5* 75.5* 75.6*  PLT 230 248 237   Basic Metabolic Panel: Recent Labs  Lab 11/25/20 1503 11/25/20 1815 11/26/20 0543  NA 129*  --  132*  K 3.0*  --  2.6*  CL 93*  --  95*  CO2 22  --  24  GLUCOSE 166*  --  153*  BUN 19  --  10  CREATININE 0.67 0.56 0.38*  CALCIUM 8.4*  --  7.9*  MG 1.6*  --  1.7   GFR: Estimated Creatinine Clearance: 44.7 mL/min (A) (by C-G formula based on SCr of 0.38 mg/dL (L)). Liver Function Tests: Recent Labs  Lab 11/25/20 1503 11/26/20 0543  AST 23 21  ALT 17 18  ALKPHOS 41 45  BILITOT 0.6 0.6  PROT 6.6 6.7  ALBUMIN 3.3* 3.3*   Recent Labs  Lab 11/25/20 1503  LIPASE 30   No results for input(s): AMMONIA in the last 168 hours. Coagulation Profile: No results for input(s): INR, PROTIME in the last 168 hours. Cardiac Enzymes: No results for input(s): CKTOTAL, CKMB, CKMBINDEX, TROPONINI in the last 168 hours. BNP (last 3 results) No results for input(s): PROBNP in the last 8760 hours. HbA1C: Recent Labs    11/25/20 1815  HGBA1C 7.2*   CBG: Recent Labs  Lab 11/25/20 2047 11/26/20 0737 11/26/20 1103  GLUCAP 153* 132* 168*   Lipid Profile: No results for input(s): CHOL, HDL, LDLCALC, TRIG, CHOLHDL, LDLDIRECT in the last 72 hours. Thyroid Function Tests: No results for input(s): TSH, T4TOTAL, FREET4, T3FREE, THYROIDAB in the last 72 hours. Anemia Panel: No results for input(s): VITAMINB12, FOLATE, FERRITIN, TIBC, IRON, RETICCTPCT in the last 72  hours. Sepsis Labs: Recent Labs  Lab 11/25/20 1657 11/25/20 2107  LATICACIDVEN 3.3* 2.5*    Recent Results (from the past 240 hour(s))  Resp Panel by RT-PCR (Flu A&B, Covid) Nasopharyngeal Swab     Status: None   Collection Time: 11/25/20  5:01 PM   Specimen: Nasopharyngeal Swab; Nasopharyngeal(NP) swabs in vial transport medium  Result Value Ref Range Status   SARS Coronavirus 2 by RT PCR NEGATIVE NEGATIVE  Final    Comment: (NOTE) SARS-CoV-2 target nucleic acids are NOT DETECTED.  The SARS-CoV-2 RNA is generally detectable in upper respiratory specimens during the acute phase of infection. The lowest concentration of SARS-CoV-2 viral copies this assay can detect is 138 copies/mL. A negative result does not preclude SARS-Cov-2 infection and should not be used as the sole basis for treatment or other patient management decisions. A negative result may occur with  improper specimen collection/handling, submission of specimen other than nasopharyngeal swab, presence of viral mutation(s) within the areas targeted by this assay, and inadequate number of viral copies(<138 copies/mL). A negative result must be combined with clinical observations, patient history, and epidemiological information. The expected result is Negative.  Fact Sheet for Patients:  BloggerCourse.comhttps://www.fda.gov/media/152166/download  Fact Sheet for Healthcare Providers:  SeriousBroker.ithttps://www.fda.gov/media/152162/download  This test is no t yet approved or cleared by the [place] FDA and  has been authorized for detection and/or diagnosis of SARS-CoV-2 by FDA under an Emergency Use Authorization (EUA). This EUA will remain  in effect (meaning this test can be used) for the duration of the COVID-19 declaration under Section 564(b)(1) of the Act, 21 U.S.C.section 360bbb-3(b)(1), unless the authorization is terminated  or revoked sooner.       Influenza A by PCR NEGATIVE NEGATIVE Final   Influenza B by PCR NEGATIVE  NEGATIVE Final    Comment: (NOTE) The Xpert Xpress SARS-CoV-2/FLU/RSV plus assay is intended as an aid in the diagnosis of influenza from Nasopharyngeal swab specimens and should not be used as a sole basis for treatment. Nasal washings and aspirates are unacceptable for Xpert Xpress SARS-CoV-2/FLU/RSV testing.  Fact Sheet for Patients: BloggerCourse.comhttps://www.fda.gov/media/152166/download  Fact Sheet for Healthcare Providers: SeriousBroker.ithttps://www.fda.gov/media/152162/download  This test is not yet approved or cleared by the [place] FDA and has been authorized for detection and/or diagnosis of SARS-CoV-2 by FDA under an Emergency Use Authorization (EUA). This EUA will remain in effect (meaning this test can be used) for the duration of the COVID-19 declaration under Section 564(b)(1) of the Act, 21 U.S.C. section 360bbb-3(b)(1), unless the authorization is terminated or revoked.  Performed at [facility], [address] [place], [place] [address]   Blood culture (routine x 2)     Status: None (Preliminary result)   Collection Time: 11/25/20  6:00 PM   Specimen: BLOOD  Result Value Ref Range Status   Specimen Description   Final    BLOOD RIGHT ANTECUBITAL Performed at [facility], [address] [place], [place] [address]    Special Requests   Final    BOTTLES DRAWN AEROBIC AND ANAEROBIC Blood Culture results may not be optimal due to an inadequate volume of blood received in culture bottles Performed at [facility], [address] [place], [place] [address]    Culture   Final    NO GROWTH < 24 HOURS Performed at [facility], [address] [place], [place] [address]    Report Status PENDING  Incomplete  Blood culture (routine x 2)     Status: None (Preliminary result)   Collection Time: 11/25/20  6:00 PM   Specimen: BLOOD  Result Value Ref Range Status   Specimen Description   Final    BLOOD LEFT HAND Performed at  [facility], [address] [place], [place] [address]    Special Requests   Final    BOTTLES DRAWN AEROBIC AND ANAEROBIC Blood Culture results may not be optimal due to  an inadequate volume of blood received in culture bottles Performed at [facility], [address], [place], [place] [address]    Culture   Final    NO GROWTH < 24 HOURS Performed at [facility], [address], [place], [place] [address]    Report Status PENDING  Incomplete     Radiology Studies: CT ABDOMEN PELVIS W CONTRAST  Result Date: 11/25/2020 CLINICAL DATA:  Lower abdominal pain, early satiety, vomiting EXAM: CT ABDOMEN AND PELVIS WITH CONTRAST TECHNIQUE: Multidetector CT imaging of the abdomen and pelvis was performed using the standard protocol following bolus administration of intravenous contrast. CONTRAST:  OMNIPAQUE IOHEXOL 300 MG/ML  SOLN COMPARISON:  10/12/2020, 11/03/2018 FINDINGS: Lower chest: No acute pleural or parenchymal lung disease. Hepatobiliary: No focal liver abnormality is seen. No gallstones, gallbladder wall thickening, or biliary dilatation. Pancreas: Unremarkable. No pancreatic ductal dilatation or surrounding inflammatory changes. Spleen: Normal in size without focal abnormality. Adrenals/Urinary Tract: There is scattered bilateral areas of renal cortical scarring. Small cortical cysts are noted. 2 mm nonobstructing calculus right kidney image 22. No obstructive uropathy. The adrenals and bladder are unremarkable. Stomach/Bowel: No bowel obstruction or ileus. Normal appendix right lower quadrant. No bowel wall thickening or inflammatory change. Vascular/Lymphatic: Aortic atherosclerosis. No enlarged abdominal or pelvic lymph nodes. Reproductive: Uterus and bilateral adnexa are unremarkable. Other: No free fluid or free gas. No abdominal wall hernia. Musculoskeletal: Stable multilevel compression deformities of the lumbar spine with previous  vertebral augmentation at L1, L2, and L5. No acute bony abnormalities. Reconstructed images demonstrate no additional findings. IMPRESSION: 1. No acute intra-abdominal or intrapelvic process. 2. A 2 mm nonobstructing right renal calculus. 3. Aortic Atherosclerosis (ICD10-I70.0). Electronically Signed   By: Sharlet Salina M.D.   On: 11/25/2020 17:27   DG Chest Portable 1 View  Result Date: 11/25/2020 CLINICAL DATA:  Syncope, vomiting and constipation. EXAM: PORTABLE CHEST 1 VIEW COMPARISON:  Single-view of the chest 10/11/2020. CT chest 10/12/2020. FINDINGS: Lungs clear. Heart size normal. Aortic atherosclerosis. Postoperative change of vertebral augmentation in the midthoracic spine noted. Remote bilateral rib fractures again seen. IMPRESSION: No acute disease. Aortic Atherosclerosis (ICD10-I70.0). Electronically Signed   By: Drusilla Kanner M.D.   On: 11/25/2020 17:41    Scheduled Meds: . aspirin  81 mg Oral Daily  . carvedilol  6.25 mg Oral BID WC  . enoxaparin (LOVENOX) injection  40 mg Subcutaneous Q24H  . feeding supplement  1 Container Oral TID BM  . insulin aspart  0-15 Units Subcutaneous TID WC  . insulin aspart  0-5 Units Subcutaneous QHS  . potassium chloride  60 mEq Oral BID  . rosuvastatin  20 mg Oral q1800   Continuous Infusions: . sodium chloride 75 mL/hr at 11/26/20 1546  . cefTRIAXone (ROCEPHIN)  IV       LOS: 0 days   Rickey Barbara, MD [facility] Pager On Amion  If 7PM-7AM, please contact night-coverage 11/26/2020, 4:00 PM

## 2020-11-26 NOTE — Evaluation (Signed)
Physical Therapy Evaluation Patient Details Name: Amy Hood MRN: [id number] DOB: Sep 03, 1941 Today's Date: 11/26/2020   History of Present Illness  79 yo female admitted with weakness, abdominal pain. Dx: UTI. Hx of CAD, NSTEMI, Afib, DM, chronic pain  Clinical Impression  On eval, pt required Min assist for mobility. She walked ~60 feet with use of a RW. Pt presents with general weakness, decreased activity tolerance, and impaired gait and balance. Daughter present during session-assisted with interpreting and encouraging pt to participate. Pt appears fatigued and weak. Assisted pt into recliner to encourage OOB. Will continue to follow and progress activity as tolerated.     Follow Up Recommendations Home health PT;Supervision/Assistance - 24 hour (HHPT if pt/family are agreeable)    Equipment Recommendations  None recommended by PT    Recommendations for Other Services       Precautions / Restrictions Precautions Precautions: Fall Restrictions Weight Bearing Restrictions: No      Mobility  Bed Mobility Overal bed mobility: Needs Assistance Bed Mobility: Supine to Sit     Supine to sit: Min guard;HOB elevated     General bed mobility comments: Increased time. Pt relied on bedrail    Transfers Overall transfer level: Needs assistance Equipment used: Rolling walker (2 wheeled) Transfers: Sit to/from Stand Sit to Stand: Min assist         General transfer comment: Assist to rise, stabilize, control descent. Cues for hand placement.  Ambulation/Gait Ambulation/Gait assistance: Min assist Gait Distance (Feet): 60 Feet Assistive device: Rolling walker (2 wheeled) Gait Pattern/deviations: Step-through pattern;Decreased stride length     General Gait Details: slow gait speed. pt tends to keep RW too far ahead. distance limited by fatigue, weakness.  Stairs            Wheelchair Mobility    Modified Rankin (Stroke Patients Only)       Balance  Overall balance assessment: Needs assistance         Standing balance support: Bilateral upper extremity supported Standing balance-Leahy Scale: Poor                               Pertinent Vitals/Pain Pain Assessment: Faces Faces Pain Scale: No hurt    Home Living Family/patient expects to be discharged to:: Private residence Living Arrangements: Spouse/significant other;Children Available Help at Discharge: Family;Available 24 hours/day Type of Home: House Home Access: Level entry     Home Layout: One level Home Equipment: Walker - 2 wheels;Cane - single point;Wheelchair - manual Additional Comments: per daughter, pt ambulates wtih a RW and is independent with ADLs    Prior Function Level of Independence: Independent with assistive device(s)         Comments: uses RW     Hand Dominance        Extremity/Trunk Assessment   Upper Extremity Assessment Upper Extremity Assessment: Generalized weakness    Lower Extremity Assessment Lower Extremity Assessment: Generalized weakness    Cervical / Trunk Assessment Cervical / Trunk Assessment: Normal  Communication   Communication: Prefers language other than English (daughter provided history and assisted with interpreting)  Cognition Arousal/Alertness: Awake/alert Behavior During Therapy: WFL for tasks assessed/performed Overall Cognitive Status: Within Functional Limits for tasks assessed                                 General Comments: pt appears tired and weak  General Comments      Exercises     Assessment/Plan    PT Assessment Patient needs continued PT services  PT Problem List Decreased strength;Decreased mobility;Decreased balance;Decreased activity tolerance;Decreased knowledge of use of DME       PT Treatment Interventions DME instruction;Gait training;Therapeutic activities;Therapeutic exercise;Patient/family education;Balance training;Functional mobility  training    PT Goals (Current goals can be found in the Care Plan section)  Acute Rehab PT Goals Patient Stated Goal: none stated by pt. daughter wants her to get better, stronger PT Goal Formulation: With family Time For Goal Achievement: 12/17/20 Potential to Achieve Goals: Good    Frequency Min 3X/week   Barriers to discharge        Co-evaluation               AM-PAC PT "6 Clicks" Mobility  Outcome Measure Help needed turning from your back to your side while in a flat bed without using bedrails?: A Little Help needed moving from lying on your back to sitting on the side of a flat bed without using bedrails?: A Little Help needed moving to and from a bed to a chair (including a wheelchair)?: A Little Help needed standing up from a chair using your arms (e.g., wheelchair or bedside chair)?: A Little Help needed to walk in hospital room?: A Little Help needed climbing 3-5 steps with a railing? : A Little 6 Click Score: 18    End of Session Equipment Utilized During Treatment: Gait belt Activity Tolerance: Patient tolerated treatment well;Patient limited by fatigue Patient left: in chair;with call bell/phone within reach;with chair alarm set;with family/visitor present   PT Visit Diagnosis: Muscle weakness (generalized) (M62.81)    Time: 1044-1100 PT Time Calculation (min) (ACUTE ONLY): 16 min   Charges:   PT Evaluation $PT Eval Moderate Complexity: 1 Mod            Faye Ramsay, PT Acute Rehabilitation  Office: [phone] Pager: [phone]

## 2020-11-26 NOTE — Progress Notes (Signed)
CRITICAL VALUE ALERT  Critical Value: Potassium level- 2.6   Date & Time Notied:  11/26/20 @ 0727  Provider Notified: Dr. Rhona Leavens  Orders Received/Actions taken: Written by MD

## 2020-11-26 NOTE — Progress Notes (Signed)
Patient/daughter educated on visitors wearing mask at all times even when in the room.Daughter kept saying she is in the room with her mom why does she have to wear mask, remind her that staff come in the room as well; policy offered to visitor but she refused to take it.

## 2020-11-27 LAB — COMPREHENSIVE METABOLIC PANEL
ALT: 20 U/L (ref 0–44)
AST: 24 U/L (ref 15–41)
Albumin: 2.8 g/dL — ABNORMAL LOW (ref 3.5–5.0)
Alkaline Phosphatase: 38 U/L (ref 38–126)
Anion gap: 9 (ref 5–15)
BUN: 7 mg/dL — ABNORMAL LOW (ref 8–23)
CO2: 22 mmol/L (ref 22–32)
Calcium: 7.5 mg/dL — ABNORMAL LOW (ref 8.9–10.3)
Chloride: 103 mmol/L (ref 98–111)
Creatinine, Ser: 0.34 mg/dL — ABNORMAL LOW (ref 0.44–1.00)
GFR, Estimated: >60 mL/min (ref >60)
Glucose, Bld: 144 mg/dL — ABNORMAL HIGH (ref 70–99)
Potassium: 4.1 mmol/L (ref 3.5–5.1)
Sodium: 134 mmol/L — ABNORMAL LOW (ref 135–145)
Total Bilirubin: 0.4 mg/dL (ref 0.3–1.2)
Total Protein: 5.7 g/dL — ABNORMAL LOW (ref 6.5–8.1)

## 2020-11-27 LAB — GLUCOSE, CAPILLARY
Glucose-Capillary: 140 mg/dL — ABNORMAL HIGH (ref 70–99)
Glucose-Capillary: 284 mg/dL — ABNORMAL HIGH (ref 70–99)

## 2020-11-27 LAB — MAGNESIUM: Magnesium: 1.6 mg/dL — ABNORMAL LOW (ref 1.7–2.4)

## 2020-11-27 MED ORDER — CEFPODOXIME PROXETIL 100 MG PO TABS: 100.0000 mg | ORAL_TABLET | Freq: Two times a day (BID) | ORAL | 0 refills | Status: AC

## 2020-11-27 MED ORDER — MAGNESIUM SULFATE 4 GM/100ML IV SOLN
4.0000 g | Freq: Once | INTRAVENOUS | Status: AC
  Administered 2020-11-27: 09:00:00 4 g via INTRAVENOUS
  Filled 2020-11-27: qty 100

## 2020-11-27 NOTE — Discharge Summary (Signed)
Physician Discharge Summary  Amy Hood VOH:[id number] DOB: [birth year] DOA: 11/25/2020  PCP: Georgann Housekeeper, MD  Admit date: 11/25/2020 Discharge date: 11/27/2020  Admitted From: Home Disposition:  Home  Recommendations for Outpatient Follow-up:  1. Follow up with PCP in 1-2 weeks 2. Please monitor BP and resume bp meds when able. BP meds were held secondary to soft BP while in hospital  Home Health:PT   Discharge Condition:Improved CODE STATUS:Full Diet recommendation: Diabetic   Brief/Interim Summary: 79 y.o.femalewith medical history significant ofCAD, DM who presented to the ED with complaints of lower abd pain, later with progressive weakness and intractable n/v being unable to tolerate even small bites prior to admit. Pt also noted to have bouts of constipation during this time requiring miralax at home. Family tried feeding bowl of rice with fish, however pt did not tolerate. On the morning of admit pt tried "one spoon" of liquid PO this AM but pt promptly vomited it up.  ED Course:In the ED, pt was noted to be afebrile with tmax of 99.56F. Covid test pending. UA suggestive of UTI. Pt was noted to have QTc of 523 on EKG with K of 3.0. Pt was given potassium and Mg replacement. Pt was started on empiric rocephin. Blood and urine cultures were requested. Hospitalist consulted for consideration for admission.  Discharge Diagnoses:  Principal Problem:   UTI (urinary tract infection) Active Problems:   Hyperlipidemia   Essential hypertension   CAD (coronary artery disease)   Prolonged QT interval   Generalized weakness   Type 2 diabetes mellitus without complication (HCC)  1. Symptomatic ecoli UTI without sepsis 1. Presenting with worsening weakness with intractable n/v 2. UA reviewed, suggestive of UTI 3. Blood cx neg 4. Urine cx pos for ecoli. 5. Clinically improved with empiric rocephin. Complete course with vantin on d/c 6. Pt tolerated PO intake without  issues 2. HLD 1. Cont home statin as pt tolerates 3. HTN 1. BP stable and controlled 2. Cont home regimen as tolerated 4. CAD 1. Without chest pain 2. EKG on admit was reviewed, nonischemic 5. Prolonged QT 1. Potassium remains low, replace 2. Cont on tele 3. Repeat lytes as well as Mg level in am and correct as needed with goal of K>4 and Mg>2 6. DM2 1. Hold oral hyperglycemic regimen while in hospital 2. Continue on SSI coverage as needed 7. Weakness 1. Suspect secondary to above symptomatic UTI 2. PT seen with recs for HHPT  Discharge Instructions   Allergies as of 11/27/2020      Reactions   Lisinopril Cough      Medication List    STOP taking these medications   carvedilol 6.25 MG tablet Commonly known as: COREG   losartan 25 MG tablet Commonly known as: COZAAR   losartan 50 MG tablet Commonly known as: COZAAR   ondansetron 4 MG tablet Commonly known as: ZOFRAN   oxyCODONE 5 MG immediate release tablet Commonly known as: Oxy IR/ROXICODONE     TAKE these medications   alendronate 70 MG tablet Commonly known as: FOSAMAX Take 70 mg by mouth once a week.   aspirin 81 MG chewable tablet Chew 81 mg by mouth daily.   cefpodoxime 100 MG tablet Commonly known as: VANTIN Take 1 tablet (100 mg total) by mouth 2 (two) times daily for 3 days.   cholecalciferol 25 MCG (1000 UNIT) tablet Commonly known as: VITAMIN D3 Take 1,000 Units by mouth daily.   guaiFENesin 100 MG/5ML Soln Commonly known as: ROBITUSSIN Take  5 mLs (100 mg total) by mouth every 4 (four) hours as needed for cough or to loosen phlegm.   HYDROcodone-acetaminophen 7.5-325 MG tablet Commonly known as: NORCO Take 1 tablet by mouth 3 (three) times daily as needed for pain.   iron polysaccharides 150 MG capsule Commonly known as: NIFEREX Take 1 capsule (150 mg total) by mouth daily.   metFORMIN 500 MG tablet Commonly known as: GLUCOPHAGE Take 500 mg by mouth daily with breakfast. with  food   polyethylene glycol 17 g packet Commonly known as: MIRALAX / GLYCOLAX Take 17 g by mouth 2 (two) times daily. What changed:   when to take this  reasons to take this   rosuvastatin 20 MG tablet Commonly known as: CRESTOR Take 1 tablet (20 mg total) by mouth daily at 6 PM.   senna-docusate 8.6-50 MG tablet Commonly known as: Senokot-S Take 1 tablet by mouth at bedtime.       Follow-up Information    Georgann Housekeeper, MD. Schedule an appointment as soon as possible for a visit in 1 week(s).   Specialty: Internal Medicine Contact information: [address] [place] [address] [phone]        Pricilla Riffle, MD .   Specialty: Cardiology Contact information: [address] [place] [address] [phone]              Allergies  Allergen Reactions  . Lisinopril Cough    Procedures/Studies: CT ABDOMEN PELVIS W CONTRAST  Result Date: 11/25/2020 CLINICAL DATA:  Lower abdominal pain, early satiety, vomiting EXAM: CT ABDOMEN AND PELVIS WITH CONTRAST TECHNIQUE: Multidetector CT imaging of the abdomen and pelvis was performed using the standard protocol following bolus administration of intravenous contrast. CONTRAST:  OMNIPAQUE IOHEXOL 300 MG/ML  SOLN COMPARISON:  10/12/2020, 11/03/2018 FINDINGS: Lower chest: No acute pleural or parenchymal lung disease. Hepatobiliary: No focal liver abnormality is seen. No gallstones, gallbladder wall thickening, or biliary dilatation. Pancreas: Unremarkable. No pancreatic ductal dilatation or surrounding inflammatory changes. Spleen: Normal in size without focal abnormality. Adrenals/Urinary Tract: There is scattered bilateral areas of renal cortical scarring. Small cortical cysts are noted. 2 mm nonobstructing calculus right kidney image 22. No obstructive uropathy. The adrenals and bladder are unremarkable. Stomach/Bowel: No bowel obstruction or ileus. Normal appendix right lower  quadrant. No bowel wall thickening or inflammatory change. Vascular/Lymphatic: Aortic atherosclerosis. No enlarged abdominal or pelvic lymph nodes. Reproductive: Uterus and bilateral adnexa are unremarkable. Other: No free fluid or free gas. No abdominal wall hernia. Musculoskeletal: Stable multilevel compression deformities of the lumbar spine with previous vertebral augmentation at L1, L2, and L5. No acute bony abnormalities. Reconstructed images demonstrate no additional findings. IMPRESSION: 1. No acute intra-abdominal or intrapelvic process. 2. A 2 mm nonobstructing right renal calculus. 3. Aortic Atherosclerosis (ICD10-I70.0). Electronically Signed   By: Sharlet Salina M.D.   On: 11/25/2020 17:27   DG Chest Portable 1 View  Result Date: 11/25/2020 CLINICAL DATA:  Syncope, vomiting and constipation. EXAM: PORTABLE CHEST 1 VIEW COMPARISON:  Single-view of the chest 10/11/2020. CT chest 10/12/2020. FINDINGS: Lungs clear. Heart size normal. Aortic atherosclerosis. Postoperative change of vertebral augmentation in the midthoracic spine noted. Remote bilateral rib fractures again seen. IMPRESSION: No acute disease. Aortic Atherosclerosis (ICD10-I70.0). Electronically Signed   By: Drusilla Kanner M.D.   On: 11/25/2020 17:41     Subjective: Eager to go home  Discharge Exam: Vitals:   11/27/20 0528 11/27/20 1229  BP: (!) 150/76 105/65  Pulse: 80 72  Resp: 20 (!) 22  Temp: 97.8 F (36.6 C) 98.5 F (36.9 C)  SpO2: 98% 96%   Vitals:   11/26/20 1942 11/27/20 0035 11/27/20 0528 11/27/20 1229  BP: 109/64 123/72 (!) 150/76 105/65  Pulse: 65 72 80 72  Resp: 16  20 (!) 22  Temp: 98 F (36.7 C)  97.8 F (36.6 C) 98.5 F (36.9 C)  TempSrc: Oral  Oral Oral  SpO2: 95%  98% 96%  Weight:      Height:        General: Pt is alert, awake, not in acute distress Cardiovascular: RRR, S1/S2 +, no rubs, no gallops Respiratory: CTA bilaterally, no wheezing, no rhonchi Abdominal: Soft, NT, ND, bowel  sounds + Extremities: no edema, no cyanosis   The results of significant diagnostics from this hospitalization (including imaging, microbiology, ancillary and laboratory) are listed below for reference.     Microbiology: Recent Results (from the past 240 hour(s))  Urine culture     Status: Abnormal (Preliminary result)   Collection Time: 11/25/20  3:01 PM   Specimen: Urine, Random  Result Value Ref Range Status   Specimen Description   Final    URINE, RANDOM Performed at [facility], [address], [place], [place] [address]    Special Requests   Final    NONE Performed at [facility], [address] [place], [place] [address]    Culture (A)  Final    >=100,000 COLONIES/mL ESCHERICHIA COLI SUSCEPTIBILITIES TO FOLLOW Performed at [facility], [address], [place], [place] [address]    Report Status PENDING  Incomplete  Resp Panel by RT-PCR (Flu A&B, Covid) Nasopharyngeal Swab     Status: None   Collection Time: 11/25/20  5:01 PM   Specimen: Nasopharyngeal Swab; Nasopharyngeal(NP) swabs in vial transport medium  Result Value Ref Range Status   SARS Coronavirus 2 by RT PCR NEGATIVE NEGATIVE Final    Comment: (NOTE) SARS-CoV-2 target nucleic acids are NOT DETECTED.  The SARS-CoV-2 RNA is generally detectable in upper respiratory specimens during the acute phase of infection. The lowest concentration of SARS-CoV-2 viral copies this assay can detect is 138 copies/mL. A negative result does not preclude SARS-Cov-2 infection and should not be used as the sole basis for treatment or other patient management decisions. A negative result may occur with  improper specimen collection/handling, submission of specimen other than nasopharyngeal swab, presence of viral mutation(s) within the areas targeted by this assay, and inadequate number of viral copies(<138 copies/mL). A negative result must be combined with clinical observations,  patient history, and epidemiological information. The expected result is Negative.  Fact Sheet for Patients:  BloggerCourse.com  Fact Sheet for Healthcare Providers:  SeriousBroker.it  This test is no t yet approved or cleared by the [place] FDA and  has been authorized for detection and/or diagnosis of SARS-CoV-2 by FDA under an Emergency Use Authorization (EUA). This EUA will remain  in effect (meaning this test can be used) for the duration of the COVID-19 declaration under Section 564(b)(1) of the Act, 21 U.S.C.section 360bbb-3(b)(1), unless the authorization is terminated  or revoked sooner.       Influenza A by PCR NEGATIVE NEGATIVE Final   Influenza B by PCR NEGATIVE NEGATIVE Final    Comment: (NOTE) The Xpert Xpress SARS-CoV-2/FLU/RSV plus assay is intended as an aid in the diagnosis of influenza from Nasopharyngeal swab specimens and should not be used as a  sole basis for treatment. Nasal washings and aspirates are unacceptable for Xpert Xpress SARS-CoV-2/FLU/RSV testing.  Fact Sheet for Patients: BloggerCourse.com  Fact Sheet for Healthcare Providers: SeriousBroker.it  This test is not yet approved or cleared by the [place] FDA and has been authorized for detection and/or diagnosis of SARS-CoV-2 by FDA under an Emergency Use Authorization (EUA). This EUA will remain in effect (meaning this test can be used) for the duration of the COVID-19 declaration under Section 564(b)(1) of the Act, 21 U.S.C. section 360bbb-3(b)(1), unless the authorization is terminated or revoked.  Performed at [facility], [address], [place], [place] [address]   Blood culture (routine x 2)     Status: None (Preliminary result)   Collection Time: 11/25/20  6:00 PM   Specimen: BLOOD  Result Value Ref Range Status   Specimen Description   Final    BLOOD  RIGHT ANTECUBITAL Performed at [facility], [address], [place], [place] [address]    Special Requests   Final    BOTTLES DRAWN AEROBIC AND ANAEROBIC Blood Culture results may not be optimal due to an inadequate volume of blood received in culture bottles Performed at [facility], [address], [place], [place] [address]    Culture   Final    NO GROWTH 2 DAYS Performed at [facility], [address], [place], [place] [address]    Report Status PENDING  Incomplete  Blood culture (routine x 2)     Status: None (Preliminary result)   Collection Time: 11/25/20  6:00 PM   Specimen: BLOOD  Result Value Ref Range Status   Specimen Description   Final    BLOOD LEFT HAND Performed at [facility], [address] [place], [place] [address]    Special Requests   Final    BOTTLES DRAWN AEROBIC AND ANAEROBIC Blood Culture results may not be optimal due to an inadequate volume of blood received in culture bottles Performed at [facility], [address] [place], [place] [address]    Culture   Final    NO GROWTH 2 DAYS Performed at [facility], [address] [place], [place] [address]    Report Status PENDING  Incomplete     Labs: BNP (last 3 results) Recent Labs    10/11/20 1848  BNP 491.7*   Basic Metabolic Panel: Recent Labs  Lab 11/25/20 1503 11/25/20 1815 11/26/20 0543 11/27/20 0453  NA 129*  --  132* 134*  K 3.0*  --  2.6* 4.1  CL 93*  --  95* 103  CO2 22  --  24 22  GLUCOSE 166*  --  153* 144*  BUN 19  --  10 7*  CREATININE 0.67 0.56 0.38* 0.34*  CALCIUM 8.4*  --  7.9* 7.5*  MG 1.6*  --  1.7 1.6*   Liver Function Tests: Recent Labs  Lab 11/25/20 1503 11/26/20 0543 11/27/20 0453  AST ALT ALKPHOS 41 45 38  BILITOT 0.6 0.6 0.4  PROT 6.6 6.7 5.7*  ALBUMIN 3.3* 3.3* 2.8*   Recent Labs  Lab 11/25/20 1503  LIPASE 30   No results for  input(s): AMMONIA in the last 168 hours. CBC: Recent Labs  Lab 11/25/20 1503 11/25/20 1815 11/26/20 0543  WBC 7.7 8.8 8.8  NEUTROABS 5.1  --   --   HGB 11.5* 12.4 13.1  HCT 33.6*  36.4 38.4  MCV 76.5* 75.5* 75.6*  PLT 230 248 237   Cardiac Enzymes: No results for input(s): CKTOTAL, CKMB, CKMBINDEX, TROPONINI in the last 168 hours. BNP: Invalid input(s): POCBNP CBG: Recent Labs  Lab 11/26/20 1103 11/26/20 1638 11/26/20 2157 11/27/20 0800 11/27/20 1141  GLUCAP 168* 174* 99 140* 284*   D-Dimer No results for input(s): DDIMER in the last 72 hours. Hgb A1c Recent Labs    11/25/20 1815  HGBA1C 7.2*   Lipid Profile No results for input(s): CHOL, HDL, LDLCALC, TRIG, CHOLHDL, LDLDIRECT in the last 72 hours. Thyroid function studies No results for input(s): TSH, T4TOTAL, T3FREE, THYROIDAB in the last 72 hours.  Invalid input(s): FREET3 Anemia work up No results for input(s): VITAMINB12, FOLATE, FERRITIN, TIBC, IRON, RETICCTPCT in the last 72 hours. Urinalysis    Component Value Date/Time   COLORURINE YELLOW 11/25/2020 1501   APPEARANCEUR HAZY (A) 11/25/2020 1501   LABSPEC 1.011 11/25/2020 1501   PHURINE 7.0 11/25/2020 1501   GLUCOSEU NEGATIVE 11/25/2020 1501   HGBUR NEGATIVE 11/25/2020 1501   BILIRUBINUR NEGATIVE 11/25/2020 1501   KETONESUR NEGATIVE 11/25/2020 1501   PROTEINUR NEGATIVE 11/25/2020 1501   UROBILINOGEN 0.2 09/20/2013 0822   NITRITE POSITIVE (A) 11/25/2020 1501   LEUKOCYTESUR SMALL (A) 11/25/2020 1501   Sepsis Labs Invalid input(s): PROCALCITONIN,  WBC,  LACTICIDVEN Microbiology Recent Results (from the past 240 hour(s))  Urine culture     Status: Abnormal (Preliminary result)   Collection Time: 11/25/20  3:01 PM   Specimen: Urine, Random  Result Value Ref Range Status   Specimen Description   Final    URINE, RANDOM Performed at [facility], [address] [place], [place] [address]    Special Requests   Final     NONE Performed at [facility], [address] [place], [place] [address]    Culture (A)  Final    >=100,000 COLONIES/mL ESCHERICHIA COLI SUSCEPTIBILITIES TO FOLLOW Performed at [facility], [address] [place], [place] [address]    Report Status PENDING  Incomplete  Resp Panel by RT-PCR (Flu A&B, Covid) Nasopharyngeal Swab     Status: None   Collection Time: 11/25/20  5:01 PM   Specimen: Nasopharyngeal Swab; Nasopharyngeal(NP) swabs in vial transport medium  Result Value Ref Range Status   SARS Coronavirus 2 by RT PCR NEGATIVE NEGATIVE Final    Comment: (NOTE) SARS-CoV-2 target nucleic acids are NOT DETECTED.  The SARS-CoV-2 RNA is generally detectable in upper respiratory specimens during the acute phase of infection. The lowest concentration of SARS-CoV-2 viral copies this assay can detect is 138 copies/mL. A negative result does not preclude SARS-Cov-2 infection and should not be used as the sole basis for treatment or other patient management decisions. A negative result may occur with  improper specimen collection/handling, submission of specimen other than nasopharyngeal swab, presence of viral mutation(s) within the areas targeted by this assay, and inadequate number of viral copies(<138 copies/mL). A negative result must be combined with clinical observations, patient history, and epidemiological information. The expected result is Negative.  Fact Sheet for Patients:  BloggerCourse.comhttps://www.fda.gov/media/152166/download  Fact Sheet for Healthcare Providers:  SeriousBroker.ithttps://www.fda.gov/media/152162/download  This test is no t yet approved or cleared by the [place] FDA and  has been authorized for detection and/or diagnosis of SARS-CoV-2 by FDA under an Emergency Use Authorization (EUA). This EUA will remain  in effect (meaning this test can be used) for the duration of the COVID-19 declaration under  Section 564(b)(1) of the Act, 21 U.S.C.section  360bbb-3(b)(1), unless the authorization is terminated  or revoked sooner.       Influenza A by PCR NEGATIVE NEGATIVE Final   Influenza B by PCR NEGATIVE NEGATIVE Final    Comment: (NOTE) The Xpert Xpress SARS-CoV-2/FLU/RSV plus assay is intended as an aid in the diagnosis of influenza from Nasopharyngeal swab specimens and should not be used as a sole basis for treatment. Nasal washings and aspirates are unacceptable for Xpert Xpress SARS-CoV-2/FLU/RSV testing.  Fact Sheet for Patients: BloggerCourse.com  Fact Sheet for Healthcare Providers: SeriousBroker.it  This test is not yet approved or cleared by the [place] FDA and has been authorized for detection and/or diagnosis of SARS-CoV-2 by FDA under an Emergency Use Authorization (EUA). This EUA will remain in effect (meaning this test can be used) for the duration of the COVID-19 declaration under Section 564(b)(1) of the Act, 21 U.S.C. section 360bbb-3(b)(1), unless the authorization is terminated or revoked.  Performed at [facility], [address] [place], [place] [address]   Blood culture (routine x 2)     Status: None (Preliminary result)   Collection Time: 11/25/20  6:00 PM   Specimen: BLOOD  Result Value Ref Range Status   Specimen Description   Final    BLOOD RIGHT ANTECUBITAL Performed at [facility], [address], [place], [place] [address]    Special Requests   Final    BOTTLES DRAWN AEROBIC AND ANAEROBIC Blood Culture results may not be optimal due to an inadequate volume of blood received in culture bottles Performed at [facility], [address], [place], [place] [address]    Culture   Final    NO GROWTH 2 DAYS Performed at [facility], [address] [place], [place] [address]    Report Status PENDING  Incomplete  Blood culture (routine x 2)     Status: None (Preliminary  result)   Collection Time: 11/25/20  6:00 PM   Specimen: BLOOD  Result Value Ref Range Status   Specimen Description   Final    BLOOD LEFT HAND Performed at [facility], [address], [place], [place] [address]    Special Requests   Final    BOTTLES DRAWN AEROBIC AND ANAEROBIC Blood Culture results may not be optimal due to an inadequate volume of blood received in culture bottles Performed at [facility], [address], [place], [place] [address]    Culture   Final    NO GROWTH 2 DAYS Performed at [facility], [address] [place], [place] [address]    Report Status PENDING  Incomplete   Time spent:  SIGNED:   Rickey Barbara, MD  [facility] 11/27/2020, 1:15 PM  If 7PM-7AM, please contact night-coverage

## 2020-11-27 NOTE — Plan of Care (Signed)
  Problem: Education: Goal: Knowledge of General Education information will improve Description: Including pain rating scale, medication(s)/side effects and non-pharmacologic comfort measures Outcome: Completed/Met   Problem: Health Behavior/Discharge Planning: Goal: Ability to manage health-related needs will improve Outcome: Progressing   Problem: Clinical Measurements: Goal: Ability to maintain clinical measurements within normal limits will improve Outcome: Progressing Goal: Will remain free from infection Outcome: Progressing Goal: Diagnostic test results will improve Outcome: Progressing Goal: Respiratory complications will improve Outcome: Progressing Goal: Cardiovascular complication will be avoided Outcome: Progressing   Problem: Activity: Goal: Risk for activity intolerance will decrease Outcome: Progressing   Problem: Nutrition: Goal: Adequate nutrition will be maintained Outcome: Progressing   Problem: Coping: Goal: Level of anxiety will decrease Outcome: Completed/Met   Problem: Elimination: Goal: Will not experience complications related to bowel motility Outcome: Completed/Met   Problem: Pain Managment: Goal: General experience of comfort will improve Outcome: Progressing   Problem: Safety: Goal: Ability to remain free from injury will improve Outcome: Progressing   Problem: Skin Integrity: Goal: Risk for impaired skin integrity will decrease Outcome: Completed/Met   Problem: Urinary Elimination: Goal: Signs and symptoms of infection will decrease Outcome: Progressing

## 2020-11-27 NOTE — TOC Transition Note (Signed)
Transition of Care [facility]) - CM/SW Discharge Note   Patient Details  Name: Amy Hood MRN: [id number] Date of Birth: [birth year]  Transition of [facility]) CM/SW Contact:  Darleene Cleaver, LCSW Phone Number: 11/27/2020, 2:14 PM   Clinical Narrative:    CSW spoke to patient's daughter.  Patient's daughter would like home health PT, CSW asked if she had a preference for agencies, she said she does not.  CSW checked with [facility], and they are able to accept patient and will contact daughter today.   Final next level of care: Home w Home Health Services Barriers to Discharge: Barriers Resolved   Patient Goals and CMS Choice Patient states their goals for this hospitalization and ongoing recovery are:: To return back home with daughter CMS Medicare.gov Compare Post Acute Care list provided to:: Patient Represenative (must comment) Choice offered to / list presented to : Adult Children  Discharge Placement  Patient plans to discharge back home with patient's daughter.                     Discharge Plan and Services                          HH Arranged: PT HH Agency: [facility] Date [facility] Agency Contacted: 11/27/20 Time HH Agency Contacted: 1414 Representative spoke with at [facility] Agency: Cheryly  Social Determinants of Health (SDOH) Interventions     Readmission Risk Interventions No flowsheet data found.

## 2020-11-27 NOTE — Discharge Instructions (Addendum)
Continue to hold your blood pressure medications until your primary doctor instructs you to resume them

## 2020-11-27 NOTE — Plan of Care (Signed)
Problem: Education: Goal: Knowledge of General Education information will improve Description: Including pain rating scale, medication(s)/side effects and non-pharmacologic comfort measures Outcome: Completed/Met   Problem: Health Behavior/Discharge Planning: Goal: Ability to manage health-related needs will improve 11/27/2020 1333 by Annie Sable, RN Outcome: Completed/Met 11/27/2020 1054 by Annie Sable, RN Outcome: Progressing   Problem: Clinical Measurements: Goal: Ability to maintain clinical measurements within normal limits will improve 11/27/2020 1333 by Annie Sable, RN Outcome: Completed/Met 11/27/2020 1054 by Annie Sable, RN Outcome: Progressing Goal: Will remain free from infection 11/27/2020 1333 by Annie Sable, RN Outcome: Completed/Met 11/27/2020 1054 by Annie Sable, RN Outcome: Progressing Goal: Diagnostic test results will improve 11/27/2020 1333 by Annie Sable, RN Outcome: Completed/Met 11/27/2020 1054 by Annie Sable, RN Outcome: Progressing Goal: Respiratory complications will improve 11/27/2020 1333 by Annie Sable, RN Outcome: Completed/Met 11/27/2020 1054 by Annie Sable, RN Outcome: Progressing Goal: Cardiovascular complication will be avoided 11/27/2020 1333 by Annie Sable, RN Outcome: Completed/Met 11/27/2020 1054 by Annie Sable, RN Outcome: Progressing   Problem: Activity: Goal: Risk for activity intolerance will decrease 11/27/2020 1333 by Annie Sable, RN Outcome: Completed/Met 11/27/2020 1054 by Annie Sable, RN Outcome: Progressing   Problem: Nutrition: Goal: Adequate nutrition will be maintained 11/27/2020 1333 by Annie Sable, RN Outcome: Completed/Met 11/27/2020 1054 by Annie Sable, RN Outcome: Progressing   Problem: Coping: Goal: Level of anxiety will decrease Outcome: Completed/Met   Problem: Elimination: Goal: Will not experience complications related to bowel  motility Outcome: Completed/Met   Problem: Pain Managment: Goal: General experience of comfort will improve 11/27/2020 1333 by Annie Sable, RN Outcome: Completed/Met 11/27/2020 1054 by Annie Sable, RN Outcome: Progressing   Problem: Safety: Goal: Ability to remain free from injury will improve 11/27/2020 1333 by Annie Sable, RN Outcome: Completed/Met 11/27/2020 1054 by Annie Sable, RN Outcome: Progressing   Problem: Skin Integrity: Goal: Risk for impaired skin integrity will decrease Outcome: Completed/Met   Problem: Urinary Elimination: Goal: Signs and symptoms of infection will decrease 11/27/2020 1333 by Annie Sable, RN Outcome: Completed/Met 11/27/2020 1054 by Annie Sable, RN Outcome: Progressing

## 2020-11-27 NOTE — Progress Notes (Signed)
Pt discharged home today per Dr. Rhona Leavens. Pt's IV site D/C'd and WDL. Pt's VSS. Pt's daughter provided with home medication list, discharge instructions and prescriptions. Verbalized understanding. Pt's daughter provided with work note as well. Pt left floor via WC in stable condition accompanied by NT.

## 2020-11-28 LAB — URINE CULTURE: Culture: >=100000 — AB

## 2020-11-30 LAB — CULTURE, BLOOD (ROUTINE X 2)
Culture: NO GROWTH
Culture: NO GROWTH

## 2020-12-26 ENCOUNTER — Other Ambulatory Visit: Payer: Self-pay

## 2020-12-26 ENCOUNTER — Encounter (HOSPITAL_COMMUNITY): Payer: Self-pay

## 2020-12-26 ENCOUNTER — Emergency Department (HOSPITAL_COMMUNITY): Payer: Medicare Other

## 2020-12-26 ENCOUNTER — Inpatient Hospital Stay (HOSPITAL_COMMUNITY)
Admission: EM | Admit: 2020-12-26 | Discharge: 2021-01-09 | DRG: 871 | Disposition: E | Payer: Medicare Other | Attending: Internal Medicine | Admitting: Internal Medicine

## 2020-12-26 DIAGNOSIS — E119 Type 2 diabetes mellitus without complications: Secondary | ICD-10-CM

## 2020-12-26 DIAGNOSIS — U071 COVID-19: Secondary | ICD-10-CM | POA: Diagnosis present

## 2020-12-26 DIAGNOSIS — Z515 Encounter for palliative care: Secondary | ICD-10-CM | POA: Diagnosis not present

## 2020-12-26 DIAGNOSIS — J1282 Pneumonia due to coronavirus disease 2019: Secondary | ICD-10-CM | POA: Diagnosis present

## 2020-12-26 DIAGNOSIS — R64 Cachexia: Secondary | ICD-10-CM | POA: Diagnosis present

## 2020-12-26 DIAGNOSIS — I252 Old myocardial infarction: Secondary | ICD-10-CM

## 2020-12-26 DIAGNOSIS — J9600 Acute respiratory failure, unspecified whether with hypoxia or hypercapnia: Secondary | ICD-10-CM | POA: Diagnosis not present

## 2020-12-26 DIAGNOSIS — Z888 Allergy status to other drugs, medicaments and biological substances status: Secondary | ICD-10-CM | POA: Diagnosis not present

## 2020-12-26 DIAGNOSIS — F0390 Unspecified dementia without behavioral disturbance: Secondary | ICD-10-CM | POA: Diagnosis present

## 2020-12-26 DIAGNOSIS — Z79899 Other long term (current) drug therapy: Secondary | ICD-10-CM

## 2020-12-26 DIAGNOSIS — R531 Weakness: Secondary | ICD-10-CM

## 2020-12-26 DIAGNOSIS — G8929 Other chronic pain: Secondary | ICD-10-CM | POA: Diagnosis present

## 2020-12-26 DIAGNOSIS — Z833 Family history of diabetes mellitus: Secondary | ICD-10-CM

## 2020-12-26 DIAGNOSIS — R630 Anorexia: Secondary | ICD-10-CM | POA: Diagnosis present

## 2020-12-26 DIAGNOSIS — I2510 Atherosclerotic heart disease of native coronary artery without angina pectoris: Secondary | ICD-10-CM | POA: Diagnosis present

## 2020-12-26 DIAGNOSIS — Y95 Nosocomial condition: Secondary | ICD-10-CM | POA: Diagnosis present

## 2020-12-26 DIAGNOSIS — G9341 Metabolic encephalopathy: Secondary | ICD-10-CM | POA: Diagnosis not present

## 2020-12-26 DIAGNOSIS — I480 Paroxysmal atrial fibrillation: Secondary | ICD-10-CM | POA: Diagnosis present

## 2020-12-26 DIAGNOSIS — R1310 Dysphagia, unspecified: Secondary | ICD-10-CM | POA: Diagnosis present

## 2020-12-26 DIAGNOSIS — Z6823 Body mass index (BMI) 23.0-23.9, adult: Secondary | ICD-10-CM | POA: Diagnosis not present

## 2020-12-26 DIAGNOSIS — R627 Adult failure to thrive: Secondary | ICD-10-CM | POA: Diagnosis present

## 2020-12-26 DIAGNOSIS — E785 Hyperlipidemia, unspecified: Secondary | ICD-10-CM | POA: Diagnosis present

## 2020-12-26 DIAGNOSIS — A4189 Other specified sepsis: Principal | ICD-10-CM | POA: Diagnosis present

## 2020-12-26 DIAGNOSIS — J189 Pneumonia, unspecified organism: Secondary | ICD-10-CM

## 2020-12-26 DIAGNOSIS — I350 Nonrheumatic aortic (valve) stenosis: Secondary | ICD-10-CM | POA: Diagnosis present

## 2020-12-26 DIAGNOSIS — E1165 Type 2 diabetes mellitus with hyperglycemia: Secondary | ICD-10-CM | POA: Diagnosis present

## 2020-12-26 DIAGNOSIS — J9601 Acute respiratory failure with hypoxia: Secondary | ICD-10-CM | POA: Diagnosis present

## 2020-12-26 DIAGNOSIS — J45909 Unspecified asthma, uncomplicated: Secondary | ICD-10-CM | POA: Diagnosis present

## 2020-12-26 DIAGNOSIS — I10 Essential (primary) hypertension: Secondary | ICD-10-CM | POA: Diagnosis present

## 2020-12-26 DIAGNOSIS — Z66 Do not resuscitate: Secondary | ICD-10-CM | POA: Diagnosis not present

## 2020-12-26 DIAGNOSIS — Z7982 Long term (current) use of aspirin: Secondary | ICD-10-CM

## 2020-12-26 DIAGNOSIS — F0150 Vascular dementia without behavioral disturbance: Secondary | ICD-10-CM | POA: Diagnosis not present

## 2020-12-26 DIAGNOSIS — Z7984 Long term (current) use of oral hypoglycemic drugs: Secondary | ICD-10-CM

## 2020-12-26 LAB — CBC WITH DIFFERENTIAL/PLATELET
Abs Immature Granulocytes: 0.07 10*3/uL (ref 0.00–0.07)
Basophils Absolute: 0 10*3/uL (ref 0.0–0.1)
Basophils Relative: 0 %
Eosinophils Absolute: 0 10*3/uL (ref 0.0–0.5)
Eosinophils Relative: 0 %
HCT: 35.7 % — ABNORMAL LOW (ref 36.0–46.0)
Hemoglobin: 11.9 g/dL — ABNORMAL LOW (ref 12.0–15.0)
Immature Granulocytes: 1 %
Lymphocytes Relative: 13 %
Lymphs Abs: 1 10*3/uL (ref 0.7–4.0)
MCH: 26.3 pg (ref 26.0–34.0)
MCHC: 33.3 g/dL (ref 30.0–36.0)
MCV: 79 fL — ABNORMAL LOW (ref 80.0–100.0)
Monocytes Absolute: 0.4 10*3/uL (ref 0.1–1.0)
Monocytes Relative: 6 %
Neutro Abs: 6.1 10*3/uL (ref 1.7–7.7)
Neutrophils Relative %: 80 %
Platelets: 256 10*3/uL (ref 150–400)
RBC: 4.52 MIL/uL (ref 3.87–5.11)
RDW: 15.9 % — ABNORMAL HIGH (ref 11.5–15.5)
WBC: 7.6 10*3/uL (ref 4.0–10.5)
nRBC: 0 % (ref 0.0–0.2)

## 2020-12-26 LAB — POC SARS CORONAVIRUS 2 AG -  ED: SARS Coronavirus 2 Ag: POSITIVE — AB

## 2020-12-26 LAB — FERRITIN: Ferritin: 1444 ng/mL — ABNORMAL HIGH (ref 11–307)

## 2020-12-26 LAB — LACTATE DEHYDROGENASE: LDH: 201 U/L — ABNORMAL HIGH (ref 98–192)

## 2020-12-26 LAB — COMPREHENSIVE METABOLIC PANEL
ALT: 23 U/L (ref 0–44)
AST: 29 U/L (ref 15–41)
Albumin: 3.5 g/dL (ref 3.5–5.0)
Alkaline Phosphatase: 43 U/L (ref 38–126)
Anion gap: 16 — ABNORMAL HIGH (ref 5–15)
BUN: 24 mg/dL — ABNORMAL HIGH (ref 8–23)
CO2: 26 mmol/L (ref 22–32)
Calcium: 9 mg/dL (ref 8.9–10.3)
Chloride: 97 mmol/L — ABNORMAL LOW (ref 98–111)
Creatinine, Ser: 0.63 mg/dL (ref 0.44–1.00)
GFR, Estimated: >60 mL/min (ref >60)
Glucose, Bld: 135 mg/dL — ABNORMAL HIGH (ref 70–99)
Potassium: 3.1 mmol/L — ABNORMAL LOW (ref 3.5–5.1)
Sodium: 139 mmol/L (ref 135–145)
Total Bilirubin: 1.1 mg/dL (ref 0.3–1.2)
Total Protein: 7.3 g/dL (ref 6.5–8.1)

## 2020-12-26 LAB — CBG MONITORING, ED
Glucose-Capillary: 138 mg/dL — ABNORMAL HIGH (ref 70–99)
Glucose-Capillary: 173 mg/dL — ABNORMAL HIGH (ref 70–99)

## 2020-12-26 LAB — PROCALCITONIN: Procalcitonin: <0.1 ng/mL

## 2020-12-26 LAB — TROPONIN I (HIGH SENSITIVITY)
Troponin I (High Sensitivity): 33 ng/L — ABNORMAL HIGH (ref <18)
Troponin I (High Sensitivity): 28 ng/L — ABNORMAL HIGH (ref <18)

## 2020-12-26 LAB — FIBRINOGEN: Fibrinogen: 515 mg/dL — ABNORMAL HIGH (ref 210–475)

## 2020-12-26 LAB — D-DIMER, QUANTITATIVE: D-Dimer, Quant: 0.76 ug/mL-FEU — ABNORMAL HIGH (ref 0.00–0.50)

## 2020-12-26 LAB — C-REACTIVE PROTEIN: CRP: 7.1 mg/dL — ABNORMAL HIGH (ref <1.0)

## 2020-12-26 LAB — TRIGLYCERIDES: Triglycerides: 98 mg/dL (ref <150)

## 2020-12-26 LAB — LACTIC ACID, PLASMA: Lactic Acid, Venous: 1.3 mmol/L (ref 0.5–1.9)

## 2020-12-26 LAB — BRAIN NATRIURETIC PEPTIDE: B Natriuretic Peptide: 143.7 pg/mL — ABNORMAL HIGH (ref 0.0–100.0)

## 2020-12-26 MED ORDER — DEXAMETHASONE SODIUM PHOSPHATE 10 MG/ML IJ SOLN
6.0000 mg | INTRAMUSCULAR | Status: DC
  Administered 2020-12-27: 6 mg via INTRAVENOUS
  Filled 2020-12-26: qty 1

## 2020-12-26 MED ORDER — HYDROCOD POLST-CPM POLST ER 10-8 MG/5ML PO SUER
5.0000 mL | Freq: Two times a day (BID) | ORAL | Status: DC | PRN
Start: 2020-12-26 — End: 2021-01-01
  Administered 2020-12-27: 5 mL via ORAL
  Filled 2020-12-26: qty 5

## 2020-12-26 MED ORDER — DEXAMETHASONE SODIUM PHOSPHATE 10 MG/ML IJ SOLN
6.0000 mg | Freq: Once | INTRAMUSCULAR | Status: AC
  Administered 2020-12-26: 6 mg via INTRAVENOUS
  Filled 2020-12-26: qty 1

## 2020-12-26 MED ORDER — POTASSIUM CHLORIDE CRYS ER 20 MEQ PO TBCR
40.0000 meq | EXTENDED_RELEASE_TABLET | Freq: Once | ORAL | Status: AC
  Administered 2020-12-26: 40 meq via ORAL

## 2020-12-26 MED ORDER — BARICITINIB 2 MG PO TABS: 2.0000 mg | ORAL_TABLET | Freq: Every day | ORAL | Status: DC

## 2020-12-26 MED ORDER — SODIUM CHLORIDE 0.9 % IV SOLN
200.0000 mg | Freq: Once | INTRAVENOUS | Status: AC
  Administered 2020-12-26: 200 mg via INTRAVENOUS
  Filled 2020-12-26: qty 200

## 2020-12-26 MED ORDER — ENOXAPARIN SODIUM 40 MG/0.4ML ~~LOC~~ SOLN
40.0000 mg | SUBCUTANEOUS | Status: DC
  Administered 2020-12-26 – 2020-12-31 (×6): 40 mg via SUBCUTANEOUS
  Filled 2020-12-26 (×6): qty 0.4

## 2020-12-26 MED ORDER — GUAIFENESIN-DM 100-10 MG/5ML PO SYRP: 10.0000 mL | ORAL_SOLUTION | ORAL | Status: DC | PRN

## 2020-12-26 MED ORDER — BARICITINIB 2 MG PO TABS
4.0000 mg | ORAL_TABLET | Freq: Every day | ORAL | Status: DC
  Administered 2020-12-26 – 2020-12-30 (×5): 4 mg via ORAL
  Filled 2020-12-26 (×7): qty 2

## 2020-12-26 MED ORDER — LINAGLIPTIN 5 MG PO TABS
5.0000 mg | ORAL_TABLET | Freq: Every day | ORAL | Status: DC
  Administered 2020-12-26 – 2020-12-30 (×5): 5 mg via ORAL
  Filled 2020-12-26 (×7): qty 1

## 2020-12-26 MED ORDER — INSULIN ASPART 100 UNIT/ML ~~LOC~~ SOLN
0.0000 [IU] | Freq: Every day | SUBCUTANEOUS | Status: DC
  Administered 2020-12-31: 2 [IU] via SUBCUTANEOUS
  Filled 2020-12-26: qty 0.05

## 2020-12-26 MED ORDER — INSULIN ASPART 100 UNIT/ML ~~LOC~~ SOLN
0.0000 [IU] | Freq: Three times a day (TID) | SUBCUTANEOUS | Status: DC
  Administered 2020-12-27 (×2): 4 [IU] via SUBCUTANEOUS
  Administered 2020-12-28 (×3): 11 [IU] via SUBCUTANEOUS
  Administered 2020-12-29: 4 [IU] via SUBCUTANEOUS
  Administered 2020-12-29: 15 [IU] via SUBCUTANEOUS
  Administered 2020-12-29: 7 [IU] via SUBCUTANEOUS
  Administered 2020-12-30: 4 [IU] via SUBCUTANEOUS
  Administered 2020-12-30 (×2): 11 [IU] via SUBCUTANEOUS
  Administered 2020-12-31: 7 [IU] via SUBCUTANEOUS
  Administered 2021-01-01: 3 [IU] via SUBCUTANEOUS
  Filled 2020-12-26: qty 0.2

## 2020-12-26 MED ORDER — SODIUM CHLORIDE 0.9 % IV SOLN: 100.0000 mg | Freq: Every day | INTRAVENOUS | Status: DC

## 2020-12-26 MED ORDER — ALBUTEROL SULFATE HFA 108 (90 BASE) MCG/ACT IN AERS
2.0000 | INHALATION_SPRAY | Freq: Four times a day (QID) | RESPIRATORY_TRACT | Status: DC
  Administered 2020-12-26 – 2020-12-27 (×4): 2 via RESPIRATORY_TRACT
  Filled 2020-12-26 (×2): qty 6.7

## 2020-12-26 MED ORDER — SODIUM CHLORIDE 0.9 % IV SOLN: 200.0000 mg | Freq: Once | INTRAVENOUS | Status: DC

## 2020-12-26 MED ORDER — SODIUM CHLORIDE 0.9 % IV SOLN
100.0000 mg | Freq: Every day | INTRAVENOUS | Status: AC
  Administered 2020-12-27 – 2020-12-30 (×4): 100 mg via INTRAVENOUS
  Filled 2020-12-26 (×4): qty 20

## 2020-12-26 NOTE — ED Notes (Signed)
POC Covid given to MD and RN.

## 2020-12-26 NOTE — ED Provider Notes (Signed)
[facility] DEPT Provider Note   CSN: [id number] Arrival date & time: 01/08/2021  1512     History Chief Complaint  Patient presents with  . Shortness of Breath  . Covid Exposure    Amy Hood is a 80 y.o. female.  HPI      History limited by patient hard of hearing and cannot hear [place] interpreter over the phone, history obtained from granddaughter   80 year old female with history of coronary artery disease, hypertension, atrial fibrillation, hyperlipidemia, aortic stenosis, diabetes who presents with generalized weakness, decreased appetite and hypoxia found by home health nurse in the setting of her husband just returning home from the hospital yesterday after hospitalization for COVID-19.  Had routine check up with nurse today, BP was ok but O2 level was low so nurse had called ambulance to have her sent in  [place] has been the caretaker. At baseline gets up and goes to bathroom, uses walker to get to bathroom but does struggle a little bit, Grandfather or aunt gets food for her. Doesn't sit up to eat because of chronic pain.  Has not been eating as much, eating soft foods, rice, even now drinking protein shakes because, progressive over last few weeks, really over last year   Remembers who people are, but has been "in and out" per granddaughter" has not been tested for dementia, but some memory problems, bad hearing.  Doesn't know the date at baseline not watching the news, normally knows where she is and is able to communicate the needs.  Has poor hearing and can't hear interpreter over phone most likely.  Weaker recently over the last week at least No coughing, no vomiting, no diarrhea  Has not had COVID   Past Medical History:  Diagnosis Date  . Anemia   . Arthritis   . CAD (coronary artery disease) 12/28/2016   S/p NSTEMI 1/18:  LHC - oLAD, mLAD 80, pLCx 75, mRCA 25 >> PCI:  3 x 24 mm Synergy DES to mid LAD; 3.5 x 28 mm Synergy DES to  proximal LCx // Echo 1/18:  Mild LVH, EF 55-60, apical inf and apical HK, Gr 1 DD, AV mean 13, MAC, trivial MR, reduced RVSF, trivial TR, PASP 35, no eff  . Cataracts, bilateral   . Essential hypertension   . Generalized weakness   . Hard of hearing   . History of atrial fibrillation    post MI - likely related to acute medical illness therefore anticoag not started  . Hyperlipidemia   . Hyponatremia   . Mild aortic stenosis 12/30/2016   Echo 1/18:  Mild LVH, EF 55-60, apical inf and apical HK, Gr 1 DD, AV mean 13, MAC, trivial MR, reduced RVSF, trivial TR, PASP 35, no eff // Echo 09/02/2018:  Severe focal basal septal hypertrophy, EF 60-65, no RWMA, Gr 2 DD, very mild AS (mean 11), mild LAE   . Type 2 diabetes mellitus [facility])     Patient Active Problem List   Diagnosis Date Noted  . UTI (urinary tract infection) 11/25/2020  . Elevated troponin 10/12/2020  . Chest pain 10/12/2020  . Compression fracture of T7 vertebra (HCC)   . Dehydration   . Compression fracture of T6 vertebra ([place]) 11/18/2018  . Lactic acid acidosis   . CAD S/P percutaneous coronary angioplasty   . Hyperglycemia 11/12/2018  . Type 2 diabetes mellitus without complication ([place]) 46/27/0350  . Pressure injury of skin 12/31/2016  . Mild aortic stenosis 12/30/2016  .  NSTEMI (non-ST elevated myocardial infarction) ([place]) 12/28/2016  . CAD (coronary artery disease) 12/28/2016  . Prolonged QT interval 12/28/2016  . Generalized weakness 12/28/2016  . Non-English speaking patient 12/28/2016  . Thrombocytopenia ([place]) 12/28/2016  . PAF (paroxysmal atrial fibrillation) ([place]) 12/28/2016  . Hard of hearing 12/28/2016  . Essential hypertension   . Arthritis   . Compression fracture of L5 lumbar vertebra ([place]) 09/20/2013  . Acute lumbar radiculopathy 09/20/2013  . Acute encephalopathy 09/19/2013  . Suicidal behavior 09/19/2013  . Hyperlipidemia   . Cataracts, bilateral     Past Surgical History:  Procedure Laterality  Date  . CARDIAC CATHETERIZATION N/A 12/31/2016   Procedure: Left Heart Cath and Coronary Angiography;  Surgeon: Jettie Booze, MD;  Location: [facility] LAB;  Service: Cardiovascular;  Laterality: N/A;  . CARDIAC CATHETERIZATION N/A 12/31/2016   Procedure: Coronary Stent Intervention;  Surgeon: Jettie Booze, MD;  Location: [facility];  Service: Cardiovascular;  Laterality: N/A;  . IR KYPHO THORACIC WITH BONE BIOPSY  11/20/2018  . KYPHOPLASTY    . LEFT HEART CATH AND CORONARY ANGIOGRAPHY N/A 10/13/2020   Procedure: LEFT HEART CATH AND CORONARY ANGIOGRAPHY;  Surgeon: Troy Sine, MD;  Location: [facility];  Service: Cardiovascular;  Laterality: N/A;     OB History   No obstetric history on file.     Family History  Problem Relation Age of Onset  . Diabetes Mother     Social History   Tobacco Use  . Smoking status: Never Smoker  . Smokeless tobacco: Never Used  Vaping Use  . Vaping Use: Never used  Substance Use Topics  . Alcohol use: No  . Drug use: No    Home Medications Prior to Admission medications   Medication Sig Start Date End Date Taking? Authorizing Provider  alendronate (FOSAMAX) 70 MG tablet Take 70 mg by mouth once a week. 11/05/20   [provider]  aspirin 81 MG chewable tablet Chew 81 mg by mouth daily.    [provider]  cholecalciferol (VITAMIN D3) 25 MCG (1000 UT) tablet Take 1,000 Units by mouth daily.    [provider]  guaiFENesin (ROBITUSSIN) 100 MG/5ML SOLN Take 5 mLs (100 mg total) by mouth every 4 (four) hours as needed for cough or to loosen phlegm. 10/14/20   Raiford Noble Latif, DO  HYDROcodone-acetaminophen (NORCO) 7.5-325 MG tablet Take 1 tablet by mouth 3 (three) times daily as needed for pain. 10/20/20   [provider]  iron polysaccharides (NIFEREX) 150 MG capsule Take 1 capsule (150 mg total) by mouth daily. 09/21/13   Wenda Low, MD  metFORMIN (GLUCOPHAGE) 500 MG  tablet Take 500 mg by mouth daily with breakfast. with food 07/31/18   [provider]  polyethylene glycol (MIRALAX / GLYCOLAX) packet Take 17 g by mouth 2 (two) times daily. Patient taking differently: Take 17 g by mouth daily as needed for mild constipation. 11/23/18   Eugenie Filler, MD  rosuvastatin (CRESTOR) 20 MG tablet Take 1 tablet (20 mg total) by mouth daily at 6 PM. 01/02/17   Oswald Hillock, MD  senna-docusate (SENOKOT-S) 8.6-50 MG tablet Take 1 tablet by mouth at bedtime. 11/23/18   Eugenie Filler, MD    Allergies    Lisinopril  Review of Systems   Review of Systems  Constitutional: Positive for appetite change and fatigue. Negative for fever.  HENT: Negative for congestion and sore throat.   Respiratory: Negative for cough  and shortness of breath.   Cardiovascular: Negative for chest pain.  Gastrointestinal: Negative for abdominal pain, diarrhea, nausea and vomiting.  Musculoskeletal: Positive for arthralgias and myalgias.  Skin: Negative for rash.  Neurological: Negative for headaches.    Physical Exam Updated Vital Signs BP (!) 131/97 (BP Location: Right Arm)   Pulse 100   Temp 98.2 F (36.8 C) (Oral)   Resp 19   SpO2 100%   Physical Exam Vitals and nursing note reviewed.  Constitutional:      General: She is not in acute distress.    Appearance: She is well-developed and well-nourished. She is ill-appearing. She is not diaphoretic.  HENT:     Head: Normocephalic and atraumatic.  Eyes:     Extraocular Movements: EOM normal.     Conjunctiva/sclera: Conjunctivae normal.  Cardiovascular:     Rate and Rhythm: Normal rate and regular rhythm.     Pulses: Intact distal pulses.     Heart sounds: Normal heart sounds. No murmur heard. No friction rub. No gallop.   Pulmonary:     Effort: Pulmonary effort is normal. No respiratory distress.     Breath sounds: Normal breath sounds. No wheezing or rales.  Abdominal:     General: There is no  distension.     Palpations: Abdomen is soft.     Tenderness: There is no abdominal tenderness. There is no guarding.  Musculoskeletal:        General: No tenderness or edema.     Cervical back: Normal range of motion.  Skin:    General: Skin is warm and dry.     Findings: No erythema or rash.  Neurological:     General: No focal deficit present.     Mental Status: She is alert.     ED Results / Procedures / Treatments   Labs (all labs ordered are listed, but only abnormal results are displayed) Labs Reviewed  CBC WITH DIFFERENTIAL/PLATELET - Abnormal; Notable for the following components:      Result Value   Hemoglobin 11.9 (*)    HCT 35.7 (*)    MCV 79.0 (*)    RDW 15.9 (*)    All other components within normal limits  POC SARS CORONAVIRUS 2 AG -  ED - Abnormal; Notable for the following components:   SARS Coronavirus 2 Ag POSITIVE (*)    All other components within normal limits  CULTURE, BLOOD (ROUTINE X 2)  CULTURE, BLOOD (ROUTINE X 2)  LACTIC ACID, PLASMA  LACTIC ACID, PLASMA  COMPREHENSIVE METABOLIC PANEL  D-DIMER, QUANTITATIVE (NOT AT [facility])  PROCALCITONIN  LACTATE DEHYDROGENASE  FERRITIN  TRIGLYCERIDES  FIBRINOGEN  C-REACTIVE PROTEIN  BRAIN NATRIURETIC PEPTIDE  TROPONIN I (HIGH SENSITIVITY)    EKG EKG Interpretation  Date/Time:  Tuesday December 26 2020 15:54:46 EST Ventricular Rate:  101 PR Interval:    QRS Duration: 77 QT Interval:  314 QTC Calculation: 407 R Axis:   46 Text Interpretation: Sinus tachycardia Low voltage, precordial leads Borderline repolarization abnormality No significant change since last tracing Confirmed by Gareth Morgan [phone]) on 12/11/2020 4:15:25 PM   Radiology No results found.  Procedures Procedures (including critical care time)  Medications Ordered in ED Medications  remdesivir 200 mg in sodium chloride 0.9% 250 mL IVPB (has no administration in time range)    Followed by  remdesivir 100 mg in sodium chloride  0.9 % 100 mL IVPB (has no administration in time range)  dexamethasone (DECADRON) injection 6 mg (6 mg Intravenous  Given 12/12/2020 1654)    ED Course  I have reviewed the triage vital signs and the nursing notes.  Pertinent labs & imaging results that were available during my care of the patient were reviewed by me and considered in my medical decision making (see chart for details).    MDM Rules/Calculators/A&P                          80 year old female with history of coronary artery disease, hypertension, atrial fibrillation, hyperlipidemia, aortic stenosis, diabetes who presents with generalized weakness, decreased appetite and hypoxia found by home health nurse in the setting of her husband just returning home from the hospital yesterday after hospitalization for COVID-19. History is limited by acute illness, patient hard of hearing and unable to hear [place] interpreter over the phone, and history taken from granddaughter.  COVID-19 antigen test positive. Patient hypoxic to 88% on arrival with improvement on 3 L of oxygen. Suspect acute hypoxic respiratory failure secondary to COVID-19.  Given Decadron, remdesivir, and admitted to hospitalist for further care.  Final Clinical Impression(s) / ED Diagnoses Final diagnoses:  COVID-19  Acute respiratory failure with hypoxemia ([place])  Generalized weakness    Rx / DC Orders ED Discharge Orders    None       Gareth Morgan, MD 12/27/20 [phone]

## 2020-12-26 NOTE — ED Triage Notes (Signed)
Pt BIB [facility] from home. Home health rehab called 911 for O2 reading 81% on RA. Pt husband just got home from hospital admittance for COVID. Pt only speaks [place]. [facility] reports pt 98% on 4L [place] O2. Pt granddaughters phone numbers are bedside per [facility]. Hx of HTN, diabetes, CAD, A Fib.

## 2020-12-26 NOTE — ED Notes (Signed)
Per Dr. Dalene Seltzer, one set of blood cultures is acceptable. Pt is hard stick, unable to obtain second set at this time.

## 2020-12-26 NOTE — H&P (Signed)
History and Physical   Arnette Driggs ZOX:[id number] DOB: 1941/02/20 DOA: 01/06/2021  Referring MD/NP/PA: Dr. Billy Fischer  PCP: Wenda Low, MD   Outpatient Specialists: None  Patient coming from: Home  Chief Complaint: Shortness of breath and cough  HPI: Amy Hood is a 80 y.o. female with medical history significant of asthma, coronary artery disease, essential hypertension, diabetes, hyponatremia, aortic stenosis, hard of hearing who is [place] and speaks little or no English brought in secondary to progressive shortness of breath and known exposure to COVID-19.  Patient also has known atrial fibrillation hyperlipidemia among other things.  Husband apparently is also positive for COVID-19.  Her aunt has been her caretaker.  She usually goes to the bathroom with a walker and they feed her.  Family all around her.  Patient was not vaccinated against COVID-19.  She was brought in now with the shortness of breath and confusion.  Was found to be COVID-19 positive and now requiring oxygen.  Patient appears sick and unwell.  Has low potassium today.  Otherwise hemodynamically stable.  Patient being admitted to the hospital with COVID-19 infection very symptomatic.  ED Course: Temperature 98.2 blood pressure 170/97, pulse 105 respirate 22 oxygen sat 97% on 2 L.  White count 7.6.Hemoglobin 11.9 and platelets normal.  Potassium is 3.1.  Chloride 97 CO2 26.  Glucose 135 and BUN 24.  Creatinine is normal 0.63.  Gap of 16.  BNP 143 LDH 201 troponin is 33 with a delta of 5-5.  Triglyceride 98 ferritin 1444 CRP 7.1 and lactic acid 1.3.  COVID-negative screen is positive.  Chest x-ray showed linear atelectasis versus scaring mildly.  Patient being admitted with COVID 19 infection symptomatic.  Review of Systems: As per HPI otherwise 10 point review of systems negative.    Past Medical History:  Diagnosis Date  . Anemia   . Arthritis   . CAD (coronary artery disease) 12/28/2016   S/p NSTEMI 1/18:  LHC  - oLAD, mLAD 80, pLCx 75, mRCA 25 >> PCI:  3 x 24 mm Synergy DES to mid LAD; 3.5 x 28 mm Synergy DES to proximal LCx // Echo 1/18:  Mild LVH, EF 55-60, apical inf and apical HK, Gr 1 DD, AV mean 13, MAC, trivial MR, reduced RVSF, trivial TR, PASP 35, no eff  . Cataracts, bilateral   . Essential hypertension   . Generalized weakness   . Hard of hearing   . History of atrial fibrillation    post MI - likely related to acute medical illness therefore anticoag not started  . Hyperlipidemia   . Hyponatremia   . Mild aortic stenosis 12/30/2016   Echo 1/18:  Mild LVH, EF 55-60, apical inf and apical HK, Gr 1 DD, AV mean 13, MAC, trivial MR, reduced RVSF, trivial TR, PASP 35, no eff // Echo 09/02/2018:  Severe focal basal septal hypertrophy, EF 60-65, no RWMA, Gr 2 DD, very mild AS (mean 11), mild LAE   . Type 2 diabetes mellitus ([place])     Past Surgical History:  Procedure Laterality Date  . CARDIAC CATHETERIZATION N/A 12/31/2016   Procedure: Left Heart Cath and Coronary Angiography;  Surgeon: Jettie Booze, MD;  Location: [facility];  Service: Cardiovascular;  Laterality: N/A;  . CARDIAC CATHETERIZATION N/A 12/31/2016   Procedure: Coronary Stent Intervention;  Surgeon: Jettie Booze, MD;  Location: [facility];  Service: Cardiovascular;  Laterality: N/A;  . IR KYPHO THORACIC WITH BONE BIOPSY  11/20/2018  . KYPHOPLASTY    .  LEFT HEART CATH AND CORONARY ANGIOGRAPHY N/A 10/13/2020   Procedure: LEFT HEART CATH AND CORONARY ANGIOGRAPHY;  Surgeon: Troy Sine, MD;  Location: [facility];  Service: Cardiovascular;  Laterality: N/A;     reports that she has never smoked. She has never used smokeless tobacco. She reports that she does not drink alcohol and does not use drugs.  Allergies  Allergen Reactions  . Lisinopril Cough    Family History  Problem Relation Age of Onset  . Diabetes Mother      Prior to Admission medications   Medication Sig Start Date End  Date Taking? Authorizing Provider  alendronate (FOSAMAX) 70 MG tablet Take 70 mg by mouth once a week. 11/05/20   [provider]  aspirin 81 MG chewable tablet Chew 81 mg by mouth daily.    [provider]  cholecalciferol (VITAMIN D3) 25 MCG (1000 UT) tablet Take 1,000 Units by mouth daily.    [provider]  guaiFENesin (ROBITUSSIN) 100 MG/5ML SOLN Take 5 mLs (100 mg total) by mouth every 4 (four) hours as needed for cough or to loosen phlegm. 10/14/20   Raiford Noble Latif, DO  HYDROcodone-acetaminophen (NORCO) 7.5-325 MG tablet Take 1 tablet by mouth 3 (three) times daily as needed for pain. 10/20/20   [provider]  iron polysaccharides (NIFEREX) 150 MG capsule Take 1 capsule (150 mg total) by mouth daily. 09/21/13   Wenda Low, MD  metFORMIN (GLUCOPHAGE) 500 MG tablet Take 500 mg by mouth daily with breakfast. with food 07/31/18   [provider]  polyethylene glycol (MIRALAX / GLYCOLAX) packet Take 17 g by mouth 2 (two) times daily. Patient taking differently: Take 17 g by mouth daily as needed for mild constipation. 11/23/18   Eugenie Filler, MD  rosuvastatin (CRESTOR) 20 MG tablet Take 1 tablet (20 mg total) by mouth daily at 6 PM. 01/02/17   Oswald Hillock, MD  senna-docusate (SENOKOT-S) 8.6-50 MG tablet Take 1 tablet by mouth at bedtime. 11/23/18   Eugenie Filler, MD    Physical Exam: Vitals:   01/05/2021 1547 12/19/2020 1700 12/17/2020 1730 12/12/2020 1800  BP: (!) 131/97 128/85 140/89 (!) 171/97  Pulse: 100 (!) 104 (!) 103 (!) 103  Resp: 19 17 (!) 22 19  Temp: 98.2 F (36.8 C)     TempSrc: Oral     SpO2: 100% 99% 100% 100%      Constitutional: Frail, hard of hearing, no distress Vitals:   12/25/2020 1547 12/11/2020 1700 12/15/2020 1730 01/01/2021 1800  BP: (!) 131/97 128/85 140/89 (!) 171/97  Pulse: 100 (!) 104 (!) 103 (!) 103  Resp: 19 17 (!) 22 19  Temp: 98.2 F (36.8 C)     TempSrc: Oral     SpO2: 100% 99% 100% 100%    Eyes: PERRL, lids and conjunctivae normal ENMT: Mucous membranes are moist. Posterior pharynx clear of any exudate or lesions.Normal dentition.  Neck: normal, supple, no masses, no thyromegaly Respiratory: Coarse breath sounds bilaterally, no wheezing, no crackles. Normal respiratory effort. No accessory muscle use.  Cardiovascular: Sinus tachycardia, no murmurs / rubs / gallops. No extremity edema. 2+ pedal pulses. No carotid bruits.  Abdomen: no tenderness, no masses palpated. No hepatosplenomegaly. Bowel sounds positive.  Musculoskeletal: no clubbing / cyanosis. No joint deformity upper and lower extremities. Good ROM, no contractures. Normal muscle tone.  Skin: no rashes, lesions, ulcers. No induration Neurologic: CN 2-12 grossly intact. Sensation intact, DTR normal. Strength 5/5 in all  4.  Psychiatric: Confused, hard of hearing, not comprehending conversation    Labs on Admission: I have personally reviewed following labs and imaging studies  CBC: Recent Labs  Lab 12/28/2020 1542  WBC 7.6  NEUTROABS 6.1  HGB 11.9*  HCT 35.7*  MCV 79.0*  PLT 762   Basic Metabolic Panel: Recent Labs  Lab 12/13/2020 1542  NA 139  K 3.1*  CL 97*  CO2 26  GLUCOSE 135*  BUN 24*  CREATININE 0.63  CALCIUM 9.0   GFR: CrCl cannot be calculated (Unknown ideal weight.). Liver Function Tests: Recent Labs  Lab 12/17/2020 1542  AST 29  ALT 23  ALKPHOS 43  BILITOT 1.1  PROT 7.3  ALBUMIN 3.5   No results for input(s): LIPASE, AMYLASE in the last 168 hours. No results for input(s): AMMONIA in the last 168 hours. Coagulation Profile: No results for input(s): INR, PROTIME in the last 168 hours. Cardiac Enzymes: No results for input(s): CKTOTAL, CKMB, CKMBINDEX, TROPONINI in the last 168 hours. BNP (last 3 results) No results for input(s): PROBNP in the last 8760 hours. HbA1C: No results for input(s): HGBA1C in the last 72 hours. CBG: No results for input(s): GLUCAP in the last 168  hours. Lipid Profile: Recent Labs    12/24/2020 1542  TRIG 98   Thyroid Function Tests: No results for input(s): TSH, T4TOTAL, FREET4, T3FREE, THYROIDAB in the last 72 hours. Anemia Panel: Recent Labs    12/31/2020 1542  FERRITIN 1,444*   Urine analysis:    Component Value Date/Time   COLORURINE YELLOW 11/25/2020 1501   APPEARANCEUR HAZY (A) 11/25/2020 1501   LABSPEC 1.011 11/25/2020 1501   PHURINE 7.0 11/25/2020 1501   GLUCOSEU NEGATIVE 11/25/2020 1501   HGBUR NEGATIVE 11/25/2020 1501   BILIRUBINUR NEGATIVE 11/25/2020 1501   KETONESUR NEGATIVE 11/25/2020 1501   PROTEINUR NEGATIVE 11/25/2020 1501   UROBILINOGEN 0.2 09/20/2013 0822   NITRITE POSITIVE (A) 11/25/2020 1501   LEUKOCYTESUR SMALL (A) 11/25/2020 1501   Sepsis Labs: _0 (procalcitonin:4,lacticidven:4) )No results found for this or any previous visit (from the past 240 hour(s)).   Radiological Exams on Admission: DG Chest Port 1 View  Result Date: 12/23/2020 CLINICAL DATA:  Shortness of breath. EXAM: PORTABLE CHEST 1 VIEW COMPARISON:  November 25, 2020 FINDINGS: Mild linear scarring and/or atelectasis is seen within the left lung base. There is no evidence of a pleural effusion or pneumothorax. The heart size and mediastinal contours are within normal limits. A chronic sixth right rib fracture is seen. Evidence of prior vertebroplasty is noted within the midthoracic and upper lumbar spine. IMPRESSION: Mild left basilar linear scarring and/or atelectasis. Electronically Signed   By: Virgina Norfolk M.D.   On: 12/13/2020 17:19      Assessment/Plan Principal Problem:   Acute respiratory failure due to COVID-19 [facility]) Active Problems:   Essential hypertension   CAD (coronary artery disease)   PAF (paroxysmal atrial fibrillation) (HCC)   Type 2 diabetes mellitus without complication ([place])     #1 symptomatic COVID-19 infection: Patient is going to be admitted.  Initiate steroids, remdesivir, breathing  treatments and follow the COVID 19 follow-up protocols.  Checking markers.  #2 essential hypertension: Continue with home regimen.  #3 diabetes: Home regimen and sliding scale insulin.  #4 paroxysmal atrial fibrillation: We will continue with home regimen.  Patient is some mild RVR.  #5 coronary artery disease: Stable.  Monitor on telemetry  #6 hard of hearing: Use of interpreter.  Family at bedside also  DVT  prophylaxis: Lovenox Code Status: Full code Family Communication: Family at bedside Disposition Plan: To be determined Consults called: None Admission status: Inpatient  Severity of Illness: The appropriate patient status for this patient is INPATIENT. Inpatient status is judged to be reasonable and necessary in order to provide the required intensity of service to ensure the patient's safety. The patient's presenting symptoms, physical exam findings, and initial radiographic and laboratory data in the context of their chronic comorbidities is felt to place them at high risk for further clinical deterioration. Furthermore, it is not anticipated that the patient will be medically stable for discharge from the hospital within 2 midnights of admission. The following factors support the patient status of inpatient.   " The patient's presenting symptoms include shortness of breath. " The worrisome physical exam findings include coarse breath sound. " The initial radiographic and laboratory data are worrisome because of evidence of COVID infection. " The chronic co-morbidities include coronary artery disease.   * I certify that at the point of admission it is my clinical judgment that the patient will require inpatient hospital care spanning beyond 2 midnights from the point of admission due to high intensity of service, high risk for further deterioration and high frequency of surveillance required.Barbette Merino MD [facility] Pager [phone]  If 7PM-7AM, please  contact night-coverage [email or web address] Password [facility]  01/08/2021, 6:50 PM

## 2020-12-27 DIAGNOSIS — A4189 Other specified sepsis: Principal | ICD-10-CM

## 2020-12-27 DIAGNOSIS — U071 COVID-19: Secondary | ICD-10-CM | POA: Diagnosis present

## 2020-12-27 DIAGNOSIS — I10 Essential (primary) hypertension: Secondary | ICD-10-CM

## 2020-12-27 DIAGNOSIS — J9601 Acute respiratory failure with hypoxia: Secondary | ICD-10-CM | POA: Diagnosis not present

## 2020-12-27 DIAGNOSIS — E119 Type 2 diabetes mellitus without complications: Secondary | ICD-10-CM | POA: Diagnosis not present

## 2020-12-27 LAB — CBC WITH DIFFERENTIAL/PLATELET
Abs Immature Granulocytes: 0.02 10*3/uL (ref 0.00–0.07)
Basophils Absolute: 0 10*3/uL (ref 0.0–0.1)
Basophils Relative: 0 %
Eosinophils Absolute: 0 10*3/uL (ref 0.0–0.5)
Eosinophils Relative: 0 %
HCT: 35.4 % — ABNORMAL LOW (ref 36.0–46.0)
Hemoglobin: 11.6 g/dL — ABNORMAL LOW (ref 12.0–15.0)
Immature Granulocytes: 1 %
Lymphocytes Relative: 23 %
Lymphs Abs: 0.8 10*3/uL (ref 0.7–4.0)
MCH: 26.2 pg (ref 26.0–34.0)
MCHC: 32.8 g/dL (ref 30.0–36.0)
MCV: 79.9 fL — ABNORMAL LOW (ref 80.0–100.0)
Monocytes Absolute: 0.1 10*3/uL (ref 0.1–1.0)
Monocytes Relative: 2 %
Neutro Abs: 2.5 10*3/uL (ref 1.7–7.7)
Neutrophils Relative %: 74 %
Platelets: 234 10*3/uL (ref 150–400)
RBC: 4.43 MIL/uL (ref 3.87–5.11)
RDW: 16 % — ABNORMAL HIGH (ref 11.5–15.5)
WBC: 3.4 10*3/uL — ABNORMAL LOW (ref 4.0–10.5)
nRBC: 0 % (ref 0.0–0.2)

## 2020-12-27 LAB — COMPREHENSIVE METABOLIC PANEL
ALT: 22 U/L (ref 0–44)
AST: 32 U/L (ref 15–41)
Albumin: 3.3 g/dL — ABNORMAL LOW (ref 3.5–5.0)
Alkaline Phosphatase: 45 U/L (ref 38–126)
Anion gap: 17 — ABNORMAL HIGH (ref 5–15)
BUN: 28 mg/dL — ABNORMAL HIGH (ref 8–23)
CO2: 22 mmol/L (ref 22–32)
Calcium: 8.6 mg/dL — ABNORMAL LOW (ref 8.9–10.3)
Chloride: 100 mmol/L (ref 98–111)
Creatinine, Ser: 0.56 mg/dL (ref 0.44–1.00)
GFR, Estimated: >60 mL/min (ref >60)
Glucose, Bld: 167 mg/dL — ABNORMAL HIGH (ref 70–99)
Potassium: 3.5 mmol/L (ref 3.5–5.1)
Sodium: 139 mmol/L (ref 135–145)
Total Bilirubin: 0.9 mg/dL (ref 0.3–1.2)
Total Protein: 7 g/dL (ref 6.5–8.1)

## 2020-12-27 LAB — PHOSPHORUS: Phosphorus: 4.4 mg/dL (ref 2.5–4.6)

## 2020-12-27 LAB — GLUCOSE, CAPILLARY
Glucose-Capillary: 149 mg/dL — ABNORMAL HIGH (ref 70–99)
Glucose-Capillary: 157 mg/dL — ABNORMAL HIGH (ref 70–99)
Glucose-Capillary: 194 mg/dL — ABNORMAL HIGH (ref 70–99)
Glucose-Capillary: 199 mg/dL — ABNORMAL HIGH (ref 70–99)

## 2020-12-27 LAB — FERRITIN: Ferritin: 1252 ng/mL — ABNORMAL HIGH (ref 11–307)

## 2020-12-27 LAB — LACTIC ACID, PLASMA: Lactic Acid, Venous: 1.3 mmol/L (ref 0.5–1.9)

## 2020-12-27 LAB — C-REACTIVE PROTEIN: CRP: 10.1 mg/dL — ABNORMAL HIGH (ref <1.0)

## 2020-12-27 LAB — MAGNESIUM: Magnesium: 1.6 mg/dL — ABNORMAL LOW (ref 1.7–2.4)

## 2020-12-27 LAB — D-DIMER, QUANTITATIVE: D-Dimer, Quant: 0.85 ug/mL-FEU — ABNORMAL HIGH (ref 0.00–0.50)

## 2020-12-27 MED ORDER — ENSURE ENLIVE PO LIQD
237.0000 mL | Freq: Two times a day (BID) | ORAL | Status: DC
  Administered 2020-12-27 – 2020-12-30 (×7): 237 mL via ORAL

## 2020-12-27 MED ORDER — IPRATROPIUM-ALBUTEROL 20-100 MCG/ACT IN AERS
1.0000 | INHALATION_SPRAY | Freq: Four times a day (QID) | RESPIRATORY_TRACT | Status: DC
  Administered 2020-12-28 – 2021-01-01 (×12): 1 via RESPIRATORY_TRACT
  Filled 2020-12-27 (×2): qty 4

## 2020-12-27 MED ORDER — METHYLPREDNISOLONE SODIUM SUCC 125 MG IJ SOLR
60.0000 mg | Freq: Two times a day (BID) | INTRAMUSCULAR | Status: DC
  Administered 2020-12-27 – 2021-01-01 (×10): 60 mg via INTRAVENOUS
  Filled 2020-12-27 (×10): qty 2

## 2020-12-27 MED ORDER — ZINC SULFATE 220 (50 ZN) MG PO CAPS
220.0000 mg | ORAL_CAPSULE | Freq: Every day | ORAL | Status: DC
  Administered 2020-12-27 – 2020-12-30 (×4): 220 mg via ORAL
  Filled 2020-12-27 (×5): qty 1

## 2020-12-27 MED ORDER — LIP MEDEX EX OINT
TOPICAL_OINTMENT | CUTANEOUS | Status: DC | PRN
  Filled 2020-12-27: qty 7

## 2020-12-27 MED ORDER — MAGNESIUM SULFATE 50 % IJ SOLN
3.0000 g | Freq: Once | INTRAVENOUS | Status: AC
  Administered 2020-12-27: 3 g via INTRAVENOUS
  Filled 2020-12-27: qty 6

## 2020-12-27 MED ORDER — ASCORBIC ACID 500 MG PO TABS
500.0000 mg | ORAL_TABLET | Freq: Every day | ORAL | Status: DC
  Administered 2020-12-27 – 2020-12-30 (×4): 500 mg via ORAL
  Filled 2020-12-27 (×5): qty 1

## 2020-12-27 MED ORDER — SODIUM CHLORIDE 0.9 % IV BOLUS
500.0000 mL | Freq: Once | INTRAVENOUS | Status: AC
  Administered 2020-12-27: 500 mL via INTRAVENOUS

## 2020-12-27 MED ORDER — ADULT MULTIVITAMIN W/MINERALS CH
1.0000 | ORAL_TABLET | Freq: Every day | ORAL | Status: DC
  Administered 2020-12-27 – 2020-12-30 (×4): 1 via ORAL
  Filled 2020-12-27 (×5): qty 1

## 2020-12-27 NOTE — Progress Notes (Signed)
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  Patient also has known atrial fibrillation hyperlipidemia among other things.  Husband apparently is also positive for COVID-19.  Her aunt has been her caretaker.  She usually goes to the bathroom with a walker and they feed her.  Family all around her.  Patient was not vaccinated against COVID-19.  She was brought in now with the shortness of breath and confusion.  Was found to be COVID-19 positive and now requiring oxygen.  Patient appears sick and unwell.  Has low potassium today.  Otherwise hemodynamically stable.  Patient being admitted to the hospital with COVID-19 infection very symptomatic.  ED Course: Temperature 98.2 blood pressure 170/97, pulse 105 respirate 22 oxygen sat 97% on 2 L.  White count 7.6.Hemoglobin 11.9 and platelets normal.  Potassium is 3.1.  Chloride 97 CO2 26.  Glucose 135 and BUN 24.  Creatinine is normal 0.63.  Gap of 16.  BNP 143 LDH 201 troponin is 33 with a delta of 5-5.  Triglyceride 98 ferritin 1444 CRP 7.1 and lactic acid 1.3.  COVID-negative screen is positive.  Chest x-ray showed linear atelectasis versus scaring mildly.  Patient being admitted with COVID 19 infection symptomatic.   Subjective: A/O x4, afebrile overnight   Assessment & Plan: Covid vaccination; unvaccinated   Principal Problem:   Acute respiratory failure due to COVID-19 [facility](HCC) Active Problems:   Essential hypertension   CAD (coronary artery disease)   PAF (paroxysmal atrial fibrillation) (HCC)   Type 2 diabetes mellitus without complication (HCC)   Sepsis due to COVID-19 (HCC)  Sepsis  due to COVID  - On admission patient meets criteria for sepsis WBC<4, RR> 20, site of infection lungs,   Acute respiratory failure with hypoxia/COVID pneumonia COVID-19 Labs  Recent Labs    12/19/2020 1542 12/27/20 0545 12/27/20 0554  DDIMER 0.76*  --  0.85*  FERRITIN 1,444* 1,252*  --   LDH 201*  --   --   CRP 7.1* 10.1*  --    1/18 POC SARS coronavirus positive   - Baricitinib per pharmacy protocol -Remdesivir per pharmacy protocol - Solu-Medrol 60 mg BID - Vitamins per COVID protocol -Combivent QID -Flutter valve - Incentive spirometry -Continuous pulse ox; telemetry - Prone patient 16 hours/day, if patient cannot tolerate prone 2 to 3 hours per shift   Hypomagnesmia - Magnesium goal> 2 - 1/19 magnesium IV 3 g  Hyperglycemia - Hemoglobin A1c pending -Lipid panel pending - Resistant SSI - Tradjenta 5 mg daily    DVT prophylaxis: Lovenox Code Status: Full Family Communication:  Status is: Inpatient    Dispo: The patient is from: Home              Anticipated d/c is to: Home              Anticipated d/c date is: 1/22              Patient currently unstable      Consultants:    Procedures/Significant Events:    I have personally reviewed and interpreted all radiology studies and my findings are as above.  VENTILATOR SETTINGS: Nasal cannula 1/19 Flow 3 L/min SPO2 88%   Cultures 1/18 POC SARS coronavirus positive   Antimicrobials: Anti-infectives (From admission, onward)   Start     Dose/Rate Route Frequency Ordered Stop   12/27/20 1000  remdesivir 100 mg in sodium chloride 0.9 % 100 mL IVPB       "Followed by" Linked Group Details   100 mg 200 mL/hr over 30 Minutes Intravenous Daily 01/01/2021 1654 12/31/20 0959   12/27/20 1000  remdesivir 100 mg in sodium chloride 0.9 % 100 mL IVPB  Status:  Discontinued       "Followed by" Linked Group Details   100 mg 200 mL/hr over 30 Minutes Intravenous Daily 12/14/2020 1903 12/31/2020 1907   01/07/2021  1900  remdesivir 200 mg in sodium chloride 0.9% 250 mL IVPB  Status:  Discontinued       "Followed by" Linked Group Details   200 mg 580 mL/hr over 30 Minutes Intravenous Once 01/01/2021 1903 12/19/2020 1907   01/08/2021 1800  remdesivir 200 mg in sodium chloride 0.9% 250 mL IVPB       "Followed by" Linked Group Details   200 mg 580 mL/hr over 30 Minutes Intravenous Once 12/25/2020 1654 12/11/2020 1845       Devices    LINES / TUBES:      Continuous Infusions: . remdesivir 100 mg in NS 100 mL 100 mg (12/27/20 1319)     Objective: Vitals:   12/27/20 1716 12/27/20 2000 12/27/20 2032 12/27/20 2045  BP: 102/67  96/60 (!) 76/63  Pulse: 100  (!) 103 (!) 105  Resp: 18 (!) 26 20 16   Temp: (!) 97.4 F (36.3 C)  97.6 F (36.4 C)   TempSrc: Oral  Oral   SpO2: (!) 88%  95% 93%    Intake/Output Summary (Last 24 hours) at 12/27/2020 2105 Last data filed at 12/27/2020 1800 Gross per 24 hour  Intake 100.41 ml  Output --  Net 100.41 ml   There were no vitals filed for this visit.  Examination:  General: A/O x4, positive acute respiratory distress Eyes: negative scleral hemorrhage, negative anisocoria, negative icterus ENT: Negative Runny nose, negative gingival bleeding, Neck:  Negative scars, masses, torticollis, lymphadenopathy, JVD Lungs: decreased breath sounds bilaterally without wheezes or crackles Cardiovascular: Regular rate and rhythm without murmur gallop or rub normal S1 and S2 Abdomen: negative abdominal pain, nondistended, positive soft, bowel sounds, no rebound, no ascites, no appreciable mass Extremities: No significant cyanosis, clubbing, or edema bilateral lower extremities Skin: Negative rashes, lesions, ulcers Psychiatric:  Negative depression, negative anxiety, negative fatigue, negative mania  Central nervous system:  Cranial nerves II through XII intact, tongue/uvula midline, all extremities muscle strength 5/5, sensation intact throughout, negative dysarthria,  negative expressive aphasia, negative receptive aphasia.  .     Data Reviewed: Care during the described time interval was provided by me .  I have reviewed this patient's available data, including medical history, events of note, physical examination, and all test results as part of my evaluation.  CBC: Recent Labs  Lab 01/01/2021 1542 12/27/20 0554  WBC 7.6 3.4*  NEUTROABS 6.1 2.5  HGB 11.9* 11.6*  HCT 35.7* 35.4*  MCV 79.0* 79.9*  PLT 256 234   Basic Metabolic Panel: Recent Labs  Lab 12/11/2020 1542 12/27/20 0554  NA 139 139  K 3.1* 3.5  CL 97* 100  CO2 26 22  GLUCOSE 135* 167*  BUN 24* 28*  CREATININE 0.63 0.56  CALCIUM 9.0 8.6*  MG  --  1.6*  PHOS  --  4.4   GFR: CrCl cannot be calculated (Unknown ideal weight.). Liver Function Tests: Recent Labs  Lab 07-Jan-2021 1542 12/27/20 0554  AST 29 32  ALT 23 22  ALKPHOS 43 45  BILITOT 1.1 0.9  PROT 7.3 7.0  ALBUMIN 3.5 3.3*   No results for input(s): LIPASE, AMYLASE in the last 168 hours. No results for input(s): AMMONIA in the last 168 hours. Coagulation Profile: No results for input(s): INR, PROTIME in the last 168 hours. Cardiac Enzymes: No results for input(s): CKTOTAL, CKMB, CKMBINDEX, TROPONINI in the last 168 hours. BNP (last 3 results) No results for input(s): PROBNP in the last 8760 hours. HbA1C: No results for input(s): HGBA1C in the last 72 hours. CBG: Recent Labs  Lab 01/07/21 2344 12/27/20 0851 12/27/20 1210 12/27/20 1713 12/27/20 2051  GLUCAP 138* 149* 157* 194* 199*   Lipid Profile: Recent Labs    01/07/2021 1542  TRIG 98   Thyroid Function Tests: No results for input(s): TSH, T4TOTAL, FREET4, T3FREE, THYROIDAB in the last 72 hours. Anemia Panel: Recent Labs    01-07-2021 1542 12/27/20 0545  FERRITIN 1,444* 1,252*   Sepsis Labs: Recent Labs  Lab 01-07-2021 1542 12/27/20 0836  PROCALCITON <0.10  --   LATICACIDVEN 1.3 1.3    Recent Results (from the past 240 hour(s))  Blood  Culture (routine x 2)     Status: None (Preliminary result)   Collection Time: 01-07-21  4:58 PM   Specimen: BLOOD  Result Value Ref Range Status   Specimen Description   Final    BLOOD LEFT ANTECUBITAL Performed at [facility], [address], [place], [place] [address]    Special Requests   Final    BOTTLES DRAWN AEROBIC AND ANAEROBIC Blood Culture adequate volume Performed at [facility], [address], [place], [place] [address]    Culture   Final    NO GROWTH < 12 HOURS Performed at [facility], [address], [place], [place] [address]    Report Status PENDING  Incomplete         Radiology Studies: DG Chest Port 1 View  Result Date: 01-07-2021 CLINICAL DATA:  Shortness of breath. EXAM: PORTABLE CHEST 1 VIEW COMPARISON:  November 25, 2020 FINDINGS: Mild linear scarring and/or atelectasis is seen within the left lung base. There is no evidence of a pleural effusion or pneumothorax. The heart size and mediastinal contours are within normal limits. A chronic sixth right rib fracture is seen. Evidence of prior vertebroplasty is noted within the midthoracic and upper lumbar spine. IMPRESSION: Mild left basilar linear scarring and/or atelectasis. Electronically Signed   By: Aram Candela M.D.   On: 01/07/21 17:19        Scheduled Meds: . albuterol  2 puff Inhalation Q6H  . baricitinib  4 mg Oral Daily  . dexamethasone (DECADRON) injection  6 mg Intravenous Q24H  . enoxaparin (LOVENOX) injection  40 mg Subcutaneous Q24H  . feeding supplement  237 mL Oral BID BM  . insulin aspart  0-20 Units Subcutaneous TID WC  . insulin aspart  0-5 Units Subcutaneous QHS  . linagliptin  5 mg Oral Daily  . multivitamin with minerals  1 tablet Oral Daily   Continuous Infusions: . remdesivir 100 mg in NS 100 mL 100 mg (12/27/20 1319)     LOS: 1 day    Time spent:40 min    Shakari Qazi, Roselind Messier, MD  [facility] Pager  [phone]  If 7PM-7AM, please contact night-coverage [email or web address] Password [facility] 12/27/2020, 9:05 PM

## 2020-12-27 NOTE — Progress Notes (Signed)
Initial Nutrition Assessment  INTERVENTION:   -Recommend weighing patient for admission  -Ensure Enlive po BID, each supplement provides 350 kcal and 20 grams of protein  -Multivitamin with minerals daily  NUTRITION DIAGNOSIS:   Increased nutrient needs related to acute illness (COVID-19 infection) as evidenced by estimated needs.  GOAL:   Patient will meet greater than or equal to 90% of their needs  MONITOR:   PO intake,Supplement acceptance,Labs,Weight trends,I & O's  REASON FOR ASSESSMENT:   Malnutrition Screening Tool    ASSESSMENT:   80 y.o. female with medical history significant of asthma, coronary artery disease, essential hypertension, diabetes, hyponatremia, aortic stenosis, hard of hearing who is [place] and speaks little or no English brought in secondary to progressive shortness of breath and known exposure to COVID-19.  Unable to gather nutrition history from patient non-english speaking and HOH where she cannot hear interpreter.  Per chart review, pt was found to be COVID-19+ at admission. Pt has not eaten well PTA with decreased appetite. Mainly subsisting on soft foods such as rice and drinking protein drinks. No other nutrition impact symptoms reported.  Pt with no weight for this admission. Last weight in records is from 11/26/20 (127 lbs).   Medications: IV Mg sulfate   Labs reviewed: CBGs: 138-149  Low Mg  NUTRITION - FOCUSED PHYSICAL EXAM:  Unable to complete  Diet Order:   Diet Order            Diet heart healthy/carb modified Room service appropriate? Yes; Fluid consistency: Thin  Diet effective now                 EDUCATION NEEDS:   No education needs have been identified at this time  Skin:  Skin Assessment: Reviewed RN Assessment  Last BM:  PTA  Height:   Ht Readings from Last 1 Encounters:  11/25/20 5' (1.524 m)    Weight:   Wt Readings from Last 1 Encounters:  11/26/20 58 kg   BMI:  There is no height or  weight on file to calculate BMI.  Estimated Nutritional Needs:   Kcal:  1500-1700  Protein:  70-85g  Fluid:  1.7L/day  Tilda Franco, MS, RD, LDN Inpatient Clinical Dietitian Contact information available via Amion

## 2020-12-28 DIAGNOSIS — R630 Anorexia: Secondary | ICD-10-CM

## 2020-12-28 DIAGNOSIS — E119 Type 2 diabetes mellitus without complications: Secondary | ICD-10-CM | POA: Diagnosis not present

## 2020-12-28 DIAGNOSIS — F0150 Vascular dementia without behavioral disturbance: Secondary | ICD-10-CM

## 2020-12-28 DIAGNOSIS — J9601 Acute respiratory failure with hypoxia: Secondary | ICD-10-CM | POA: Diagnosis not present

## 2020-12-28 DIAGNOSIS — U071 COVID-19: Secondary | ICD-10-CM | POA: Diagnosis not present

## 2020-12-28 DIAGNOSIS — I10 Essential (primary) hypertension: Secondary | ICD-10-CM | POA: Diagnosis not present

## 2020-12-28 LAB — CBC WITH DIFFERENTIAL/PLATELET
Abs Immature Granulocytes: 0.01 10*3/uL (ref 0.00–0.07)
Basophils Absolute: 0 10*3/uL (ref 0.0–0.1)
Basophils Relative: 0 %
Eosinophils Absolute: 0 10*3/uL (ref 0.0–0.5)
Eosinophils Relative: 0 %
HCT: 32.1 % — ABNORMAL LOW (ref 36.0–46.0)
Hemoglobin: 10.7 g/dL — ABNORMAL LOW (ref 12.0–15.0)
Immature Granulocytes: 0 %
Lymphocytes Relative: 34 %
Lymphs Abs: 1.1 10*3/uL (ref 0.7–4.0)
MCH: 26.4 pg (ref 26.0–34.0)
MCHC: 33.3 g/dL (ref 30.0–36.0)
MCV: 79.1 fL — ABNORMAL LOW (ref 80.0–100.0)
Monocytes Absolute: 0.1 10*3/uL (ref 0.1–1.0)
Monocytes Relative: 2 %
Neutro Abs: 2.1 10*3/uL (ref 1.7–7.7)
Neutrophils Relative %: 64 %
Platelets: 263 10*3/uL (ref 150–400)
RBC: 4.06 MIL/uL (ref 3.87–5.11)
RDW: 15.9 % — ABNORMAL HIGH (ref 11.5–15.5)
WBC: 3.3 10*3/uL — ABNORMAL LOW (ref 4.0–10.5)
nRBC: 0 % (ref 0.0–0.2)

## 2020-12-28 LAB — D-DIMER, QUANTITATIVE: D-Dimer, Quant: 0.7 ug/mL-FEU — ABNORMAL HIGH (ref 0.00–0.50)

## 2020-12-28 LAB — COMPREHENSIVE METABOLIC PANEL
ALT: 25 U/L (ref 0–44)
AST: 37 U/L (ref 15–41)
Albumin: 2.9 g/dL — ABNORMAL LOW (ref 3.5–5.0)
Alkaline Phosphatase: 42 U/L (ref 38–126)
Anion gap: 11 (ref 5–15)
BUN: 46 mg/dL — ABNORMAL HIGH (ref 8–23)
CO2: 26 mmol/L (ref 22–32)
Calcium: 8.7 mg/dL — ABNORMAL LOW (ref 8.9–10.3)
Chloride: 97 mmol/L — ABNORMAL LOW (ref 98–111)
Creatinine, Ser: 0.63 mg/dL (ref 0.44–1.00)
GFR, Estimated: >60 mL/min (ref >60)
Glucose, Bld: 300 mg/dL — ABNORMAL HIGH (ref 70–99)
Potassium: 3.7 mmol/L (ref 3.5–5.1)
Sodium: 134 mmol/L — ABNORMAL LOW (ref 135–145)
Total Bilirubin: 0.6 mg/dL (ref 0.3–1.2)
Total Protein: 6.2 g/dL — ABNORMAL LOW (ref 6.5–8.1)

## 2020-12-28 LAB — C-REACTIVE PROTEIN: CRP: 7.2 mg/dL — ABNORMAL HIGH (ref <1.0)

## 2020-12-28 LAB — FERRITIN: Ferritin: 1389 ng/mL — ABNORMAL HIGH (ref 11–307)

## 2020-12-28 LAB — LIPID PANEL
Cholesterol: 105 mg/dL (ref 0–200)
HDL: 53 mg/dL (ref >40)
LDL Cholesterol: 43 mg/dL (ref 0–99)
Total CHOL/HDL Ratio: 2 RATIO
Triglycerides: 44 mg/dL (ref <150)
VLDL: 9 mg/dL (ref 0–40)

## 2020-12-28 LAB — GLUCOSE, CAPILLARY
Glucose-Capillary: 287 mg/dL — ABNORMAL HIGH (ref 70–99)
Glucose-Capillary: 280 mg/dL — ABNORMAL HIGH (ref 70–99)
Glucose-Capillary: 268 mg/dL — ABNORMAL HIGH (ref 70–99)
Glucose-Capillary: 142 mg/dL — ABNORMAL HIGH (ref 70–99)

## 2020-12-28 LAB — HEMOGLOBIN A1C
Hgb A1c MFr Bld: 7 % — ABNORMAL HIGH (ref 4.8–5.6)
Mean Plasma Glucose: 154.2 mg/dL

## 2020-12-28 LAB — MAGNESIUM: Magnesium: 2.3 mg/dL (ref 1.7–2.4)

## 2020-12-28 LAB — PHOSPHORUS: Phosphorus: 2.4 mg/dL — ABNORMAL LOW (ref 2.5–4.6)

## 2020-12-28 LAB — LACTATE DEHYDROGENASE: LDH: 181 U/L (ref 98–192)

## 2020-12-28 MED ORDER — INSULIN GLARGINE 100 UNIT/ML ~~LOC~~ SOLN
5.0000 [IU] | Freq: Every day | SUBCUTANEOUS | Status: DC
  Administered 2020-12-28 – 2021-01-01 (×5): 5 [IU] via SUBCUTANEOUS
  Filled 2020-12-28 (×5): qty 0.05

## 2020-12-28 MED ORDER — DRONABINOL 2.5 MG PO CAPS
2.5000 mg | ORAL_CAPSULE | Freq: Two times a day (BID) | ORAL | Status: DC
  Administered 2020-12-29 (×2): 2.5 mg via ORAL
  Filled 2020-12-28 (×3): qty 1

## 2020-12-28 NOTE — Progress Notes (Signed)
Inpatient Diabetes Program Recommendations  AACE/ADA: New Consensus Statement on Inpatient Glycemic Control (2015)  Target Ranges:  Prepandial:   less than 140 mg/dL      Peak postprandial:   less than 180 mg/dL (1-2 hours)      Critically ill patients:  140 - 180 mg/dL   Results for ALBIRDA, SHIEL (MRN [id number]) as of 12/28/2020 11:32  Ref. Range 12/27/2020 08:51 12/27/2020 12:10 12/27/2020 17:13 12/27/2020 20:51  Glucose-Capillary Latest Ref Range: 70 - 99 mg/dL 267 (H) 124 (H) 580 (H) 199 (H)   Results for MAYIA, MEGILL (MRN [id number]) as of 12/28/2020 11:32  Ref. Range 12/28/2020 08:22  Glucose-Capillary Latest Ref Range: 70 - 99 mg/dL 539 (H)    Admit COVID+  History: DM2   Home DM Meds:  Metformin 500 mg BID   Current Orders: Novolog 0-20 units ac/hs      Tradjenta 5 mg Daily    MD- Note Solumedrol 60 mg BID started last PM.  CBG 287 this AM.   Please consider adding Levemir while patient getting Steroids:  Levemir 8 units Daily to start (0.15 units/kg)    --Will follow patient during hospitalization--  Ambrose Finland RN, MSN, CDE Diabetes Coordinator Inpatient Glycemic Control Team Team Pager: [phone] (8a-5p)

## 2020-12-28 NOTE — Progress Notes (Signed)
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  Patient also has known atrial fibrillation hyperlipidemia among other things.  Husband apparently is also positive for COVID-19.  Her aunt has been her caretaker.  She usually goes to the bathroom with a walker and they feed her.  Family all around her.  Patient was not vaccinated against COVID-19.  She was brought in now with the shortness of breath and confusion.  Was found to be COVID-19 positive and now requiring oxygen.  Patient appears sick and unwell.  Has low potassium today.  Otherwise hemodynamically stable.  Patient being admitted to the hospital with COVID-19 infection very symptomatic.  ED Course: Temperature 98.2 blood pressure 170/97, pulse 105 respirate 22 oxygen sat 97% on 2 L.  White count 7.6.Hemoglobin 11.9 and platelets normal.  Potassium is 3.1.  Chloride 97 CO2 26.  Glucose 135 and BUN 24.  Creatinine is normal 0.63.  Gap of 16.  BNP 143 LDH 201 troponin is 33 with a delta of 5-5.  Triglyceride 98 ferritin 1444 CRP 7.1 and lactic acid 1.3.  COVID-negative screen is positive.  Chest x-ray showed linear atelectasis versus scaring mildly.  Patient being admitted with COVID 19 infection symptomatic.   Subjective: 1/20   afebrile overnight A/O x1 (does not know where, when, why).  Per daughter patient has some dementia.  Per daughter at home patient has not been eating well, basically drinking fluids.   Assessment & Plan: Covid vaccination; unvaccinated   Principal Problem:   Acute respiratory failure due to COVID-19 [facility]) Active Problems:   Essential hypertension   CAD  (coronary artery disease)   PAF (paroxysmal atrial fibrillation) (HCC)   Type 2 diabetes mellitus without complication (HCC)   Sepsis due to COVID-19 (HCC)  Sepsis due to COVID  - On admission patient meets criteria for sepsis WBC<4, RR> 20, site of infection lungs,   Acute respiratory failure with hypoxia/COVID pneumonia COVID-19 Labs  Recent Labs    12-28-2020 1542 12/27/20 0545 12/27/20 0554 12/28/20 0457  DDIMER 0.76*  --  0.85* 0.70*  FERRITIN 1,444* 1,252*  --  1,389*  LDH 201*  --   --  181  CRP 7.1* 10.1*  --  7.2*   1/18 POC SARS coronavirus positive  - Baricitinib per pharmacy protocol -Remdesivir per pharmacy protocol - Solu-Medrol 60 mg BID - Vitamins per COVID protocol -Combivent QID -Flutter valve - Incentive spirometry -Continuous pulse ox; telemetry - Prone patient 16 hours/day, if patient cannot tolerate prone 2 to 3 hours per shift  Dementia - Per daughter patient has some baseline dementia, and was being worked up for it.  Anorexia - Per daughter at home patient has not been eating very well prior to hospitalization. - 1/20 D5- 0.9% saline 80ml/hr - Marinol 2.5 mg BID  Hypomagnesmia - Magnesium goal> 2 - 1/19 magnesium IV 3 g  DM type II controlled with Hyperglycemia - 1/20 Hemoglobin A1c = 7.0  - 1/20 Lantus 5 units daily - Resistant SSI - Tradjenta 5 mg daily -Lipid panel pending   DVT prophylaxis: Lovenox Code Status: Full Family Communication:  Status is:  Inpatient    Dispo: The patient is from: Home              Anticipated d/c is to: Home              Anticipated d/c date is: 1/22              Patient currently unstable      Consultants:    Procedures/Significant Events:    I have personally reviewed and interpreted all radiology studies and my findings are as above.  VENTILATOR SETTINGS: Nasal cannula 1/20 Flow 3 L/min SPO2 94%   Cultures 1/18 POC SARS coronavirus  positive   Antimicrobials: Anti-infectives (From admission, onward)   Start     Ordered Stop   12/27/20 1000  remdesivir 100 mg in sodium chloride 0.9 % 100 mL IVPB       "Followed by" Linked Group Details   12/21/2020 1654 12/31/20 0959   12/27/20 1000  remdesivir 100 mg in sodium chloride 0.9 % 100 mL IVPB  Status:  Discontinued       "Followed by" Linked Group Details   12/27/2020 1903 12/09/2020 1907   01/06/2021 1900  remdesivir 200 mg in sodium chloride 0.9% 250 mL IVPB  Status:  Discontinued       "Followed by" Linked Group Details   12/13/2020 1903 12/20/2020 1907   12/12/2020 1800  remdesivir 200 mg in sodium chloride 0.9% 250 mL IVPB       "Followed by" Linked Group Details   12/25/2020 1654 12/31/2020 1845       Devices    LINES / TUBES:      Continuous Infusions: . remdesivir 100 mg in NS 100 mL 100 mg (12/28/20 0940)     Objective: Vitals:   12/28/20 0456 12/28/20 0542 12/28/20 0946 12/28/20 1341  BP: 91/65 91/65 107/71 90/65  Pulse:  100 85 94  Resp:  (!) 22 19 (!) 23  Temp: 98.9 F (37.2 C) 98.9 F (37.2 C) 97.8 F (36.6 C) 97.7 F (36.5 C)  TempSrc: Oral Oral Oral Oral  SpO2: 94%  96% 94%  Weight: 54.9 kg 54.9 kg    Height:  5' (1.524 m)      Intake/Output Summary (Last 24 hours) at 12/28/2020 1358 Last data filed at 12/28/2020 0300 Gross per 24 hour  Intake 600.41 ml  Output -  Net 600.41 ml   Filed Weights   12/28/20 0456 12/28/20 0542  Weight: 54.9 kg 54.9 kg    Examination:  General: A/O x1 (does not know where, when, why), not as interactive today, positive acute respiratory distress Eyes: negative scleral hemorrhage, negative anisocoria, negative icterus ENT: Negative Runny nose, negative gingival bleeding, Neck:  Negative scars, masses, torticollis, lymphadenopathy, JVD Lungs: decreased breath sounds bilaterally without wheezes or crackles Cardiovascular: Regular rate and rhythm without murmur gallop or rub normal S1 and S2 Abdomen: negative  abdominal pain, nondistended, positive soft, bowel sounds, no rebound, no ascites, no appreciable mass Extremities: No significant cyanosis, clubbing, or edema bilateral lower extremities Skin: Negative rashes, lesions, ulcers Psychiatric:  Negative depression, negative anxiety, negative fatigue, negative mania  Central nervous system:  Cranial nerves II through XII intact, tongue/uvula midline, all extremities muscle strength 5/5, sensation intact throughout, negative dysarthria, negative expressive aphasia, negative receptive aphasia.  .     Data Reviewed: Care during the described time interval was provided by me .  I have reviewed this patient's available data, including medical history, events of note, physical  examination, and all test results as part of my evaluation.  CBC: Recent Labs  Lab 2021/01/11 1542 12/27/20 0554 12/28/20 0457  WBC 7.6 3.4* 3.3*  NEUTROABS 6.1 2.5 2.1  HGB 11.9* 11.6* 10.7*  HCT 35.7* 35.4* 32.1*  MCV 79.0* 79.9* 79.1*  PLT 256 234 263   Basic Metabolic Panel: Recent Labs  Lab 01/11/21 1542 12/27/20 0554 12/28/20 0457  NA 139 139 134*  K 3.1* 3.5 3.7  CL 97* 100 97*  CO2 26 22 26   GLUCOSE 135* 167* 300*  BUN 24* 28* 46*  CREATININE 0.63 0.56 0.63  CALCIUM 9.0 8.6* 8.7*  MG  --  1.6* 2.3  PHOS  --  4.4 2.4*   GFR: Estimated Creatinine Clearance: 42.9 mL/min (by C-G formula based on SCr of 0.63 mg/dL). Liver Function Tests: Recent Labs  Lab 01/11/2021 1542 12/27/20 0554 12/28/20 0457  AST 29 32 37  ALT 23 22 25   ALKPHOS 43 45 42  BILITOT 1.1 0.9 0.6  PROT 7.3 7.0 6.2*  ALBUMIN 3.5 3.3* 2.9*   No results for input(s): LIPASE, AMYLASE in the last 168 hours. No results for input(s): AMMONIA in the last 168 hours. Coagulation Profile: No results for input(s): INR, PROTIME in the last 168 hours. Cardiac Enzymes: No results for input(s): CKTOTAL, CKMB, CKMBINDEX, TROPONINI in the last 168 hours. BNP (last 3 results) No results for  input(s): PROBNP in the last 8760 hours. HbA1C: Recent Labs    12/28/20 0457  HGBA1C 7.0*   CBG: Recent Labs  Lab 12/27/20 1210 12/27/20 1713 12/27/20 2051 12/28/20 0822 12/28/20 1204  GLUCAP 157* 194* 199* 287* 280*   Lipid Profile: Recent Labs    Jan 11, 2021 1542 12/28/20 0457  CHOL  --  105  HDL  --  53  LDLCALC  --  43  TRIG 98 44  CHOLHDL  --  2.0   Thyroid Function Tests: No results for input(s): TSH, T4TOTAL, FREET4, T3FREE, THYROIDAB in the last 72 hours. Anemia Panel: Recent Labs    12/27/20 0545 12/28/20 0457  FERRITIN 1,252* 1,389*   Sepsis Labs: Recent Labs  Lab 01-11-21 1542 12/27/20 0836  PROCALCITON <0.10  --   LATICACIDVEN 1.3 1.3    Recent Results (from the past 240 hour(s))  Blood Culture (routine x 2)     Status: None (Preliminary result)   Collection Time: 01-11-21  4:58 PM   Specimen: BLOOD  Result Value Ref Range Status   Specimen Description   Final    BLOOD LEFT ANTECUBITAL Performed at [facility], [address] [place], [place]    Special Requests   Final    BOTTLES DRAWN AEROBIC AND ANAEROBIC Blood Culture adequate volume Performed at [facility], [address] [place], [place]    Culture   Final    NO GROWTH 2 DAYS Performed at [facility], [address], [place], [address] [place]    Report Status PENDING  Incomplete  Blood Culture (routine x 2)     Status: None (Preliminary result)   Collection Time: 12/27/20  8:08 AM   Specimen: BLOOD LEFT HAND  Result Value Ref Range Status   Specimen Description   Final    BLOOD LEFT HAND Performed at [facility], [address] [place], [place]    Special Requests   Final    BOTTLES DRAWN AEROBIC AND ANAEROBIC Blood Culture results may not be optimal due to  an inadequate volume of blood received in culture bottles Performed at [facility], [address] [place], [place] [address]    Culture   Final    NO GROWTH < 24 HOURS Performed at [facility], [address] [place], [place] [address]    Report Status PENDING  Incomplete         Radiology Studies: DG Chest Port 1 View  Result Date: 10-24-21 CLINICAL DATA:  Shortness of breath. EXAM: PORTABLE CHEST 1 VIEW COMPARISON:  November 25, 2020 FINDINGS: Mild linear scarring and/or atelectasis is seen within the left lung base. There is no evidence of a pleural effusion or pneumothorax. The heart size and mediastinal contours are within normal limits. A chronic sixth right rib fracture is seen. Evidence of prior vertebroplasty is noted within the midthoracic and upper lumbar spine. IMPRESSION: Mild left basilar linear scarring and/or atelectasis. Electronically Signed   By: Aram Candelahaddeus  Houston M.D.   On: 011-16-22 17:19        Scheduled Meds: . vitamin C  500 mg Oral Daily  . baricitinib  4 mg Oral Daily  . enoxaparin (LOVENOX) injection  40 mg Subcutaneous Q24H  . feeding supplement  237 mL Oral BID BM  . insulin aspart  0-20 Units Subcutaneous TID WC  . insulin aspart  0-5 Units Subcutaneous QHS  . Ipratropium-Albuterol  1 puff Inhalation QID  . linagliptin  5 mg Oral Daily  . methylPREDNISolone (SOLU-MEDROL) injection  60 mg Intravenous Q12H  . multivitamin with minerals  1 tablet Oral Daily  . zinc sulfate  220 mg Oral Daily   Continuous Infusions: . remdesivir 100 mg in NS 100 mL 100 mg (12/28/20 0940)     LOS: 2 days    Time spent:40 min    Lannie Yusuf, Roselind MessierURTIS J, MD [facility] Pager [phone]  If 7PM-7AM, please contact night-coverage [email or web address] Password [email or web address] 12/28/2020, 1:58 PM

## 2020-12-29 DIAGNOSIS — J9601 Acute respiratory failure with hypoxia: Secondary | ICD-10-CM | POA: Diagnosis not present

## 2020-12-29 DIAGNOSIS — R630 Anorexia: Secondary | ICD-10-CM | POA: Diagnosis present

## 2020-12-29 DIAGNOSIS — U071 COVID-19: Secondary | ICD-10-CM | POA: Diagnosis not present

## 2020-12-29 DIAGNOSIS — I10 Essential (primary) hypertension: Secondary | ICD-10-CM | POA: Diagnosis not present

## 2020-12-29 DIAGNOSIS — F0390 Unspecified dementia without behavioral disturbance: Secondary | ICD-10-CM | POA: Diagnosis present

## 2020-12-29 DIAGNOSIS — E119 Type 2 diabetes mellitus without complications: Secondary | ICD-10-CM | POA: Diagnosis not present

## 2020-12-29 LAB — CBC WITH DIFFERENTIAL/PLATELET
Abs Immature Granulocytes: 0.02 10*3/uL (ref 0.00–0.07)
Basophils Absolute: 0 10*3/uL (ref 0.0–0.1)
Basophils Relative: 0 %
Eosinophils Absolute: 0 10*3/uL (ref 0.0–0.5)
Eosinophils Relative: 0 %
HCT: 34.1 % — ABNORMAL LOW (ref 36.0–46.0)
Hemoglobin: 11.2 g/dL — ABNORMAL LOW (ref 12.0–15.0)
Immature Granulocytes: 0 %
Lymphocytes Relative: 20 %
Lymphs Abs: 1.1 10*3/uL (ref 0.7–4.0)
MCH: 26.4 pg (ref 26.0–34.0)
MCHC: 32.8 g/dL (ref 30.0–36.0)
MCV: 80.2 fL (ref 80.0–100.0)
Monocytes Absolute: 0.2 10*3/uL (ref 0.1–1.0)
Monocytes Relative: 4 %
Neutro Abs: 4.2 10*3/uL (ref 1.7–7.7)
Neutrophils Relative %: 76 %
Platelets: 284 10*3/uL (ref 150–400)
RBC: 4.25 MIL/uL (ref 3.87–5.11)
RDW: 15.9 % — ABNORMAL HIGH (ref 11.5–15.5)
WBC: 5.5 10*3/uL (ref 4.0–10.5)
nRBC: 0 % (ref 0.0–0.2)

## 2020-12-29 LAB — COMPREHENSIVE METABOLIC PANEL
ALT: 25 U/L (ref 0–44)
AST: 34 U/L (ref 15–41)
Albumin: 2.9 g/dL — ABNORMAL LOW (ref 3.5–5.0)
Alkaline Phosphatase: 40 U/L (ref 38–126)
Anion gap: 12 (ref 5–15)
BUN: 49 mg/dL — ABNORMAL HIGH (ref 8–23)
CO2: 25 mmol/L (ref 22–32)
Calcium: 8.8 mg/dL — ABNORMAL LOW (ref 8.9–10.3)
Chloride: 98 mmol/L (ref 98–111)
Creatinine, Ser: 0.63 mg/dL (ref 0.44–1.00)
GFR, Estimated: >60 mL/min (ref >60)
Glucose, Bld: 210 mg/dL — ABNORMAL HIGH (ref 70–99)
Potassium: 3.9 mmol/L (ref 3.5–5.1)
Sodium: 135 mmol/L (ref 135–145)
Total Bilirubin: 0.7 mg/dL (ref 0.3–1.2)
Total Protein: 6.3 g/dL — ABNORMAL LOW (ref 6.5–8.1)

## 2020-12-29 LAB — LACTATE DEHYDROGENASE: LDH: 201 U/L — ABNORMAL HIGH (ref 98–192)

## 2020-12-29 LAB — GLUCOSE, CAPILLARY
Glucose-Capillary: 215 mg/dL — ABNORMAL HIGH (ref 70–99)
Glucose-Capillary: 183 mg/dL — ABNORMAL HIGH (ref 70–99)
Glucose-Capillary: 336 mg/dL — ABNORMAL HIGH (ref 70–99)
Glucose-Capillary: 171 mg/dL — ABNORMAL HIGH (ref 70–99)

## 2020-12-29 LAB — FERRITIN: Ferritin: 1666 ng/mL — ABNORMAL HIGH (ref 11–307)

## 2020-12-29 LAB — PHOSPHORUS: Phosphorus: 2.4 mg/dL — ABNORMAL LOW (ref 2.5–4.6)

## 2020-12-29 LAB — D-DIMER, QUANTITATIVE: D-Dimer, Quant: 0.98 ug/mL-FEU — ABNORMAL HIGH (ref 0.00–0.50)

## 2020-12-29 LAB — MAGNESIUM: Magnesium: 2 mg/dL (ref 1.7–2.4)

## 2020-12-29 LAB — C-REACTIVE PROTEIN: CRP: 4.7 mg/dL — ABNORMAL HIGH (ref <1.0)

## 2020-12-29 MED ORDER — DEXTROSE-NACL 5-0.9 % IV SOLN: INTRAVENOUS | Status: DC

## 2020-12-29 NOTE — TOC Progression Note (Signed)
Transition of Care [facility]) - Progression Note    Patient Details  Name: Amy Hood MRN: [id number] Date of Birth: 1941-02-16  Transition of Care [facility]) CM/SW Contact  Darleene Cleaver, [place] Phone Number: 12/29/2020, 11:02 AM  Clinical Narrative:     CSW spoke to [facility] home health, they are currently following patient.  They can continue following patient once she is ready for discharge.     Expected Discharge Plan and Services    Plan to return back home with home health through [facility].                                  Social Determinants of Health (SDOH) Interventions    Readmission Risk Interventions No flowsheet data found.

## 2020-12-29 NOTE — Progress Notes (Addendum)
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  Patient also has known atrial fibrillation hyperlipidemia among other things.  Husband apparently is also positive for COVID-19.  Her aunt has been her caretaker.  She usually goes to the bathroom with a walker and they feed her.  Family all around her.  Patient was not vaccinated against COVID-19.  She was brought in now with the shortness of breath and confusion.  Was found to be COVID-19 positive and now requiring oxygen.  Patient appears sick and unwell.  Has low potassium today.  Otherwise hemodynamically stable.  Patient being admitted to the hospital with COVID-19 infection very symptomatic.  ED Course: Temperature 98.2 blood pressure 170/97, pulse 105 respirate 22 oxygen sat 97% on 2 L.  White count 7.6.Hemoglobin 11.9 and platelets normal.  Potassium is 3.1.  Chloride 97 CO2 26.  Glucose 135 and BUN 24.  Creatinine is normal 0.63.  Gap of 16.  BNP 143 LDH 201 troponin is 33 with a delta of 5-5.  Triglyceride 98 ferritin 1444 CRP 7.1 and lactic acid 1.3.  COVID-negative screen is positive.  Chest x-ray showed linear atelectasis versus scaring mildly.  Patient being admitted with COVID 19 infection symptomatic.   Subjective: 1/21 afebrile overnight alert, cooperative.  Per RN patient now eating more of her meals.  Granddaughter states that patient has not been mobile in approximately a month secondary to overall body aches.   Assessment & Plan: Covid vaccination; unvaccinated   Principal Problem:   Acute respiratory failure due to COVID-19 [facility]) Active Problems:   Essential  hypertension   CAD (coronary artery disease)   PAF (paroxysmal atrial fibrillation) (HCC)   Type 2 diabetes mellitus without complication (HCC)   Sepsis due to COVID-19 (HCC)  Sepsis due to COVID  - On admission patient meets criteria for sepsis WBC<4, RR> 20, site of infection lungs,   Acute respiratory failure with hypoxia/COVID pneumonia COVID-19 Labs  Recent Labs    01/05/2021 1542 12/27/20 0545 12/27/20 0554 12/28/20 0457 12/29/20 0439  DDIMER 0.76*  --  0.85* 0.70* 0.98*  FERRITIN 1,444* 1,252*  --  1,389* 1,666*  LDH 201*  --   --  181 201*  CRP 7.1* 10.1*  --  7.2* 4.7*   1/18 POC SARS coronavirus positive  - Baricitinib per pharmacy protocol -Remdesivir per pharmacy protocol - Solu-Medrol 60 mg BID - Vitamins per COVID protocol -Combivent QID -Flutter valve - Incentive spirometry -Continuous pulse ox; telemetry - Prone patient 16 hours/day, if patient cannot tolerate prone 2 to 3 hours per shift -Out of bed to chair/ambulate.mobilize patient.  Will need to go through translator or Thao granddaughter who can translate  Dementia - Per daughter patient has some baseline dementia, and was being worked up for it.  Anorexia - Per daughter at home patient has not been eating very well prior to hospitalization. - 1/20 D5- 0.9% saline 79ml/hr - Marinol 2.5 mg BID  Hypomagnesmia - Magnesium goal> 2 - 1/19 magnesium IV 3 g  DM type II controlled with Hyperglycemia - 1/20 Hemoglobin A1c = 7.0  - 1/20 Lantus 5 units daily -  Resistant SSI - Tradjenta 5 mg daily -LDL= 43  Goals of care - 1/21 PT/OT consult mobilize patient daily and evaluate for SNF.  Will need to go through translator or Thao granddaughter who can translate   DVT prophylaxis: Lovenox Code Status: Full Family Communication: 1/21 spoke with Thao (granddaughter) who is the primary POC counseled on plan of care answered all questions. Status is: Inpatient    Dispo: The patient is from: Home               Anticipated d/c is to: Home              Anticipated d/c date is: 1/22              Patient currently unstable      Consultants:    Procedures/Significant Events:    I have personally reviewed and interpreted all radiology studies and my findings are as above.  VENTILATOR SETTINGS: Nasal cannula 1/21 Flow 3 L/min SPO2 94%   Cultures 1/18 POC SARS coronavirus positive   Antimicrobials: Anti-infectives (From admission, onward)   Start     Ordered Stop   12/27/20 1000  remdesivir 100 mg in sodium chloride 0.9 % 100 mL IVPB       "Followed by" Linked Group Details   04-18-21 1654 12/31/20 0959   12/27/20 1000  remdesivir 100 mg in sodium chloride 0.9 % 100 mL IVPB  Status:  Discontinued       "Followed by" Linked Group Details   04-18-21 1903 04-18-21 1907   04-18-21 1900  remdesivir 200 mg in sodium chloride 0.9% 250 mL IVPB  Status:  Discontinued       "Followed by" Linked Group Details   04-18-21 1903 04-18-21 1907   04-18-21 1800  remdesivir 200 mg in sodium chloride 0.9% 250 mL IVPB       "Followed by" Linked Group Details   04-18-21 1654 04-18-21 1845       Devices    LINES / TUBES:      Continuous Infusions: . remdesivir 100 mg in NS 100 mL 100 mg (12/29/20 0902)     Objective: Vitals:   12/28/20 2153 12/29/20 0500 12/29/20 0901 12/29/20 1211  BP: 100/78 112/77  128/83  Pulse: 88 80  85  Resp: 20 17  20   Temp: 97.7 F (36.5 C) (!) 97.4 F (36.3 C)  97.6 F (36.4 C)  TempSrc: Oral Oral  Oral  SpO2: 94% 92% 90% 94%  Weight:      Height:        Intake/Output Summary (Last 24 hours) at 12/29/2020 1413 Last data filed at 12/29/2020 0900 Gross per 24 hour  Intake 120 ml  Output --  Net 120 ml   Filed Weights   12/28/20 0456 12/28/20 0542  Weight: 54.9 kg 54.9 kg   Physical Exam:  General: Alert, follows some commands, positive acute respiratory distress Eyes: negative scleral hemorrhage, negative anisocoria, negative  icterus ENT: Negative Runny nose, negative gingival bleeding, Neck:  Negative scars, masses, torticollis, lymphadenopathy, JVD Lungs: decreased breath sounds bilaterally without wheezes or crackles Cardiovascular: Regular rate and rhythm without murmur gallop or rub normal S1 and S2 Abdomen: negative abdominal pain, nondistended, positive soft, bowel sounds, no rebound, no ascites, no appreciable mass Extremities: No significant cyanosis, clubbing, or edema bilateral lower extremities Skin: Negative rashes, lesions, ulcers Psychiatric:  Negative depression, negative anxiety, negative fatigue, negative mania  Central nervous system:  Cranial nerves II through XII intact, tongue/uvula  midline, all extremities muscle strength 5/5, sensation intact throughout,negative dysarthria, negative expressive aphasia, negative receptive aphasia.  .     Data Reviewed: Care during the described time interval was provided by me .  I have reviewed this patient's available data, including medical history, events of note, physical examination, and all test results as part of my evaluation.  CBC: Recent Labs  Lab 2020-12-27 1542 12/27/20 0554 12/28/20 0457 12/29/20 0439  WBC 7.6 3.4* 3.3* 5.5  NEUTROABS 6.1 2.5 2.1 4.2  HGB 11.9* 11.6* 10.7* 11.2*  HCT 35.7* 35.4* 32.1* 34.1*  MCV 79.0* 79.9* 79.1* 80.2  PLT 256 234 263 284   Basic Metabolic Panel: Recent Labs  Lab 27-Dec-2020 1542 12/27/20 0554 12/28/20 0457 12/29/20 0439  NA 139 139 134* 135  K 3.1* 3.5 3.7 3.9  CL 97* 100 97* 98  CO2 26 22 26 25   GLUCOSE 135* 167* 300* 210*  BUN 24* 28* 46* 49*  CREATININE 0.63 0.56 0.63 0.63  CALCIUM 9.0 8.6* 8.7* 8.8*  MG  --  1.6* 2.3 2.0  PHOS  --  4.4 2.4* 2.4*   GFR: Estimated Creatinine Clearance: 42.9 mL/min (by C-G formula based on SCr of 0.63 mg/dL). Liver Function Tests: Recent Labs  Lab 12/27/20 1542 12/27/20 0554 12/28/20 0457 12/29/20 0439  AST 29 32 37 34  ALT 23 22 25 25   ALKPHOS  43 45 42 40  BILITOT 1.1 0.9 0.6 0.7  PROT 7.3 7.0 6.2* 6.3*  ALBUMIN 3.5 3.3* 2.9* 2.9*   No results for input(s): LIPASE, AMYLASE in the last 168 hours. No results for input(s): AMMONIA in the last 168 hours. Coagulation Profile: No results for input(s): INR, PROTIME in the last 168 hours. Cardiac Enzymes: No results for input(s): CKTOTAL, CKMB, CKMBINDEX, TROPONINI in the last 168 hours. BNP (last 3 results) No results for input(s): PROBNP in the last 8760 hours. HbA1C: Recent Labs    12/28/20 0457  HGBA1C 7.0*   CBG: Recent Labs  Lab 12/28/20 1204 12/28/20 1733 12/28/20 2051 12/29/20 0839 12/29/20 1210  GLUCAP 280* 268* 142* 215* 183*   Lipid Profile: Recent Labs    Dec 27, 2020 1542 12/28/20 0457  CHOL  --  105  HDL  --  53  LDLCALC  --  43  TRIG 98 44  CHOLHDL  --  2.0   Thyroid Function Tests: No results for input(s): TSH, T4TOTAL, FREET4, T3FREE, THYROIDAB in the last 72 hours. Anemia Panel: Recent Labs    12/28/20 0457 12/29/20 0439  FERRITIN 1,389* 1,666*   Sepsis Labs: Recent Labs  Lab 2020/12/27 1542 12/27/20 0836  PROCALCITON <0.10  --   LATICACIDVEN 1.3 1.3    Recent Results (from the past 240 hour(s))  Blood Culture (routine x 2)     Status: None (Preliminary result)   Collection Time: 12-27-20  4:58 PM   Specimen: BLOOD  Result Value Ref Range Status   Specimen Description   Final    BLOOD LEFT ANTECUBITAL Performed at [facility], [address], [place], [place]    Special Requests   Final    BOTTLES DRAWN AEROBIC AND ANAEROBIC Blood Culture adequate volume Performed at [facility], [address] [place], [place]    Culture   Final    NO GROWTH 3 DAYS Performed at [facility], [address] [place], [address] [place]    Report Status PENDING  Incomplete  Blood Culture (routine x 2)  Status: None (Preliminary result)   Collection Time: 12/27/20  8:08 AM    Specimen: BLOOD LEFT HAND  Result Value Ref Range Status   Specimen Description   Final    BLOOD LEFT HAND Performed at [facility], [address] [place], [place] [address]    Special Requests   Final    BOTTLES DRAWN AEROBIC AND ANAEROBIC Blood Culture results may not be optimal due to an inadequate volume of blood received in culture bottles Performed at [facility], [address] [place], [place] [address]    Culture   Final    NO GROWTH 2 DAYS Performed at [facility], [address], [place], [place] [address]    Report Status PENDING  Incomplete         Radiology Studies: No results found.      Scheduled Meds: . vitamin C  500 mg Oral Daily  . baricitinib  4 mg Oral Daily  . dronabinol  2.5 mg Oral BID AC  . enoxaparin (LOVENOX) injection  40 mg Subcutaneous Q24H  . feeding supplement  237 mL Oral BID BM  . insulin aspart  0-20 Units Subcutaneous TID WC  . insulin aspart  0-5 Units Subcutaneous QHS  . insulin glargine  5 Units Subcutaneous Daily  . Ipratropium-Albuterol  1 puff Inhalation QID  . linagliptin  5 mg Oral Daily  . methylPREDNISolone (SOLU-MEDROL) injection  60 mg Intravenous Q12H  . multivitamin with minerals  1 tablet Oral Daily  . zinc sulfate  220 mg Oral Daily   Continuous Infusions: . remdesivir 100 mg in NS 100 mL 100 mg (12/29/20 0902)     LOS: 3 days    Time spent:40 min    Umi Mainor, Roselind Messier, MD [facility] Pager [phone]  If 7PM-7AM, please contact night-coverage [email or web address] Password [facility] 12/29/2020, 2:13 PM

## 2020-12-29 NOTE — Care Management Important Message (Signed)
Important Message  Patient Details IM Letter placed in Patient's door Caddy. Name: Amy Hood MRN: [id number] Date of Birth: Jan 21, 1941   Medicare Important Message Given:  Yes     Caren Macadam 12/29/2020, 10:55 AM

## 2020-12-30 DIAGNOSIS — E119 Type 2 diabetes mellitus without complications: Secondary | ICD-10-CM | POA: Diagnosis not present

## 2020-12-30 DIAGNOSIS — I10 Essential (primary) hypertension: Secondary | ICD-10-CM | POA: Diagnosis not present

## 2020-12-30 DIAGNOSIS — U071 COVID-19: Secondary | ICD-10-CM | POA: Diagnosis not present

## 2020-12-30 DIAGNOSIS — J9601 Acute respiratory failure with hypoxia: Secondary | ICD-10-CM | POA: Diagnosis not present

## 2020-12-30 LAB — CBC WITH DIFFERENTIAL/PLATELET
Abs Immature Granulocytes: 0.03 10*3/uL (ref 0.00–0.07)
Basophils Absolute: 0 10*3/uL (ref 0.0–0.1)
Basophils Relative: 0 %
Eosinophils Absolute: 0 10*3/uL (ref 0.0–0.5)
Eosinophils Relative: 0 %
HCT: 34.8 % — ABNORMAL LOW (ref 36.0–46.0)
Hemoglobin: 11.3 g/dL — ABNORMAL LOW (ref 12.0–15.0)
Immature Granulocytes: 0 %
Lymphocytes Relative: 10 %
Lymphs Abs: 0.7 10*3/uL (ref 0.7–4.0)
MCH: 26.2 pg (ref 26.0–34.0)
MCHC: 32.5 g/dL (ref 30.0–36.0)
MCV: 80.7 fL (ref 80.0–100.0)
Monocytes Absolute: 0.2 10*3/uL (ref 0.1–1.0)
Monocytes Relative: 3 %
Neutro Abs: 6.4 10*3/uL (ref 1.7–7.7)
Neutrophils Relative %: 87 %
Platelets: 303 10*3/uL (ref 150–400)
RBC: 4.31 MIL/uL (ref 3.87–5.11)
RDW: 15.9 % — ABNORMAL HIGH (ref 11.5–15.5)
WBC: 7.4 10*3/uL (ref 4.0–10.5)
nRBC: 0 % (ref 0.0–0.2)

## 2020-12-30 LAB — LACTATE DEHYDROGENASE: LDH: 221 U/L — ABNORMAL HIGH (ref 98–192)

## 2020-12-30 LAB — COMPREHENSIVE METABOLIC PANEL
ALT: 37 U/L (ref 0–44)
AST: 48 U/L — ABNORMAL HIGH (ref 15–41)
Albumin: 2.7 g/dL — ABNORMAL LOW (ref 3.5–5.0)
Alkaline Phosphatase: 38 U/L (ref 38–126)
Anion gap: 5 (ref 5–15)
BUN: 38 mg/dL — ABNORMAL HIGH (ref 8–23)
CO2: 30 mmol/L (ref 22–32)
Calcium: 8.4 mg/dL — ABNORMAL LOW (ref 8.9–10.3)
Chloride: 101 mmol/L (ref 98–111)
Creatinine, Ser: 0.49 mg/dL (ref 0.44–1.00)
GFR, Estimated: >60 mL/min (ref >60)
Glucose, Bld: 203 mg/dL — ABNORMAL HIGH (ref 70–99)
Potassium: 4.1 mmol/L (ref 3.5–5.1)
Sodium: 136 mmol/L (ref 135–145)
Total Bilirubin: 0.8 mg/dL (ref 0.3–1.2)
Total Protein: 6 g/dL — ABNORMAL LOW (ref 6.5–8.1)

## 2020-12-30 LAB — GLUCOSE, CAPILLARY
Glucose-Capillary: 280 mg/dL — ABNORMAL HIGH (ref 70–99)
Glucose-Capillary: 275 mg/dL — ABNORMAL HIGH (ref 70–99)
Glucose-Capillary: 163 mg/dL — ABNORMAL HIGH (ref 70–99)
Glucose-Capillary: 85 mg/dL (ref 70–99)

## 2020-12-30 LAB — FERRITIN: Ferritin: 1784 ng/mL — ABNORMAL HIGH (ref 11–307)

## 2020-12-30 LAB — PHOSPHORUS: Phosphorus: 1.5 mg/dL — ABNORMAL LOW (ref 2.5–4.6)

## 2020-12-30 LAB — D-DIMER, QUANTITATIVE: D-Dimer, Quant: 1.28 ug/mL-FEU — ABNORMAL HIGH (ref 0.00–0.50)

## 2020-12-30 LAB — MAGNESIUM: Magnesium: 1.7 mg/dL (ref 1.7–2.4)

## 2020-12-30 LAB — C-REACTIVE PROTEIN: CRP: 2.8 mg/dL — ABNORMAL HIGH (ref <1.0)

## 2020-12-30 MED ORDER — POTASSIUM PHOSPHATES 15 MMOLE/5ML IV SOLN
40.0000 mmol | Freq: Once | INTRAVENOUS | Status: AC
  Administered 2020-12-30: 40 mmol via INTRAVENOUS
  Filled 2020-12-30: qty 13.33

## 2020-12-30 NOTE — Evaluation (Signed)
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 admitted with SOB and confusion, + Covid, unvaccinated  Clinical Impression  Pt admitted with above diagnosis.  PT not very cooperative today, seems to be fatigued, pushing herself back in to the bed. SpO2=90s throughout on 1L [place] O2. From previous admissions it appears pt has extensive family support, therefore recommending HHPT at this time. Will follow in acute setting.  Pt currently with functional limitations due to the deficits listed below (see PT Problem List). Pt will benefit from skilled PT to increase their independence and safety with mobility to allow discharge to the venue listed below.       Follow Up Recommendations Home health PT    Equipment Recommendations  None recommended by PT    Recommendations for Other Services       Precautions / Restrictions Precautions Precautions: Fall;Other (comment) Precaution Comments: monitor VS Restrictions Weight Bearing Restrictions: No      Mobility  Bed Mobility Overal bed mobility: Needs Assistance Bed Mobility: Supine to Sit;Sit to Supine     Supine to sit: Mod assist;HOB elevated Sit to supine: Supervision   General bed mobility comments: assisted pt to sitting, pt self assisted reaching for bed rail, used PT's hand to pull self fully upright, then pt returned self to supine and closed her eyes    Transfers                 General transfer comment: Pt passively refused to attempt stand by pushing herself back onto bed.  Ambulation/Gait                Stairs            Wheelchair Mobility    Modified Rankin (Stroke Patients Only)       Balance   Sitting-balance support: Feet supported;Single extremity supported    Sitting balance - Comments: Difficult to assess as pt frequently leaning and pushing upper body back onto bed.                                     Pertinent Vitals/Pain Pain Assessment: Faces Faces Pain Scale: No hurt    Home Living Family/patient expects to be discharged to:: Private residence Living Arrangements: Spouse/significant other;Children Available Help at Discharge: Family;Available 24 hours/day Type of Home: House Home Access: Level entry     Home Layout: One level Home Equipment: Walker - 2 wheels;Cane - single point;Wheelchair - manual Additional Comments: per daughter, pt ambulates wtih a RW and is independent with ADLs.  Family performs all IADLs. home environment and PLOF taken from previous admission documentation    Prior Function Level of Independence: Independent with assistive device(s)         Comments: uses RW     Hand Dominance        Extremity/Trunk Assessment   Upper Extremity Assessment Upper Extremity Assessment: Defer to OT evaluation;Generalized weakness    Lower Extremity Assessment Lower Extremity Assessment: Generalized weakness;Difficult to assess due to impaired cognition       Communication   Communication: Prefers language other than [place];Other (comment)  Cognition Arousal/Alertness:  (sleepy, arouses to voice and light touch) Behavior During Therapy: Flat affect Overall Cognitive Status: History  of cognitive impairments - at baseline                                        General Comments      Exercises     Assessment/Plan    PT Assessment Patient needs continued PT services  PT Problem List Decreased strength;Decreased mobility;Decreased activity tolerance;Decreased knowledge of use of DME       PT Treatment Interventions DME instruction;Therapeutic activities;Gait training;Functional mobility training;Therapeutic exercise;Patient/family education    PT Goals (Current goals  can be found in the Care Plan section)  Acute Rehab PT Goals Patient Stated Goal: Pt unable to express goal. PT Goal Formulation: With patient Time For Goal Achievement: 02/10/21 Potential to Achieve Goals: Good    Frequency Min 3X/week   Barriers to discharge        Co-evaluation               AM-PAC PT "6 Clicks" Mobility  Outcome Measure Help needed turning from your back to your side while in a flat bed without using bedrails?: A Little Help needed moving from lying on your back to sitting on the side of a flat bed without using bedrails?: A Lot Help needed moving to and from a bed to a chair (including a wheelchair)?: Total Help needed standing up from a chair using your arms (e.g., wheelchair or bedside chair)?: Total Help needed to walk in hospital room?: Total Help needed climbing 3-5 steps with a railing? : Total 6 Click Score: 9    End of Session   Activity Tolerance: Patient limited by fatigue Patient left: in bed;with call bell/phone within reach;with bed alarm set   PT Visit Diagnosis: Other abnormalities of gait and mobility (R26.89)    Time: 3007-6226 PT Time Calculation (min) (ACUTE ONLY): 18 min   Charges:   PT Evaluation $PT Eval Low Complexity: 1 Low          Laymon Stockert, PT  Acute Rehab Dept ([facility]) [phone] Pager [phone]  12/30/2020   [facility] 12/30/2020, 4:26 PM

## 2020-12-30 NOTE — Progress Notes (Signed)
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  Patient also has known atrial fibrillation hyperlipidemia among other things.  Husband apparently is also positive for COVID-19.  Her aunt has been her caretaker.  She usually goes to the bathroom with a walker and they feed her.  Family all around her.  Patient was not vaccinated against COVID-19.  She was brought in now with the shortness of breath and confusion.  Was found to be COVID-19 positive and now requiring oxygen.  Patient appears sick and unwell.  Has low potassium today.  Otherwise hemodynamically stable.  Patient being admitted to the hospital with COVID-19 infection very symptomatic.  ED Course: Temperature 98.2 blood pressure 170/97, pulse 105 respirate 22 oxygen sat 97% on 2 L.  White count 7.6.Hemoglobin 11.9 and platelets normal.  Potassium is 3.1.  Chloride 97 CO2 26.  Glucose 135 and BUN 24.  Creatinine is normal 0.63.  Gap of 16.  BNP 143 LDH 201 troponin is 33 with a delta of 5-5.  Triglyceride 98 ferritin 1444 CRP 7.1 and lactic acid 1.3.  COVID-negative screen is positive.  Chest x-ray showed linear atelectasis versus scaring mildly.  Patient being admitted with COVID 19 infection symptomatic.   Subjective: 1/22 afebrile overnight alert, even with translator difficult to get patient to cooperate. Per OT note patient needs significant help with all ADLs.   Assessment & Plan: Covid vaccination; unvaccinated   Principal Problem:   Acute respiratory failure due to COVID-19 [facility]) Active Problems:   Essential hypertension   CAD (coronary artery disease)   PAF (paroxysmal  atrial fibrillation) (HCC)   Type 2 diabetes mellitus without complication (HCC)   Sepsis due to COVID-19 [facility])   Anorexia   Dementia without behavioral disturbance (HCC)  Sepsis due to COVID  - On admission patient meets criteria for sepsis WBC<4, RR> 20, site of infection lungs,   Acute respiratory failure with hypoxia/COVID pneumonia COVID-19 Labs  Recent Labs    12/28/20 0457 12/29/20 0439 12/30/20 0335  DDIMER 0.70* 0.98* 1.28*  FERRITIN 1,389* 1,666* 1,784*  LDH 181 201* 221*  CRP 7.2* 4.7* 2.8*   1/18 POC SARS coronavirus positive  - Baricitinib per pharmacy protocol -Remdesivir per pharmacy protocol - Solu-Medrol 60 mg BID - Vitamins per COVID protocol -Combivent QID -Flutter valve - Incentive spirometry -Continuous pulse ox; telemetry - Prone patient 16 hours/day, if patient cannot tolerate prone 2 to 3 hours per shift -Out of bed to chair/ambulate.mobilize patient.  Will need to go through translator or Thao granddaughter who can translate  Dementia - Per daughter patient has some baseline dementia, and was being worked up for it.  Anorexia - Per daughter at home patient has not been eating very well prior to hospitalization. - 1/20 D5- 0.9% saline 10ml/hr - Marinol 2.5 mg BID  Hypomagnesmia - Magnesium goal> 2 - 1/22 magnesium IV 2 g  Hypophosphatemia - Phosphorus goal> 2.5 - K-Phos 40 mmol  DM type II controlled with Hyperglycemia - 1/20 Hemoglobin A1c = 7.0  - 1/20 Lantus 5 units daily - Resistant SSI - Tradjenta 5 mg daily -LDL= 43  Goals  of care - 1/21 PT/OT consult mobilize patient daily and evaluate for SNF.  Will need to go through translator or Reyes Ivan granddaughter who can translate -1/22 spoke with RN Delice Bison Charge Nurse and explained why we needed to have an exception to the visitor policy for this 1 patient.  She agreed to contact [facility] and explain the steps for being allowed to visit her sister.  DVT prophylaxis: Lovenox Code Status:  Full Family Communication: 1/22 spoke with Thao (granddaughter) who is the primary POC counseled on plan of care answered all questions.  She agreed that [facility] would be the best person to be the visitor (exception to the rule) in order to help facilitate care for the patient. Status is: Inpatient    Dispo: The patient is from: Home              Anticipated d/c is to: Home              Anticipated d/c date is: 1/22              Patient currently unstable      Consultants:    Procedures/Significant Events:    I have personally reviewed and interpreted all radiology studies and my findings are as above.  VENTILATOR SETTINGS: Nasal cannula 1/22 Flow 0 to 1 L/min SPO2 92 to 94%   Cultures 1/18 POC SARS coronavirus positive   Antimicrobials: Anti-infectives (From admission, onward)   Start     Ordered Stop   12/27/20 1000  remdesivir 100 mg in sodium chloride 0.9 % 100 mL IVPB       "Followed by" Linked Group Details   Jan 17, 2021 1654 12/31/20 0959   12/27/20 1000  remdesivir 100 mg in sodium chloride 0.9 % 100 mL IVPB  Status:  Discontinued       "Followed by" Linked Group Details   01/17/21 1903 01-17-21 1907   01-17-2021 1900  remdesivir 200 mg in sodium chloride 0.9% 250 mL IVPB  Status:  Discontinued       "Followed by" Linked Group Details   Jan 17, 2021 1903 01/17/2021 1907   17-Jan-2021 1800  remdesivir 200 mg in sodium chloride 0.9% 250 mL IVPB       "Followed by" Linked Group Details   01-17-21 1654 01/17/21 1845       Devices    LINES / TUBES:      Continuous Infusions: . dextrose 5 % and 0.9% NaCl 75 mL/hr at 12/29/20 1835     Objective: Vitals:   12/29/20 1211 12/29/20 2140 12/30/20 0530 12/30/20 1239  BP: 128/83 92/72 123/88 (!) 136/94  Pulse: 85   94  Resp: 20   18  Temp: 97.6 F (36.4 C) 97.6 F (36.4 C) 97.6 F (36.4 C) 97.7 F (36.5 C)  TempSrc: Oral Oral Oral Axillary  SpO2: 94%   95%  Weight:      Height:        Intake/Output Summary  (Last 24 hours) at 12/30/2020 1311 Last data filed at 12/30/2020 1000 Gross per 24 hour  Intake 240 ml  Output 100 ml  Net 140 ml   Filed Weights   12/28/20 0456 12/28/20 0542  Weight: 54.9 kg 54.9 kg   Physical Exam:  General: Alert, follows some commands, positive acute respiratory distress Eyes: negative scleral hemorrhage, negative anisocoria, negative icterus ENT: Negative Runny nose, negative gingival bleeding, Neck:  Negative scars, masses, torticollis, lymphadenopathy, JVD Lungs: decreased breath sounds bilaterally without wheezes or crackles Cardiovascular: Regular rate  and rhythm without murmur gallop or rub normal S1 and S2 Abdomen: negative abdominal pain, nondistended, positive soft, bowel sounds, no rebound, no ascites, no appreciable mass Extremities: No significant cyanosis, clubbing, or edema bilateral lower extremities Skin: Negative rashes, lesions, ulcers Psychiatric:  Negative depression, negative anxiety, negative fatigue, negative mania  Central nervous system:  Cranial nerves II through XII intact, tongue/uvula midline, all extremities muscle strength 5/5, sensation intact throughout,negative dysarthria, negative expressive aphasia, negative receptive aphasia.  .     Data Reviewed: Care during the described time interval was provided by me .  I have reviewed this patient's available data, including medical history, events of note, physical examination, and all test results as part of my evaluation.  CBC: Recent Labs  Lab 12/27/2020 1542 12/27/20 0554 12/28/20 0457 12/29/20 0439 12/30/20 0335  WBC 7.6 3.4* 3.3* 5.5 7.4  NEUTROABS 6.1 2.5 2.1 4.2 6.4  HGB 11.9* 11.6* 10.7* 11.2* 11.3*  HCT 35.7* 35.4* 32.1* 34.1* 34.8*  MCV 79.0* 79.9* 79.1* 80.2 80.7  PLT 256 234 263 284 303   Basic Metabolic Panel: Recent Labs  Lab 12/16/2020 1542 12/27/20 0554 12/28/20 0457 12/29/20 0439 12/30/20 0335  NA 139 139 134* 135 136  K 3.1* 3.5 3.7 3.9 4.1  CL 97*  100 97* 98 101  CO2 26 22 26 25 30   GLUCOSE 135* 167* 300* 210* 203*  BUN 24* 28* 46* 49* 38*  CREATININE 0.63 0.56 0.63 0.63 0.49  CALCIUM 9.0 8.6* 8.7* 8.8* 8.4*  MG  --  1.6* 2.3 2.0 1.7  PHOS  --  4.4 2.4* 2.4* 1.5*   GFR: Estimated Creatinine Clearance: 42.9 mL/min (by C-G formula based on SCr of 0.49 mg/dL). Liver Function Tests: Recent Labs  Lab 01/01/2021 1542 12/27/20 0554 12/28/20 0457 12/29/20 0439 12/30/20 0335  AST 29 32 37 34 48*  ALT 23 22 25 25  37  ALKPHOS 43 45 42 40 38  BILITOT 1.1 0.9 0.6 0.7 0.8  PROT 7.3 7.0 6.2* 6.3* 6.0*  ALBUMIN 3.5 3.3* 2.9* 2.9* 2.7*   No results for input(s): LIPASE, AMYLASE in the last 168 hours. No results for input(s): AMMONIA in the last 168 hours. Coagulation Profile: No results for input(s): INR, PROTIME in the last 168 hours. Cardiac Enzymes: No results for input(s): CKTOTAL, CKMB, CKMBINDEX, TROPONINI in the last 168 hours. BNP (last 3 results) No results for input(s): PROBNP in the last 8760 hours. HbA1C: Recent Labs    12/28/20 0457  HGBA1C 7.0*   CBG: Recent Labs  Lab 12/29/20 1210 12/29/20 1725 12/29/20 2027 12/30/20 0818 12/30/20 1230  GLUCAP 183* 336* 171* 280* 275*   Lipid Profile: Recent Labs    12/28/20 0457  CHOL 105  HDL 53  LDLCALC 43  TRIG 44  CHOLHDL 2.0   Thyroid Function Tests: No results for input(s): TSH, T4TOTAL, FREET4, T3FREE, THYROIDAB in the last 72 hours. Anemia Panel: Recent Labs    12/29/20 0439 12/30/20 0335  FERRITIN 1,666* 1,784*   Sepsis Labs: Recent Labs  Lab 12/11/2020 1542 12/27/20 0836  PROCALCITON <0.10  --   LATICACIDVEN 1.3 1.3    Recent Results (from the past 240 hour(s))  Blood Culture (routine x 2)     Status: None (Preliminary result)   Collection Time: 12/11/2020  4:58 PM   Specimen: BLOOD  Result Value Ref Range Status   Specimen Description   Final    BLOOD LEFT ANTECUBITAL Performed at [facility], [address]  [place], [place] [address]    Special Requests   Final    BOTTLES DRAWN AEROBIC AND ANAEROBIC Blood Culture adequate volume Performed at [facility], [address] [place], [place] [address]    Culture   Final    NO GROWTH 3 DAYS Performed at [facility], [address] [place], [place] [address]    Report Status PENDING  Incomplete  Blood Culture (routine x 2)     Status: None (Preliminary result)   Collection Time: 12/27/20  8:08 AM   Specimen: BLOOD LEFT HAND  Result Value Ref Range Status   Specimen Description   Final    BLOOD LEFT HAND Performed at [facility], [address] [place], [place] [address]    Special Requests   Final    BOTTLES DRAWN AEROBIC AND ANAEROBIC Blood Culture results may not be optimal due to an inadequate volume of blood received in culture bottles Performed at [facility], [address] [place], [place] [address]    Culture   Final    NO GROWTH 2 DAYS Performed at [facility], [address] [place], [place] [address]    Report Status PENDING  Incomplete         Radiology Studies: No results found.      Scheduled Meds: . vitamin C  500 mg Oral Daily  . baricitinib  4 mg Oral Daily  . dronabinol  2.5 mg Oral BID AC  . enoxaparin (LOVENOX) injection  40 mg Subcutaneous Q24H  . feeding supplement  237 mL Oral BID BM  . insulin aspart  0-20 Units Subcutaneous TID WC  . insulin aspart  0-5 Units Subcutaneous QHS  . insulin glargine  5 Units Subcutaneous Daily  . Ipratropium-Albuterol  1 puff Inhalation QID  . linagliptin  5 mg Oral Daily  . methylPREDNISolone (SOLU-MEDROL) injection  60 mg Intravenous Q12H  . multivitamin with minerals  1 tablet Oral Daily  . zinc sulfate  220 mg Oral Daily   Continuous Infusions: . dextrose 5 % and 0.9% NaCl 75 mL/hr at 12/29/20 1835     LOS: 4 days    Time spent:40 min    Earley Grobe, Roselind MessierURTIS J, MD [facility] Pager [phone]  If 7PM-7AM, please contact night-coverage [email or web address] Password [email or web address] 12/30/2020, 1:11 PM

## 2020-12-30 NOTE — Progress Notes (Signed)
Notified granddaughter, Reyes Ivan, that special permission has been granted for family member, probably dtr Hue, to come stay with patient for support and communication needs. Spoke with dtr Hue and she said she cannot come and stay with her mother because she is taking care of her father who is just out of hospital and there is only her. Md aware. Melton Alar, RN

## 2020-12-30 NOTE — Evaluation (Signed)
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 admitted with SOB and confusion, + Covid, unvaccinated   Clinical Impression   Patient is currently requiring assistance with ADLs including total assistance with toileting, LE dressing, and with bathing, as well as maximum assist with UE dressing and likely at least minimal assist with eating, all of which may be below patient's typical baseline at home based on chart review of past therapy evaluations.  No family in room to provide information.  During this evaluation, patient was limited by lethargy, poor activity tolerance and poor participation with her own self care, which has the potential to impact patient's and caregiver's safety and independence during functional mobility, as well as performance for ADLs. [employer] AM-PAC "6-clicks" Daily Activity Inpatient Short Form score of 9/24 indicates 79.59% ADL impairment this session. Patient lives with her family, who is able to provide 24/7 supervision and assistance.  Per chart, pt's husband also with COVID, but pt's Aunt has been her primary caregiver.  Patient demonstrates fair rehab potential, and should benefit from continued skilled occupational therapy services while in acute care to maximize safety, independence and quality of life at home.   ?   Follow Up Recommendations  Supervision/Assistance - 24 hour    Equipment Recommendations       Recommendations for Other Services       Precautions / Restrictions Precautions Precautions: Fall;Other (comment) Precaution Comments: HOH, non-English speaking.  AIRBORN/CONTACT Restrictions Weight Bearing Restrictions: No      Mobility Bed Mobility Overal bed mobility: Needs Assistance Bed Mobility:  Supine to Sit;Sit to Supine     Supine to sit: Mod assist;HOB elevated Sit to supine: Min guard;HOB elevated   General bed mobility comments: Posterior scooting to HOB: Total Assist.  Anterior scooting to EOB in sitting with Total Assist using draw sheet.    Transfers Overall transfer level: Needs assistance               General transfer comment: Pt passively refused to attempt stand by pushing herself back onto bed.    Balance Overall balance assessment: Needs assistance Sitting-balance support: Feet supported;Single extremity supported Sitting balance-Leahy Scale: Poor Sitting balance - Comments: Difficult to assess as pt frequently leaning and pushing upper body back onto bed. Postural control: Right lateral lean     Standing balance comment: Unable to assess                           ADL either performed or assessed with clinical judgement   ADL Overall ADL's : Needs assistance/impaired   Eating/Feeding Details (indicate cue type and reason): Pt refused water cup, pushed it away. Grooming: Total assistance Grooming Details (indicate cue type and reason): Pt refused oral hygiene with visual cue of toothbrush with toothpaste placed in pt's dominant hand with vidual cues to initiate. Pt returned toothbrush to table, shook her head and attempted to lie back down. Upper Body Bathing: Total assistance;Bed level Upper Body Bathing Details (indicate cue type and reason): Pt not participating in self care. Lower Body Bathing: Total assistance;Bed level   Upper Body Dressing : Maximal assistance;Bed level Upper Body Dressing Details (indicate cue type and reason): Based on UE ROM, however pt not participating in ADLs Lower Body Dressing: Total  assistance;Bed level     Toilet Transfer Details (indicate cue type and reason): Unable to attempt. Pt passively refusing to attempt stand by pushing herself back onto the bed and repeatedly trying to lie back  down. Toileting- Clothing Manipulation and Hygiene: Total assistance;Bed level Toileting - Clothing Manipulation Details (indicate cue type and reason): On pure wick     Functional mobility during ADLs: Minimal assistance;Moderate assistance;Total assistance General ADL Comments: Please see Mobilit section.     Vision   Additional Comments: Unable to assess. Pt keeping eyes closed most of time. Eyes open with brief eye contact to name call.     Perception     Praxis      Pertinent Vitals/Pain Pain Assessment: Faces Faces Pain Scale: Hurts little more Pain Location: Pt unable to localize. Pain Descriptors / Indicators: Grimacing;Guarding Pain Intervention(s): Limited activity within patient's tolerance;Monitored during session;Repositioned     Hand Dominance Right   Extremity/Trunk Assessment Upper Extremity Assessment Upper Extremity Assessment: Generalized weakness;Difficult to assess due to impaired cognition (Pt did mirror visual cues and raised UEs overhead while in supine. AROM appears near [facility]. Unable to perform MMT due to language barrier and lethargy.)   Lower Extremity Assessment Lower Extremity Assessment: Defer to PT evaluation       Communication Communication Communication: Prefers language other than [place];Other (comment) (No family present to help interpret. IPAD does not offer pt's spoke language.)   Cognition Arousal/Alertness: Lethargic Behavior During Therapy: Flat affect Overall Cognitive Status: History of cognitive impairments - at baseline                                 General Comments: Per chart, pt with baseline dementia.   General Comments       Exercises     Shoulder Instructions      Home Living Family/patient expects to be discharged to:: Private residence Living Arrangements: Spouse/significant other;Children Available Help at Discharge: Family;Available 24 hours/day Type of Home: House Home Access: Level  entry     Home Layout: One level     Bathroom Shower/Tub: Chief Strategy Officer: Standard     Home Equipment: Environmental consultant - 2 wheels;Cane - single point;Wheelchair - manual   Additional Comments: Chart review revealed: "per daughter, pt ambulates wtih a RW and is independent with ADLs".  Family performs all IADLs.      Prior Functioning/Environment Level of Independence: Independent with assistive device(s)        Comments: uses RW        OT Problem List: Decreased strength;Decreased coordination;Cardiopulmonary status limiting activity;Pain;Decreased cognition;Decreased activity tolerance;Decreased safety awareness;Impaired balance (sitting and/or standing);Decreased knowledge of use of DME or AE;Decreased knowledge of precautions      OT Treatment/Interventions: Self-care/ADL training;Therapeutic exercise;Therapeutic activities;Energy conservation;Cognitive remediation/compensation;DME and/or AE instruction;Patient/family education;Balance training    OT Goals(Current goals can be found in the care plan section) Acute Rehab OT Goals Patient Stated Goal: Pt unable to express goal. OT Goal Formulation: Patient unable to participate in goal setting ADL Goals Pt Will Perform Eating: with min guard assist;sitting;bed level Pt Will Perform Grooming: sitting;with set-up;with supervision Pt Will Perform Upper Body Bathing: with min guard assist;sitting Pt Will Transfer to Toilet: with min guard assist;bedside commode;stand pivot transfer Pt/caregiver will Perform Home Exercise Program: Increased strength;Both right and left upper extremity;With Supervision (At bed or recliner level, tolerating 8 reps each exercise with vitals stable.)  OT Frequency: Min 2X/week  Barriers to D/C:    No known barriers       Co-evaluation              AM-PAC OT "6 Clicks" Daily Activity     Outcome Measure Help from another person eating meals?: A Little Help from another  person taking care of personal grooming?: Total Help from another person toileting, which includes using toliet, bedpan, or urinal?: Total Help from another person bathing (including washing, rinsing, drying)?: Total Help from another person to put on and taking off regular upper body clothing?: A Lot Help from another person to put on and taking off regular lower body clothing?: Total 6 Click Score: 9   End of Session Equipment Utilized During Treatment: Oxygen Nurse Communication: Other (comment) (Nurse cleared OT to see pt prior to Evaluation.)  Activity Tolerance: Patient limited by lethargy;Patient limited by fatigue Patient left: with bed alarm set;in bed;with call bell/phone within reach  OT Visit Diagnosis: Other symptoms and signs involving cognitive function;Muscle weakness (generalized) (M62.81);Feeding difficulties (R63.3);Pain Pain - part of body:  (Unknown)                Time: 4696-2952 OT Time Calculation (min): 16 min Charges:  OT General Charges $OT Visit: 1 Visit OT Evaluation $OT Eval Low Complexity: 1 Low  Graycee Greeson, OT Acute Rehab Services Office: [phone] 12/30/2020  Theodoro Clock 12/30/2020, 12:35 PM

## 2020-12-31 DIAGNOSIS — I10 Essential (primary) hypertension: Secondary | ICD-10-CM | POA: Diagnosis not present

## 2020-12-31 DIAGNOSIS — U071 COVID-19: Secondary | ICD-10-CM | POA: Diagnosis not present

## 2020-12-31 DIAGNOSIS — J9601 Acute respiratory failure with hypoxia: Secondary | ICD-10-CM | POA: Diagnosis not present

## 2020-12-31 DIAGNOSIS — E119 Type 2 diabetes mellitus without complications: Secondary | ICD-10-CM | POA: Diagnosis not present

## 2020-12-31 LAB — MAGNESIUM: Magnesium: 1.6 mg/dL — ABNORMAL LOW (ref 1.7–2.4)

## 2020-12-31 LAB — CBC WITH DIFFERENTIAL/PLATELET
Abs Immature Granulocytes: 0.11 10*3/uL — ABNORMAL HIGH (ref 0.00–0.07)
Basophils Absolute: 0 10*3/uL (ref 0.0–0.1)
Basophils Relative: 0 %
Eosinophils Absolute: 0 10*3/uL (ref 0.0–0.5)
Eosinophils Relative: 0 %
HCT: 34.5 % — ABNORMAL LOW (ref 36.0–46.0)
Hemoglobin: 11.5 g/dL — ABNORMAL LOW (ref 12.0–15.0)
Immature Granulocytes: 1 %
Lymphocytes Relative: 5 %
Lymphs Abs: 0.7 10*3/uL (ref 0.7–4.0)
MCH: 26.3 pg (ref 26.0–34.0)
MCHC: 33.3 g/dL (ref 30.0–36.0)
MCV: 78.9 fL — ABNORMAL LOW (ref 80.0–100.0)
Monocytes Absolute: 0.5 10*3/uL (ref 0.1–1.0)
Monocytes Relative: 3 %
Neutro Abs: 14.5 10*3/uL — ABNORMAL HIGH (ref 1.7–7.7)
Neutrophils Relative %: 91 %
Platelets: 362 10*3/uL (ref 150–400)
RBC: 4.37 MIL/uL (ref 3.87–5.11)
RDW: 15.8 % — ABNORMAL HIGH (ref 11.5–15.5)
WBC: 15.8 10*3/uL — ABNORMAL HIGH (ref 4.0–10.5)
nRBC: 0.3 % — ABNORMAL HIGH (ref 0.0–0.2)

## 2020-12-31 LAB — LACTATE DEHYDROGENASE: LDH: 222 U/L — ABNORMAL HIGH (ref 98–192)

## 2020-12-31 LAB — CULTURE, BLOOD (ROUTINE X 2)
Culture: NO GROWTH
Special Requests: ADEQUATE

## 2020-12-31 LAB — GLUCOSE, CAPILLARY
Glucose-Capillary: 238 mg/dL — ABNORMAL HIGH (ref 70–99)
Glucose-Capillary: 255 mg/dL — ABNORMAL HIGH (ref 70–99)
Glucose-Capillary: 202 mg/dL — ABNORMAL HIGH (ref 70–99)
Glucose-Capillary: 244 mg/dL — ABNORMAL HIGH (ref 70–99)

## 2020-12-31 LAB — COMPREHENSIVE METABOLIC PANEL
ALT: 33 U/L (ref 0–44)
AST: 36 U/L (ref 15–41)
Albumin: 2.7 g/dL — ABNORMAL LOW (ref 3.5–5.0)
Alkaline Phosphatase: 45 U/L (ref 38–126)
Anion gap: 7 (ref 5–15)
BUN: 25 mg/dL — ABNORMAL HIGH (ref 8–23)
CO2: 27 mmol/L (ref 22–32)
Calcium: 7.7 mg/dL — ABNORMAL LOW (ref 8.9–10.3)
Chloride: 101 mmol/L (ref 98–111)
Creatinine, Ser: 0.52 mg/dL (ref 0.44–1.00)
GFR, Estimated: >60 mL/min (ref >60)
Glucose, Bld: 196 mg/dL — ABNORMAL HIGH (ref 70–99)
Potassium: 4.5 mmol/L (ref 3.5–5.1)
Sodium: 135 mmol/L (ref 135–145)
Total Bilirubin: 0.7 mg/dL (ref 0.3–1.2)
Total Protein: 5.9 g/dL — ABNORMAL LOW (ref 6.5–8.1)

## 2020-12-31 LAB — FERRITIN: Ferritin: 1769 ng/mL — ABNORMAL HIGH (ref 11–307)

## 2020-12-31 LAB — C-REACTIVE PROTEIN: CRP: 1.8 mg/dL — ABNORMAL HIGH (ref <1.0)

## 2020-12-31 LAB — PHOSPHORUS: Phosphorus: 5.1 mg/dL — ABNORMAL HIGH (ref 2.5–4.6)

## 2020-12-31 LAB — D-DIMER, QUANTITATIVE: D-Dimer, Quant: 2.02 ug/mL-FEU — ABNORMAL HIGH (ref 0.00–0.50)

## 2020-12-31 MED ORDER — MAGNESIUM SULFATE 2 GM/50ML IV SOLN
2.0000 g | Freq: Once | INTRAVENOUS | Status: AC
  Administered 2020-12-31: 2 g via INTRAVENOUS
  Filled 2020-12-31: qty 50

## 2020-12-31 NOTE — Progress Notes (Signed)
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  Patient also has known atrial fibrillation hyperlipidemia among other things.  Husband apparently is also positive for COVID-19.  Her aunt has been her caretaker.  She usually goes to the bathroom with a walker and they feed her.  Family all around her.  Patient was not vaccinated against COVID-19.  She was brought in now with the shortness of breath and confusion.  Was found to be COVID-19 positive and now requiring oxygen.  Patient appears sick and unwell.  Has low potassium today.  Otherwise hemodynamically stable.  Patient being admitted to the hospital with COVID-19 infection very symptomatic.  ED Course: Temperature 98.2 blood pressure 170/97, pulse 105 respirate 22 oxygen sat 97% on 2 L.  White count 7.6.Hemoglobin 11.9 and platelets normal.  Potassium is 3.1.  Chloride 97 CO2 26.  Glucose 135 and BUN 24.  Creatinine is normal 0.63.  Gap of 16.  BNP 143 LDH 201 troponin is 33 with a delta of 5-5.  Triglyceride 98 ferritin 1444 CRP 7.1 and lactic acid 1.3.  COVID-negative screen is positive.  Chest x-ray showed linear atelectasis versus scaring mildly.  Patient being admitted with COVID 19 infection symptomatic.   Subjective: 1/23 afebrile overnight appears withdrawn possibly is given.  Spoke at length with Hue (daughter) disease daughter) who has been allowed to visit patient.  Hue (daughter) states that since patient's MI and back surgery she would work very little with home PT.  In fact would for the most part lying in the bed.  Assessment &  Plan: Covid vaccination; unvaccinated   Principal Problem:   Acute respiratory failure due to COVID-19 [facility]) Active Problems:   Essential hypertension   CAD (coronary artery disease)   PAF (paroxysmal atrial fibrillation) (HCC)   Type 2 diabetes mellitus without complication (HCC)   Sepsis due to COVID-19 [facility])   Anorexia   Dementia without behavioral disturbance (HCC)  Sepsis due to COVID  - On admission patient meets criteria for sepsis WBC<4, RR> 20, site of infection lungs,   Acute respiratory failure with hypoxia/COVID pneumonia COVID-19 Labs  Recent Labs    12/29/20 0439 12/30/20 0335 12/31/20 0420  DDIMER 0.98* 1.28* 2.02*  FERRITIN 1,666* 1,784* 1,769*  LDH 201* 221* 222*  CRP 4.7* 2.8* 1.8*   1/18 POC SARS coronavirus positive  - Baricitinib per pharmacy protocol -Remdesivir per pharmacy protocol - Solu-Medrol 60 mg BID - Vitamins per COVID protocol -Combivent QID -Flutter valve - Incentive spirometry -Continuous pulse ox; telemetry - Prone patient 16 hours/day, if patient cannot tolerate prone 2 to 3 hours per shift -Out of bed to chair/ambulate.mobilize patient.  Will need to go through translator or Thao granddaughter who can translate  Dementia - Per daughter patient has some baseline dementia, and was being worked up for it.  Anorexia - Per daughter at home patient has not been eating very well prior to hospitalization. - 1/20 D5- 0.9% saline 78ml/hr - Marinol 2.5 mg BID  Hypomagnesmia - Magnesium goal> 2 - 1/23 magnesium IV 2 g  Hypophosphatemia - Phosphorus goal>  2.5   DM type II controlled with Hyperglycemia - 1/20 Hemoglobin A1c = 7.0  - 1/20 Lantus 5 units daily - Resistant SSI - Tradjenta 5 mg daily -LDL= 43  Goals of care - 1/21 PT/OT consult mobilize patient daily and evaluate for SNF.  Will need to go through translator or Reyes Ivanhao granddaughter who can translate -1/22 spoke with RN Delice Bisonara Charge Nurse and explained why we needed  to have an exception to the visitor policy for this 1 patient.  She agreed to contact [facility] and explain the steps for being allowed to visit her sister. -1/23 spoke at length with  Hue (daughter) and I explained to her it appears us that given the multiple medical problems in her past with this new disease patient may have given up.  Question as to if patient would just wish to be made comfortable and be allowed to expire naturally.  Daughter was not sure at this point   DVT prophylaxis: Lovenox Code Status: Full Family Communication: 1/22 spoke with Thao (granddaughter) who is the primary POC counseled on plan of care answered all questions.  She agreed that [facility] would be the best person to be the visitor (exception to the rule) in order to help facilitate care for the patient. Status is: Inpatient    Dispo: The patient is from: Home              Anticipated d/c is to: Home              Anticipated d/c date is: 1/22              Patient currently unstable      Consultants:    Procedures/Significant Events:    I have personally reviewed and interpreted all radiology studies and my findings are as above.  VENTILATOR SETTINGS: Nasal cannula 1/23 Room air SPO2  94%   Cultures 1/18 POC SARS coronavirus positive   Antimicrobials: Anti-infectives (From admission, onward)   Start     Ordered Stop   12/27/20 1000  remdesivir 100 mg in sodium chloride 0.9 % 100 mL IVPB       "Followed by" Linked Group Details   12/20/2020 1654 12/31/20 0959   12/27/20 1000  remdesivir 100 mg in sodium chloride 0.9 % 100 mL IVPB  Status:  Discontinued       "Followed by" Linked Group Details   01/04/2021 1903 01/06/2021 1907   12/16/2020 1900  remdesivir 200 mg in sodium chloride 0.9% 250 mL IVPB  Status:  Discontinued       "Followed by" Linked Group Details   12/21/2020 1903 12/29/2020 1907   12/27/2020 1800  remdesivir 200 mg in sodium chloride 0.9% 250 mL IVPB       "Followed by" Linked Group Details    12/25/2020 1654 01/01/2021 1845       Devices    LINES / TUBES:      Continuous Infusions: . dextrose 5 % and 0.9% NaCl 75 mL/hr at 12/31/20 1205     Objective: Vitals:   12/30/20 2026 12/31/20 0400 12/31/20 0500 12/31/20 0623  BP: (!) 122/93 120/90  (!) 146/97  Pulse: 90 91 92 (!) 102  Resp: 16 16 17    Temp: 98.2 F (36.8 C)   98.2 F (36.8 C)  TempSrc: Oral   Axillary  SpO2: 92% 91% 91%   Weight:      Height:        Intake/Output Summary (Last 24 hours) at 12/31/2020  1320 Last data filed at 12/31/2020 4854 Gross per 24 hour  Intake 640 ml  Output 700 ml  Net -60 ml   Filed Weights   12/28/20 0456 12/28/20 0542  Weight: 54.9 kg 54.9 kg   Physical Exam:  General: Alert, to Hue (daughter) follows some commands, positive acute respiratory distress Eyes: negative scleral hemorrhage, negative anisocoria, negative icterus ENT: Negative Runny nose, negative gingival bleeding, Neck:  Negative scars, masses, torticollis, lymphadenopathy, JVD Lungs: decreased breath sounds bilaterally without wheezes or crackles Cardiovascular: Regular rate and rhythm without murmur gallop or rub normal S1 and S2 Abdomen: negative abdominal pain, nondistended, positive soft, bowel sounds, no rebound, no ascites, no appreciable mass Extremities: No significant cyanosis, clubbing, or edema bilateral lower extremities Skin: Negative rashes, lesions, ulcers Psychiatric:  Negative depression, negative anxiety, negative fatigue, negative mania  Central nervous system:  Cranial nerves II through XII intact, tongue/uvula midline, all extremities muscle strength 5/5, sensation intact throughout, PT was able to get patient to sit on the side of the bed for a few minutes with the help of her daughter, negative dysarthria, negative expressive aphasia, negative receptive aphasia.  .     Data Reviewed: Care during the described time interval was provided by me .  I have reviewed this patient's  available data, including medical history, events of note, physical examination, and all test results as part of my evaluation.  CBC: Recent Labs  Lab 12/27/20 0554 12/28/20 0457 12/29/20 0439 12/30/20 0335 12/31/20 0420  WBC 3.4* 3.3* 5.5 7.4 15.8*  NEUTROABS 2.5 2.1 4.2 6.4 14.5*  HGB 11.6* 10.7* 11.2* 11.3* 11.5*  HCT 35.4* 32.1* 34.1* 34.8* 34.5*  MCV 79.9* 79.1* 80.2 80.7 78.9*  PLT 234 263 284 303 362   Basic Metabolic Panel: Recent Labs  Lab 12/27/20 0554 12/28/20 0457 12/29/20 0439 12/30/20 0335 12/31/20 0420  NA 139 134* 135 136 135  K 3.5 3.7 3.9 4.1 4.5  CL 100 97* 98 101 101  CO2 22 26 25 30 27   GLUCOSE 167* 300* 210* 203* 196*  BUN 28* 46* 49* 38* 25*  CREATININE 0.56 0.63 0.63 0.49 0.52  CALCIUM 8.6* 8.7* 8.8* 8.4* 7.7*  MG 1.6* 2.3 2.0 1.7 1.6*  PHOS 4.4 2.4* 2.4* 1.5* 5.1*   GFR: Estimated Creatinine Clearance: 42.9 mL/min (by C-G formula based on SCr of 0.52 mg/dL). Liver Function Tests: Recent Labs  Lab 12/27/20 0554 12/28/20 0457 12/29/20 0439 12/30/20 0335 12/31/20 0420  AST 32 37 34 48* 36  ALT 22 25 25  37 33  ALKPHOS 45 42 40 38 45  BILITOT 0.9 0.6 0.7 0.8 0.7  PROT 7.0 6.2* 6.3* 6.0* 5.9*  ALBUMIN 3.3* 2.9* 2.9* 2.7* 2.7*   No results for input(s): LIPASE, AMYLASE in the last 168 hours. No results for input(s): AMMONIA in the last 168 hours. Coagulation Profile: No results for input(s): INR, PROTIME in the last 168 hours. Cardiac Enzymes: No results for input(s): CKTOTAL, CKMB, CKMBINDEX, TROPONINI in the last 168 hours. BNP (last 3 results) No results for input(s): PROBNP in the last 8760 hours. HbA1C: No results for input(s): HGBA1C in the last 72 hours. CBG: Recent Labs  Lab 12/30/20 0818 12/30/20 1230 12/30/20 1617 12/30/20 2110 12/31/20 0924  GLUCAP 280* 275* 163* 85 238*   Lipid Profile: No results for input(s): CHOL, HDL, LDLCALC, TRIG, CHOLHDL, LDLDIRECT in the last 72 hours. Thyroid Function Tests: No results  for input(s): TSH, T4TOTAL, FREET4, T3FREE, THYROIDAB in the last 72 hours. Anemia  Panel: Recent Labs    12/30/20 0335 12/31/20 0420  FERRITIN 1,784* 1,769*   Sepsis Labs: Recent Labs  Lab 01/22/21 1542 12/27/20 0836  PROCALCITON <0.10  --   LATICACIDVEN 1.3 1.3    Recent Results (from the past 240 hour(s))  Blood Culture (routine x 2)     Status: None   Collection Time: 2021/01/22  4:58 PM   Specimen: BLOOD  Result Value Ref Range Status   Specimen Description   Final    BLOOD LEFT ANTECUBITAL Performed at [facility], [address], [place], [place] [address]    Special Requests   Final    BOTTLES DRAWN AEROBIC AND ANAEROBIC Blood Culture adequate volume Performed at [facility], [address] [place], [place] [address]    Culture   Final    NO GROWTH 5 DAYS Performed at [facility], [address], [place], [place] [address]    Report Status 12/31/2020 FINAL  Final  Blood Culture (routine x 2)     Status: None (Preliminary result)   Collection Time: 12/27/20  8:08 AM   Specimen: BLOOD LEFT HAND  Result Value Ref Range Status   Specimen Description   Final    BLOOD LEFT HAND Performed at [facility], [address] [place], [place] [address]    Special Requests   Final    BOTTLES DRAWN AEROBIC AND ANAEROBIC Blood Culture results may not be optimal due to an inadequate volume of blood received in culture bottles Performed at [facility], [address] [place], [place] [address]    Culture   Final    NO GROWTH 4 DAYS Performed at [facility], [address] [place], [place] [address]    Report Status PENDING  Incomplete         Radiology Studies: No results found.      Scheduled Meds: . vitamin C  500 mg Oral Daily  . baricitinib  4 mg Oral Daily  . dronabinol  2.5 mg Oral BID AC  . enoxaparin (LOVENOX) injection  40 mg Subcutaneous Q24H  . feeding  supplement  237 mL Oral BID BM  . insulin aspart  0-20 Units Subcutaneous TID WC  . insulin aspart  0-5 Units Subcutaneous QHS  . insulin glargine  5 Units Subcutaneous Daily  . Ipratropium-Albuterol  1 puff Inhalation QID  . linagliptin  5 mg Oral Daily  . methylPREDNISolone (SOLU-MEDROL) injection  60 mg Intravenous Q12H  . multivitamin with minerals  1 tablet Oral Daily  . zinc sulfate  220 mg Oral Daily   Continuous Infusions: . dextrose 5 % and 0.9% NaCl 75 mL/hr at 12/31/20 1205     LOS: 5 days    Time spent:40 min    Loren Sawaya, Roselind Messier, MD [facility] Pager [phone]  If 7PM-7AM, please contact night-coverage [email or web address] Password [email or web address] 12/31/2020, 1:20 PM

## 2020-12-31 NOTE — Progress Notes (Signed)
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 admitted with SOB and confusion, + Covid, unvaccinated    PT Comments    PT revisited pt with daughter present to provide translation and encouragement.  Pt continues to follow limited cues and participate minimally with attempts at therex and mobility.  Pt's daughter reports pt very sedentary at home and largely stays in bed being assisted by family.  Pt's daughter appropriately requesting hospital bed for home use to decrease burden of care - physician aware.  Follow Up Recommendations  Home health PT     Equipment Recommendations  Hospital bed    Recommendations for Other Services       Precautions / Restrictions Precautions Precautions: Fall Precaution Comments: monitor VS Restrictions Weight Bearing Restrictions: No    Mobility  Bed Mobility Overal bed mobility: Needs Assistance Bed Mobility: Sit to Supine;Rolling;Sidelying to Sit Rolling: Mod assist Sidelying to sit: Mod assist;Max assist Supine to sit: Mod assist;+2 for physical assistance;+2 for safety/equipment Sit to supine: Mod assist;+2 for physical assistance;+2 for safety/equipment   General bed mobility comments: Pt following limited cues (daughter translating) but did reach for rail to assist with roll over and held PT hand to assist with move to sitting upright  Transfers                 General transfer comment: Pt to sitting EOB only with poor balance and poor tolerance.  Pt attempting to roll onto L side to return to bed repeatedly throughout ~ 4 min in bedside sitting.  Standing not attempted for safety based on pt limited ability/willingness to participate  Ambulation/Gait                 Stairs              Wheelchair Mobility    Modified Rankin (Stroke Patients Only)       Balance Overall balance assessment: Needs assistance Sitting-balance support: Feet supported;Single extremity supported Sitting balance-Leahy Scale: Poor Sitting balance - Comments: Difficult to assess as pt frequently leaning to left to return to bed       Standing balance comment: Unable to assess                            Cognition Arousal/Alertness: Lethargic Behavior During Therapy: Flat affect Overall Cognitive Status: History of cognitive impairments - at baseline                                 General Comments: Per chart, pt with baseline dementia.      Exercises      General Comments        Pertinent Vitals/Pain Pain Assessment: Faces Faces Pain Scale: No hurt Pain Intervention(s): Limited activity within patient's tolerance;Monitored during session    Home Living                      Prior Function            PT Goals (current goals can now be found in the care plan section) Acute Rehab PT Goals Patient Stated Goal: Pt unable to express goal. PT Goal Formulation: With family Time For Goal  Achievement: 02/10/21 Potential to Achieve Goals: Fair Progress towards PT goals: Progressing toward goals    Frequency    Min 2X/week      PT Plan Current plan remains appropriate    Co-evaluation              AM-PAC PT "6 Clicks" Mobility   Outcome Measure  Help needed turning from your back to your side while in a flat bed without using bedrails?: A Little Help needed moving from lying on your back to sitting on the side of a flat bed without using bedrails?: A Lot Help needed moving to and from a bed to a chair (including a wheelchair)?: Total Help needed standing up from a chair using your arms (e.g., wheelchair or bedside chair)?: Total Help needed to walk in hospital room?: Total Help needed climbing 3-5 steps with a railing? :  Total 6 Click Score: 9    End of Session   Activity Tolerance: Patient limited by fatigue;Patient limited by lethargy Patient left: in bed;with call bell/phone within reach;with bed alarm set;with family/visitor present Nurse Communication: Mobility status PT Visit Diagnosis: Other abnormalities of gait and mobility (R26.89)     Time: 6384-6659 PT Time Calculation (min) (ACUTE ONLY): 23 min  Charges:  $Therapeutic Activity: 23-37 mins                     Mauro Kaufmann PT Acute Rehabilitation Services Pager [phone] Office [phone]    Teralyn Mullins 12/31/2020, 5:12 PM

## 2021-01-01 ENCOUNTER — Inpatient Hospital Stay (HOSPITAL_COMMUNITY): Payer: Medicare Other

## 2021-01-01 ENCOUNTER — Ambulatory Visit: Payer: Medicare Other | Admitting: Cardiovascular Disease

## 2021-01-01 DIAGNOSIS — U071 COVID-19: Secondary | ICD-10-CM | POA: Diagnosis not present

## 2021-01-01 DIAGNOSIS — J9600 Acute respiratory failure, unspecified whether with hypoxia or hypercapnia: Secondary | ICD-10-CM

## 2021-01-01 DIAGNOSIS — I10 Essential (primary) hypertension: Secondary | ICD-10-CM | POA: Diagnosis not present

## 2021-01-01 DIAGNOSIS — E119 Type 2 diabetes mellitus without complications: Secondary | ICD-10-CM | POA: Diagnosis not present

## 2021-01-01 DIAGNOSIS — J9601 Acute respiratory failure with hypoxia: Secondary | ICD-10-CM | POA: Diagnosis not present

## 2021-01-01 LAB — COMPREHENSIVE METABOLIC PANEL WITH GFR
ALT: 29 U/L (ref 0–44)
AST: 31 U/L (ref 15–41)
Albumin: 2.7 g/dL — ABNORMAL LOW (ref 3.5–5.0)
Alkaline Phosphatase: 46 U/L (ref 38–126)
Anion gap: 11 (ref 5–15)
BUN: 22 mg/dL (ref 8–23)
CO2: 23 mmol/L (ref 22–32)
Calcium: 8 mg/dL — ABNORMAL LOW (ref 8.9–10.3)
Chloride: 106 mmol/L (ref 98–111)
Creatinine, Ser: 0.52 mg/dL (ref 0.44–1.00)
GFR, Estimated: >60 mL/min (ref >60)
Glucose, Bld: 155 mg/dL — ABNORMAL HIGH (ref 70–99)
Potassium: 4.6 mmol/L (ref 3.5–5.1)
Sodium: 140 mmol/L (ref 135–145)
Total Bilirubin: 0.8 mg/dL (ref 0.3–1.2)
Total Protein: 5.9 g/dL — ABNORMAL LOW (ref 6.5–8.1)

## 2021-01-01 LAB — GLUCOSE, CAPILLARY
Glucose-Capillary: 189 mg/dL — ABNORMAL HIGH (ref 70–99)
Glucose-Capillary: 202 mg/dL — ABNORMAL HIGH (ref 70–99)

## 2021-01-01 LAB — BLOOD GAS, ARTERIAL
Acid-Base Excess: 1.5 mmol/L (ref 0.0–2.0)
Bicarbonate: 23.8 mmol/L (ref 20.0–28.0)
FIO2: 100
O2 Saturation: 83.9 %
Patient temperature: 98.6
pCO2 arterial: 31 mmHg — ABNORMAL LOW (ref 32.0–48.0)
pH, Arterial: 7.498 — ABNORMAL HIGH (ref 7.350–7.450)
pO2, Arterial: 51 mmHg — ABNORMAL LOW (ref 83.0–108.0)

## 2021-01-01 LAB — CBC WITH DIFFERENTIAL/PLATELET
Abs Immature Granulocytes: 0.12 K/uL — ABNORMAL HIGH (ref 0.00–0.07)
Basophils Absolute: 0 K/uL (ref 0.0–0.1)
Basophils Relative: 0 %
Eosinophils Absolute: 0 K/uL (ref 0.0–0.5)
Eosinophils Relative: 0 %
HCT: 37.1 % (ref 36.0–46.0)
Hemoglobin: 12.5 g/dL (ref 12.0–15.0)
Immature Granulocytes: 1 %
Lymphocytes Relative: 2 %
Lymphs Abs: 0.3 K/uL — ABNORMAL LOW (ref 0.7–4.0)
MCH: 26.2 pg (ref 26.0–34.0)
MCHC: 33.7 g/dL (ref 30.0–36.0)
MCV: 77.6 fL — ABNORMAL LOW (ref 80.0–100.0)
Monocytes Absolute: 0.8 K/uL (ref 0.1–1.0)
Monocytes Relative: 6 %
Neutro Abs: 12.5 K/uL — ABNORMAL HIGH (ref 1.7–7.7)
Neutrophils Relative %: 91 %
Platelets: 381 K/uL (ref 150–400)
RBC: 4.78 MIL/uL (ref 3.87–5.11)
RDW: 15.8 % — ABNORMAL HIGH (ref 11.5–15.5)
WBC: 13.8 K/uL — ABNORMAL HIGH (ref 4.0–10.5)
nRBC: 1.2 % — ABNORMAL HIGH (ref 0.0–0.2)

## 2021-01-01 LAB — CULTURE, BLOOD (ROUTINE X 2): Culture: NO GROWTH

## 2021-01-01 LAB — D-DIMER, QUANTITATIVE: D-Dimer, Quant: 4.13 ug/mL-FEU — ABNORMAL HIGH (ref 0.00–0.50)

## 2021-01-01 LAB — C-REACTIVE PROTEIN: CRP: 3.9 mg/dL — ABNORMAL HIGH (ref <1.0)

## 2021-01-01 LAB — PHOSPHORUS: Phosphorus: 2.8 mg/dL (ref 2.5–4.6)

## 2021-01-01 LAB — FERRITIN: Ferritin: 1632 ng/mL — ABNORMAL HIGH (ref 11–307)

## 2021-01-01 LAB — LACTATE DEHYDROGENASE: LDH: 297 U/L — ABNORMAL HIGH (ref 98–192)

## 2021-01-01 LAB — MAGNESIUM: Magnesium: 2.4 mg/dL (ref 1.7–2.4)

## 2021-01-01 MED ORDER — MORPHINE SULFATE (PF) 2 MG/ML IV SOLN
2.0000 mg | INTRAVENOUS | Status: DC | PRN
  Administered 2021-01-01 (×2): 2 mg via INTRAVENOUS
  Filled 2021-01-01 (×2): qty 1

## 2021-01-01 MED ORDER — CHLORHEXIDINE GLUCONATE CLOTH 2 % EX PADS: 6.0000 | MEDICATED_PAD | Freq: Every day | CUTANEOUS | Status: DC

## 2021-01-01 MED ORDER — LORAZEPAM 2 MG/ML IJ SOLN
1.0000 mg | INTRAMUSCULAR | Status: DC | PRN
  Administered 2021-01-01 (×2): 1 mg via INTRAVENOUS
  Filled 2021-01-01 (×2): qty 1

## 2021-01-01 NOTE — Progress Notes (Addendum)
Called pt daughter Lenn Sink [phone] to update her briefly on her mother's condition. Pt dtg request to contact granddaughter, Juluis Rainier [phone] for Emergent situations as she is the spoke representative for the family. SP, RN

## 2021-01-01 NOTE — Progress Notes (Signed)
Report given to [place], RN on 3W. RN updated on patient's current condition and family/visitor dynamics.

## 2021-01-01 NOTE — Consult Note (Signed)
NAMEMeghann Hood, MRN:  [id number], DOB:  Dec 20, 1940, LOS: 6 ADMISSION DATE:  01/06/2021, CONSULTATION DATE:  1/24  REFERRING MD:  Joseph Art CHIEF COMPLAINT:  Progressive respiratory failure    History of Present Illness:  80 yo female w/ hx as below admitted w/ COVID PNA 1/18 treated  In usual fashion including antivirals, steroids, and baricitinib. In spite of all appropriate therapies developed progressive hypoxic respiratory failure refractory to high flow oxygen and NRB mask. Was transferred to the ICU emergently and PCCM asked to see.    Past Medical History:  CAD, PAF, type II DM, sepsis dementia. Debilitation w/ FTT.   Significant Hospital Events:   Consults:    Procedures:    Significant Diagnostic Tests:    Micro Data:    Antimicrobials:    Interim History / Subjective:   Restless and anxious Objective   Blood pressure 119/69, pulse (Abnormal) 138, temperature 98.7 F (37.1 C), temperature source Oral, resp. rate (Abnormal) 28, height 5' (1.524 m), weight 54.9 kg, SpO2 (Abnormal) 81 %.    FiO2 (%):  [87 %] 87 %   Intake/Output Summary (Last 24 hours) at 01/01/2021 1426 Last data filed at 12/31/2020 2109 Gross per 24 hour  Intake no documentation  Output 200 ml  Net -200 ml   Filed Weights   12/28/20 0456 12/28/20 0542  Weight: 54.9 kg 54.9 kg    Examination: General: 80 year old female non-English-speaking very anxious and restless currently flowing around in the bed, currently on high flow oxygen in addition to nonrebreather mask saturations low 80s with paradoxical effort HENT: Mucous membranes are dry neck veins flat Lungs: Diminished and tachypneic increased respiratory rate Cardiovascular: Tachycardic rhythm Abdomen: Soft Extremities: Warm and dry Neuro:  anxious moves all extremities GU: Due to void  Resolved Hospital Problem list    Assessment & Plan:  Acute hypoxic respiratory failure in setting of Covid pneumonia  Healthcare associated  pneumonia  Severe deconditioning  Coronary artery disease  Dementia with acute metabolic encephalopathy  Terminal delirium  Dysphagia History of PAF History of diabetes   acute hypoxic respiratory failure in the setting of Covid pneumonia and what appears to be complicated by secondary bacterial pneumonia (no organism specified) -Given her advanced age, severe deconditioning at baseline, and failure to respond to current available therapies I do not think that this is a survivable illness.  I have spoken at length with the patient's niece as well as daughter.  I do not think that we should proceed with life support, mechanical ventilation ACLS or any invasive therapies.  She clearly appears to be suffering.  Family was crying but understood and in support of transition to comfort care  Plan Full DNR  discontinue all lab work Continue supplemental oxygen As needed morphine, may need to transition to morphine drip Discontinue therapeutics including baricitinib, and steroids the steroids may be contributing to anxiety As needed bronchodilators Liberalize visiting hours    Best practice (evaluated daily)  Per primary   Labs   CBC: Recent Labs  Lab 12/28/20 0457 12/29/20 0439 12/30/20 0335 12/31/20 0420 01/01/21 0435  WBC 3.3* 5.5 7.4 15.8* 13.8*  NEUTROABS 2.1 4.2 6.4 14.5* 12.5*  HGB 10.7* 11.2* 11.3* 11.5* 12.5  HCT 32.1* 34.1* 34.8* 34.5* 37.1  MCV 79.1* 80.2 80.7 78.9* 77.6*  PLT 263 284 303 362 381    Basic Metabolic Panel: Recent Labs  Lab 12/28/20 0457 12/29/20 0439 12/30/20 0335 12/31/20 0420 01/01/21 0435  NA 134* 135 136 135  140  K 3.7 3.9 4.1 4.5 4.6  CL 97* 98 101 101 106  CO2 26 25 30 27 23   GLUCOSE 300* 210* 203* 196* 155*  BUN 46* 49* 38* 25* 22  CREATININE 0.63 0.63 0.49 0.52 0.52  CALCIUM 8.7* 8.8* 8.4* 7.7* 8.0*  MG 2.3 2.0 1.7 1.6* 2.4  PHOS 2.4* 2.4* 1.5* 5.1* 2.8   GFR: Estimated Creatinine Clearance: 42.9 mL/min (by C-G formula based  on SCr of 0.52 mg/dL). Recent Labs  Lab 12/30/2020 1542 12/27/20 0554 12/27/20 0836 12/28/20 0457 12/29/20 0439 12/30/20 0335 12/31/20 0420 01/01/21 0435  PROCALCITON <0.10  --   --   --   --   --   --   --   WBC 7.6   < >  --    < > 5.5 7.4 15.8* 13.8*  LATICACIDVEN 1.3  --  1.3  --   --   --   --   --    < > = values in this interval not displayed.    Liver Function Tests: Recent Labs  Lab 12/28/20 0457 12/29/20 0439 12/30/20 0335 12/31/20 0420 01/01/21 0435  AST 37 34 48* 36 31  ALT 25 25 37 33 29  ALKPHOS 42 40 38 45 46  BILITOT 0.6 0.7 0.8 0.7 0.8  PROT 6.2* 6.3* 6.0* 5.9* 5.9*  ALBUMIN 2.9* 2.9* 2.7* 2.7* 2.7*   No results for input(s): LIPASE, AMYLASE in the last 168 hours. No results for input(s): AMMONIA in the last 168 hours.  ABG    Component Value Date/Time   PHART 7.498 (H) 01/01/2021 1038   PCO2ART 31.0 (L) 01/01/2021 1038   PO2ART 51.0 (L) 01/01/2021 1038   HCO3 23.8 01/01/2021 1038   TCO2 24 07/22/2018 1918   ACIDBASEDEF 2.1 (H) 12/28/2016 1857   O2SAT 83.9 01/01/2021 1038     Coagulation Profile: No results for input(s): INR, PROTIME in the last 168 hours.  Cardiac Enzymes: No results for input(s): CKTOTAL, CKMB, CKMBINDEX, TROPONINI in the last 168 hours.  HbA1C: Hgb A1c MFr Bld  Date/Time Value Ref Range Status  12/28/2020 04:57 AM 7.0 (H) 4.8 - 5.6 % Final    Comment:    (NOTE) Pre diabetes:          5.7%-6.4%  Diabetes:              >6.4%  Glycemic control for   <7.0% adults with diabetes   11/25/2020 06:15 PM 7.2 (H) 4.8 - 5.6 % Final    Comment:    (NOTE) Pre diabetes:          5.7%-6.4%  Diabetes:              >6.4%  Glycemic control for   <7.0% adults with diabetes     CBG: Recent Labs  Lab 12/31/20 1334 12/31/20 1720 12/31/20 2025 01/01/21 0729 01/01/21 1107  GLUCAP 255* 202* 244* 189* 202*    Review of Systems:   NA  Past Medical History:  She,  has a past medical history of Anemia, Arthritis, CAD  (coronary artery disease) (12/28/2016), Cataracts, bilateral, Essential hypertension, Generalized weakness, Hard of hearing, History of atrial fibrillation, Hyperlipidemia, Hyponatremia, Mild aortic stenosis (12/30/2016), and Type 2 diabetes mellitus (HCC).   Surgical History:   Past Surgical History:  Procedure Laterality Date  . CARDIAC CATHETERIZATION N/A 12/31/2016   Procedure: Left Heart Cath and Coronary Angiography;  Surgeon: 01/02/2017, MD;  Location: [facility] INVASIVE CV LAB;  Service: Cardiovascular;  Laterality: N/A;  . CARDIAC CATHETERIZATION N/A 12/31/2016   Procedure: Coronary Stent Intervention;  Surgeon: Corky Crafts, MD;  Location: [facility] INVASIVE CV LAB;  Service: Cardiovascular;  Laterality: N/A;  . IR KYPHO THORACIC WITH BONE BIOPSY  11/20/2018  . KYPHOPLASTY    . LEFT HEART CATH AND CORONARY ANGIOGRAPHY N/A 10/13/2020   Procedure: LEFT HEART CATH AND CORONARY ANGIOGRAPHY;  Surgeon: Lennette Bihari, MD;  Location: [facility] INVASIVE CV LAB;  Service: Cardiovascular;  Laterality: N/A;     Social History:   reports that she has never smoked. She has never used smokeless tobacco. She reports that she does not drink alcohol and does not use drugs.   Family History:  Her family history includes Diabetes in her mother.   Allergies Allergies  Allergen Reactions  . Lisinopril Cough     Home Medications  Prior to Admission medications   Medication Sig Start Date End Date Taking? Authorizing Provider  alendronate (FOSAMAX) 70 MG tablet Take 70 mg by mouth once a week. 11/05/20  Yes [provider]  aspirin 81 MG chewable tablet Chew 81 mg by mouth daily.   Yes [provider]  carvedilol (COREG) 6.25 MG tablet Take 6.25 mg by mouth 2 (two) times daily with a meal.   Yes [provider]  cholecalciferol (VITAMIN D3) 25 MCG (1000 UT) tablet Take 1,000 Units by mouth daily.   Yes [provider]  clopidogrel (PLAVIX) 75 MG tablet Take 75 mg by  mouth daily.   Yes [provider]  HYDROcodone-acetaminophen (NORCO) 7.5-325 MG tablet Take 1 tablet by mouth 3 (three) times daily as needed for pain. 10/20/20  Yes [provider]  iron polysaccharides (NIFEREX) 150 MG capsule Take 1 capsule (150 mg total) by mouth daily. 09/21/13  Yes Georgann Housekeeper, MD  losartan (COZAAR) 50 MG tablet Take 50 mg by mouth daily.   Yes [provider]  metFORMIN (GLUCOPHAGE) 500 MG tablet Take 500 mg by mouth 2 (two) times daily with a meal. with food 07/31/18  Yes [provider]  rosuvastatin (CRESTOR) 20 MG tablet Take 1 tablet (20 mg total) by mouth daily at 6 PM. 01/02/17  Yes Lama, Sarina Ill, MD  senna-docusate (SENOKOT-S) 8.6-50 MG tablet Take 1 tablet by mouth at bedtime. 11/23/18  Yes Rodolph Bong, MD  guaiFENesin (ROBITUSSIN) 100 MG/5ML SOLN Take 5 mLs (100 mg total) by mouth every 4 (four) hours as needed for cough or to loosen phlegm. 10/14/20   Sheikh, Kateri Mc Latif, DO  polyethylene glycol [facility] / GLYCOLAX) packet Take 17 g by mouth 2 (two) times daily. Patient not taking: No sig reported 11/23/18   Rodolph Bong, MD     Critical care time: 32 min    Simonne Martinet ACNP-BC [facility] Pager # [phone] OR # [phone] if no answer

## 2021-01-01 NOTE — Progress Notes (Signed)
Patient was transferred to 1224 with bilateral wrist restraints on in place. Once family was contacted and agreed for Comfort care and patient was comfortable, restraints were discontinued at 1600. Family at bedside performing a religious prayer at this time.

## 2021-01-01 NOTE — Plan of Care (Signed)
  Problem: Clinical Measurements: Goal: Diagnostic test results will improve Outcome: Not Progressing Goal: Respiratory complications will improve Outcome: Not Progressing Goal: Cardiovascular complication will be avoided Outcome: Not Progressing   

## 2021-01-01 NOTE — Progress Notes (Signed)
Pt VS and LOC place her in the RED MEWS.  However, she was made Comfort Care per the family and the hospitalist.  No further MEWS interventions needed at this time.  Family at the patient's bedside.

## 2021-01-01 NOTE — Progress Notes (Signed)
Discussed comfort measures with patient's family. Family requested that I leave patient's heated high flow [place] on for comfort at this point. Discussed with family the purpose of comfort measures being the peaceful end of life care and comfortable, dignified transition.

## 2021-01-01 NOTE — Progress Notes (Signed)
Patient requires an increase in 02 at 8L [place] and  Saturation sustains about 85-88% Daughter states patients has difficulty chewing and swallowing food in which she holds it in her her mouth. Daughter will be present this AM for patient's therapy session.

## 2021-01-01 NOTE — Care Management Important Message (Signed)
Medicare important message printed for Amy Hood to give to the patient. 

## 2021-01-01 NOTE — Progress Notes (Signed)
Pt transfered to ICU/SD, Room 1424. Hand off Report given to [facility]. Will call pt family to update. SRP,RN

## 2021-01-01 NOTE — Progress Notes (Signed)
Pt constantly past 2 hours pulling off oxygen mask and cannula resulting in low oxygen sats. Pt propped and reminded to leave O2 in place. Pt sats decreased to 75% (lowest) when O2 off. Pt placed on mittens continue to pull at O2 mask and tubing and de-sats with increase lethargy. Updated CN and MD notified. Orders received for bilateral soft wrist restraints. O2 re-placed O2 15 liters [place] high flow and 15 liters NRB mask, sat currently 88-90%. Will continue to monitor; family unavailable to update. SRP, RN

## 2021-01-01 NOTE — Progress Notes (Signed)
Pt requiring increase O2 demand, sat 85% on 15 liters HFNC and 15 liters NRB. Pt lethargy and answers to name and touch only. Pt constantly restless and pull off oxygen resulting in decrease O2 sat 80-83%. Applied soft hand restraints to maintain safety and assist pt from pulling off oxygen equipment to prevent desaturation. Family unavailable to assist with pt. SRP, RN

## 2021-01-01 NOTE — Progress Notes (Signed)
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  Patient also has known atrial fibrillation hyperlipidemia among other things.  Husband apparently is also positive for COVID-19.  Her aunt has been her caretaker.  She usually goes to the bathroom with a walker and they feed her.  Family all around her.  Patient was not vaccinated against COVID-19.  She was brought in now with the shortness of breath and confusion.  Was found to be COVID-19 positive and now requiring oxygen.  Patient appears sick and unwell.  Has low potassium today.  Otherwise hemodynamically stable.  Patient being admitted to the hospital with COVID-19 infection very symptomatic.  ED Course: Temperature 98.2 blood pressure 170/97, pulse 105 respirate 22 oxygen sat 97% on 2 L.  White count 7.6.Hemoglobin 11.9 and platelets normal.  Potassium is 3.1.  Chloride 97 CO2 26.  Glucose 135 and BUN 24.  Creatinine is normal 0.63.  Gap of 16.  BNP 143 LDH 201 troponin is 33 with a delta of 5-5.  Triglyceride 98 ferritin 1444 CRP 7.1 and lactic acid 1.3.  COVID-negative screen is positive.  Chest x-ray showed linear atelectasis versus scaring mildly.  Patient being admitted with COVID 19 infection symptomatic.   Subjective: 1/24 afebrile overnight, patient respiratory status significantly deteriorated now on HFNC +NRB  Assessment & Plan: Covid vaccination; unvaccinated   Principal Problem:   Acute respiratory failure due to COVID-19 [facility]) Active Problems:   Essential hypertension   CAD (coronary artery disease)   PAF (paroxysmal atrial  fibrillation) (HCC)   Type 2 diabetes mellitus without complication (HCC)   Sepsis due to COVID-19 [facility])   Anorexia   Dementia without behavioral disturbance (HCC)  Sepsis due to COVID  - On admission patient meets criteria for sepsis WBC<4, RR> 20, site of infection lungs,   Acute respiratory failure with hypoxia/COVID pneumonia COVID-19 Labs  Recent Labs    12/30/20 0335 12/31/20 0420 01/01/21 0435  DDIMER 1.28* 2.02* 4.13*  FERRITIN 1,784* 1,769* 1,632*  LDH 221* 222* 297*  CRP 2.8* 1.8* 3.9*   1/18 POC SARS coronavirus positive  - Baricitinib per pharmacy protocol -Remdesivir per pharmacy protocol - Solu-Medrol 60 mg BID - Vitamins per COVID protocol -Combivent QID -Flutter valve - Incentive spirometry -Continuous pulse ox; telemetry - Prone patient 16 hours/day, if patient cannot tolerate prone 2 to 3 hours per shift -Out of bed to chair/ambulate.mobilize patient.  Will need to go through translator or Reyes Ivan granddaughter who can translate -1/24 paged by RN that overnight patient respiratory status has significant declined and was now on HFNC + NRB.  Given patient's debilitated state do not feel comfortable keeping patient on floor.  Spoke with RN Erin ICU director and requested bed in the ICU -1/24 spoke with PA Tanja Port spoke with family when the patient arrived to floor and now patient comfort care  Dementia - Per daughter patient has some baseline dementia, and was being worked up for it.  Anorexia - Per daughter at home patient has not been eating very well prior to hospitalization. - 1/20 D5- 0.9% saline 73ml/hr -  Marinol 2.5 mg BID -1/24 comfort care  Hypomagnesmia - Magnesium goal> 2 - 1/23 magnesium IV 2 g  Hypophosphatemia - Phosphorus goal> 2.5   DM type II controlled with Hyperglycemia - 1/20 Hemoglobin A1c = 7.0  - 1/20 Lantus 5 units daily - Resistant SSI - Tradjenta 5 mg daily -LDL= 43  Goals of care - 1/21 PT/OT consult mobilize  patient daily and evaluate for SNF.  Will need to go through translator or Reyes Ivanhao granddaughter who can translate -1/22 spoke with RN Delice Bisonara Charge Nurse and explained why we needed to have an exception to the visitor policy for this 1 patient.  [facility] and explain the steps for being allowed to visit her sister. -1/23 spoke at length with  Hue (daughter) and I explained to her it appears us that given the multiple medical problems in her past with this new disease patient may have given up.  Question as to if patient would just wish to be made comfortable and be allowed to expire naturally.  Daughter was not sure at this point -1/24 palliative care consult;Patient has not been doing well for some time even prior to hospitalization now with Covid pneumonia overnight respiratory status significantly declined is now on high flow nasal cannula and nonrebreather, need to consult with family on changing patient to DNR vs comfort care vs hospice -1/24 comfort care   DVT prophylaxis: Lovenox Code Status: Full Family Communication: 1/22 spoke with Thao (granddaughter) who is the primary POC counseled on plan of care answered all questions.  [facility] the [facility] (exception to the rule) in order to help facilitate care for the patient. 1/24 spoke with HUE and  Thao (granddaughter) counseled him that patient's respiratory status has severely declined overnight and that patient was on significant amount of O2 support.  Counseled them that the next step would be intubation and that intubating her would only prolong her suffering and not change the outcome.Thao (granddaughter) began to cry and stated she needed to contact the rest of her family and discuss the issue, and requested that I call her with the update later today.  Status is: Inpatient    Dispo: The patient is from: Home              Anticipated d/c is to: Home              Anticipated d/c date  is: 1/22              Patient currently unstable      Consultants:    Procedures/Significant Events:    I have personally reviewed and interpreted all radiology studies and my findings are as above.  VENTILATOR SETTINGS: Nasal cannula 1/24 Room air SPO2  94%   Cultures 1/18 POC SARS coronavirus positive   Antimicrobials: Anti-infectives (From admission, onward)   Start     Ordered Stop   12/27/20 1000  remdesivir 100 mg in sodium chloride 0.9 % 100 mL IVPB       "Followed by" Linked Group Details   2021-05-03 1654 12/31/20 0959   12/27/20 1000  remdesivir 100 mg in sodium chloride 0.9 % 100 mL IVPB  Status:  Discontinued       "Followed by" Linked Group Details   2021-05-03 1903 2021-05-03 1907   2021-05-03 1900  remdesivir 200 mg in sodium chloride 0.9% 250 mL IVPB  Status:  Discontinued       "  Followed by" Linked Group Details   12/12/2020 1903 01/04/2021 1907   12/21/2020 1800  remdesivir 200 mg in sodium chloride 0.9% 250 mL IVPB       "Followed by" Linked Group Details   12/16/2020 1654 12/29/2020 1845       Devices    LINES / TUBES:      Continuous Infusions: . dextrose 5 % and 0.9% NaCl 75 mL/hr at 12/31/20 1205     Objective: Vitals:   12/31/20 2027 01/01/21 0200 01/01/21 0400 01/01/21 0810  BP: 125/81  103/65 109/73  Pulse:   (!) 102 (!) 112  Resp:   20 (!) 26  Temp: 98.7 F (37.1 C)     TempSrc: Oral  Oral   SpO2: (!) 87% (!) 88% (!) 88% 90%  Weight:      Height:        Intake/Output Summary (Last 24 hours) at 01/01/2021 1031 Last data filed at 12/31/2020 2109 Gross per 24 hour  Intake 0 ml  Output 200 ml  Net -200 ml   Filed Weights   12/28/20 0456 12/28/20 0542  Weight: 54.9 kg 54.9 kg   Physical Exam:  General: Very fidgety, not responsive to commands, positive worsening acute respiratory distress, cachectic Eyes: negative scleral hemorrhage, negative anisocoria, negative icterus ENT: Negative Runny nose, negative gingival  bleeding, Neck:  Negative scars, masses, torticollis, lymphadenopathy, JVD Lungs: tachypneic, decreased breath sounds bilaterally without wheezes or crackles Cardiovascular: Tachycardic without murmur gallop or rub normal S1 and S2 Abdomen: negative abdominal pain, nondistended, positive soft, bowel sounds, no rebound, no ascites, no appreciable mass Extremities: No significant cyanosis, clubbing, or edema bilateral lower extremities Skin: Negative rashes, lesions, ulcers Psychiatric: Unable to evaluate secondary to worsening respiratory status and altered mental status Central nervous system: Spontaneously moves extremities but does not follow commands unable to evaluate further secondary to worsening respiratory status and altered mental status.      Data Reviewed: Care during the described time interval was provided by me .  I have reviewed this patient's available data, including medical history, events of note, physical examination, and all test results as part of my evaluation.  CBC: Recent Labs  Lab 12/28/20 0457 12/29/20 0439 12/30/20 0335 12/31/20 0420 01/01/21 0435  WBC 3.3* 5.5 7.4 15.8* 13.8*  NEUTROABS 2.1 4.2 6.4 14.5* 12.5*  HGB 10.7* 11.2* 11.3* 11.5* 12.5  HCT 32.1* 34.1* 34.8* 34.5* 37.1  MCV 79.1* 80.2 80.7 78.9* 77.6*  PLT 263 284 303 362 381   Basic Metabolic Panel: Recent Labs  Lab 12/28/20 0457 12/29/20 0439 12/30/20 0335 12/31/20 0420 01/01/21 0435  NA 134* 135 136 135 140  K 3.7 3.9 4.1 4.5 4.6  CL 97* 98 101 101 106  CO2 26 25 30 27 23   GLUCOSE 300* 210* 203* 196* 155*  BUN 46* 49* 38* 25* 22  CREATININE 0.63 0.63 0.49 0.52 0.52  CALCIUM 8.7* 8.8* 8.4* 7.7* 8.0*  MG 2.3 2.0 1.7 1.6* 2.4  PHOS 2.4* 2.4* 1.5* 5.1* 2.8   GFR: Estimated Creatinine Clearance: 42.9 mL/min (by C-G formula based on SCr of 0.52 mg/dL). Liver Function Tests: Recent Labs  Lab 12/28/20 0457 12/29/20 0439 12/30/20 0335 12/31/20 0420 01/01/21 0435  AST 37 34 48*  36 31  ALT 25 25 37 33 29  ALKPHOS 42 40 38 45 46  BILITOT 0.6 0.7 0.8 0.7 0.8  PROT 6.2* 6.3* 6.0* 5.9* 5.9*  ALBUMIN 2.9* 2.9* 2.7* 2.7* 2.7*   No results for  input(s): LIPASE, AMYLASE in the last 168 hours. No results for input(s): AMMONIA in the last 168 hours. Coagulation Profile: No results for input(s): INR, PROTIME in the last 168 hours. Cardiac Enzymes: No results for input(s): CKTOTAL, CKMB, CKMBINDEX, TROPONINI in the last 168 hours. BNP (last 3 results) No results for input(s): PROBNP in the last 8760 hours. HbA1C: No results for input(s): HGBA1C in the last 72 hours. CBG: Recent Labs  Lab 12/31/20 0924 12/31/20 1334 12/31/20 1720 12/31/20 2025 01/01/21 0729  GLUCAP 238* 255* 202* 244* 189*   Lipid Profile: No results for input(s): CHOL, HDL, LDLCALC, TRIG, CHOLHDL, LDLDIRECT in the last 72 hours. Thyroid Function Tests: No results for input(s): TSH, T4TOTAL, FREET4, T3FREE, THYROIDAB in the last 72 hours. Anemia Panel: Recent Labs    12/31/20 0420 01/01/21 0435  FERRITIN 1,769* 1,632*   Sepsis Labs: Recent Labs  Lab Jan 09, 2021 1542 12/27/20 0836  PROCALCITON <0.10  --   LATICACIDVEN 1.3 1.3    Recent Results (from the past 240 hour(s))  Blood Culture (routine x 2)     Status: None   Collection Time: January 09, 2021  4:58 PM   Specimen: BLOOD  Result Value Ref Range Status   Specimen Description   Final    BLOOD LEFT ANTECUBITAL Performed at [facility], [address] [place], [place] [address]    Special Requests   Final    BOTTLES DRAWN AEROBIC AND ANAEROBIC Blood Culture adequate volume Performed at [facility], [address], [place], [place] [address]    Culture   Final    NO GROWTH 5 DAYS Performed at [facility], [address], [place], [place] [address]    Report Status 12/31/2020 FINAL  Final  Blood Culture (routine x 2)     Status: None (Preliminary result)   Collection Time: 12/27/20   8:08 AM   Specimen: BLOOD LEFT HAND  Result Value Ref Range Status   Specimen Description   Final    BLOOD LEFT HAND Performed at [facility], [address] [place], [place] [address]    Special Requests   Final    BOTTLES DRAWN AEROBIC AND ANAEROBIC Blood Culture results may not be optimal due to an inadequate volume of blood received in culture bottles Performed at [facility], [address], [place], [place] [address]    Culture   Final    NO GROWTH 4 DAYS Performed at [facility], [address] [place], [place] [address]    Report Status PENDING  Incomplete         Radiology Studies: No results found.      Scheduled Meds: . vitamin C  500 mg Oral Daily  . baricitinib  4 mg Oral Daily  . dronabinol  2.5 mg Oral BID AC  . enoxaparin (LOVENOX) injection  40 mg Subcutaneous Q24H  . feeding supplement  237 mL Oral BID BM  . insulin aspart  0-20 Units Subcutaneous TID WC  . insulin aspart  0-5 Units Subcutaneous QHS  . insulin glargine  5 Units Subcutaneous Daily  . Ipratropium-Albuterol  1 puff Inhalation QID  . linagliptin  5 mg Oral Daily  . methylPREDNISolone (SOLU-MEDROL) injection  60 mg Intravenous Q12H  . multivitamin with minerals  1 tablet Oral Daily  . zinc sulfate  220 mg Oral Daily   Continuous Infusions: . dextrose 5 % and 0.9% NaCl 75 mL/hr at 12/31/20 1205  LOS: 6 days    Time spent:40 min    WOODS, Roselind Messier, MD [facility] Pager [phone]  If 7PM-7AM, please contact night-coverage [email or web address] Password [facility] 01/01/2021, 10:31 AM

## 2021-01-01 NOTE — Progress Notes (Deleted)
Cardiology Office Note   Date:  01/01/2021   ID:  Amy, Hood July 25, 1941, MRN [id number]  PCP:  Amy Low, MD  Cardiologist:   Skeet Latch, MD   No chief complaint on file.     History of Present Illness: Amy Hood is a 80 y.o. female with CAD status post NSTEMI who presents for ***   She was admitted 12/2016 with an NSTEMI.  She had drug-eluting stents in the LAD and proximal left circumflex.   Past Medical History:  Diagnosis Date  . Anemia   . Arthritis   . CAD (coronary artery disease) 12/28/2016   S/p NSTEMI 1/18:  LHC - oLAD, mLAD 80, pLCx 75, mRCA 25 >> PCI:  3 x 24 mm Synergy DES to mid LAD; 3.5 x 28 mm Synergy DES to proximal LCx // Echo 1/18:  Mild LVH, EF 55-60, apical inf and apical HK, Gr 1 DD, AV mean 13, MAC, trivial MR, reduced RVSF, trivial TR, PASP 35, no eff  . Cataracts, bilateral   . Essential hypertension   . Generalized weakness   . Hard of hearing   . History of atrial fibrillation    post MI - likely related to acute medical illness therefore anticoag not started  . Hyperlipidemia   . Hyponatremia   . Mild aortic stenosis 12/30/2016   Echo 1/18:  Mild LVH, EF 55-60, apical inf and apical HK, Gr 1 DD, AV mean 13, MAC, trivial MR, reduced RVSF, trivial TR, PASP 35, no eff // Echo 09/02/2018:  Severe focal basal septal hypertrophy, EF 60-65, no RWMA, Gr 2 DD, very mild AS (mean 11), mild LAE   . Type 2 diabetes mellitus ([place])     Past Surgical History:  Procedure Laterality Date  . CARDIAC CATHETERIZATION N/A 12/31/2016   Procedure: Left Heart Cath and Coronary Angiography;  Surgeon: Jettie Booze, MD;  Location: [place] [facility];  Service: Cardiovascular;  Laterality: N/A;  . CARDIAC CATHETERIZATION N/A 12/31/2016   Procedure: Coronary Stent Intervention;  Surgeon: Jettie Booze, MD;  Location: [place] [facility];  Service: Cardiovascular;  Laterality: N/A;  . IR KYPHO THORACIC WITH BONE BIOPSY  11/20/2018  .  KYPHOPLASTY    . LEFT HEART CATH AND CORONARY ANGIOGRAPHY N/A 10/13/2020   Procedure: LEFT HEART CATH AND CORONARY ANGIOGRAPHY;  Surgeon: Troy Sine, MD;  Location: [place] [facility];  Service: Cardiovascular;  Laterality: N/A;     No current facility-administered medications for this visit.   No current outpatient medications on file.   Facility-Administered Medications Ordered in Other Visits  Medication Dose Route Frequency Provider Last Rate Last Admin  . ascorbic acid (VITAMIN C) tablet 500 mg  500 mg Oral Daily Allie Bossier, MD   500 mg at 12/30/20 1041  . baricitinib (OLUMIANT) tablet 4 mg  4 mg Oral Daily Leodis Sias T, RPH   4 mg at 12/30/20 1041  . chlorpheniramine-HYDROcodone (TUSSIONEX) 10-8 MG/5ML suspension 5 mL  5 mL Oral Q12H PRN Elwyn Reach, MD   5 mL at 12/27/20 2218  . dextrose 5 %-0.9 % sodium chloride infusion   Intravenous Continuous Allie Bossier, MD 75 mL/hr at 12/31/20 1205 New Bag at 12/31/20 1205  . dronabinol (MARINOL) capsule 2.5 mg  2.5 mg Oral BID AC Allie Bossier, MD   2.5 mg at 12/29/20 1821  . enoxaparin (LOVENOX) injection 40 mg  40 mg Subcutaneous Q24H Elwyn Reach, MD  40 mg at 12/31/20 2109  . feeding supplement (ENSURE ENLIVE / ENSURE PLUS) liquid 237 mL  237 mL Oral BID BM Allie Bossier, MD   237 mL at 12/30/20 1042  . guaiFENesin-dextromethorphan (ROBITUSSIN DM) 100-10 MG/5ML syrup 10 mL  10 mL Oral Q4H PRN Garba, Mohammad L, MD      . insulin aspart (novoLOG) injection 0-20 Units  0-20 Units Subcutaneous TID WC Elwyn Reach, MD   7 Units at 12/31/20 1132  . insulin aspart (novoLOG) injection 0-5 Units  0-5 Units Subcutaneous QHS Elwyn Reach, MD   2 Units at 12/31/20 2113  . insulin glargine (LANTUS) injection 5 Units  5 Units Subcutaneous Daily Allie Bossier, MD   5 Units at 12/31/20 1133  . Ipratropium-Albuterol (COMBIVENT) respimat 1 puff  1 puff Inhalation QID Allie Bossier, MD   1 puff at 12/30/20 2106   . linagliptin (TRADJENTA) tablet 5 mg  5 mg Oral Daily Gala Romney L, MD   5 mg at 12/30/20 1041  . lip balm (CARMEX) ointment   Topical PRN Allie Bossier, MD      . methylPREDNISolone sodium succinate (SOLU-MEDROL) 125 mg/2 mL injection 60 mg  60 mg Intravenous Q12H Allie Bossier, MD   60 mg at 12/31/20 2109  . multivitamin with minerals tablet 1 tablet  1 tablet Oral Daily Allie Bossier, MD   1 tablet at 12/30/20 1041  . zinc sulfate capsule 220 mg  220 mg Oral Daily Allie Bossier, MD   220 mg at 12/30/20 1041    Allergies:   Lisinopril    Social History:  The patient  reports that she has never smoked. She has never used smokeless tobacco. She reports that she does not drink alcohol and does not use drugs.   Family History:  The patient's ***family history includes Diabetes in her mother.    ROS:  Please see the history of present illness.   Otherwise, review of systems are positive for {NONE DEFAULTED:18576::"none"}.   All other systems are reviewed and negative.    PHYSICAL EXAM: VS:  There were no vitals taken for this visit. , BMI There is no height or weight on file to calculate BMI. GENERAL:  Well appearing HEENT:  Pupils equal round and reactive, fundi not visualized, oral mucosa unremarkable NECK:  No jugular venous distention, waveform within normal limits, carotid upstroke brisk and symmetric, no bruits, no thyromegaly LYMPHATICS:  No cervical adenopathy LUNGS:  Clear to auscultation bilaterally HEART:  RRR.  PMI not displaced or sustained,S1 and S2 within normal limits, no S3, no S4, no clicks, no rubs, *** murmurs ABD:  Flat, positive bowel sounds normal in frequency in pitch, no bruits, no rebound, no guarding, no midline pulsatile mass, no hepatomegaly, no splenomegaly EXT:  2 plus pulses throughout, no edema, no cyanosis no clubbing SKIN:  No rashes no nodules NEURO:  Cranial nerves II through XII grossly intact, motor grossly intact throughout PSYCH:   Cognitively intact, oriented to person place and time    EKG:  EKG {ACTION; IS/IS TMA:26333545} ordered today. The ekg ordered today demonstrates ***  Echo 12/2016: Study Conclusions   - Left ventricle: The cavity size was normal. Wall thickness was  increased in a pattern of mild LVH. Systolic function was normal.  The estimated ejection fraction was in the range of 55% to 60%.  There is hypokinesis of the apicalinferior and apical myocardium.  Doppler parameters are consistent with  abnormal left ventricular  relaxation (grade 1 diastolic dysfunction).  - Aortic valve: Mildly calcified annulus. Trileaflet; mildly  calcified leaflets. Mean gradient (S): 13 mm Hg. Peak gradient  (S): 21 mm Hg. VTI ratio of LVOT to aortic valve: 0.56.  - Mitral valve: Calcified annulus. There was trivial regurgitation.  - Right ventricle: The cavity size was mildly dilated. Systolic  function was reduced.  - Right atrium: Central venous pressure (est): 15 mm Hg.  - Tricuspid valve: There was trivial regurgitation.  - Pulmonary arteries: PA peak pressure: 35 mm Hg (S).  - Pericardium, extracardiac: There was no pericardial effusion.   LHC 12/2016:  Mid RCA lesion, 25 %stenosed.  Ost LAD to Prox LAD lesion, 40 %stenosed.  LV end diastolic pressure is normal.  Mid LAD lesion, 80 %stenosed. A STENT SYNERGY DES 3X24 drug eluting stent was successfully placed, postdilated to 3.25.  Post intervention, there is a 0% residual stenosis.  Prox Cx to Mid Cx lesion, 75 %stenosed. A STENT SYNERGY DES 3.5X28 drug eluting stent was successfully placed.  Post intervention, there is a 0% residual stenosis.      Recent Labs: 12/18/2020: B Natriuretic Peptide 143.7 01/01/2021: ALT 29; BUN 22; Creatinine, Ser 0.52; Hemoglobin 12.5; Magnesium 2.4; Platelets 381; Potassium 4.6; Sodium 140    Lipid Panel    Component Value Date/Time   CHOL 105 12/28/2020 0457   TRIG 44 12/28/2020 0457   HDL  53 12/28/2020 0457   CHOLHDL 2.0 12/28/2020 0457   VLDL 9 12/28/2020 0457   LDLCALC 43 12/28/2020 0457      Wt Readings from Last 3 Encounters:  12/28/20 121 lb 0.5 oz (54.9 kg)  11/26/20 127 lb 13.9 oz (58 kg)  10/12/20 138 lb 3.7 oz (62.7 kg)      ASSESSMENT AND PLAN:  ***   Current medicines are reviewed at length with the patient today.  The patient {ACTIONS; HAS/DOES NOT HAVE:19233} concerns regarding medicines.  The following changes have been made:  {PLAN; NO CHANGE:13088:s}  Labs/ tests ordered today include: *** No orders of the defined types were placed in this encounter.    Disposition:   FU with ***     Signed, Joycelin Radloff C. Oval Linsey, MD, [facility]  01/01/2021 9:14 AM    [place]

## 2021-01-01 NOTE — Progress Notes (Signed)
Inpatient Diabetes Program Recommendations  AACE/ADA: New Consensus Statement on Inpatient Glycemic Control (2015)  Target Ranges:  Prepandial:   less than 140 mg/dL      Peak postprandial:   less than 180 mg/dL (1-2 hours)      Critically ill patients:  140 - 180 mg/dL   Results for Amy Hood, Amy Hood (MRN [id number]) as of 01/01/2021 08:52  Ref. Range 12/31/2020 09:24 12/31/2020 13:34 12/31/2020 17:20 12/31/2020 20:25  Glucose-Capillary Latest Ref Range: 70 - 99 mg/dL 585 (H)  7 units NOVOLOG @11 :32am 255 (H)  Novolog NOT given 202 (H)  Novolog NOT given 244 (H)  2 units NOVOLOG    Results for Amy Hood, Amy Hood (MRN Howie Ill) as of 01/01/2021 08:52  Ref. Range 01/01/2021 07:29  Glucose-Capillary Latest Ref Range: 70 - 99 mg/dL 01/03/2021 (H)    Admit COVID+  History: DM2   Home DM Meds:  Metformin 500 mg BID   Current Orders: Novolog 0-20 units ac/hs                            Tradjenta 5 mg Daily      Lantus 5 units Daily    Solumedrol 60 mg BID    MD- Note AM CBGs still slightly elevated.  Please consider increasing Lantus slightly to 7 units Daily     --Will follow patient during hospitalization--  361 RN, MSN, CDE Diabetes Coordinator Inpatient Glycemic Control Team Team Pager: [phone] (8a-5p)

## 2021-01-03 NOTE — Progress Notes (Signed)
Patient's daughter, Lenn Sink, present at bedside. Attempting to communicate treatment to pt. Patient remains sleepy and uncooperative. Oxygen has remained at 3 l/min and sats have stayed 89-91%. Have discussed also with daughter patient not eating or drinking well this shift and of her pocketing food. Md aware and waiting for PT eval while daughter is present.Melton Alar, RN

## 2021-01-03 NOTE — Progress Notes (Signed)
Multiple attempts made to communicate with patient. Spoke with dtr and granddaughter about assisting with communicating with her. Granddaughter attempted to speak with pt on the phone with little response from patient. Patient is able to nod yes to taking medications. She is otherwise not able to prone or adhere to current treatments.Melton Alar, RN

## 2021-01-09 NOTE — Progress Notes (Signed)
Occupational Therapy Discharge Patient Details Name: Amy Hood MRN: [id number] DOB: 1941/11/25 Today's Date: Jan 06, 2021 Time:  -     Patient discharged from OT services secondary to medical decline - will need to re-order OT to resume therapy services. Patient now on comfort measures.  Please see latest therapy progress note for current level of functioning and progress toward goals.    Marlyce Huge OT OT pager: [phone]  GO     Carmelia Roller 2021-01-06, 7:22 AM

## 2021-01-09 NOTE — Progress Notes (Signed)
Physical Therapy Discharge Patient Details Name: Amy Hood MRN: [id number] DOB: 1941-08-11 Today's Date: 01/15/2021 Time:  -     Patient discharged from PT services secondary to medical decline - will need to re-order PT to resume therapy services.Now on Comfort care measures. Please see latest therapy progress note for current level of functioning and progress toward goals.     GP     Rada Hay 2021/01/15, 7:19 AM Blanchard Kelch PT Acute Rehabilitation Services Pager [phone] Office [phone]

## 2021-01-09 NOTE — Death Summary Note (Signed)
Death Summary  Amy Hood IOE:[id number] DOB: [birth year] DOA: 82/19/22  PCP: Georgann Housekeeper, MD PCP/Office notified: No  Admit date: 2020-12-27 Date of Death: 01-03-2021  Final Diagnoses:  Principal Problem:   Acute respiratory failure due to COVID-19 [facility]) Active Problems:   Essential hypertension   CAD (coronary artery disease)   PAF (paroxysmal atrial fibrillation) (HCC)   Type 2 diabetes mellitus without complication (HCC)   Sepsis due to COVID-19 [facility])   Anorexia   Dementia without behavioral disturbance (HCC)   Covid vaccination; unvaccinated   Sepsis due to COVID  - On admission patient meets criteria for sepsis WBC<4, RR> 20, site of infection lungs,   Acute respiratory failure with hypoxia/COVID pneumonia COVID-19 Labs  Recent Labs    12/31/20 0420 01/01/21 0435  DDIMER 2.02* 4.13*  FERRITIN 1,769* 1,632*  LDH 222* 297*  CRP 1.8* 3.9*    Lab Results  Component Value Date   SARSCOV2NAA NEGATIVE 11/25/2020   SARSCOV2NAA NEGATIVE 10/11/2020   12/27/2022 POC SARS coronavirus positive  - Baricitinib per pharmacy protocol -Remdesivir per pharmacy protocol - Solu-Medrol 60 mg BID - Vitamins per COVID protocol -Combivent QID -Flutter valve - Incentive spirometry -Continuous pulse ox; telemetry - Prone patient 16 hours/day, if patient cannot tolerate prone 2 to 3 hours per shift -Out of bed to chair/ambulate.mobilize patient.  Will need to go through translator or Reyes Ivan granddaughter who can translate -1/24 paged by RN that overnight patient respiratory status has significant declined and was now on HFNC + NRB.  Given patient's debilitated state do not feel comfortable keeping patient on floor.  Spoke with RN Erin ICU director and requested bed in the ICU -1/24 spoke with PA Tanja Port spoke with family when the patient arrived to floor and now patient comfort care  Dementia - Per daughter patient has some baseline dementia, and was being worked up for  it.  Anorexia - Per daughter at home patient has not been eating very well prior to hospitalization. - 1/20 D5- 0.9% saline 62ml/hr - Marinol 2.5 mg BID -1/24 comfort care  Hypomagnesmia - Magnesium goal> 2 - 1/23 magnesium IV 2 g  Hypophosphatemia - Phosphorus goal> 2.5   DM type II controlled with Hyperglycemia - 1/20 Hemoglobin A1c = 7.0  - 1/20 Lantus 5 units daily - Resistant SSI - Tradjenta 5 mg daily -LDL= 43  Goals of care - 1/21 PT/OT consult mobilize patient daily and evaluate for SNF.  Will need to go through translator or Reyes Ivan granddaughter who can translate -1/22 spoke with RN Delice Bison Charge Nurse and explained why we needed to have an exception to the visitor policy for this 1 patient.  She agreed to contact [facility] and explain the steps for being allowed to visit her sister. -1/23 spoke at length with  Hue (daughter) and I explained to her it appears [place] that given the multiple medical problems in her past with this new disease patient may have given up.  Question as to if patient would just wish to be made comfortable and be allowed to expire naturally.  Daughter was not sure at this point -1/24 palliative care consult;Patient has not been doing well for some time even prior to hospitalization now with Covid pneumonia overnight respiratory status significantly declined is now on high flow nasal cannula and nonrebreather, need to consult with family on changing patient to DNR vs comfort care vs hospice -1/24 comfort care -Jan 26, 80 despite excellent medical treatment patient continue to decline was made comfort care on 1/24  by her family and expired on 82/25.    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 Patient also has known atrial fibrillation hyperlipidemia among other things. Husband apparently is also positive for COVID-19. Her aunt has been her caretaker. She usually goes to the bathroom with a walker and they feed her. Family all around her. Patient was not vaccinated against COVID-19. She was brought in now with the shortness of breath and confusion. Was found to be COVID-19 positive and now requiring oxygen. Patient appears sick and unwell. Has low potassium today. Otherwise hemodynamically stable. Patient being admitted to the hospital with COVID-19 infection very symptomatic.  ED Course:Temperature 98.2 blood pressure 170/97, pulse 105 respirate 22 oxygen sat 97% on 2 L. White count 7.6.Hemoglobin 11.9 and platelets normal. Potassium is 3.1.Chloride 97 CO2 26. Glucose 135 and BUN 24. Creatinine is normal 0.63. Gap of 16. BNP 143 LDH 201 troponin is 33 with a delta of 5-5. Triglyceride 98 ferritin 1444 CRP 7.1 and lactic acid 1.3. COVID-negative screen is positive. Chest x-ray showed linear atelectasis versus scaring mildly. Patient being admitted with COVID 19 infection symptomatic.  Hospital Course:  See above   Time: 1155  Signed:  Carolyne Littles, MD [facility]

## 2021-01-09 NOTE — Progress Notes (Signed)
Nurse called into patients room by family, patient is not breathing and has no pulse.  Charge Nurse Carey Bullocks asked to come to room. Pronounced per MD order. Sharrell Ku RN

## 2021-01-09 DEATH — deceased
